# Patient Record
Sex: Male | Born: 1940
Health system: Southern US, Community
[De-identification: ages and names within clinical notes are randomized; demographics above are authoritative.]

## PROBLEM LIST (undated history)

## (undated) DIAGNOSIS — I10 Essential (primary) hypertension: Secondary | ICD-10-CM

## (undated) DIAGNOSIS — M359 Systemic involvement of connective tissue, unspecified: Secondary | ICD-10-CM

## (undated) DIAGNOSIS — I2510 Atherosclerotic heart disease of native coronary artery without angina pectoris: Secondary | ICD-10-CM

## (undated) DIAGNOSIS — E785 Hyperlipidemia, unspecified: Secondary | ICD-10-CM

## (undated) DIAGNOSIS — I4892 Unspecified atrial flutter: Secondary | ICD-10-CM

## (undated) DIAGNOSIS — C61 Malignant neoplasm of prostate: Secondary | ICD-10-CM

## (undated) DIAGNOSIS — M1990 Unspecified osteoarthritis, unspecified site: Secondary | ICD-10-CM

## (undated) HISTORY — DX: Hyperlipidemia, unspecified: E78.5

## (undated) HISTORY — PX: APPENDECTOMY: SHX54

## (undated) HISTORY — DX: Essential (primary) hypertension: I10

## (undated) HISTORY — DX: Atherosclerotic heart disease of native coronary artery without angina pectoris: I25.10

## (undated) HISTORY — DX: Malignant neoplasm of prostate: C61

## (undated) HISTORY — PX: TONSILLECTOMY: SUR1361

## (undated) HISTORY — DX: Unspecified atrial flutter: I48.92

## (undated) NOTE — *Deleted (*Deleted)
Pharmacy Student Note: For Learning Purposes ONLY. Not an active part of the chart.   Chief Complaint:  Patient presented to ED with worsening C. Diff infection associated diarrhea resulting in septic shock. While in ED, patient experienced a cardiac arrest in V. Fib which required 2 shocks, amiodarone, and intubation. PMH: CAD, atrial flutter, CKD, HTN, HLD, protstate cancer, hx CDI PSH: CABG 2000, NST 2019  Recommendations:   Admit Complaint: Anticoagulation- Heparin lvl  0.58 > cont 300 units/hr; A fib  Infectious Disease: CDI (fulminant; colitis)  -Prolonged course for fulminat, recurrent CDI Vancomycin oral solution (day 6 on 11/11 duration at least 10 days or longer depending on timing of history of CDI) at 500 mg daily Q6 hr and metronidazole 50 mg Q8HR -deciding on LOT today and will update stop dates -WBC increased today 13.7>20>29  Cardiovascular Maintain MAP > 65 11/9 MAPs in 70-80 range -Levophed and vasopressin running  -BP has not been charted today (if can maintain higher BP for IHD)  > 152 yest morning  Endocrinology: glucose control -Serum glucoses ~140-180 -gluc 217 11/11 AM  > D10 ?   Gastrointestinal / Nutrition -diarrhea induced metbaolic acidosis. Tx for C. Diff. -AST/ALT: 4 (4,000/1,400) > today 1.21 (1400/1220) > decr -LFT's increased > holding amiodarone > LFTs decr > restarted amio -11/10 small stool  Neurology: RASS -2 (declined)  Midazolam 2-4 mg PRN fentanyl 250 mcg/hr > 250 mcg/hr > 225 mcg/hr   Nephrology: severe oliguric ATN secondary to cardiac arrest and shock -dose meds for GFR -weight monitor daily -foley catheter -On CRRT (keep even, no a/c to start; Prismasol 4/2.5) > continuing UF 200 cc/hr because still volume overloaded on exam  Electrolytes: Prismasol 4/2.5 -Na 134 -K 4.6 > 4.9> 4.2 today -Mag 2.6 --> 2.5 > didn't get level today or yesterday -Phos  2.4 > 3.0 --- 20 mmol K-Phos Ca 8.1 Alb 2.8 - ionized Ca 1.05> gave 2 g  calcium  yest > 8.3>8.1       tx for Ca<1       Nonanion gap metabolic acidosis likely due to diarrhea Bicarb 12 --> 15.7 11/7 --> 22.6 Bicarb 125 cc/h --> decreased to 75 due to bicarb 24 11/7 --> continue to montior  Hyponatremia/hypochloremia due to free water retention and loss from diarrhea -Na 129 -- low --> 132 11/8 (better) > 135 improving (prismasol)> 133 -Cl 94 --> 100 > 102 increased again today >102    Hypoalbuminemia  Albumin 1.8 (increased) > 2.0 -25 g 25% albumin replaced today TID   PTA Medication Issues Acetaminophen 325 mg take 2 tabs Q6HR for back pain Eliquis 5 mg 1 tab BID (time of last dose before presentation to ED unknown) Rosuvastatin 40 mg 1 tab after supper B complex vitamins 1 capsule daily Calcium-Magnesium-VitD (Ca 1200+D3) 1 tab daily Cholecalciferol 25 mcg 1000 units- take 10,000 units daily Flonase 1 spray daily Metoprolol tartrate 25 mg take 3 tabs (75 mg) BID MVM NTG 0.4 mg Prednisone 5 mg BID with meal (for prostate cancer) Triamcinolone cream 0.1% Zytiga 250 mg take 4 tablets QAM   Inpatient meds Aspirin 162 mg daily per tube Chlorhexidine gluconate 15 mL BID D10 Heparin 5,000 units  Hydrocortisone sodium succinate IV injection 50 mg Q6HR --> renal dosing ok Insulin sSSI Insulin 3 units Q4HR Medline mouth rinse Pantoprazole 40 mg Q24 hours IV Vancomycin 500 mg/mL oral solution   Continuous meds prismasol 4/2.5 infusion NS infusion 10-20 mL/hr Albumin 25 g --> D/Cd Amiodarone 30  mg/hr Calcium gluconate --> D/Cd Dobutamine --> D/Cd Fentanyl 2500 mcg 10 mcg/mL Metronidazole 500 mg Q8HR --> stopped 11/5 1000 Norepinephrine 16 mg in 250 mL Zosyn --> D/Cd Sodium bicarb in D5W  PRN meds Midazolam 2-5 mg    Best Practices    Elliot Gurney, Lucienne Minks PharmD Student

## (undated) NOTE — *Deleted (*Deleted)
Pharmacy Resident Rounding Note - for learning purposes only, not an active part of the chart  S/o  Admit Complaint: Hypoxic resp failure req mechanical ventilation, septic shock d/t c.diff -started CRRT on 11/7 for AKI  -300 net UF  -consider d/c or reducing flagyl by 50% for liver dysfxn   Anticoagulation - heparin gtt Infectious Disease -vancomycin PO for cdiff day 4 Cardiovascular - amio gtt d/cd (elevated LFTs) - now in NSR  -vasopressin @0 .03, NE gtt @5 . MAP 79, SBP 100-120, HR 90s  Endocrinology - d10 Gastrointestinal / Nutrition-TF  -AST/ALT elevated, 4400/1400 Neurology -fent gtt @250 >>225 Nephrology CRRT - 4/2.5  -5.7L past 24 hours, Net +16L No UOP Hgb ~11, plt 179  Pulmonary Hematology / Oncology PTA Medication Issues Best Practices  A/p  AKI requiring CRRT -f/u fluid removal, watch lytes  -correct calcium? Add bicarb?  C.diff colitis / septic shock -f/u vasopressor wean -cont abx   Intermittent afib -in NSR, amio dcd -cont hep, may need to go to cath

---

## 1999-01-30 HISTORY — PX: CORONARY ARTERY BYPASS GRAFT: SHX141

## 1999-02-10 ENCOUNTER — Inpatient Hospital Stay (HOSPITAL_COMMUNITY): Admission: EM | Admit: 1999-02-10 | Discharge: 1999-02-16 | Payer: Self-pay | Admitting: Emergency Medicine

## 1999-02-12 ENCOUNTER — Encounter: Payer: Self-pay | Admitting: Cardiovascular Disease

## 1999-02-13 ENCOUNTER — Encounter: Payer: Self-pay | Admitting: Cardiovascular Disease

## 1999-02-14 ENCOUNTER — Encounter: Payer: Self-pay | Admitting: Cardiothoracic Surgery

## 1999-03-05 ENCOUNTER — Inpatient Hospital Stay (HOSPITAL_COMMUNITY): Admission: AD | Admit: 1999-03-05 | Discharge: 1999-03-08 | Payer: Self-pay | Admitting: Cardiovascular Disease

## 1999-03-06 ENCOUNTER — Encounter: Payer: Self-pay | Admitting: Cardiovascular Disease

## 2001-07-11 ENCOUNTER — Other Ambulatory Visit: Admission: RE | Admit: 2001-07-11 | Discharge: 2001-07-11 | Payer: Self-pay | Admitting: Urology

## 2002-08-01 HISTORY — PX: PROSTATECTOMY: SHX69

## 2003-04-01 ENCOUNTER — Other Ambulatory Visit: Admission: RE | Admit: 2003-04-01 | Discharge: 2003-04-01 | Payer: Self-pay | Admitting: Urology

## 2003-04-30 ENCOUNTER — Ambulatory Visit (HOSPITAL_COMMUNITY): Admission: RE | Admit: 2003-04-30 | Discharge: 2003-04-30 | Payer: Self-pay | Admitting: Radiation Oncology

## 2003-05-05 ENCOUNTER — Encounter: Payer: Self-pay | Admitting: Urology

## 2003-05-05 ENCOUNTER — Ambulatory Visit (HOSPITAL_COMMUNITY): Admission: RE | Admit: 2003-05-05 | Discharge: 2003-05-05 | Payer: Self-pay | Admitting: Urology

## 2003-06-04 ENCOUNTER — Inpatient Hospital Stay (HOSPITAL_COMMUNITY): Admission: RE | Admit: 2003-06-04 | Discharge: 2003-06-10 | Payer: Self-pay | Admitting: Urology

## 2003-07-18 ENCOUNTER — Emergency Department (HOSPITAL_COMMUNITY): Admission: EM | Admit: 2003-07-18 | Discharge: 2003-07-18 | Payer: Self-pay | Admitting: *Deleted

## 2003-09-15 ENCOUNTER — Other Ambulatory Visit: Admission: RE | Admit: 2003-09-15 | Discharge: 2003-09-15 | Payer: Self-pay | Admitting: Dermatology

## 2004-11-17 ENCOUNTER — Ambulatory Visit (HOSPITAL_COMMUNITY): Admission: RE | Admit: 2004-11-17 | Discharge: 2004-11-17 | Payer: Self-pay | Admitting: Internal Medicine

## 2004-11-17 ENCOUNTER — Ambulatory Visit: Payer: Self-pay | Admitting: Internal Medicine

## 2005-06-30 ENCOUNTER — Ambulatory Visit (HOSPITAL_COMMUNITY): Admission: RE | Admit: 2005-06-30 | Discharge: 2005-06-30 | Payer: Self-pay | Admitting: Cardiovascular Disease

## 2005-10-18 ENCOUNTER — Encounter (HOSPITAL_COMMUNITY): Admission: RE | Admit: 2005-10-18 | Discharge: 2005-11-17 | Payer: Self-pay | Admitting: *Deleted

## 2005-11-03 ENCOUNTER — Encounter (HOSPITAL_COMMUNITY): Admission: RE | Admit: 2005-11-03 | Discharge: 2006-02-01 | Payer: Self-pay | Admitting: *Deleted

## 2005-11-15 ENCOUNTER — Ambulatory Visit: Admission: RE | Admit: 2005-11-15 | Discharge: 2006-01-24 | Payer: Self-pay | Admitting: *Deleted

## 2007-10-29 ENCOUNTER — Inpatient Hospital Stay (HOSPITAL_COMMUNITY): Admission: EM | Admit: 2007-10-29 | Discharge: 2007-10-31 | Payer: Self-pay | Admitting: Emergency Medicine

## 2007-10-30 ENCOUNTER — Encounter (INDEPENDENT_AMBULATORY_CARE_PROVIDER_SITE_OTHER): Payer: Self-pay | Admitting: General Surgery

## 2010-12-14 NOTE — Discharge Summary (Signed)
NAME:  Samuel Gregory, BARREN NO.:  [id number]   MEDICAL RECORD NO.:  [id number]          PATIENT TYPE:  INP   LOCATION:  A314                          FACILITY:  [facility]   PHYSICIAN:  Tilford Pillar, MD      DATE OF BIRTH:  09-Dec-1940   DATE OF ADMISSION:  10/29/2007  DATE OF DISCHARGE:  04/01/2009LH                               DISCHARGE SUMMARY   ADMISSION DIAGNOSES:  Acute appendicitis.   DISCHARGE DIAGNOSIS:  1. Status post laparoscopic appendectomy.  2. Urethral stricture status post urethral dilatation and Foley      catheter placement per Dr. Jerre Simon.  3. Coronary artery disease.  4. Hypertension.  5. History of prostate cancer.   ADMITTING SURGEON:  Dr. Tilford Pillar.   CONSULTATIONS:  1. Dr. Jerre Simon.   PROCEDURES:  1. Laparoscopic appendectomy.  2. Urethral dilatation and Foley catheter placement.   DISPOSITION:  Home.   BRIEF H&P:  Please see the admission history and physical for complete  H&P.  The patient is a 70 year old male who presented to [facility] with increasing abdominal pain with localization to the right  lower quadrant.  He was evaluated initially by the emergency room  physician who obtained a CT evaluation, which confirmed the presence of  acute appendicitis.  He was admitted for the planned management and her  intervention of his acute appendicitis.   HOSPITAL COURSE:  The patient was admitted on October 29, 2007 near  midnight.  He was resuscitated with IV fluid hydration and IV  antibiotics.  He was subsequently taken to the operating room for the  planned laparoscopic, possible open appendectomy.  Due to a urethral  stricture Foley catheter why need was placed by consultation from  urology.  He tolerated both procedures extremely well.  Spent a brief  period in the postanesthetic care unit and was transferred back to the  his hospital bed on 3A.  His recovery was uneventful.  He tolerated a  regular diet, ambulatory.  Pain  was controlled with minimal use of  narcotics.  Foley catheter was removed on the morning of April 1.  Upon  voiding the patient was discharged home.   DISCHARGE INSTRUCTIONS:  The patient is to follow up in my office in 3  weeks.  He is to follow up with Dr. Jerre Simon on an as-needed basis.  He is  instructed to continue a regular heart healthy diet.   WOUND CARE:  He is to wash the wounds with soap and water.  He may  shower.  He is not to bathe for the next 2 to 3 weeks.  He may increase  activity slowly.  He may walk up steps.  He is not to lift anything  greater than 20 pounds for the next 4 weeks.  He is not to drive while  taking any narcotic pain medication or if he does not have significant  comfort to drive.   DISCHARGE MEDICATIONS:  1. He is to resume all previously prescribed home medications      including Vytorin 10/80 mg p.o.  daily, Altace 5 mg p.o. daily,      aspirin 325 mg p.o. daily, Toprol XL 12.5 mg p.o. daily,      multivitamin 1 tablet p.o. daily, flaxseed oil 1 tablet p.o. daily.  2. Additionally, he is being prescribed Vicodin 5/500 one to two      orally every 4 hours as needed for pain.      Tilford Pillar, MD  Electronically Signed     BZ/MEDQ  D:  10/31/2007  T:  10/31/2007  Job:  [id number]   cc:   [facility]

## 2010-12-14 NOTE — Op Note (Signed)
NAME:  [name], [name] NO.:  [id number]   MEDICAL RECORD NO.:  [id number]          PATIENT TYPE:  INP   LOCATION:  A314                          FACILITY:  [facility]   PHYSICIAN:  Ky Barban, M.D.DATE OF BIRTH:  03-Sep-1940   DATE OF PROCEDURE:  10/29/2007  DATE OF DISCHARGE:                                PROCEDURE NOTE   I was called in the operating room to see this patient.  Mr. Samuel Gregory  is a 70 year old gentleman well known to me.  He had radical retropubic  prostatectomy for carcinoma of the prostate and followed with  radiotherapy.  He is doing well from that standpoint.  His PSA is  negligible.  He also has a history of having developing bladder neck  stenosis after his surgery.  Today, he is here with acute appendicitis,  and they are trying to put a Foley catheter and cannot get in, so I was  called in to put a Foley catheter.  Urologically, there are no other  acute changes.   IMPRESSION:  Probably bladder neck stenosis.  Carcinoma of prostate  history.  I will go ahead and insert a Foley catheter after dilating the  bladder neck.   PREOP DIAGNOSIS:  Bladder neck stenosis.   POSTOPERATIVE DIAGNOSIS:  Bladder neck stenosis.   PROCEDURE:  Dilation of bladder neck with Sissy Hoff sounds, insertion of  a #18 Foley catheter.   PROCEDURE:  The patient has already general anesthesia after having a  laparoscopic appendectomy.  I came in and prepped and draped the private  area, and Xylocaine jelly was instilled into the urethra.  I tried a #18  Foley catheter.  It would not go in, so I used [employer].  Bladder neck dilated to a 22 [place] without any difficulty.  Then, I was  able to insert a #18 Foley catheter.  It is draining clear amber-colored  urine.  Foley catheter was left in, and at this point I left the  operating room.      Ky Barban, M.D.  Electronically Signed     MIJ/MEDQ  D:  10/30/2007  T:  10/30/2007  Job:   [id number]

## 2010-12-14 NOTE — Op Note (Signed)
NAME:  Samuel Gregory, Samuel Gregory NO.:  [id number]   MEDICAL RECORD NO.:  [id number]          PATIENT TYPE:  INP   LOCATION:  A314                          FACILITY:  [facility]   PHYSICIAN:  Tilford Pillar, MD      DATE OF BIRTH:  [birth year]   DATE OF PROCEDURE:  10/31/2007  DATE OF DISCHARGE:                               OPERATIVE REPORT   PREOPERATIVE DIAGNOSES:  Acute appendicitis.   POSTOPERATIVE DIAGNOSES:  1. Acute appendicitis.  2. Urethral stricture.   PROCEDURE:  1. Laparoscopic appendectomy.  2. Urethral dilatation and Foley catheter placement per Dr. Ky Barban.   SURGEON:  Dr. Tilford Pillar.   INTRAOPERATIVE SURGICAL CONSULTATION:  Dr. Ky Barban, for  urologic consultation.   ANESTHESIA:  General, local anesthetic with 0.5% Sensorcaine plain.   SPECIMEN:  Appendix.   ESTIMATED BLOOD LOSS:  Minimal.   COMPLICATIONS:  None.   INDICATIONS:  The patient is a 70 year old male who presented to [facility] with increasing abdominal pain with localization to the  right lower quadrant.  He did have a CT evaluation obtained by the  emergency room physician which demonstrated a dilated appendix with a  fecalith present.  He was prepared for the OR, resuscitated, risks,  benefits and alternatives of a laparoscopic, possible open appendectomy  were discussed at length with the patient including but not limited to  the risk of bleeding, infection, bowel injury as well as the possibility  of intraoperative pulmonary or cardiac episodes.  The likelihood of  conversion to an open procedure was discussed also with the patient  secondary to his history of prior radical prostatectomy.  The patient  understood the risks and benefits and wish to proceed at this time to  the operating room for an emergent appendectomy.   OPERATION:  The patient was taken emergently to the operating room for  the planned procedure.  He was placed in the supine  position on the  hospital bed at which time he was given a general anesthetic.  Once the  patient was asleep he was endotracheally intubated by Anesthesia.  It  should be noted that due to the presence of a anterior airway, the Glide  scope was required for successful intubation.  No difficulties were  obtained, using this technique.  Once the patient was asleep and  intubated, Foley catheter placement was attempted by myself using  sterile technique.  At this point a urethral stricture was encountered.  Several attempts were made with both a regular Foley catheter as well as  with a coude catheter, both 16-French and 18-French with no success.  At  this point additional attempts were ceased and a consultation with  Urology with Dr. Jerre Simon was obtained.  At this point the patient's  abdomen was prepped and draped in the usual fashion.  Due to the fact  that a Foley catheter was not able to be placed, plans were for a high  approach to the appendectomy.  Therefore a stab incision was created in  the  left subcostal region at Palmer's point.  The Veress needle was  inserted.  Once sufficient pneumoperitoneum was obtained, a 12 mm trocar  was placed in the midline approximately halfway between the xiphoid and  the umbilicus.  This was placed creating a stab incision and placement  of the trocar over the laparoscope allowing visualization of the trocar  entering into the peritoneal cavity.  Upon entering into the peritoneal  cavity, the inner cannula was removed.  The laparoscope was reinserted.  There is no evidence of any trocar or Veress needle placement injury.  The Veress needle was removed at this point.  At this time the remaining  trocars were placed.  A 5 mm trocar was placed just inferior to the  umbilicus, a 10 mL trocar was placed on the left lateral abdominal wall.  Both these trocars were placed in a similar fashion, creating a stab  incision and visualizing the trocar entering  the peritoneal cavity with  the laparoscope.  At this point it was noted there were a significant  number of adhesions within the peritoneal cavity.  The cecum was easily  identified.  There were some adhesions of the cecum to the anterior  abdominal wall.  These were divided sharply with Endoshears allowing  manipulation of the cecum.  The appendix was clearly identified.  It was  clearly indurated.  This was grasped with a regular grasper and lifted  anteriorly towards the abdominal wall allowing visualization of the  junction of the appendix and cecum.  A window was created in the base  appendix with a [place] dissector at the base of the appendix.  With  this window created an Endo vascular GIA 45 mm stapler was brought to  the field and was utilized to divide the mesoappendix.  At this point  the staple line was evaluated.  Hemostasis was excellent and attention  was turned to division of the base of the appendix.  Using a reload with  a regular Endo-GIA 45 stapler with a blue load, the base of the appendix  was divided.  The appendix was then free at this point.  This was placed  in an EndoCatch bag.  This was removed through the 12 mm trocar site.  At this point the trocar was reinserted at the 12 mm trocar site and  using a Endoclose suture passing device, a 2-0 Vicryl was passed through  both the 12 and the 11 mm trocar sites.  With these two  sutures in  place, the pneumoperitoneum was evacuated after once again visualizing  the staple lines and ensuring adequate hemostasis.  Both staple lines  were noted to be dry and at this time the pneumoperitoneum was  evacuated.  Trocars were removed.  The Vicryl sutures were secured.  Skin edges were reapproximated with a 4-0 Monocryl in a running  subcuticular suture at all three trocar sites.  The skin was then washed  and dried with a moist and dry towel.  Local anesthetic was instilled  into all around the area of all three trocar  sites and Dermabond was  placed over the incision.  At this point the initial drapes were removed  and attention was turned to placement of Foley catheter.   At this point intraoperative consultation by Dr. Jerre Simon was obtained.  Please see his dictation for the complete description of the procedure.  The penis was prepped again with the Betadine solution.  Drapes were  replaced and using sterile technique, urethral  dilators were utilized to  redilate the urethra and bladder neck.  A Foley catheter was placed  without difficulties upon completion of the dilatation.  Good urine  return was obtained.  The drapes were  removed.  The patient was allowed to come out of general anesthetic, was  transferred to a regular hospital bed and transferred to the  postanesthetic care unit in stable condition.  At the conclusion of the  procedure all instrument, sponge and needle counts were correct.  The  patient tolerated both procedures well.      Tilford Pillar, MD  Electronically Signed     BZ/MEDQ  D:  10/31/2007  T:  10/31/2007  Job:  [id number]   cc:   Ky Barban, M.D.  Fax: [phone]   [facility]

## 2010-12-14 NOTE — H&P (Signed)
NAME:  Samuel Gregory, Samuel Gregory NO.:  [id number]   MEDICAL RECORD NO.:  [id number]          PATIENT TYPE:  INP   LOCATION:  A314                          FACILITY:  [facility]   PHYSICIAN:  Tilford Pillar, MD      DATE OF BIRTH:  09/07/1940   DATE OF ADMISSION:  10/29/2007  DATE OF DISCHARGE:  LH                              HISTORY & PHYSICAL   CHIEF COMPLAINT:  Acute abdominal pain.   HISTORY OF PRESENT ILLNESS:  The patient is a 70 year old male with a  history of coronary artery disease, hypertension, and history of  prostate cancer who presents with approximately 2 days of increasing  abdominal pain.  This started insidiously in a diffuse fashion with  localization to the right lower quadrant as of the day progressed on  March 30.  He has had no similar symptoms.  He has had no sick contacts.  The pain has no significant exacerbating or relieving factors noted.  He  denies any current nausea and vomiting.  Questionable fever and chills.  No change in bowel movements, no hematochezia, no melena.  He has had no  dysuria, no hematuria.  Currently on evaluation his pain is relatively  controlled.   PAST MEDICAL HISTORY:  1. Coronary artery disease.  2. Hypertension.  3. Prostate cancer.   PAST SURGICAL HISTORY:  1. CABG.  2. Stents placed in 2004.  3. Radical prostatectomy.   MEDICATIONS:  1. Vytorin 1 mg p.o. daily.  2. Altace 5 mg p.o. daily.  3. Toprol XL 12.5 mg p.o. daily.  4. Aspirin 325 mg p.o. daily.   ALLERGIES:  SULFA which causes a rash.   SOCIAL HISTORY:  No tobacco use.  Occasional alcohol use.  No  recreational drug use.   REVIEW OF SYSTEMS:  CONSTITUTIONAL:  Unremarkable.  EYES:  Unremarkable.  EARS, NOSE, AND THROAT:  Unremarkable.  PULMONARY:  Unremarkable.  CARDIOVASCULAR:  No chest pain, no palpitations.  GASTROINTESTINAL:  As  per HPI.  GENITOURINARY: Unremarkable.  MUSCULOSKELETAL:  Unremarkable.  SKIN:  Unremarkable.  NEUROLOGIC:   Unremarkable.   PHYSICAL EXAMINATION:  VITAL SIGNS:  Temperature 98.7, T-max of 101.9,  heart rate 61, respirations 20, blood pressure 104/66.  GENERAL:  The patient is lying supine in a hospital bed in no acute  distress.  He is alert and oriented x3.  HEENT:  Pupils are equal, round, reacted to light.  Extraocular  movements are intact.  Oral mucosa is pink and moist.  NECK:  Trachea is midline.  No cervical lymphadenopathy is appreciated.  No thyroid nodules are noted.  PULMONARY:  Unlabored respirations.  He is clear to auscultation  bilaterally.  No wheezes are appreciated.  CARDIOVASCULAR:  Regular rate and rhythm.  He has 2+ radial pulses  bilaterally.  ABDOMEN:  Soft, flat.  He does have right lower quadrant pain at  McBurney's point. He does what appears to be Rovsing sign with referred  pain to the right lower quadrant with palpation of the left side. He has  no diffuse peritoneal signs. No hernias or masses  are elicited.  EXTREMITIES:  Warm and dry.   PERTINENT LABORATORY RADIOGRAPHIC STUDIES:  CBC:  White blood cell count  7.3, hemoglobin 13.0, hematocrit 36.7, platelets 138.  Basic metabolic  panel: Sodium 136, potassium 3.9, chloride 102, bicarb 28, BUN 12,  creatinine 1.20, blood glucose 166.  UA is within normal limits.  CT of  the abdomen and pelvis demonstrates no evidence of free air or free  intraperitoneal fluid.  There is some fat straining in the right lower  quadrant around the pericecal region.  There also appears to be a fluid-  filled, dilated appendix with the presence of a fecalith within this.  No evidence of intraperitoneal rupture is noted.   ASSESSMENT AND PLAN:  Acute appendicitis.  At this point the risks,  benefits, and alternatives of a laparoscopic, possible open appendectomy  were discussed at length with the patient; including, but not limited to  the risk of bleeding, infection, staple line leak, as well as the  possibility of intraoperative  pulmonary cardiac episodes.  With the  patient's significant history of pelvic surgery including his  prostatectomy, it was discussed with the patient that there is a higher  chance of need for conversion.  However, attempts at laparoscopic  pressure are still being initiated with placement of the laparoscope and  evaluation of the abdomen for evaluation of scar tissue.  It was  discussed with the patient, however, that if it was not deemed safe or  possible to do it through the laparoscope, conversion to an open  appendectomy will be undertaken.  At this point continued IV fluid  hydration as well as continued IV antibiotics with Nuvance will be  continued as well as NPO status.      Tilford Pillar, MD  Electronically Signed     BZ/MEDQ  D:  10/30/2007  T:  10/30/2007  Job:  [id number]

## 2010-12-17 NOTE — Discharge Summary (Signed)
NAME:  Samuel Gregory, Samuel Gregory NO.:  [id number]   MEDICAL RECORD NO.:  [id number]                   PATIENT TYPE:  INP   LOCATION:  A319                                 FACILITY:  [facility]   PHYSICIAN:  Ky Barban, M.D.            DATE OF BIRTH:  [birth year]   DATE OF ADMISSION:  06/04/2003  DATE OF DISCHARGE:  06/10/2003                                 DISCHARGE SUMMARY   Samuel Gregory is a 70 year old gentleman with elevated PSA.  On needle biopsy  of the prostate is found to have carcinoma of the prostate and he is found  to have a high grade carcinoma of the prostate with a Gleason's score of 8.  His total PSA is below 10, it is only 6.2, so I have advised him to undergo  radical prostatectomy, although we discussed other alternative treatment.  He had radiotherapy consult.  The patient decided to have radical  prostatectomy, so the procedure, risks, complications, especially urinary  incontinence, erectile dysfunction and blood transfer was discussed.  He  agreed and underwent preoperative workup.  CBC, urinalysis, SMA7, and EKG  were done, which were essentially negative, within normal range so he was  taken to the operating room.  On June 04, 2003, underwent bilateral  pelvic node dissection with frozen sections which were negative.  A  retropubic prostatectomy was done.  During the procedure there was a rectal  puncture which was repaired, and the procedure was done under spinal and  general anesthesia.   ESTIMATED BLOOD LOSS:  1200 cc which was replaced with two units.   First postop day, the patient is in ICU for routine observation.  His blood  pressure 138/82, pulse 100 per minute, temperature 98.3.  SUMP is 50 cc.  Foley catheter drained 200 cc, ureteral catheter about 50 cc and NG output  is 25 cc in the last two hours when I came to see him at 7:00 in the  morning.  His postoperative course is benign, satisfactory, and the first  postop day  he is afebrile, hematocrit 34.4, WBC count 8.4, sodium 137,  potassium 4, chloride 107, CO2 is 29, BUN is 11, creatinine is 1.2.  Blood  pressure 147/86, temperature 102.9, pulse is 100.  Dressing is changed,  wound looks fine.  He had a satisfactory postop course but he needs  incentive spirometry.  A rise in temperature is from atelectasis so I  encouraged him to start using incentive spirometry.  Patient is also being  closely followed by his medical doctor, Dr. [name].  On the second postop day  his abdomen soft, bowel sounds are present but no flatus, dressing is clear,  intake 3496 cc, output 3870 cc.  SUMP is right, NG output 0, so we placed  him on out-of-bed NG.  He continued to do well and on June 07, 2003, I  discontinued his NG tube,  started him on a liquid diet.  Ureteral catheters  were discontinued and he was discharged from ICU.  Also, his SUMP was  discontinued.  On June 08, 2003, he is afebrile, his general status is  good, passing flatus, abdomen soft.  He is on a regular diet.  On June 09, 2003, general status is good, wound is healing up nicely.  Had bowel  movement.  IV was discontinued.  On June 10, 2003, temperature the night  before was 100.4, he was afebrile at that time so we are going to discharge  him home with Foley catheter and will take the stitches out in the office  along with Foley catheter.  I want to keep the Foley catheter for about two  more weeks.  His final path report is back.  Lymph nodes, as I said, were  negative from both sides, both obturator and pelvic, and the specimen of  prostate and seminal vesicles shows Gleason's score is 3+3=6.  This is  notable because his biopsy Gleason score was 8.  Tumor extending to through  the capsule margin.  Tumor is not present on the ink margin of the urethra  or bladder or vesicle.  Seminal vesicles are uninvolved.  T2C, N0, MX.   FINAL DIAGNOSIS:  Carcinoma prostate.   DISCHARGE CONDITION:   Improved.  He is being discharged on his usual  medication which is Toprol, Lipitor, Altace and Levaquin plus Percocet.  I  will see him back next week in the office to remove the stitches.                                                Ky Barban, M.D.    MIJ/MEDQ  D:  07/29/2003  T:  07/29/2003  Job:  [id number]   cc:   [name]. [name], M.D.  [address]  [place]  [place] [address]  Fax: [phone]

## 2010-12-17 NOTE — H&P (Signed)
NAME:  Samuel Gregory, Samuel Gregory NO.:  [id number]   MEDICAL RECORD NO.:  [id number]                   PATIENT TYPE:  AMB   LOCATION:  DAY                                  FACILITY:  [facility]   PHYSICIAN:  Dennie Maizes, M.D.                DATE OF BIRTH:  11-15-1940   DATE OF ADMISSION:  06/05/2003  DATE OF DISCHARGE:                                HISTORY & PHYSICAL   CHIEF COMPLAINT:  Carcinoma of the prostate.   HISTORY OF PRESENT ILLNESS:  This 70 year old male was referred to me from  [facility] in 2000 with an elevated PSA of 4.3.  With  antibiotic therapy his PSA came down to normal range.  His PSA was again  elevated in 2002. Biopsy at that time revealed no evidence of malignancy.  Recent evaluation in the office revealed a prostate nodule with an elevated  PSA of 6.23 ng/ml.  The patient underwent transrectal ultrasound and 12-core  biopsy of the prostate in the office.  This revealed high grade carcinoma of  the prostate with a Gleason score of 5+3=8.  Metastatic workup was negative.   The patient denies having any voiding difficulty, gross hematuria,  urolithiasis, or recurrent urinary tract infection.  He has a good urinary  stream and urinary frequency x 4-5 and nocturia x0-1.   PAST MEDICAL HISTORY:  1. Coronary artery disease status post coronary artery bypass grafting x4 in     2000.  2. Hypercholesterolemia.   MEDICATIONS:  1. Toprol XL 25 mg, one p.o. daily.  2. Lipitor 40 mg, one p.o. daily.  3. Altace 2.5 mg, one p.o. daily.  4. Aspirin 325 mg, one p.o. daily, which has been discontinued for the     surgery.   ALLERGIES:  SULFA.   FAMILY HISTORY:  Family history is positive for coronary artery disease.  His mother had mesothelioma of the chest.   PHYSICAL EXAMINATION:  HEENT:  Head, eyes, ears and throat normal.  NECK:  No masses.  LUNGS:  Lungs clear to auscultation.  HEART:  Regular rate and rhythm.  No  murmurs.  ABDOMEN:  Abdomen soft, no palpable flank masses.  No CVA tenderness.  Bladder not palpable.  GENITALIA:  Penis and testes are normal.  RECTAL:  There is a 36 g size benign prostate with firmness and nodularity  of the right lateral lobe.   IMPRESSION:  Carcinoma of the prostate.   PLAN:  I have discussed with the patient regarding the diagnosis and  management options.  The patient has seen Dr. Dayton Scrape in consultation.  He  has decided to undergo radical prostatectomy.  The patient was seen by Dr.  Jerre Simon in my presence and he has been counseled regarding radical  prostatectomy and possible complications.  Please refer to Dr. Rudean Haskell  dictated notes.     ___________________________________________  Dennie Maizes, M.D.   SK/MEDQ  D:  06/03/2003  T:  06/03/2003  Job:  [id number]   cc:   [name]. [name], M.D.  [address]  [place]  [place] [address]  Fax: [phone]

## 2010-12-17 NOTE — Op Note (Signed)
NAME:  Samuel Gregory, Samuel Gregory                  ACCOUNT NO.:  [id number]   MEDICAL RECORD NO.:  [id number]          PATIENT TYPE:  AMB   LOCATION:  DAY                           FACILITY:  [facility]   PHYSICIAN:  Lionel December, M.D.    DATE OF BIRTH:  11-19-1940   DATE OF PROCEDURE:  11/17/2004  DATE OF DISCHARGE:                                 OPERATIVE REPORT   PROCEDURE:  Colonoscopy.   INDICATION:  Samuel Gregory is a 70 year old Caucasian male who is here for screening  colonoscopy. Family history is negative for colorectal carcinoma. Procedures  were reviewed the patient, informed consent was obtained.   PREMEDICATION:  Demerol 50 milligrams IV, Versed 5 milligrams IV.   FINDINGS:  Procedure performed in endoscopy suite. The patient's vital signs  and O2 sat were monitored during procedure and remained stable. The patient  was placed left lateral position and rectal examination performed. No  abnormality noted on external or digital exam. Olympus videoscope was placed  in rectum and advanced under vision into sigmoid colon beyond. Preparation  was satisfactory. Somewhat redundant colon but by repeatedly withdrawing the  scope and using abdominal pressure was able to advance the scope into the  splenic flexure beyond. The scope was passed to cecum which was identified  by appendiceal orifice and ileocecal valve. Pictures taken for the record.  Valve was rather inconspicuous. As the scope was withdrawn colonic mucosa  was carefully examined. There was questionable polyp across and above  ileocecal valve.  The fold in this area was separated with cold biopsy  forceps and no polyp was noted.  Mucosa rest of the colon similarly was  normal. Rectal mucosa was also normal. Scope was retroflexed to examine  anorectal junction and hemorrhoids were noted above the dentate line.  Endoscope was straightened and withdrawn. The patient tolerated the  procedure well.   FINAL DIAGNOSIS:  Internal hemorrhoids,  otherwise normal colonoscopy.   RECOMMENDATIONS:  Standard instructions given.  Yearly Hemoccults. He may  consider next screening exam in 10 years from now.      NR/MEDQ  D:  11/17/2004  T:  11/17/2004  Job:  [id number]   cc:   Kirk Ruths, M.D.  [address]  [place]  [place] [address]  Fax: [phone]

## 2010-12-17 NOTE — Op Note (Signed)
NAME:  Samuel Gregory, Samuel Gregory NO.:  [id number]   MEDICAL RECORD NO.:  [id number]                   PATIENT TYPE:  INP   LOCATION:  IC06                                 FACILITY:  [facility]   PHYSICIAN:  Ky Barban, M.D.            DATE OF BIRTH:  Jan 16, 1941   DATE OF PROCEDURE:  06/04/2003  DATE OF DISCHARGE:                                 OPERATIVE REPORT   PREOPERATIVE DIAGNOSIS:  Carcinoma of the prostate.   POSTOPERATIVE DIAGNOSIS:  Carcinoma of the prostate.   PROCEDURES:  1. Bilateral pelvic lymph node dissection with frozen sections.  2. Radical retropubic prostatectomy.  3. Repair of rectal tear.   ANESTHESIA:  Spinal plus general.   SURGEON:  Ky Barban, M.D.   ASSISTANT:  Dennie Maizes, M.D.   ESTIMATED BLOOD LOSS:  1200 mL.   REPLACEMENT:  Two units of packed cells.   COUNTS:  Instrument, needle, sponge count correct.   PATHOLOGY:  Frozen sections negative for metastatic disease.   DESCRIPTION OF PROCEDURE:  The patient was given general endotracheal  anesthesia along with spinal anesthesia, placed in semilithotomy position,  with the usual prep and drape. A 20-Foley catheter was introduced into the  bladder, appropriate midline incision is made, carried down through the  subcutaneous tissue. The rectus sheath is incised, recti separated in the  midline and retropubic space is entered. The right and left external iliac  veins were exposed.  A self-retaining Turner-Warwick retractor was applied.  I proceeded to do a node dissection on the right side, the fascia around the  external iliac vein is opened and the tissue is developed between the  external iliac vein and the obturator nerve. The fascia was skeletonized  along with the obturator nerve and vessels towards the bifurcation of the  right common iliac vein. The lymphatics were clipped and divided and  specimen was collected, sent for frozen section and this area was  left  bagged.   Similarly the nodes were dissected out on the left side and the report came  back from both sides negative for any metastatic disease.  We proceed to do  the radical prostatectomy. Working in the retropubic space, the  puboprostatic ligaments were divided and the pelvic fascia was opened on  both sides of the midline and through this endopelvic fascia, then a 2-0  Vicryl stitch was placed in the dorsal vein complex as distal as possible  and a second stitch was placed at the level of the apex of the prostate and  dorsal vein complex was divided.   The urethra was exposed and under direct vision a right angle was passed  behind the urethra and the interior wall of the urethra is divided. The  catheter was grabbed with the help of a long hemostat and divided and then  under direct vision the posterior wall of the urethra was divided.  The  traction on the Foley catheter lifted up the apex gently and pushed the  rectum along with the neurovascular bundles away from the prostate. This  area was then left bagged and the prostate was picked up more so that the  look on the posterior surface of the prostate and Denonvilliers fascia was  opened up at the level of the seminal vesicles. At this point, I find that  the rectum is very adherent to this area and I thought the seminal vesicles  were attached to the rectum along with the Denonvilliers fascia with blunt  dissection I made a small hole in the rectum which was immediately repaired  in two layers using 3-0 Vicryl and at the end of the case a third fascial  layer was placed with a continue stitch of 3-0 Vicryl covering this rectal  tear. At that time, the assistant had a finger in the rectum. Then the  seminal vesicles were identified, the fascia covering the seminal vesicle  was opened. The Visual Analog Scale deferens were identified, they were  clipped and divided. The seminal vesicles were quite adherent to the   surrounding sheath and with blunt and sharp dissection I was able to get  them out of the sheath by clipping the seminal vesicle artery on both of  them.   Once this dissection was completed, the superior pedicle of the prostate  first the right and then the left was ligated and divided. Now the bladder  neck was opened up in the interior midline, the bladder was opened and the  ureteral orifices were identified. A left #5 visceral tip catheter placed on  both and stabilized the ureteral catheters to the bladder with 4-0 chromic  stitch. Now the posterior bladder neck was divided under direct vision and  the remaining part of the seminal vesicle sheath from this side was divided  but both seminal vesicles also were divided during this procedure and the  specimen came out. Then I went back and removed the seminal vesicle  separately and the operative site was thoroughly irrigated, bleeders were  coagulated and clipped. There was good hemostasis. The bladder neck was then  fashioned by closing on the sides with 2-0 Vicryl stitch leaving it open the  size of my index finger. Once the bladder neck was fashioned then the  bladder neck was epithelialized with a continuous stitch of 4-0 chromic  everting the bladder mucosa and covering the bladder neck. One stitch on the  bladder neck on each side was left so that it can be tied down with the  urethral stump.   An Ivy procedure through the anastomosis on the left were removed. A 20-  Foley catheter was inserted into the urethra and came out through the  urethral stump and free tie was passed through the eye of the catheter and I  proceeded to do the anastomosis. Six stitches of 3-0 Vicryl were used, three  on each side. First on the left side, three stitches were placed and by the  stitch holding the tip of the catheter so that I can make the catheter go in and out of the stump to localize the stump. Three stitches were placed on  the right  side. When all the six stitches were in place, the balloon of the  catheter was checked again. The catheter was placed in the bladder and  inflated with 30 mL. The superior blade of the retractor was removed and the  bladder  was pushed into the retropubic space to approximate the ureteral  stump with the bladder neck. Holding the traction on the Foley balloon, all  the six stitches were tied in the sequence they were placed and then they  were also tied with the dorsal vein complex stitches and bladder neck  stitches. The bladder was irrigated thoroughly and no leak from the  anastomosis was seen.   It should be mentioned the patient was given 1 g of Ancef and 500 mg of  Flagyl during the operation and preoperatively he was given 400 mg Cipro. He  has lost about 1200 mL of blood that was replaced with two units of packed  cells. The hematocrit at the end of these two units was 33%. Now the  retropubic space was drained with a Shiley sump through a separate stab  wound and the ureteral catheters also came out through a separate cystostomy  along side this drain.  The ureteral catheters were stabilized to the skin  with a #0 silk stitch. The rectus sheath was  closed with continuous stitch of #0 Vicryl, subcutaneous tissue was  irrigated and skin was closed with clips. Sterile gauze dressing applied.  The Foley catheter was left on traction.   The patient left the operating room in satisfactory condition.      ___________________________________________                                            Ky Barban, M.D.   MIJ/MEDQ  D:  06/04/2003  T:  06/04/2003  Job:  [id number]   cc:   [name]. [name], M.D.  [address]  [place]  [place] [address]  Fax: [phone]

## 2010-12-17 NOTE — H&P (Signed)
NAME:  Samuel Gregory, Samuel Gregory NO.:  [id number]   MEDICAL RECORD NO.:  [id number]                   PATIENT TYPE:   LOCATION:                                       FACILITY:   PHYSICIAN:  Ky Barban, M.D.            DATE OF BIRTH:   DATE OF ADMISSION:  DATE OF DISCHARGE:                                HISTORY & PHYSICAL   CHIEF COMPLAINT:  Prostate cancer.   HISTORY OF PRESENT ILLNESS:  This is a 70 year old gentleman who had an  elevated PSA and recently had a prostate biopsy and ultrasound of the  prostate gland. He was found to have adenocarcinoma of the prostate which is  high grade Gleason score 5+38.  The patient was initially evaluated by Dr.  Rito Ehrlich and treatment options of radiation and radical prostatectomy were  discussed.  The patient was sent to the radiotherapist for his opinion.  The  patient comes back at this point to see me because he has decided that he  does not want to have radiation and wants to see if surgery can be done.  I  went over the surgical procedure and told him that his PSA was 6.2 and there  is a very good chance that as long as his PSA is below 10.0 that he does not  have metastatic disease.  I still went ahead and did a bone scan.  There was  some question about the lumbar spine.  I ordered plain x-rays and these just  show some arthritic changes, but there was no evidence of metastatic  disease.  I have had several meetings with the patient and with his wife.  I  told them that a radical prostatectomy can be done because of his age and  localized disease, but because of the high grade nature of this cancer he  may end up having a radical prostatectomy along with radiotherapy.  They  understand and would like for me to go ahead and proceed.  I have discussed  this in detail and made it clear to them the complications of a radical  prostatectomy which include:  1. Urinary incontinence that can be permanent, but  most of the time it will     resolve and he may not have to use any kind of device to collect the     urine.  2. Erectile dysfunction which will be permanent.  3. Use of blood in addition to any other complications from major surgery     like pulmonary embolism, infections, etc.   They understand and would like for me to go ahead and proceed.  The patient  is coming in as outpatient and we will do bilateral pelvic node dissection  and a radical retropubic prostatectomy.   PAST MEDICAL HISTORY:  1. A couple of years ago he had an elevated PSA.  A prostate biopsy at that  time was negative.  He continued to have an elevated PSA, so that is why     this biopsy was repeated.  2. Coronary artery disease and has had open heart surgery and is doing very     well with that.  As far as he has told me, his cardiologist has told him     that there is no cardiac problem to prevent the surgery.  3. No other significant medical problems.   FAMILY HISTORY:  Unremarkable.   ALLERGIES:  None.   SOCIAL HISTORY:  He does not smoke or drink.   REVIEW OF SYMPTOMS:  Unremarkable.  CARDIOPULMONARY:  He denies any chest  pain, orthopnea, PND.  GI:  He denies any nausea or vomiting.   PHYSICAL EXAMINATION:  VITAL SIGNS:  Blood pressure is 130/80, temperature  is normal.  CENTRAL NERVOUS SYSTEM:  Negative.  HEAD AND NECK:  Negative.  CHEST:  Clear.  HEART:  Regular sinus rhythm with no murmur.  ABDOMEN:  Soft and flat.  Liver, spleen and kidneys are not palpable. No  costovertebral angle tenderness.  EXTERNAL GENITALIA:  Unremarkable.  RECTAL EXAM:  The prostate is 2+, smooth and firm.  No rectal mass.   IMPRESSION:  1. Carcinoma of the prostate.  2. Coronary artery disease.   PLAN:  Bilateral pelvic node dissection with radical retropubic  prostatectomy and then admit him to the hospital.     ___________________________________________                                         Ky Barban, M.D.   MIJ/MEDQ  D:  06/03/2003  T:  06/03/2003  Job:  [id number]

## 2011-04-25 LAB — CBC
HCT: 36.7 — ABNORMAL LOW
HCT: 40.1
Hemoglobin: 14.2
MCHC: 35.4
MCV: 88.9
Platelets: 153
RBC: 4.11 — ABNORMAL LOW
RBC: 4.51
RDW: 12.6
RDW: 12.6
WBC: 7.3
WBC: 8.7

## 2011-04-25 LAB — COMPREHENSIVE METABOLIC PANEL
Alkaline Phosphatase: 49
CO2: 29
Calcium: 9.3
Chloride: 102
GFR calc Af Amer: >60
GFR calc non Af Amer: >60
Potassium: 4.1
Total Bilirubin: 0.8
Total Protein: 6.4

## 2011-04-25 LAB — DIFFERENTIAL
Basophils Absolute: 0
Basophils Absolute: 0.1
Basophils Relative: 0
Eosinophils Absolute: 0
Eosinophils Absolute: 0
Eosinophils Relative: 0
Lymphocytes Relative: 13
Lymphocytes Relative: 9 — ABNORMAL LOW
Lymphs Abs: 0.7
Lymphs Abs: 1.1
Monocytes Absolute: 0.5
Monocytes Absolute: 0.5
Neutro Abs: 7

## 2011-04-25 LAB — BASIC METABOLIC PANEL
Calcium: 8.5
Chloride: 102
Creatinine, Ser: 1.2
GFR calc non Af Amer: >60
Potassium: 3.9

## 2011-04-25 LAB — URINALYSIS, ROUTINE W REFLEX MICROSCOPIC
Bilirubin Urine: NEGATIVE
Glucose, UA: NEGATIVE
Hgb urine dipstick: NEGATIVE
Ketones, ur: NEGATIVE
Protein, ur: NEGATIVE
Specific Gravity, Urine: >1.03 — ABNORMAL HIGH
pH: 5

## 2011-04-25 LAB — LIPASE, BLOOD: Lipase: 23

## 2011-09-06 DIAGNOSIS — M25559 Pain in unspecified hip: Secondary | ICD-10-CM | POA: Diagnosis not present

## 2011-09-14 DIAGNOSIS — M25559 Pain in unspecified hip: Secondary | ICD-10-CM | POA: Diagnosis not present

## 2011-09-15 DIAGNOSIS — M5126 Other intervertebral disc displacement, lumbar region: Secondary | ICD-10-CM | POA: Diagnosis not present

## 2011-09-19 DIAGNOSIS — M25559 Pain in unspecified hip: Secondary | ICD-10-CM | POA: Diagnosis not present

## 2011-12-28 DIAGNOSIS — E785 Hyperlipidemia, unspecified: Secondary | ICD-10-CM | POA: Diagnosis not present

## 2011-12-28 DIAGNOSIS — R972 Elevated prostate specific antigen [PSA]: Secondary | ICD-10-CM | POA: Diagnosis not present

## 2011-12-28 DIAGNOSIS — I10 Essential (primary) hypertension: Secondary | ICD-10-CM | POA: Diagnosis not present

## 2011-12-28 DIAGNOSIS — I259 Chronic ischemic heart disease, unspecified: Secondary | ICD-10-CM | POA: Diagnosis not present

## 2012-02-13 DIAGNOSIS — D72819 Decreased white blood cell count, unspecified: Secondary | ICD-10-CM | POA: Diagnosis not present

## 2012-05-18 DIAGNOSIS — H52229 Regular astigmatism, unspecified eye: Secondary | ICD-10-CM | POA: Diagnosis not present

## 2012-05-18 DIAGNOSIS — H524 Presbyopia: Secondary | ICD-10-CM | POA: Diagnosis not present

## 2012-05-18 DIAGNOSIS — H4011X0 Primary open-angle glaucoma, stage unspecified: Secondary | ICD-10-CM | POA: Diagnosis not present

## 2012-05-18 DIAGNOSIS — H5200 Hypermetropia, unspecified eye: Secondary | ICD-10-CM | POA: Diagnosis not present

## 2012-06-08 DIAGNOSIS — H4011X0 Primary open-angle glaucoma, stage unspecified: Secondary | ICD-10-CM | POA: Diagnosis not present

## 2012-07-06 DIAGNOSIS — K219 Gastro-esophageal reflux disease without esophagitis: Secondary | ICD-10-CM | POA: Diagnosis not present

## 2012-07-06 DIAGNOSIS — E782 Mixed hyperlipidemia: Secondary | ICD-10-CM | POA: Diagnosis not present

## 2012-07-06 DIAGNOSIS — I2510 Atherosclerotic heart disease of native coronary artery without angina pectoris: Secondary | ICD-10-CM | POA: Diagnosis not present

## 2012-07-06 DIAGNOSIS — I10 Essential (primary) hypertension: Secondary | ICD-10-CM | POA: Diagnosis not present

## 2012-07-13 DIAGNOSIS — E782 Mixed hyperlipidemia: Secondary | ICD-10-CM | POA: Diagnosis not present

## 2012-07-13 DIAGNOSIS — Z79899 Other long term (current) drug therapy: Secondary | ICD-10-CM | POA: Diagnosis not present

## 2012-07-13 DIAGNOSIS — R5381 Other malaise: Secondary | ICD-10-CM | POA: Diagnosis not present

## 2012-10-30 ENCOUNTER — Encounter: Payer: Self-pay | Admitting: *Deleted

## 2012-11-13 DIAGNOSIS — D235 Other benign neoplasm of skin of trunk: Secondary | ICD-10-CM | POA: Diagnosis not present

## 2012-11-13 DIAGNOSIS — L570 Actinic keratosis: Secondary | ICD-10-CM | POA: Diagnosis not present

## 2012-11-13 DIAGNOSIS — L820 Inflamed seborrheic keratosis: Secondary | ICD-10-CM | POA: Diagnosis not present

## 2012-12-14 ENCOUNTER — Encounter: Payer: Self-pay | Admitting: Cardiovascular Disease

## 2013-01-07 ENCOUNTER — Ambulatory Visit (INDEPENDENT_AMBULATORY_CARE_PROVIDER_SITE_OTHER): Payer: Medicare Other | Admitting: Cardiovascular Disease

## 2013-01-07 ENCOUNTER — Encounter: Payer: Self-pay | Admitting: Cardiovascular Disease

## 2013-01-07 VITALS — BP 130/80 | HR 55 | Ht 65.0 in | Wt 154.0 lb

## 2013-01-07 DIAGNOSIS — I2510 Atherosclerotic heart disease of native coronary artery without angina pectoris: Secondary | ICD-10-CM

## 2013-01-07 DIAGNOSIS — I25810 Atherosclerosis of coronary artery bypass graft(s) without angina pectoris: Secondary | ICD-10-CM | POA: Diagnosis not present

## 2013-01-07 DIAGNOSIS — I10 Essential (primary) hypertension: Secondary | ICD-10-CM

## 2013-01-07 DIAGNOSIS — R5383 Other fatigue: Secondary | ICD-10-CM

## 2013-01-07 DIAGNOSIS — E785 Hyperlipidemia, unspecified: Secondary | ICD-10-CM | POA: Diagnosis not present

## 2013-01-07 DIAGNOSIS — R5381 Other malaise: Secondary | ICD-10-CM | POA: Diagnosis not present

## 2013-01-07 NOTE — Patient Instructions (Signed)
Your physician recommends that you return for lab work in about 1 month. You will need to be fasting for this lab work.  TSH, Free T3, Free T4, Lipids  Your physician recommends that you schedule a follow-up appointment in: 6 months

## 2013-01-23 ENCOUNTER — Encounter: Payer: Self-pay | Admitting: Cardiovascular Disease

## 2013-01-23 DIAGNOSIS — I10 Essential (primary) hypertension: Secondary | ICD-10-CM | POA: Insufficient documentation

## 2013-01-23 DIAGNOSIS — E782 Mixed hyperlipidemia: Secondary | ICD-10-CM | POA: Insufficient documentation

## 2013-01-23 DIAGNOSIS — I2510 Atherosclerotic heart disease of native coronary artery without angina pectoris: Secondary | ICD-10-CM | POA: Insufficient documentation

## 2013-01-23 NOTE — Progress Notes (Signed)
Patient ID: Samuel Gregory, male   DOB: 23-Apr-1941, 72 y.o.   MRN: [id number]     HPI: Samuel Gregory, is a 72 y.o. male who presents for six-month cardiology evaluation. He has established coronary artery disease in July 2000 underwent CABG surgery for multivessel CAD. One month later to 2 recurrent chest pain he underwent successful intervention to the graft supplying the diagonal vessel as well as the distal portion of the graft supplying the right coronary artery. Subsequently, he has done exceptionally well since that time the his last stress test was in September 2011 which continued to show normal perfusion.  Additional problems include hypertension, hyperlipidemia. He's been able to tolerate Crestor although the past he did develop some side effects with other statins.  He denies recent chest pain. He denies shortness of breath.  Past Medical History  Diagnosis Date  . CAD (coronary artery disease) 01/2000    CABG, 2D Echo 04/12/2011 demonstrated normal LV function and size with EF>55%  . Hypertension   . Hyperlipidemia     Past Surgical History  Procedure Laterality Date  . Coronary artery bypass graft  01/1999    x4 with a LIMA to the LAD vein to the diagonal, sequential vein to the PD and posterolateral branches of the right coronary artery.  . Cardiac catheterization  03/1999    which suggested stenosis in the graft to the diagonal vessel  . Cardiac catheterization  03/1999    coronary angioplasty stenting of the saphenous vien graft to the diagonal vessel with a percent diameter of reduction being reduced from 80-90% to 0%, difficult, complex, intervention in the sequential vein graft to the right coronary artery requiring multiple stents.    Allergies  Allergen Reactions  . Sulfa Antibiotics     Current Outpatient Prescriptions  Medication Sig Dispense Refill  . aspirin 325 MG tablet Take 325 mg by mouth daily.      . fish oil-omega-3 fatty acids 1000 MG capsule Take 1 g  by mouth daily.      . Flaxseed, Linseed, 1000 MG CAPS Take 1,000 mg by mouth daily.      . metoprolol succinate (TOPROL-XL) 25 MG 24 hr tablet Take 25 mg by mouth. Take 12.5 mg daily      . Multiple Vitamin (MULTIVITAMIN WITH MINERALS) TABS Take 1 tablet by mouth daily.      . ramipril (ALTACE) 5 MG tablet Take 5 mg by mouth daily.      . rosuvastatin (CRESTOR) 40 MG tablet Take 40 mg by mouth daily.       No current facility-administered medications for this visit.    Socially he is married. Has 2 children. 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 No tobacco use. He does drink occasional alcohol.  ROS negative for fever chills night sweats. He denies shortness of breath. He denies chest pressure. He denies PND orthopnea. He denies bleeding. He denies GI or GU symptoms. He denies significant edema. Other system review is negative for  PE BP 130/80  Pulse 55  Ht 5\' 5"  (1.651 m)  Wt 154 lb (69.854 kg)  BMI 25.63 kg/m2  General: Alert, oriented, no distress.  Skin: normal turgor, no rashes HEENT: Normocephalic, atraumatic. Pupils round and reactive; sclera anicteric;no lid lag,  Nose without nasal septal hypertrophy Mouth/Parynx benign; Mallinpatti scale 2 Neck: No JVD, no carotid briuts Lungs: clear to ausculatation and percussion; no wheezing or rales Heart: RRR, s1 s2 normal 1/6 systolic murmur.  Abdomen: soft, nontender; no hepatosplenomehaly, BS+; abdominal aorta nontender and not dilated by palpation. Pulses 2+ Extremities: no clubbing cyanosis or edema, Homan's sign negative  Neurologic: grossly nonfocal  ECG: Sinus rhythm at 55 beats per minute.  LABS:  BMET    Component Value Date/Time   NA 136 10/30/2007 0400   K 3.9 10/30/2007 0400   CL 102 10/30/2007 0400   CO2 28 10/30/2007 0400   GLUCOSE 166* 10/30/2007 0400   BUN 12 10/30/2007 0400   CREATININE 1.20 10/30/2007 0400   CALCIUM 8.5 10/30/2007 0400   GFRNONAA >60 10/30/2007 0400   GFRAA  Value: >60         The eGFR has been calculated using the MDRD equation. This calculation has not been validated in all clinical 10/30/2007 0400     Hepatic Function Panel     Component Value Date/Time   PROT 6.4 10/29/2007 2036   ALBUMIN 4.0 10/29/2007 2036   AST 21 10/29/2007 2036   ALT 23 10/29/2007 2036   ALKPHOS 49 10/29/2007 2036   BILITOT 0.8 10/29/2007 2036     CBC    Component Value Date/Time   WBC 7.3 10/30/2007 0400   RBC 4.11* 10/30/2007 0400   HGB 13.0 10/30/2007 0400   HCT 36.7* 10/30/2007 0400   PLT 138* 10/30/2007 0400   MCV 89.3 10/30/2007 0400   MCHC 35.4 10/30/2007 0400   RDW 12.6 10/30/2007 0400   LYMPHSABS 0.7 10/30/2007 0400   MONOABS 0.5 10/30/2007 0400   EOSABS 0.0 10/30/2007 0400   BASOSABS 0.0 10/30/2007 0400     BNP No results found for this basename: probnp    Lipid Panel  No results found for this basename: chol, trig, hdl, cholhdl, vldl, ldlcalc     RADIOLOGY: No results found.    ASSESSMENT AND PLAN: Mr. Waldrop continues to do exceptionally well now 14 years after his bypass surgery and subsequent coronary intervention to 2 of the grafts one month later. His last cholesterol was 173 triglycerides 72 and LDL 105 in December 2013. His LDL goal is less than 70and  if on repeat, this continues to still be elevated may be worthwhile to add Zetia 10 mg to his atorvastatin 40 mg for more optimal treatment. He sees Dr. Catalina Pizza for his primary care. I will see him in 6 months for cardiology followup evaluation.     Lennette Bihari, MD, [facility]  01/23/2013 11:13 PM

## 2013-02-15 ENCOUNTER — Other Ambulatory Visit: Payer: Self-pay | Admitting: *Deleted

## 2013-02-15 MED ORDER — RAMIPRIL 5 MG PO TABS: 5.0000 mg | ORAL_TABLET | Freq: Every day | ORAL | Status: DC

## 2013-03-01 DIAGNOSIS — R5381 Other malaise: Secondary | ICD-10-CM | POA: Diagnosis not present

## 2013-03-01 DIAGNOSIS — E785 Hyperlipidemia, unspecified: Secondary | ICD-10-CM | POA: Diagnosis not present

## 2013-03-01 LAB — LIPID PANEL
Cholesterol: 155 mg/dL (ref 0–200)
HDL: 49 mg/dL (ref >39)
LDL Cholesterol: 93 mg/dL (ref 0–99)
Total CHOL/HDL Ratio: 3.2 Ratio
VLDL: 13 mg/dL (ref 0–40)

## 2013-03-01 LAB — TSH: TSH: 3.841 u[IU]/mL (ref 0.350–4.500)

## 2013-03-01 LAB — T3, FREE: T3, Free: 2.8 pg/mL (ref 2.3–4.2)

## 2013-03-01 LAB — T4, FREE: Free T4: 1.3 ng/dL (ref 0.80–1.80)

## 2013-03-06 ENCOUNTER — Encounter: Payer: Self-pay | Admitting: *Deleted

## 2013-03-06 NOTE — Progress Notes (Signed)
Quick Note:    Letter sent to patient.  ______

## 2013-04-08 ENCOUNTER — Other Ambulatory Visit: Payer: Self-pay | Admitting: *Deleted

## 2013-04-08 MED ORDER — ROSUVASTATIN CALCIUM 40 MG PO TABS: 40.0000 mg | ORAL_TABLET | Freq: Every day | ORAL | Status: DC

## 2013-04-08 NOTE — Telephone Encounter (Signed)
Rx was sent to pharmacy electronically. 

## 2013-04-30 DIAGNOSIS — L570 Actinic keratosis: Secondary | ICD-10-CM | POA: Diagnosis not present

## 2013-05-15 DIAGNOSIS — Z23 Encounter for immunization: Secondary | ICD-10-CM | POA: Diagnosis not present

## 2013-07-15 ENCOUNTER — Other Ambulatory Visit: Payer: Self-pay | Admitting: *Deleted

## 2013-07-15 MED ORDER — METOPROLOL SUCCINATE ER 25 MG PO TB24: 25.0000 mg | ORAL_TABLET | Freq: Every day | ORAL | Status: DC

## 2013-08-15 DIAGNOSIS — M542 Cervicalgia: Secondary | ICD-10-CM | POA: Diagnosis not present

## 2013-09-06 DIAGNOSIS — I10 Essential (primary) hypertension: Secondary | ICD-10-CM | POA: Diagnosis not present

## 2013-09-06 DIAGNOSIS — M542 Cervicalgia: Secondary | ICD-10-CM | POA: Diagnosis not present

## 2013-09-11 DIAGNOSIS — M6281 Muscle weakness (generalized): Secondary | ICD-10-CM | POA: Diagnosis not present

## 2013-09-11 DIAGNOSIS — M542 Cervicalgia: Secondary | ICD-10-CM | POA: Diagnosis not present

## 2013-09-16 DIAGNOSIS — M542 Cervicalgia: Secondary | ICD-10-CM | POA: Diagnosis not present

## 2013-09-16 DIAGNOSIS — M6281 Muscle weakness (generalized): Secondary | ICD-10-CM | POA: Diagnosis not present

## 2013-09-19 DIAGNOSIS — M6281 Muscle weakness (generalized): Secondary | ICD-10-CM | POA: Diagnosis not present

## 2013-09-19 DIAGNOSIS — M542 Cervicalgia: Secondary | ICD-10-CM | POA: Diagnosis not present

## 2013-09-20 DIAGNOSIS — M542 Cervicalgia: Secondary | ICD-10-CM | POA: Diagnosis not present

## 2013-09-20 DIAGNOSIS — I10 Essential (primary) hypertension: Secondary | ICD-10-CM | POA: Diagnosis not present

## 2013-11-11 DIAGNOSIS — H4011X0 Primary open-angle glaucoma, stage unspecified: Secondary | ICD-10-CM | POA: Diagnosis not present

## 2013-11-11 DIAGNOSIS — H52229 Regular astigmatism, unspecified eye: Secondary | ICD-10-CM | POA: Diagnosis not present

## 2013-11-11 DIAGNOSIS — H409 Unspecified glaucoma: Secondary | ICD-10-CM | POA: Diagnosis not present

## 2013-11-11 DIAGNOSIS — H524 Presbyopia: Secondary | ICD-10-CM | POA: Diagnosis not present

## 2014-01-02 ENCOUNTER — Ambulatory Visit (INDEPENDENT_AMBULATORY_CARE_PROVIDER_SITE_OTHER): Payer: Medicare Other | Admitting: Otolaryngology

## 2014-01-02 DIAGNOSIS — H6980 Other specified disorders of Eustachian tube, unspecified ear: Secondary | ICD-10-CM | POA: Diagnosis not present

## 2014-01-02 DIAGNOSIS — H6120 Impacted cerumen, unspecified ear: Secondary | ICD-10-CM

## 2014-01-02 DIAGNOSIS — H903 Sensorineural hearing loss, bilateral: Secondary | ICD-10-CM | POA: Diagnosis not present

## 2014-03-14 ENCOUNTER — Other Ambulatory Visit: Payer: Self-pay | Admitting: Cardiovascular Disease

## 2014-03-14 NOTE — Telephone Encounter (Signed)
Rx refill sent to patient pharmacy   

## 2014-04-14 ENCOUNTER — Encounter: Payer: Self-pay | Admitting: Cardiology

## 2014-04-14 ENCOUNTER — Ambulatory Visit (INDEPENDENT_AMBULATORY_CARE_PROVIDER_SITE_OTHER): Payer: Medicare Other | Admitting: Cardiology

## 2014-04-14 VITALS — BP 138/82 | HR 89 | Ht 67.0 in | Wt 156.0 lb

## 2014-04-14 DIAGNOSIS — I2510 Atherosclerotic heart disease of native coronary artery without angina pectoris: Secondary | ICD-10-CM | POA: Diagnosis not present

## 2014-04-14 DIAGNOSIS — E782 Mixed hyperlipidemia: Secondary | ICD-10-CM

## 2014-04-14 DIAGNOSIS — I10 Essential (primary) hypertension: Secondary | ICD-10-CM | POA: Diagnosis not present

## 2014-04-14 MED ORDER — ROSUVASTATIN CALCIUM 40 MG PO TABS: 20.0000 mg | ORAL_TABLET | Freq: Every day | ORAL | Status: DC

## 2014-04-14 NOTE — Patient Instructions (Signed)
Your physician wants you to follow-up in: 1 year You will receive a reminder letter in the mail two months in advance. If you don't receive a letter, please call our office to schedule the follow-up appointment.   Your physician has recommended you make the following change in your medication:      DECREASE Crestor to 20 mg daily      Thank you for choosing [facility] !

## 2014-04-14 NOTE — Progress Notes (Signed)
Clinical Summary Mr. Duer is a 73 y.o.male presenting to the office for cardiology followup, a former patient of Dr. Claiborne Billings. This is our first meeting in the office. I reviewed his cardiac history.   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 Still works, reports no angina symptoms or unusual shortness of breath. Main complaint is of a feeling of leg achiness which he has attributed to Crestor. He does not describe claudication. His lipid panel from a year ago showed total cholesterol 155, triglycerides 67, HDL 49, and LDL 93. ECG today shows normal sinus rhythm.  Exercise Myoview in September 2012 demonstrated no diagnostic ST segment abnormalities at maximum workload of 10 METs, diaphragmatic attenuation without score or ischemia, LVEF 56%.   Allergies  Allergen Reactions  . Sulfa Antibiotics     Current Outpatient Prescriptions  Medication Sig Dispense Refill  . aspirin 325 MG tablet Take 325 mg by mouth daily.      . fish oil-omega-3 fatty acids 1000 MG capsule Take 1 g by mouth daily. Alternating from fish oil to flaxseed      . Flaxseed, Linseed, 1000 MG CAPS Take 1,000 mg by mouth daily.      . metoprolol succinate (TOPROL-XL) 25 MG 24 hr tablet Take 12.5 mg by mouth daily.      . Multiple Vitamin (MULTIVITAMIN WITH MINERALS) TABS Take 1 tablet by mouth daily.      . ramipril (ALTACE) 5 MG capsule TAKE (1) CAPSULE BY MOUTH ONCE DAILY.  30 capsule  0  . rosuvastatin (CRESTOR) 40 MG tablet Take 0.5 tablets (20 mg total) by mouth daily.  30 tablet  11   No current facility-administered medications for this visit.    Past Medical History  Diagnosis Date  . Coronary atherosclerosis of native coronary artery     Multivessel status post CABG 2000  . Essential hypertension, benign   . Hyperlipidemia     Past Surgical History  Procedure Laterality Date  . Coronary artery bypass graft  01/1999    LIMA to LAD, SVG to  diagonal, sequential SVG to PDA and PLB    Family History  Problem Relation Age of Onset  . Heart attack Mother   . Cancer Mother   . Aneurysm Father     Social History Mr. Husby reports that he has never smoked. He has never used smokeless tobacco. Mr. Coker reports that he does not drink alcohol.  Review of Systems No palpitations, dizziness, syncope. No orthopnea or PND. Other systems reviewed and negative except as outlined above.  Physical Examination Filed Vitals:   04/14/14 1553  BP: 138/82  Pulse: 89   Filed Weights   04/14/14 1553  Weight: 156 lb (70.761 kg)   Normally nourished male, appears comfortable at rest. HEENT: Conjunctiva and lids normal, oropharynx clear. Neck: Supple, no elevated JVP or carotid bruits, no thyromegaly. Lungs: Clear to auscultation, nonlabored breathing at rest. Cardiac: Regular rate and rhythm, no S3 or significant systolic murmur, no pericardial rub. Abdomen: Soft, nontender, bowel sounds present, no guarding or rebound. Extremities: No pitting edema, distal pulses 2+. Skin: Warm and dry. Musculoskeletal: No kyphosis. Neuropsychiatric: Alert and oriented x3, affect grossly appropriate.   Problem List and Plan   Coronary atherosclerosis of native coronary artery Systematically stable with history of multivessel disease status post CABG in 2000. ECG today is normal. Ischemic workup within the last 3 years was low  risk. Plan is to continue medical therapy and observation. Annual followup, sooner if needed. We can discuss followup ischemic testing within the next one to 2 years assuming he remains clinically stable.  Mixed hyperlipidemia Try and cut Crestor back to 20 mg daily to see if he notices any improvement in leg discomfort. Followup lipid panel with Dr. [place] Crane in 3 months.  Essential hypertension No change in antihypertensives.    Satira Sark, M.D., F.A.C.C.

## 2014-04-14 NOTE — Assessment & Plan Note (Signed)
Try and cut Crestor back to 20 mg daily to see if he notices any improvement in leg discomfort. Followup lipid panel with Dr. [place] Crane in 3 months.

## 2014-04-14 NOTE — Assessment & Plan Note (Signed)
Systematically stable with history of multivessel disease status post CABG in 2000. ECG today is normal. Ischemic workup within the last 3 years was low risk. Plan is to continue medical therapy and observation. Annual followup, sooner if needed. We can discuss followup ischemic testing within the next one to 2 years assuming he remains clinically stable.

## 2014-04-14 NOTE — Assessment & Plan Note (Signed)
No change in antihypertensives.

## 2014-04-15 ENCOUNTER — Other Ambulatory Visit: Payer: Self-pay | Admitting: Cardiovascular Disease

## 2014-05-07 DIAGNOSIS — H4011X2 Primary open-angle glaucoma, moderate stage: Secondary | ICD-10-CM | POA: Diagnosis not present

## 2014-05-29 DIAGNOSIS — Z23 Encounter for immunization: Secondary | ICD-10-CM | POA: Diagnosis not present

## 2014-06-18 DIAGNOSIS — Z1283 Encounter for screening for malignant neoplasm of skin: Secondary | ICD-10-CM | POA: Diagnosis not present

## 2014-06-18 DIAGNOSIS — L820 Inflamed seborrheic keratosis: Secondary | ICD-10-CM | POA: Diagnosis not present

## 2014-06-18 DIAGNOSIS — L570 Actinic keratosis: Secondary | ICD-10-CM | POA: Diagnosis not present

## 2014-06-18 DIAGNOSIS — X32XXXD Exposure to sunlight, subsequent encounter: Secondary | ICD-10-CM | POA: Diagnosis not present

## 2014-06-30 DIAGNOSIS — H40011 Open angle with borderline findings, low risk, right eye: Secondary | ICD-10-CM | POA: Diagnosis not present

## 2014-06-30 DIAGNOSIS — H25813 Combined forms of age-related cataract, bilateral: Secondary | ICD-10-CM | POA: Diagnosis not present

## 2014-06-30 DIAGNOSIS — H25812 Combined forms of age-related cataract, left eye: Secondary | ICD-10-CM | POA: Diagnosis not present

## 2014-06-30 DIAGNOSIS — H4011X4 Primary open-angle glaucoma, indeterminate stage: Secondary | ICD-10-CM | POA: Diagnosis not present

## 2014-07-07 NOTE — Patient Instructions (Signed)
Your procedure is scheduled on:  07/14/2014  Report to [facility] at    11:00  AM.  Call this number if you have problems the morning of surgery: [phone]   Remember:   Do not eat or drink :After Midnight.    Take these medicines the morning of surgery with A SIP OF WATER: Metoprolol and Ramipril   Do not wear jewelry, make-up or nail polish.  Do not wear lotions, powders, or perfumes. You may wear deodorant.  Do not shave 48 hours prior to surgery.  Do not bring valuables to the hospital.  Contacts, dentures or bridgework may not be worn into surgery.  Patients discharged the day of surgery will not be allowed to drive home.  Name and phone number of your driver:    Please read over the following fact sheets that you were given: Pain Booklet, Surgical Site Infection Prevention, Anesthesia Post-op Instructions and Care and Recovery After Surgery  Cataract Surgery  A cataract is a clouding of the lens of the eye. When a lens becomes cloudy, vision is reduced based on the degree and nature of the clouding. Surgery may be needed to improve vision. Surgery removes the cloudy lens and usually replaces it with a substitute lens (intraocular lens, IOL). LET YOUR EYE DOCTOR KNOW ABOUT:  Allergies to food or medicine.   Medicines taken including herbs, eyedrops, over-the-counter medicines, and creams.   Use of steroids (by mouth or creams).   Previous problems with anesthetics or numbing medicine.   History of bleeding problems or blood clots.   Previous surgery.   Other health problems, including diabetes and kidney problems.   Possibility of pregnancy, if this applies.  RISKS AND COMPLICATIONS  Infection.   Inflammation of the eyeball (endophthalmitis) that can spread to both eyes (sympathetic ophthalmia).   Poor wound healing.   If an IOL is inserted, it can later fall out of proper position. This is very uncommon.   Clouding of the part of your eye that holds an IOL in  place. This is called an "after-cataract." These are uncommon, but easily treated.  BEFORE THE PROCEDURE  Do not eat or drink anything except small amounts of water for 8 to 12 before your surgery, or as directed by your caregiver.   Unless you are told otherwise, continue any eyedrops you have been prescribed.   Talk to your primary caregiver about all other medicines that you take (both prescription and non-prescription). In some cases, you may need to stop or change medicines near the time of your surgery. This is most important if you are taking blood-thinning medicine.Do not stop medicines unless you are told to do so.   Arrange for someone to drive you to and from the procedure.   Do not put contact lenses in either eye on the day of your surgery.  PROCEDURE There is more than one method for safely removing a cataract. Your doctor can explain the differences and help determine which is best for you. Phacoemulsification surgery is the most common form of cataract surgery.  An injection is given behind the eye or eyedrops are given to make this a painless procedure.   A small cut (incision) is made on the edge of the clear, dome-shaped surface that covers the front of the eye (cornea).   A tiny probe is painlessly inserted into the eye. This device gives off ultrasound waves that soften and break up the cloudy center of the lens. This makes it  easier for the cloudy lens to be removed by suction.   An IOL may be implanted.   The normal lens of the eye is covered by a clear capsule. Part of that capsule is intentionally left in the eye to support the IOL.   Your surgeon may or may not use stitches to close the incision.  There are other forms of cataract surgery that require a larger incision and stiches to close the eye. This approach is taken in cases where the doctor feels that the cataract cannot be easily removed using phacoemulsification. AFTER THE PROCEDURE  When an IOL is  implanted, it does not need care. It becomes a permanent part of your eye and cannot be seen or felt.   Your doctor will schedule follow-up exams to check on your progress.   Review your other medicines with your doctor to see which can be resumed after surgery.   Use eyedrops or take medicine as prescribed by your doctor.  Document Released: 07/07/2011 Document Reviewed: 07/04/2011 [facility] Patient Information 2012 [place].  .Cataract Surgery Care After Refer to this sheet in the next few weeks. These instructions provide you with information on caring for yourself after your procedure. Your caregiver may also give you more specific instructions. Your treatment has been planned according to current medical practices, but problems sometimes occur. Call your caregiver if you have any problems or questions after your procedure.  HOME CARE INSTRUCTIONS   Avoid strenuous activities as directed by your caregiver.   Ask your caregiver when you can resume driving.   Use eyedrops or other medicines to help healing and control pressure inside your eye as directed by your caregiver.   Only take over-the-counter or prescription medicines for pain, discomfort, or fever as directed by your caregiver.   Do not to touch or rub your eyes.   You may be instructed to use a protective shield during the first few days and nights after surgery. If not, wear sunglasses to protect your eyes. This is to protect the eye from pressure or from being accidentally bumped.   Keep the area around your eye clean and dry. Avoid swimming or allowing water to hit you directly in the face while showering. Keep soap and shampoo out of your eyes.   Do not bend or lift heavy objects. Bending increases pressure in the eye. You can walk, climb stairs, and do light household chores.   Do not put a contact lens into the eye that had surgery until your caregiver says it is okay to do so.   Ask your doctor when you can  return to work. This will depend on the kind of work that you do. If you work in a dusty environment, you may be advised to wear protective eyewear for a period of time.   Ask your caregiver when it will be safe to engage in sexual activity.   Continue with your regular eye exams as directed by your caregiver.  What to expect:  It is normal to feel itching and mild discomfort for a few days after cataract surgery. Some fluid discharge is also common, and your eye may be sensitive to light and touch.   After 1 to 2 days, even moderate discomfort should disappear. In most cases, healing will take about 6 weeks.   If you received an intraocular lens (IOL), you may notice that colors are very bright or have a blue tinge. Also, if you have been in bright sunlight, everything  may appear reddish for a few hours. If you see these color tinges, it is because your lens is clear and no longer cloudy. Within a few months after receiving an IOL, these extra colors should go away. When you have healed, you will probably need new glasses.  SEEK MEDICAL CARE IF:   You have increased bruising around your eye.   You have discomfort not helped by medicine.  SEEK IMMEDIATE MEDICAL CARE IF:   You have a fever.   You have a worsening or sudden vision loss.   You have redness, swelling, or increasing pain in the eye.   You have a thick discharge from the eye that had surgery.  MAKE SURE YOU:  Understand these instructions.   Will watch your condition.   Will get help right away if you are not doing well or get worse.  Document Released: 02/04/2005 Document Revised: 07/07/2011 Document Reviewed: 03/11/2011 [facility] Patient Information 2012 [place].    Monitored Anesthesia Care  Monitored anesthesia care is an anesthesia service for a medical procedure. Anesthesia is the loss of the ability to feel pain. It is produced by medications called anesthetics. It may affect a small area of your body  (local anesthesia), a large area of your body (regional anesthesia), or your entire body (general anesthesia). The need for monitored anesthesia care depends your procedure, your condition, and the potential need for regional or general anesthesia. It is often provided during procedures where:   General anesthesia may be needed if there are complications. This is because you need special care when you are under general anesthesia.   You will be under local or regional anesthesia. This is so that you are able to have higher levels of anesthesia if needed.   You will receive calming medications (sedatives). This is especially the case if sedatives are given to put you in a semi-conscious state of relaxation (deep sedation). This is because the amount of sedative needed to produce this state can be hard to predict. Too much of a sedative can produce general anesthesia. Monitored anesthesia care is performed by one or more caregivers who have special training in all types of anesthesia. You will need to meet with these caregivers before your procedure. During this meeting, they will ask you about your medical history. They will also give you instructions to follow. (For example, you will need to stop eating and drinking before your procedure. You may also need to stop or change medications you are taking.) During your procedure, your caregivers will stay with you. They will:   Watch your condition. This includes watching you blood pressure, breathing, and level of pain.   Diagnose and treat problems that occur.   Give medications if they are needed. These may include calming medications (sedatives) and anesthetics.   Make sure you are comfortable.  Having monitored anesthesia care does not necessarily mean that you will be under anesthesia. It does mean that your caregivers will be able to manage anesthesia if you need it or if it occurs. It also means that you will be able to have a different type  of anesthesia than you are having if you need it. When your procedure is complete, your caregivers will continue to watch your condition. They will make sure any medications wear off before you are allowed to go home.  Document Released: 04/13/2005 Document Revised: 11/12/2012 Document Reviewed: 08/29/2012 [facility] Patient Information 2014 [place], [place].

## 2014-07-08 ENCOUNTER — Encounter (HOSPITAL_COMMUNITY)
Admission: RE | Admit: 2014-07-08 | Discharge: 2014-07-08 | Disposition: A | Discharge status: Home / Self Care | Payer: Medicare Other | Source: Ambulatory Visit | Attending: Ophthalmology | Admitting: Ophthalmology

## 2014-07-08 ENCOUNTER — Encounter (HOSPITAL_COMMUNITY): Payer: Self-pay

## 2014-07-08 DIAGNOSIS — Z01812 Encounter for preprocedural laboratory examination: Secondary | ICD-10-CM | POA: Diagnosis not present

## 2014-07-08 HISTORY — DX: Unspecified osteoarthritis, unspecified site: M19.90

## 2014-07-08 LAB — BASIC METABOLIC PANEL
ANION GAP: 8 (ref 5–15)
BUN: 26 mg/dL — ABNORMAL HIGH (ref 6–23)
CALCIUM: 9.3 mg/dL (ref 8.4–10.5)
CHLORIDE: 103 meq/L (ref 96–112)
CO2: 29 mEq/L (ref 19–32)
CREATININE: 1.49 mg/dL — AB (ref 0.50–1.35)
GFR calc non Af Amer: 45 mL/min — ABNORMAL LOW (ref >90)
GFR, EST AFRICAN AMERICAN: 52 mL/min — AB (ref >90)
Glucose, Bld: 101 mg/dL — ABNORMAL HIGH (ref 70–99)
Potassium: 4.5 mEq/L (ref 3.7–5.3)
Sodium: 140 mEq/L (ref 137–147)

## 2014-07-08 LAB — HEMOGLOBIN AND HEMATOCRIT, BLOOD
HEMATOCRIT: 41.7 % (ref 39.0–52.0)
HEMOGLOBIN: 14.2 g/dL (ref 13.0–17.0)

## 2014-07-08 NOTE — Pre-Procedure Instructions (Signed)
Patient given information to sign up for my chart at home. 

## 2014-07-11 ENCOUNTER — Telehealth: Payer: Self-pay | Admitting: *Deleted

## 2014-07-11 MED ORDER — PHENYLEPHRINE HCL 2.5 % OP SOLN
OPHTHALMIC | Status: AC
  Filled 2014-07-11: qty 15

## 2014-07-11 MED ORDER — TETRACAINE HCL 0.5 % OP SOLN
OPHTHALMIC | Status: AC
  Filled 2014-07-11: qty 2

## 2014-07-11 MED ORDER — LIDOCAINE HCL 3.5 % OP GEL
OPHTHALMIC | Status: AC
  Filled 2014-07-11: qty 1

## 2014-07-11 MED ORDER — CYCLOPENTOLATE-PHENYLEPHRINE OP SOLN OPTIME - NO CHARGE
OPHTHALMIC | Status: AC
  Filled 2014-07-11: qty 2

## 2014-07-11 MED ORDER — NEOMYCIN-POLYMYXIN-DEXAMETH 3.5-10000-0.1 OP SUSP
OPHTHALMIC | Status: AC
  Filled 2014-07-11: qty 5

## 2014-07-11 MED ORDER — LIDOCAINE HCL (PF) 1 % IJ SOLN
INTRAMUSCULAR | Status: AC
  Filled 2014-07-11: qty 2

## 2014-07-11 NOTE — Telephone Encounter (Signed)
Pt is already taking 1/2 tab of Crestor 40 mg for his 20 mg per day dose  LM for patient to call back

## 2014-07-11 NOTE — Telephone Encounter (Signed)
Pt wants [place] to call in crestor to [facility] he needs to get a rx that he can just break them in half so he doesn't have to pay so much out of pocket/tmj

## 2014-07-14 ENCOUNTER — Encounter (HOSPITAL_COMMUNITY): Payer: Self-pay | Admitting: *Deleted

## 2014-07-14 ENCOUNTER — Ambulatory Visit (HOSPITAL_COMMUNITY): Payer: Medicare Other | Admitting: Anesthesiology

## 2014-07-14 ENCOUNTER — Encounter (HOSPITAL_COMMUNITY)
Admission: RE | Disposition: A | Discharge status: Home / Self Care | Payer: Self-pay | Source: Ambulatory Visit | Attending: Ophthalmology

## 2014-07-14 ENCOUNTER — Ambulatory Visit (HOSPITAL_COMMUNITY)
Admission: RE | Admit: 2014-07-14 | Discharge: 2014-07-14 | Disposition: A | Discharge status: Home / Self Care | Payer: Medicare Other | Source: Ambulatory Visit | Attending: Ophthalmology | Admitting: Ophthalmology

## 2014-07-14 DIAGNOSIS — H259 Unspecified age-related cataract: Secondary | ICD-10-CM | POA: Diagnosis not present

## 2014-07-14 DIAGNOSIS — H25812 Combined forms of age-related cataract, left eye: Secondary | ICD-10-CM | POA: Diagnosis not present

## 2014-07-14 HISTORY — PX: CATARACT EXTRACTION W/PHACO: SHX586

## 2014-07-14 SURGERY — PHACOEMULSIFICATION, CATARACT, WITH IOL INSERTION
Anesthesia: Monitor Anesthesia Care | Site: Eye | Laterality: Left

## 2014-07-14 MED ORDER — LACTATED RINGERS IV SOLN
INTRAVENOUS | Status: DC
  Administered 2014-07-14: 12:00:00 via INTRAVENOUS

## 2014-07-14 MED ORDER — MIDAZOLAM HCL 2 MG/2ML IJ SOLN
1.0000 mg | INTRAMUSCULAR | Status: DC | PRN
  Administered 2014-07-14: 2 mg via INTRAVENOUS

## 2014-07-14 MED ORDER — BSS IO SOLN
INTRAOCULAR | Status: DC | PRN
  Administered 2014-07-14: 12:00:00

## 2014-07-14 MED ORDER — MIDAZOLAM HCL 2 MG/2ML IJ SOLN
INTRAMUSCULAR | Status: AC
  Filled 2014-07-14: qty 2

## 2014-07-14 MED ORDER — FENTANYL CITRATE 0.05 MG/ML IJ SOLN
25.0000 ug | INTRAMUSCULAR | Status: AC
  Administered 2014-07-14 (×2): 25 ug via INTRAVENOUS

## 2014-07-14 MED ORDER — FENTANYL CITRATE 0.05 MG/ML IJ SOLN
INTRAMUSCULAR | Status: AC
  Filled 2014-07-14: qty 2

## 2014-07-14 MED ORDER — PHENYLEPHRINE HCL 2.5 % OP SOLN
1.0000 [drp] | OPHTHALMIC | Status: AC
  Administered 2014-07-14 (×3): 1 [drp] via OPHTHALMIC

## 2014-07-14 MED ORDER — POVIDONE-IODINE 5 % OP SOLN
OPHTHALMIC | Status: DC | PRN
  Administered 2014-07-14: 1 via OPHTHALMIC

## 2014-07-14 MED ORDER — BSS IO SOLN
INTRAOCULAR | Status: DC | PRN
  Administered 2014-07-14: 30 mL via INTRAOCULAR

## 2014-07-14 MED ORDER — LACTATED RINGERS IV SOLN
INTRAVENOUS | Status: DC | PRN
  Administered 2014-07-14: 11:00:00 via INTRAVENOUS

## 2014-07-14 MED ORDER — CYCLOPENTOLATE-PHENYLEPHRINE 0.2-1 % OP SOLN
1.0000 [drp] | OPHTHALMIC | Status: AC
  Administered 2014-07-14 (×3): 1 [drp] via OPHTHALMIC

## 2014-07-14 MED ORDER — LIDOCAINE HCL (PF) 1 % IJ SOLN
INTRAMUSCULAR | Status: DC | PRN
  Administered 2014-07-14: .5 mL

## 2014-07-14 MED ORDER — TETRACAINE HCL 0.5 % OP SOLN
1.0000 [drp] | OPHTHALMIC | Status: AC
Start: 2014-07-14 — End: 2014-07-14
  Administered 2014-07-14 (×3): 1 [drp] via OPHTHALMIC

## 2014-07-14 MED ORDER — EPINEPHRINE HCL 1 MG/ML IJ SOLN
INTRAMUSCULAR | Status: AC
  Filled 2014-07-14: qty 1

## 2014-07-14 MED ORDER — LIDOCAINE 3.5 % OP GEL OPTIME - NO CHARGE
OPHTHALMIC | Status: DC | PRN
  Administered 2014-07-14: 1 [drp] via OPHTHALMIC

## 2014-07-14 MED ORDER — NEOMYCIN-POLYMYXIN-DEXAMETH 3.5-10000-0.1 OP SUSP
OPHTHALMIC | Status: DC | PRN
  Administered 2014-07-14: 1 [drp] via OPHTHALMIC

## 2014-07-14 MED ORDER — LIDOCAINE HCL 3.5 % OP GEL
1.0000 "application " | Freq: Once | OPHTHALMIC | Status: AC
  Administered 2014-07-14: 1 via OPHTHALMIC

## 2014-07-14 MED ORDER — PROVISC 10 MG/ML IO SOLN
INTRAOCULAR | Status: DC | PRN
  Administered 2014-07-14: 0.85 mL via INTRAOCULAR

## 2014-07-14 SURGICAL SUPPLY — 34 items
CAPSULAR TENSION RING-AMO (OPHTHALMIC RELATED) IMPLANT
CLOTH BEACON ORANGE TIMEOUT ST (SAFETY) ×2 IMPLANT
EYE SHIELD UNIVERSAL CLEAR (GAUZE/BANDAGES/DRESSINGS) ×2 IMPLANT
GLOVE BIO SURGEON STRL SZ 6.5 (GLOVE) IMPLANT
GLOVE BIO SURGEONS STRL SZ 6.5 (GLOVE)
GLOVE BIOGEL PI IND STRL 6.5 (GLOVE) IMPLANT
GLOVE BIOGEL PI IND STRL 7.0 (GLOVE) IMPLANT
GLOVE BIOGEL PI IND STRL 7.5 (GLOVE) IMPLANT
GLOVE BIOGEL PI INDICATOR 6.5 (GLOVE) ×4
GLOVE BIOGEL PI INDICATOR 7.0 (GLOVE)
GLOVE BIOGEL PI INDICATOR 7.5 (GLOVE)
GLOVE ECLIPSE 6.5 STRL STRAW (GLOVE) IMPLANT
GLOVE ECLIPSE 7.0 STRL STRAW (GLOVE) IMPLANT
GLOVE ECLIPSE 7.5 STRL STRAW (GLOVE) IMPLANT
GLOVE EXAM NITRILE LRG STRL (GLOVE) IMPLANT
GLOVE EXAM NITRILE MD LF STRL (GLOVE) IMPLANT
GLOVE SKINSENSE NS SZ6.5 (GLOVE)
GLOVE SKINSENSE NS SZ7.0 (GLOVE)
GLOVE SKINSENSE STRL SZ6.5 (GLOVE) IMPLANT
GLOVE SKINSENSE STRL SZ7.0 (GLOVE) IMPLANT
KIT VITRECTOMY (OPHTHALMIC RELATED) IMPLANT
PAD ARMBOARD 7.5X6 YLW CONV (MISCELLANEOUS) ×2 IMPLANT
PROC W NO LENS (INTRAOCULAR LENS)
PROC W SPEC LENS (INTRAOCULAR LENS)
PROCESS W NO LENS (INTRAOCULAR LENS) IMPLANT
PROCESS W SPEC LENS (INTRAOCULAR LENS) IMPLANT
RETRACTOR IRIS SIGHTPATH (OPHTHALMIC RELATED) IMPLANT
RING MALYGIN (MISCELLANEOUS) IMPLANT
SIGHTPATH CAT PROC W REG LENS (Ophthalmic Related) ×3 IMPLANT
SYRINGE LUER LOK 1CC (MISCELLANEOUS) ×2 IMPLANT
TAPE SURG TRANSPORE 1 IN (GAUZE/BANDAGES/DRESSINGS) IMPLANT
TAPE SURGICAL TRANSPORE 1 IN (GAUZE/BANDAGES/DRESSINGS) ×2
VISCOELASTIC ADDITIONAL (OPHTHALMIC RELATED) IMPLANT
WATER STERILE IRR 250ML POUR (IV SOLUTION) ×2 IMPLANT

## 2014-07-14 NOTE — Telephone Encounter (Signed)
Pt notified that we cannot falsify what the correct dose is. Pt will have lipids checked by Dr. Nevada Crane in the next 2 weeks and will forward results to Dr. Domenic Polite.

## 2014-07-14 NOTE — Anesthesia Postprocedure Evaluation (Signed)
  Anesthesia Post-op Note  Patient: Samuel Gregory  Procedure(s) Performed: Procedure(s) with comments: CATARACT EXTRACTION PHACO AND INTRAOCULAR LENS PLACEMENT (IOC) (Left) - CDE:6.20  Patient Location: Short Stay  Anesthesia Type:MAC  Level of Consciousness: awake, alert , oriented and patient cooperative  Airway and Oxygen Therapy: Patient Spontanous Breathing  Post-op Pain: none  Post-op Assessment: Post-op Vital signs reviewed, Patient's Cardiovascular Status Stable, Respiratory Function Stable, Patent Airway, No signs of Nausea or vomiting and Pain level controlled  Post-op Vital Signs: Reviewed and stable  Last Vitals:  Filed Vitals:   07/14/14 1155  BP: 103/64  Pulse:   Temp:   Resp:     Complications: No apparent anesthesia complications

## 2014-07-14 NOTE — Anesthesia Preprocedure Evaluation (Signed)
Anesthesia Evaluation  Patient identified by MRN, date of birth, ID band Patient awake    Reviewed: Allergy & Precautions, H&P , NPO status , Patient's Chart, lab work & pertinent test results  Airway Mallampati: II  TM Distance: >3 FB     Dental   Pulmonary neg pulmonary ROS,  breath sounds clear to auscultation        Cardiovascular hypertension, Pt. on medications - angina+ CAD and + CABG Rhythm:Regular Rate:Normal     Neuro/Psych    GI/Hepatic negative GI ROS,   Endo/Other    Renal/GU      Musculoskeletal  (+) Arthritis -,   Abdominal   Peds  Hematology   Anesthesia Other Findings   Reproductive/Obstetrics                             Anesthesia Physical Anesthesia Plan  ASA: III  Anesthesia Plan: MAC   Post-op Pain Management:    Induction: Intravenous  Airway Management Planned: Nasal Cannula  Additional Equipment:   Intra-op Plan:   Post-operative Plan:   Informed Consent: I have reviewed the patients History and Physical, chart, labs and discussed the procedure including the risks, benefits and alternatives for the proposed anesthesia with the patient or authorized representative who has indicated his/her understanding and acceptance.     Plan Discussed with:   Anesthesia Plan Comments:         Anesthesia Quick Evaluation  

## 2014-07-14 NOTE — Anesthesia Procedure Notes (Signed)
Procedure Name: MAC Date/Time: 07/14/2014 11:54 AM Performed by: Andree Elk, Dmiyah Liscano A Pre-anesthesia Checklist: Patient identified, Timeout performed, Emergency Drugs available, Suction available and Patient being monitored Oxygen Delivery Method: Nasal cannula

## 2014-07-14 NOTE — Transfer of Care (Signed)
Immediate Anesthesia Transfer of Care Note  Patient: Samuel Gregory  Procedure(s) Performed: Procedure(s) with comments: CATARACT EXTRACTION PHACO AND INTRAOCULAR LENS PLACEMENT (IOC) (Left) - CDE:6.20  Patient Location: Short Stay  Anesthesia Type:MAC  Level of Consciousness: awake, alert , oriented and patient cooperative  Airway & Oxygen Therapy: Patient Spontanous Breathing  Post-op Assessment: Report given to PACU RN and Post -op Vital signs reviewed and stable  Post vital signs: Reviewed and stable  Complications: No apparent anesthesia complications

## 2014-07-14 NOTE — Op Note (Signed)
Date of Admission: 07/14/2014  Date of Surgery: 07/14/2014   Pre-Op Dx: Cataract Left Eye  Post-Op Dx: Senile Combined Cataract Left  Eye,  Dx Code M78.675  Surgeon: Tonny Branch, M.D.  Assistants: None  Anesthesia: Topical with MAC  Indications: Painless, progressive loss of vision with compromise of daily activities.  Surgery: Cataract Extraction with Intraocular lens Implant Left Eye  Discription: The patient had dilating drops and viscous lidocaine placed into the Left eye in the pre-op holding area. After transfer to the operating room, a time out was performed. The patient was then prepped and draped. Beginning with a 66 degree blade a paracentesis port was made at the surgeon's 2 o'clock position. The anterior chamber was then filled with 1% non-preserved lidocaine. This was followed by filling the anterior chamber with Provisc.  A 2.10mm keratome blade was used to make a clear corneal incision at the temporal limbus.  A bent cystatome needle was used to create a continuous tear capsulotomy. Hydrodissection was performed with balanced salt solution on a Fine canula. The lens nucleus was then removed using the phacoemulsification handpiece. Residual cortex was removed with the I&A handpiece. The anterior chamber and capsular bag were refilled with Provisc. A posterior chamber intraocular lens was placed into the capsular bag with it's injector. The implant was positioned with the Kuglan hook. The Provisc was then removed from the anterior chamber and capsular bag with the I&A handpiece. Stromal hydration of the main incision and paracentesis port was performed with BSS on a Fine canula. The wounds were tested for leak which was negative. The patient tolerated the procedure well. There were no operative complications. The patient was then transferred to the recovery room in stable condition.  Complications: None  Specimen: None  EBL: None  Prosthetic device: Hoya iSert 250, power 19.0 D,  SN [id number].

## 2014-07-14 NOTE — H&P (Signed)
I have reviewed the H&P, the patient was re-examined, and I have identified no interval changes in medical condition and plan of care since the history and physical of record  

## 2014-07-14 NOTE — Discharge Instructions (Signed)

## 2014-07-16 ENCOUNTER — Encounter (HOSPITAL_COMMUNITY): Payer: Self-pay | Admitting: Ophthalmology

## 2014-07-21 DIAGNOSIS — H25811 Combined forms of age-related cataract, right eye: Secondary | ICD-10-CM | POA: Diagnosis not present

## 2014-07-23 ENCOUNTER — Other Ambulatory Visit: Payer: Self-pay | Admitting: *Deleted

## 2014-07-23 MED ORDER — ROSUVASTATIN CALCIUM 40 MG PO TABS: 20.0000 mg | ORAL_TABLET | Freq: Every day | ORAL | Status: DC

## 2014-07-23 NOTE — Telephone Encounter (Signed)
refill complete 

## 2014-07-23 NOTE — Telephone Encounter (Signed)
Pt needs crestor called in to [facility]

## 2014-07-30 DIAGNOSIS — H25811 Combined forms of age-related cataract, right eye: Secondary | ICD-10-CM | POA: Diagnosis not present

## 2014-08-04 NOTE — Patient Instructions (Signed)
Your procedure is scheduled on:  Monday, 08/11/14  Report to [facility] at     0930   AM.  Call this number if you have problems the morning of surgery: [phone]   Remember:   Do not eat or drink   After Midnight.  Take these medicines the morning of surgery with A SIP OF WATER: metoprolol and ramipril   Do not wear jewelry, make-up or nail polish.  Do not wear lotions, powders, or perfumes. You may wear deodorant.  Do not bring valuables to the hospital.  Contacts, dentures or bridgework may not be worn into surgery.     Patients discharged the day of surgery will not be allowed to drive home.  Name and phone number of your driver: driver  Special Instructions: Use eye drops as directed.   Please read over the following fact sheets that you were given: Pain Booklet, Anesthesia Post-op Instructions and Care and Recovery After Surgery    Cataract Surgery  A cataract is a clouding of the lens of the eye. When a lens becomes cloudy, vision is reduced based on the degree and nature of the clouding. Surgery may be needed to improve vision. Surgery removes the cloudy lens and usually replaces it with a substitute lens (intraocular lens, IOL). LET YOUR EYE DOCTOR KNOW ABOUT:  Allergies to food or medicine.   Medicines taken including herbs, eyedrops, over-the-counter medicines, and creams.   Use of steroids (by mouth or creams).   Previous problems with anesthetics or numbing medicine.   History of bleeding problems or blood clots.   Previous surgery.   Other health problems, including diabetes and kidney problems.   Possibility of pregnancy, if this applies.  RISKS AND COMPLICATIONS  Infection.   Inflammation of the eyeball (endophthalmitis) that can spread to both eyes (sympathetic ophthalmia).   Poor wound healing.   If an IOL is inserted, it can later fall out of proper position. This is very uncommon.   Clouding of the part of your eye that holds an IOL in place. This  is called an "after-cataract." These are uncommon, but easily treated.  BEFORE THE PROCEDURE  Do not eat or drink anything except small amounts of water for 8 to 12 before your surgery, or as directed by your caregiver.   Unless you are told otherwise, continue any eyedrops you have been prescribed.   Talk to your primary caregiver about all other medicines that you take (both prescription and non-prescription). In some cases, you may need to stop or change medicines near the time of your surgery. This is most important if you are taking blood-thinning medicine.Do not stop medicines unless you are told to do so.   Arrange for someone to drive you to and from the procedure.   Do not put contact lenses in either eye on the day of your surgery.  PROCEDURE There is more than one method for safely removing a cataract. Your doctor can explain the differences and help determine which is best for you. Phacoemulsification surgery is the most common form of cataract surgery.  An injection is given behind the eye or eyedrops are given to make this a painless procedure.   A small cut (incision) is made on the edge of the clear, dome-shaped surface that covers the front of the eye (cornea).   A tiny probe is painlessly inserted into the eye. This device gives off ultrasound waves that soften and break up the cloudy center of the lens. This  makes it easier for the cloudy lens to be removed by suction.   An IOL may be implanted.   The normal lens of the eye is covered by a clear capsule. Part of that capsule is intentionally left in the eye to support the IOL.   Your surgeon may or may not use stitches to close the incision.  There are other forms of cataract surgery that require a larger incision and stiches to close the eye. This approach is taken in cases where the doctor feels that the cataract cannot be easily removed using phacoemulsification. AFTER THE PROCEDURE  When an IOL is implanted, it  does not need care. It becomes a permanent part of your eye and cannot be seen or felt.   Your doctor will schedule follow-up exams to check on your progress.   Review your other medicines with your doctor to see which can be resumed after surgery.   Use eyedrops or take medicine as prescribed by your doctor.  Document Released: 07/07/2011 Document Reviewed: 07/04/2011 [facility] Patient Information 2012 [place].  PATIENT INSTRUCTIONS POST-ANESTHESIA  IMMEDIATELY FOLLOWING SURGERY:  Do not drive or operate machinery for the first twenty four hours after surgery.  Do not make any important decisions for twenty four hours after surgery or while taking narcotic pain medications or sedatives.  If you develop intractable nausea and vomiting or a severe headache please notify your doctor immediately.  FOLLOW-UP:  Please make an appointment with your surgeon as instructed. You do not need to follow up with anesthesia unless specifically instructed to do so.  WOUND CARE INSTRUCTIONS (if applicable):  Keep a dry clean dressing on the anesthesia/puncture wound site if there is drainage.  Once the wound has quit draining you may leave it open to air.  Generally you should leave the bandage intact for twenty four hours unless there is drainage.  If the epidural site drains for more than 36-48 hours please call the anesthesia department.  QUESTIONS?:  Please feel free to call your physician or the hospital operator if you have any questions, and they will be happy to assist you.           Cataract Surgery Care After Refer to this sheet in the next few weeks. These instructions provide you with information on caring for yourself after your procedure. Your caregiver may also give you more specific instructions. Your treatment has been planned according to current medical practices, but problems sometimes occur. Call your caregiver if you have any problems or questions after your procedure.  HOME  CARE INSTRUCTIONS   Avoid strenuous activities as directed by your caregiver.  Ask your caregiver when you can resume driving.  Use eyedrops or other medicines to help healing and control pressure inside your eye as directed by your caregiver.  Only take over-the-counter or prescription medicines for pain, discomfort, or fever as directed by your caregiver.  Do not to touch or rub your eyes.  You may be instructed to use a protective shield during the first few days and nights after surgery. If not, wear sunglasses to protect your eyes. This is to protect the eye from pressure or from being accidentally bumped.  Keep the area around your eye clean and dry. Avoid swimming or allowing water to hit you directly in the face while showering. Keep soap and shampoo out of your eyes.  Do not bend or lift heavy objects. Bending increases pressure in the eye. You can walk, climb stairs, and do  light household chores.  Do not put a contact lens into the eye that had surgery until your caregiver says it is okay to do so.  Ask your doctor when you can return to work. This will depend on the kind of work that you do. If you work in a dusty environment, you may be advised to wear protective eyewear for a period of time.  Ask your caregiver when it will be safe to engage in sexual activity.  Continue with your regular eye exams as directed by your caregiver. What to expect:  It is normal to feel itching and mild discomfort for a few days after cataract surgery. Some fluid discharge is also common, and your eye may be sensitive to light and touch.  After 1 to 2 days, even moderate discomfort should disappear. In most cases, healing will take about 6 weeks.  If you received an intraocular lens (IOL), you may notice that colors are very bright or have a blue tinge. Also, if you have been in bright sunlight, everything may appear reddish for a few hours. If you see these color tinges, it is because your  lens is clear and no longer cloudy. Within a few months after receiving an IOL, these extra colors should go away. When you have healed, you will probably need new glasses. SEEK MEDICAL CARE IF:   You have increased bruising around your eye.  You have discomfort not helped by medicine. SEEK IMMEDIATE MEDICAL CARE IF:   You have a fever.  You have a worsening or sudden vision loss.  You have redness, swelling, or increasing pain in the eye.  You have a thick discharge from the eye that had surgery. MAKE SURE YOU:  Understand these instructions.  Will watch your condition.  Will get help right away if you are not doing well or get worse. Document Released: 02/04/2005 Document Revised: 10/10/2011 Document Reviewed: 03/11/2011 [facility] Patient Information 2015 [place], [place]. This information is not intended to replace advice given to you by your health care provider. Make sure you discuss any questions you have with your health care provider. Cataract Surgery  A cataract is a clouding of the lens of the eye. When a lens becomes cloudy, vision is reduced based on the degree and nature of the clouding. Surgery may be needed to improve vision. Surgery removes the cloudy lens and usually replaces it with a substitute lens (intraocular lens, IOL). LET YOUR EYE DOCTOR KNOW ABOUT:  Allergies to food or medicine.  Medicines taken including herbs, eye drops, over-the-counter medicines, and creams.  Use of steroids (by mouth or creams).  Previous problems with anesthetics or numbing medicine.  History of bleeding problems or blood clots.  Previous surgery.  Other health problems, including diabetes and kidney problems.  Possibility of pregnancy, if this applies. RISKS AND COMPLICATIONS  Infection.  Inflammation of the eyeball (endophthalmitis) that can spread to both eyes (sympathetic ophthalmia).  Poor wound healing.  If an IOL is inserted, it can later fall out of proper  position. This is very uncommon.  Clouding of the part of your eye that holds an IOL in place. This is called an "after-cataract." These are uncommon but easily treated. BEFORE THE PROCEDURE  Do not eat or drink anything except small amounts of water for 8 to 12 before your surgery, or as directed by your caregiver.  Unless you are told otherwise, continue any eye drops you have been prescribed.  Talk to your primary caregiver about all  other medicines that you take (both prescription and nonprescription). In some cases, you may need to stop or change medicines near the time of your surgery. This is most important if you are taking blood-thinning medicine.Do not stop medicines unless you are told to do so.  Arrange for someone to drive you to and from the procedure.  Do not put contact lenses in either eye on the day of your surgery. PROCEDURE There is more than one method for safely removing a cataract. Your doctor can explain the differences and help determine which is best for you. Phacoemulsification surgery is the most common form of cataract surgery.  An injection is given behind the eye or eye drops are given to make this a painless procedure.  A small cut (incision) is made on the edge of the clear, dome-shaped surface that covers the front of the eye (cornea).  A tiny probe is painlessly inserted into the eye. This device gives off ultrasound waves that soften and break up the cloudy center of the lens. This makes it easier for the cloudy lens to be removed by suction.  An IOL may be implanted.  The normal lens of the eye is covered by a clear capsule. Part of that capsule is intentionally left in the eye to support the IOL.  Your surgeon may or may not use stitches to close the incision. There are other forms of cataract surgery that require a larger incision and stitches to close the eye. This approach is taken in cases where the doctor feels that the cataract cannot be  easily removed using phacoemulsification. AFTER THE PROCEDURE  When an IOL is implanted, it does not need care. It becomes a permanent part of your eye and cannot be seen or felt.  Your doctor will schedule follow-up exams to check on your progress.  Review your other medicines with your doctor to see which can be resumed after surgery.  Use eye drops or take medicine as prescribed by your doctor. Document Released: 07/07/2011 Document Revised: 12/02/2013 Document Reviewed: 07/07/2011 [facility] Patient Information 2015 [place], [place]. This information is not intended to replace advice given to you by your health care provider. Make sure you discuss any questions you have with your health care provider. PATIENT INSTRUCTIONS POST-ANESTHESIA  IMMEDIATELY FOLLOWING SURGERY:  Do not drive or operate machinery for the first twenty four hours after surgery.  Do not make any important decisions for twenty four hours after surgery or while taking narcotic pain medications or sedatives.  If you develop intractable nausea and vomiting or a severe headache please notify your doctor immediately.  FOLLOW-UP:  Please make an appointment with your surgeon as instructed. You do not need to follow up with anesthesia unless specifically instructed to do so.  WOUND CARE INSTRUCTIONS (if applicable):  Keep a dry clean dressing on the anesthesia/puncture wound site if there is drainage.  Once the wound has quit draining you may leave it open to air.  Generally you should leave the bandage intact for twenty four hours unless there is drainage.  If the epidural site drains for more than 36-48 hours please call the anesthesia department.  QUESTIONS?:  Please feel free to call your physician or the hospital operator if you have any questions, and they will be happy to assist you.

## 2014-08-05 ENCOUNTER — Encounter (HOSPITAL_COMMUNITY)
Admission: RE | Admit: 2014-08-05 | Discharge: 2014-08-05 | Disposition: A | Discharge status: Home / Self Care | Payer: Medicare Other | Source: Ambulatory Visit | Attending: Ophthalmology | Admitting: Ophthalmology

## 2014-08-05 ENCOUNTER — Encounter (HOSPITAL_COMMUNITY): Payer: Self-pay

## 2014-08-05 NOTE — Pre-Procedure Instructions (Signed)
Attempted to contact patient for telephone interview.  No answer at home number.

## 2014-08-08 MED ORDER — PHENYLEPHRINE HCL 2.5 % OP SOLN
OPHTHALMIC | Status: AC
  Filled 2014-08-08: qty 15

## 2014-08-08 MED ORDER — TETRACAINE HCL 0.5 % OP SOLN
OPHTHALMIC | Status: AC
  Filled 2014-08-08: qty 2

## 2014-08-08 MED ORDER — CYCLOPENTOLATE-PHENYLEPHRINE OP SOLN OPTIME - NO CHARGE
OPHTHALMIC | Status: AC
  Filled 2014-08-08: qty 2

## 2014-08-08 MED ORDER — NEOMYCIN-POLYMYXIN-DEXAMETH 3.5-10000-0.1 OP SUSP
OPHTHALMIC | Status: AC
  Filled 2014-08-08: qty 5

## 2014-08-11 ENCOUNTER — Ambulatory Visit (HOSPITAL_COMMUNITY)
Admission: RE | Admit: 2014-08-11 | Discharge: 2014-08-11 | Disposition: A | Discharge status: Home / Self Care | Payer: Medicare Other | Source: Ambulatory Visit | Attending: Ophthalmology | Admitting: Ophthalmology

## 2014-08-11 ENCOUNTER — Ambulatory Visit (HOSPITAL_COMMUNITY): Payer: Medicare Other | Admitting: Anesthesiology

## 2014-08-11 ENCOUNTER — Encounter (HOSPITAL_COMMUNITY)
Admission: RE | Disposition: A | Discharge status: Home / Self Care | Payer: Self-pay | Source: Ambulatory Visit | Attending: Ophthalmology

## 2014-08-11 ENCOUNTER — Encounter (HOSPITAL_COMMUNITY): Payer: Self-pay | Admitting: *Deleted

## 2014-08-11 DIAGNOSIS — H25811 Combined forms of age-related cataract, right eye: Secondary | ICD-10-CM | POA: Insufficient documentation

## 2014-08-11 DIAGNOSIS — H259 Unspecified age-related cataract: Secondary | ICD-10-CM | POA: Diagnosis not present

## 2014-08-11 HISTORY — PX: CATARACT EXTRACTION W/PHACO: SHX586

## 2014-08-11 SURGERY — PHACOEMULSIFICATION, CATARACT, WITH IOL INSERTION
Anesthesia: Monitor Anesthesia Care | Site: Eye | Laterality: Right

## 2014-08-11 MED ORDER — NEOMYCIN-POLYMYXIN-DEXAMETH 3.5-10000-0.1 OP SUSP
OPHTHALMIC | Status: DC | PRN
  Administered 2014-08-11: 2 [drp] via OPHTHALMIC

## 2014-08-11 MED ORDER — EPINEPHRINE HCL 1 MG/ML IJ SOLN
INTRAMUSCULAR | Status: DC | PRN
  Administered 2014-08-11: 11:00:00

## 2014-08-11 MED ORDER — FENTANYL CITRATE 0.05 MG/ML IJ SOLN: 25.0000 ug | INTRAMUSCULAR | Status: DC | PRN

## 2014-08-11 MED ORDER — PROVISC 10 MG/ML IO SOLN
INTRAOCULAR | Status: DC | PRN
  Administered 2014-08-11: 0.85 mL via INTRAOCULAR

## 2014-08-11 MED ORDER — TETRACAINE HCL 0.5 % OP SOLN
1.0000 [drp] | OPHTHALMIC | Status: AC | PRN
  Administered 2014-08-11 (×3): 1 [drp] via OPHTHALMIC

## 2014-08-11 MED ORDER — LACTATED RINGERS IV SOLN
INTRAVENOUS | Status: DC
  Administered 2014-08-11: 1000 mL via INTRAVENOUS

## 2014-08-11 MED ORDER — EPINEPHRINE HCL 1 MG/ML IJ SOLN
INTRAMUSCULAR | Status: AC
  Filled 2014-08-11: qty 1

## 2014-08-11 MED ORDER — PHENYLEPHRINE HCL 2.5 % OP SOLN
OPHTHALMIC | Status: AC
  Filled 2014-08-11: qty 15

## 2014-08-11 MED ORDER — POVIDONE-IODINE 5 % OP SOLN
OPHTHALMIC | Status: DC | PRN
  Administered 2014-08-11: 1 via OPHTHALMIC

## 2014-08-11 MED ORDER — MIDAZOLAM HCL 2 MG/2ML IJ SOLN
1.0000 mg | INTRAMUSCULAR | Status: DC | PRN
  Administered 2014-08-11: 2 mg via INTRAVENOUS

## 2014-08-11 MED ORDER — CYCLOPENTOLATE-PHENYLEPHRINE 0.2-1 % OP SOLN
1.0000 [drp] | OPHTHALMIC | Status: AC | PRN
  Administered 2014-08-11 (×3): 1 [drp] via OPHTHALMIC

## 2014-08-11 MED ORDER — BSS IO SOLN
INTRAOCULAR | Status: DC | PRN
  Administered 2014-08-11: 15 mL via INTRAOCULAR

## 2014-08-11 MED ORDER — ONDANSETRON HCL 4 MG/2ML IJ SOLN: 4.0000 mg | Freq: Once | INTRAMUSCULAR | Status: DC | PRN

## 2014-08-11 MED ORDER — LIDOCAINE HCL 3.5 % OP GEL
1.0000 "application " | Freq: Once | OPHTHALMIC | Status: AC
  Administered 2014-08-11: 1 via OPHTHALMIC

## 2014-08-11 MED ORDER — MIDAZOLAM HCL 2 MG/2ML IJ SOLN
INTRAMUSCULAR | Status: AC
  Filled 2014-08-11: qty 2

## 2014-08-11 MED ORDER — FENTANYL CITRATE 0.05 MG/ML IJ SOLN
INTRAMUSCULAR | Status: AC
  Filled 2014-08-11: qty 2

## 2014-08-11 MED ORDER — LIDOCAINE HCL (PF) 1 % IJ SOLN
INTRAMUSCULAR | Status: DC | PRN
  Administered 2014-08-11: .5 mL

## 2014-08-11 MED ORDER — PHENYLEPHRINE HCL 2.5 % OP SOLN
1.0000 [drp] | OPHTHALMIC | Status: AC | PRN
  Administered 2014-08-11 (×3): 1 [drp] via OPHTHALMIC

## 2014-08-11 MED ORDER — FENTANYL CITRATE 0.05 MG/ML IJ SOLN
25.0000 ug | INTRAMUSCULAR | Status: AC
  Administered 2014-08-11 (×2): 25 ug via INTRAVENOUS

## 2014-08-11 SURGICAL SUPPLY — 34 items
CAPSULAR TENSION RING-AMO (OPHTHALMIC RELATED) IMPLANT
CLOTH BEACON ORANGE TIMEOUT ST (SAFETY) ×2 IMPLANT
EYE SHIELD UNIVERSAL CLEAR (GAUZE/BANDAGES/DRESSINGS) ×2 IMPLANT
GLOVE BIO SURGEON STRL SZ 6.5 (GLOVE) IMPLANT
GLOVE BIO SURGEONS STRL SZ 6.5 (GLOVE)
GLOVE BIOGEL PI IND STRL 6.5 (GLOVE) IMPLANT
GLOVE BIOGEL PI IND STRL 7.0 (GLOVE) IMPLANT
GLOVE BIOGEL PI IND STRL 7.5 (GLOVE) IMPLANT
GLOVE BIOGEL PI INDICATOR 6.5 (GLOVE) ×4
GLOVE BIOGEL PI INDICATOR 7.0 (GLOVE)
GLOVE BIOGEL PI INDICATOR 7.5 (GLOVE)
GLOVE ECLIPSE 6.5 STRL STRAW (GLOVE) IMPLANT
GLOVE ECLIPSE 7.0 STRL STRAW (GLOVE) IMPLANT
GLOVE ECLIPSE 7.5 STRL STRAW (GLOVE) IMPLANT
GLOVE EXAM NITRILE LRG STRL (GLOVE) IMPLANT
GLOVE EXAM NITRILE MD LF STRL (GLOVE) IMPLANT
GLOVE SKINSENSE NS SZ6.5 (GLOVE)
GLOVE SKINSENSE NS SZ7.0 (GLOVE)
GLOVE SKINSENSE STRL SZ6.5 (GLOVE) IMPLANT
GLOVE SKINSENSE STRL SZ7.0 (GLOVE) IMPLANT
KIT VITRECTOMY (OPHTHALMIC RELATED) IMPLANT
LENS INTRAOCULAR RESTOR ×2 IMPLANT
PAD ARMBOARD 7.5X6 YLW CONV (MISCELLANEOUS) ×2 IMPLANT
PROC W NO LENS (INTRAOCULAR LENS)
PROC W SPEC LENS (INTRAOCULAR LENS) ×3
PROCESS W NO LENS (INTRAOCULAR LENS) IMPLANT
PROCESS W SPEC LENS (INTRAOCULAR LENS) IMPLANT
RETRACTOR IRIS SIGHTPATH (OPHTHALMIC RELATED) IMPLANT
RING MALYGIN (MISCELLANEOUS) IMPLANT
SYRINGE LUER LOK 1CC (MISCELLANEOUS) ×2 IMPLANT
TAPE SURG TRANSPARENT 2IN (GAUZE/BANDAGES/DRESSINGS) IMPLANT
TAPE TRANSPARENT 2IN (GAUZE/BANDAGES/DRESSINGS) ×2
VISCOELASTIC ADDITIONAL (OPHTHALMIC RELATED) IMPLANT
WATER STERILE IRR 250ML POUR (IV SOLUTION) ×2 IMPLANT

## 2014-08-11 NOTE — Anesthesia Preprocedure Evaluation (Signed)
Anesthesia Evaluation  Patient identified by MRN, date of birth, ID band Patient awake    Reviewed: Allergy & Precautions, H&P , NPO status , Patient's Chart, lab work & pertinent test results  Airway Mallampati: II  TM Distance: >3 FB     Dental   Pulmonary neg pulmonary ROS,  breath sounds clear to auscultation        Cardiovascular hypertension, Pt. on medications - angina+ CAD and + CABG Rhythm:Regular Rate:Normal     Neuro/Psych    GI/Hepatic negative GI ROS,   Endo/Other    Renal/GU      Musculoskeletal  (+) Arthritis -,   Abdominal   Peds  Hematology   Anesthesia Other Findings   Reproductive/Obstetrics                             Anesthesia Physical Anesthesia Plan  ASA: III  Anesthesia Plan: MAC   Post-op Pain Management:    Induction: Intravenous  Airway Management Planned: Nasal Cannula  Additional Equipment:   Intra-op Plan:   Post-operative Plan:   Informed Consent: I have reviewed the patients History and Physical, chart, labs and discussed the procedure including the risks, benefits and alternatives for the proposed anesthesia with the patient or authorized representative who has indicated his/her understanding and acceptance.     Plan Discussed with:   Anesthesia Plan Comments:         Anesthesia Quick Evaluation

## 2014-08-11 NOTE — Discharge Instructions (Signed)

## 2014-08-11 NOTE — H&P (Signed)
I have reviewed the H&P, the patient was re-examined, and I have identified no interval changes in medical condition and plan of care since the history and physical of record  

## 2014-08-11 NOTE — Op Note (Signed)
Date of Admission: 08/11/2014  Date of Surgery: 08/11/2014  Pre-Op Dx: Cataract Right  Eye  Post-Op Dx: Senile Combined Cataract  Right  Eye,  Dx Code Y19.509  Surgeon: Tonny Branch, M.D.  Assistants: None  Anesthesia: Topical with MAC  Indications: Painless, progressive loss of vision with compromise of daily activities.  Surgery: Cataract Extraction with Intraocular lens Implant Right Eye  Discription: The patient had dilating drops and viscous lidocaine placed into the Right eye in the pre-op holding area. After transfer to the operating room, a time out was performed. The patient was then prepped and draped. Beginning with a 29 degree blade a paracentesis port was made at the surgeon's 2 o'clock position. The anterior chamber was then filled with 1% non-preserved lidocaine. This was followed by filling the anterior chamber with Provisc.  A 2.41mm keratome blade was used to make a clear corneal incision at the temporal limbus.  A bent cystatome needle was used to create a continuous tear capsulotomy. Hydrodissection was performed with balanced salt solution on a Fine canula. The lens nucleus was then removed using the phacoemulsification handpiece. Residual cortex was removed with the I&A handpiece. The anterior chamber and capsular bag were refilled with Provisc. A posterior chamber intraocular lens was placed into the capsular bag with it's injector. The implant was positioned with the Kuglan hook. The Provisc was then removed from the anterior chamber and capsular bag with the I&A handpiece. Stromal hydration of the main incision and paracentesis port was performed with BSS on a Fine canula. The wounds were tested for leak which was negative. The patient tolerated the procedure well. There were no operative complications. The patient was then transferred to the recovery room in stable condition.  Complications: None  Specimen: None  EBL: None  Prosthetic device: Alcon AcrySof Restor  [id number], power 21.5 D, SN [id number].

## 2014-08-11 NOTE — Anesthesia Postprocedure Evaluation (Signed)
  Anesthesia Post-op Note  Patient: Samuel Gregory  Procedure(s) Performed: Procedure(s) with comments: CATARACT EXTRACTION PHACO AND INTRAOCULAR LENS PLACEMENT RIGHT EYE (Right) - CDE:10.11  Patient Location: Short Stay  Anesthesia Type:MAC  Level of Consciousness: awake, alert , oriented and patient cooperative  Airway and Oxygen Therapy: Patient Spontanous Breathing  Post-op Pain: none  Post-op Assessment: Post-op Vital signs reviewed, Patient's Cardiovascular Status Stable, Respiratory Function Stable, Patent Airway, No signs of Nausea or vomiting and Pain level controlled  Post-op Vital Signs: Reviewed and stable  Last Vitals:  Filed Vitals:   08/11/14 1100  BP: 96/69  Temp:   Resp: 51    Complications: No apparent anesthesia complications

## 2014-08-11 NOTE — Transfer of Care (Signed)
Immediate Anesthesia Transfer of Care Note  Patient: Samuel Gregory  Procedure(s) Performed: Procedure(s) with comments: CATARACT EXTRACTION PHACO AND INTRAOCULAR LENS PLACEMENT RIGHT EYE (Right) - CDE:10.11  Patient Location: Short Stay  Anesthesia Type:MAC  Level of Consciousness: awake, alert , oriented and patient cooperative  Airway & Oxygen Therapy: Patient Spontanous Breathing  Post-op Assessment: Report given to PACU RN and Post -op Vital signs reviewed and stable  Post vital signs: Reviewed and stable  Complications: No apparent anesthesia complications

## 2014-08-13 ENCOUNTER — Encounter (HOSPITAL_COMMUNITY): Payer: Self-pay | Admitting: Ophthalmology

## 2014-09-08 ENCOUNTER — Ambulatory Visit (INDEPENDENT_AMBULATORY_CARE_PROVIDER_SITE_OTHER): Payer: Medicare Other | Admitting: Adult Health

## 2014-09-08 ENCOUNTER — Encounter: Payer: Self-pay | Admitting: Adult Health

## 2014-09-08 ENCOUNTER — Telehealth: Payer: Self-pay

## 2014-09-08 VITALS — BP 132/94 | HR 104 | Ht 66.0 in | Wt 156.0 lb

## 2014-09-08 DIAGNOSIS — I2510 Atherosclerotic heart disease of native coronary artery without angina pectoris: Secondary | ICD-10-CM | POA: Diagnosis not present

## 2014-09-08 DIAGNOSIS — I10 Essential (primary) hypertension: Secondary | ICD-10-CM

## 2014-09-08 DIAGNOSIS — I471 Supraventricular tachycardia: Secondary | ICD-10-CM

## 2014-09-08 MED ORDER — METOPROLOL TARTRATE 25 MG PO TABS: 25.0000 mg | ORAL_TABLET | Freq: Two times a day (BID) | ORAL | Status: DC

## 2014-09-08 NOTE — Progress Notes (Signed)
Cardiology Office Note   Date:  09/08/2014   ID:  Samuel Gregory, DOB [birth year], MRN [id number]  PCP:  Delphina Cahill, MD  Cardiologist:  [place]/ Jory Sims, NP   Chief Complaint  Patient presents with  . Tachycardia  . Coronary Artery Disease    Status post CABG, 2000      History of Present Illness: Samuel Gregory is a 74 y.o. male who presents for complaints the tachycardia.  Follow him for coronary artery disease, status post coronary bypass grafting in 2000, hypertension, hyperlipidemia.  He was last seen by Dr. Domenic Polite in September of 2015 and was stable from a cardiovascular standpoint.  Crestor was cut back to 20 mg daily in the setting of some mild myalgias.  There was a consideration to have a followup stress test.  Should he become symptomatic.   He states that since he had cataract surgery and began using Flonase nasal spray his HR and BP have gone up.  He is not having chest pain or dyspnea associated. He cannot tell his HR is up but noticed it when he took his BP. He is otherwise asymptomatic.  Past Medical History  Diagnosis Date  . Coronary atherosclerosis of native coronary artery     Multivessel status post CABG 2000  . Essential hypertension, benign   . Hyperlipidemia   . Cancer   . Arthritis     Past Surgical History  Procedure Laterality Date  . Coronary artery bypass graft  01/1999    LIMA to LAD, SVG to diagonal, sequential SVG to PDA and PLB  . [place]ctomy  2004    [name]  . Appendectomy      8 yrs ago- Dr Geroge Baseman  . Tonsillectomy      age 29  . Cataract extraction w/phaco Left 07/14/2014    Procedure: CATARACT EXTRACTION PHACO AND INTRAOCULAR LENS PLACEMENT (IOC);  Surgeon: Tonny Branch, MD;  Location: [facility];  Service: Ophthalmology;  Laterality: Left;  CDE:6.20  . Cataract extraction w/phaco Right 08/11/2014    Procedure: CATARACT EXTRACTION PHACO AND INTRAOCULAR LENS PLACEMENT RIGHT EYE;  Surgeon: Tonny Branch, MD;  Location: [facility];   Service: Ophthalmology;  Laterality: Right;  CDE:10.11     Current Outpatient Prescriptions  Medication Sig Dispense Refill  . aspirin 325 MG tablet Take 325 mg by mouth daily.    . fish oil-omega-3 fatty acids 1000 MG capsule Take 1 g by mouth daily. Alternating from fish oil to flaxseed    . Flaxseed, Linseed, 1000 MG CAPS Take 1,000 mg by mouth daily.    Marland Kitchen loteprednol (LOTEMAX) 0.5 % ophthalmic suspension Apply 1 drop to eye 2 (two) times daily.    . metoprolol succinate (TOPROL-XL) 25 MG 24 hr tablet Take 12.5 mg by mouth daily.    . Multiple Vitamin (MULTIVITAMIN WITH MINERALS) TABS Take 1 tablet by mouth daily.    . naproxen sodium (ANAPROX) 220 MG tablet Take 220 mg by mouth daily.    Marland Kitchen PROLENSA 0.07 % SOLN Apply 1 drop to eye daily.  0  . ramipril (ALTACE) 5 MG capsule TAKE (1) CAPSULE BY MOUTH ONCE DAILY. 30 capsule 11  . rosuvastatin (CRESTOR) 40 MG tablet Take 0.5 tablets (20 mg total) by mouth daily. 30 tablet 11  . Timolol Maleate (ISTALOL) 0.5 % (DAILY) SOLN Apply 1 drop to eye daily.    . metoprolol tartrate (LOPRESSOR) 25 MG tablet Take 1 tablet (25 mg total) by mouth 2 (two) times daily.  180 tablet 3   No current facility-administered medications for this visit.    Allergies:   Sulfa antibiotics    Social History:  The patient  reports that he has never smoked. He has never used smokeless tobacco. He reports that he does not drink alcohol or use illicit drugs.   Family History:  The patient's family history includes Aneurysm in his father; Cancer in his mother; Heart attack in his mother.    ROS:  Please see the history of present illness.   Otherwise, review of systems .   All other systems are reviewed and negative.    PHYSICAL EXAM: VS:  BP 132/94 mmHg  Pulse 104  Ht 5\' 6"  (1.676 m)  Wt 156 lb (70.761 kg)  BMI 25.19 kg/m2  SpO2 99% , BMI Body mass index is 25.19 kg/(m^2). GEN: Well nourished, well developed, in no acute distress HEENT: normal Neck: no  JVD, carotid bruits, or masses Cardiac: RRRR; no murmurs, rubs, or gallops,no edema  Respiratory:  clear to auscultation bilaterally, normal work of breathing GI: soft, nontender, nondistended, + BS MS: no deformity or atrophy Skin: warm and dry, no rash Neuro:  Strength and sensation are intact Psych: euthymic mood, full affect   EKG:  EKG ordered today. The ekg ordered today demonstrates Sinus tachycardia rate of 104.    Recent Labs: 07/08/2014: BUN 26*; Creatinine 1.49*; Hemoglobin 14.2; Potassium 4.5; Sodium 140    Lipid Panel    Component Value Date/Time   CHOL 155 03/01/2013 0735   TRIG 67 03/01/2013 0735   HDL 49 03/01/2013 0735   CHOLHDL 3.2 03/01/2013 0735   VLDL 13 03/01/2013 0735   LDLCALC 93 03/01/2013 0735      Wt Readings from Last 3 Encounters:  09/08/14 156 lb (70.761 kg)  04/14/14 156 lb (70.761 kg)  01/07/13 154 lb (69.854 kg)      Other studies Reviewed: Additional studies/ records that were reviewed today include: BMET, CBC, and TSH   ASSESSMENT AND PLAN:    Current medicines are reviewed at length with the patient today.  The patient has no concerns regarding medicines.  The following changes have been made:  Increase metoprolol to 25 mg daily Labs/ tests ordered today include: CBC.BMET, TSH  Orders Placed This Encounter  Procedures  . CBC w/Diff  . Basic Metabolic Panel (BMET)  . TSH  . EKG 12-Lead     Disposition:   FU with me in one month  Signed, Jory Sims, NP  09/08/2014 3:05 PM    [facility] [place], [place], [place]  [address] Phone: [phone]; Fax: [phone]

## 2014-09-08 NOTE — Telephone Encounter (Signed)
Pt has noted for several days elevated HR over 100 and increased BP.Typical resting HR for him is 58.Denies CP or SOB but is concerned enough to call and feels "something needs to be done".Patient of Dr Domenic Polite that will be seen by K.Lawrence NP today at 2:10 pm

## 2014-09-08 NOTE — Progress Notes (Deleted)
Name: Samuel Gregory    DOB: Dec 22, 1940  Age: 74 y.o.  MR#: [id number]       PCP:  Delphina Cahill, MD      Insurance: Payor: MEDICARE / Plan: MEDICARE PART A AND B / Product Type: *No Product type* /   CC:    Chief Complaint  Patient presents with  . Tachycardia  . Coronary Artery Disease    Status post CABG, 2000    VS Filed Vitals:   09/08/14 1412  BP: 132/94  Pulse: 104  Height: 5' 6"  (1.676 m)  Weight: 156 lb (70.761 kg)  SpO2: 99%    Weights Current Weight  09/08/14 156 lb (70.761 kg)  04/14/14 156 lb (70.761 kg)  01/07/13 154 lb (69.854 kg)    Blood Pressure  BP Readings from Last 3 Encounters:  09/08/14 132/94  08/11/14 116/86  07/14/14 122/74     Admit date:  (Not on file) Last encounter with RMR:  Visit date not found   Allergy Sulfa antibiotics  Current Outpatient Prescriptions  Medication Sig Dispense Refill  . aspirin 325 MG tablet Take 325 mg by mouth daily.    . fish oil-omega-3 fatty acids 1000 MG capsule Take 1 g by mouth daily. Alternating from fish oil to flaxseed    . Flaxseed, Linseed, 1000 MG CAPS Take 1,000 mg by mouth daily.    Marland Kitchen loteprednol (LOTEMAX) 0.5 % ophthalmic suspension Apply 1 drop to eye 2 (two) times daily.    . metoprolol succinate (TOPROL-XL) 25 MG 24 hr tablet Take 12.5 mg by mouth daily.    . Multiple Vitamin (MULTIVITAMIN WITH MINERALS) TABS Take 1 tablet by mouth daily.    . naproxen sodium (ANAPROX) 220 MG tablet Take 220 mg by mouth daily.    Marland Kitchen PROLENSA 0.07 % SOLN Apply 1 drop to eye daily.  0  . ramipril (ALTACE) 5 MG capsule TAKE (1) CAPSULE BY MOUTH ONCE DAILY. 30 capsule 11  . rosuvastatin (CRESTOR) 40 MG tablet Take 0.5 tablets (20 mg total) by mouth daily. 30 tablet 11  . Timolol Maleate (ISTALOL) 0.5 % (DAILY) SOLN Apply 1 drop to eye daily.     No current facility-administered medications for this visit.    Discontinued Meds:   There are no discontinued medications.  Patient Active Problem List   Diagnosis Date  Noted  . Essential hypertension 01/23/2013  . Coronary atherosclerosis of native coronary artery 01/23/2013  . Mixed hyperlipidemia 01/23/2013    LABS    Component Value Date/Time   NA 140 07/08/2014 1530   NA 136 10/30/2007 0400   NA 137 10/29/2007 2036   K 4.5 07/08/2014 1530   K 3.9 10/30/2007 0400   K 4.1 10/29/2007 2036   CL 103 07/08/2014 1530   CL 102 10/30/2007 0400   CL 102 10/29/2007 2036   CO2 29 07/08/2014 1530   CO2 28 10/30/2007 0400   CO2 29 10/29/2007 2036   GLUCOSE 101* 07/08/2014 1530   GLUCOSE 166* 10/30/2007 0400   GLUCOSE 136* 10/29/2007 2036   BUN 26* 07/08/2014 1530   BUN 12 10/30/2007 0400   BUN 15 10/29/2007 2036   CREATININE 1.49* 07/08/2014 1530   CREATININE 1.20 10/30/2007 0400   CREATININE 1.15 10/29/2007 2036   CALCIUM 9.3 07/08/2014 1530   CALCIUM 8.5 10/30/2007 0400   CALCIUM 9.3 10/29/2007 2036   GFRNONAA 45* 07/08/2014 1530   GFRNONAA >60 10/30/2007 0400   GFRNONAA >60 10/29/2007 2036   GFRAA 52*  07/08/2014 1530   GFRAA  10/30/2007 0400    >60        The eGFR has been calculated using the MDRD equation. This calculation has not been validated in all clinical   GFRAA  10/29/2007 2036    >60        The eGFR has been calculated using the MDRD equation. This calculation has not been validated in all clinical   CMP     Component Value Date/Time   NA 140 07/08/2014 1530   K 4.5 07/08/2014 1530   CL 103 07/08/2014 1530   CO2 29 07/08/2014 1530   GLUCOSE 101* 07/08/2014 1530   BUN 26* 07/08/2014 1530   CREATININE 1.49* 07/08/2014 1530   CALCIUM 9.3 07/08/2014 1530   PROT 6.4 10/29/2007 2036   ALBUMIN 4.0 10/29/2007 2036   AST 21 10/29/2007 2036   ALT 23 10/29/2007 2036   ALKPHOS 49 10/29/2007 2036   BILITOT 0.8 10/29/2007 2036   GFRNONAA 45* 07/08/2014 1530   GFRAA 52* 07/08/2014 1530       Component Value Date/Time   WBC 7.3 10/30/2007 0400   WBC 8.7 10/29/2007 2036   HGB 14.2 07/08/2014 1530   HGB 13.0 10/30/2007  0400   HGB 14.2 10/29/2007 2036   HCT 41.7 07/08/2014 1530   HCT 36.7* 10/30/2007 0400   HCT 40.1 10/29/2007 2036   MCV 89.3 10/30/2007 0400   MCV 88.9 10/29/2007 2036    Lipid Panel     Component Value Date/Time   CHOL 155 03/01/2013 0735   TRIG 67 03/01/2013 0735   HDL 49 03/01/2013 0735   CHOLHDL 3.2 03/01/2013 0735   VLDL 13 03/01/2013 0735   LDLCALC 93 03/01/2013 0735    ABG No results found for: PHART, PCO2ART, PO2ART, HCO3, TCO2, ACIDBASEDEF, O2SAT   Lab Results  Component Value Date   TSH 3.841 03/01/2013   BNP (last 3 results) No results for input(s): BNP in the last 8760 hours.  ProBNP (last 3 results) No results for input(s): PROBNP in the last 8760 hours.  Cardiac Panel (last 3 results) No results for input(s): CKTOTAL, CKMB, TROPONINI, RELINDX in the last 72 hours.  Iron/TIBC/Ferritin/ %Sat No results found for: IRON, TIBC, FERRITIN, IRONPCTSAT   EKG Orders placed or performed in visit on 09/08/14  . EKG 12-Lead     Prior Assessment and Plan Problem List as of 09/08/2014      Cardiovascular and Mediastinum   Essential hypertension   Last Assessment & Plan 04/14/2014 Office Visit Written 04/14/2014  4:17 PM by Satira Sark, MD    No change in antihypertensives.      Coronary atherosclerosis of native coronary artery   Last Assessment & Plan 04/14/2014 Office Visit Written 04/14/2014  4:16 PM by Satira Sark, MD    Systematically stable with history of multivessel disease status post CABG in 2000. ECG today is normal. Ischemic workup within the last 3 years was low risk. Plan is to continue medical therapy and observation. Annual followup, sooner if needed. We can discuss followup ischemic testing within the next one to 2 years assuming he remains clinically stable.        Other   Mixed hyperlipidemia   Last Assessment & Plan 04/14/2014 Office Visit Written 04/14/2014  4:17 PM by Satira Sark, MD    Try and cut Crestor back to 20 mg daily  to see if he notices any improvement in leg discomfort. Followup lipid panel with Dr.  Hall in 3 months.          Imaging: No results found.

## 2014-09-08 NOTE — Assessment & Plan Note (Signed)
He is tachycardic today with elevated HR. He is now on nasal steroids that are new for him. He has just had cataract surgery and is finishing up his eye gtts.  I will increase the metoprolol to 25 mg daily I have asked him to take his BP and HR daily and record. If his HR is <60 or BP is <110/0 he is to call us and go back to 12.,5 mg daily. Will see him in a month. I will check labs to include BMET, CBC and TSH.

## 2014-09-08 NOTE — Assessment & Plan Note (Signed)
BP is controlled currently. No other changes at this time.

## 2014-09-08 NOTE — Patient Instructions (Signed)
Your physician recommends that you schedule a follow-up appointment in: 1 month with Jory Sims, NP  Your physician has requested that you regularly monitor and record your blood pressure readings at home for 1 week. Please use the same machine at the same time of day to check your readings and record them to bring to office in one week.  Your physician recommends that you return for lab work in: (TSH, BMET, CBC )  Increase Metoprolol to 25 mg Daily  Thank you for choosing [facility]!

## 2014-09-09 LAB — CBC WITH DIFFERENTIAL/PLATELET
Basophils Absolute: 0 10*3/uL (ref 0.0–0.1)
Basophils Relative: 1 % (ref 0–1)
Eosinophils Absolute: 0.1 10*3/uL (ref 0.0–0.7)
Eosinophils Relative: 2 % (ref 0–5)
HCT: 42.2 % (ref 39.0–52.0)
Hemoglobin: 14.3 g/dL (ref 13.0–17.0)
Lymphocytes Relative: 36 % (ref 12–46)
Lymphs Abs: 1.5 10*3/uL (ref 0.7–4.0)
MCH: 30.2 pg (ref 26.0–34.0)
MCHC: 33.9 g/dL (ref 30.0–36.0)
MCV: 89.2 fL (ref 78.0–100.0)
MPV: 11.8 fL (ref 8.6–12.4)
Monocytes Absolute: 0.4 10*3/uL (ref 0.1–1.0)
Monocytes Relative: 9 % (ref 3–12)
Neutro Abs: 2.2 10*3/uL (ref 1.7–7.7)
Neutrophils Relative %: 52 % (ref 43–77)
Platelets: 175 10*3/uL (ref 150–400)
RBC: 4.73 MIL/uL (ref 4.22–5.81)
RDW: 13.4 % (ref 11.5–15.5)
WBC: 4.3 10*3/uL (ref 4.0–10.5)

## 2014-09-09 LAB — BASIC METABOLIC PANEL
BUN: 25 mg/dL — ABNORMAL HIGH (ref 6–23)
CALCIUM: 9.1 mg/dL (ref 8.4–10.5)
CHLORIDE: 105 meq/L (ref 96–112)
CO2: 27 mEq/L (ref 19–32)
Creat: 1.09 mg/dL (ref 0.50–1.35)
Glucose, Bld: 94 mg/dL (ref 70–99)
POTASSIUM: 4.2 meq/L (ref 3.5–5.3)
Sodium: 140 mEq/L (ref 135–145)

## 2014-09-09 LAB — TSH: TSH: 2.755 u[IU]/mL (ref 0.350–4.500)

## 2014-09-11 ENCOUNTER — Other Ambulatory Visit: Payer: Self-pay | Admitting: Cardiovascular Disease

## 2014-09-15 ENCOUNTER — Telehealth: Payer: Self-pay | Admitting: *Deleted

## 2014-09-15 NOTE — Telephone Encounter (Signed)
BP log received and placed and Jory Sims, NP folder.

## 2014-09-16 ENCOUNTER — Encounter: Payer: Self-pay | Admitting: Adult Health

## 2014-10-03 ENCOUNTER — Ambulatory Visit (INDEPENDENT_AMBULATORY_CARE_PROVIDER_SITE_OTHER): Payer: Medicare Other | Admitting: Adult Health

## 2014-10-03 ENCOUNTER — Encounter: Payer: Self-pay | Admitting: Adult Health

## 2014-10-03 VITALS — BP 122/88 | HR 106 | Ht 66.0 in | Wt 157.8 lb

## 2014-10-03 DIAGNOSIS — R000 Tachycardia, unspecified: Secondary | ICD-10-CM | POA: Diagnosis not present

## 2014-10-03 DIAGNOSIS — I10 Essential (primary) hypertension: Secondary | ICD-10-CM | POA: Diagnosis not present

## 2014-10-03 DIAGNOSIS — I2510 Atherosclerotic heart disease of native coronary artery without angina pectoris: Secondary | ICD-10-CM | POA: Diagnosis not present

## 2014-10-03 NOTE — Progress Notes (Signed)
Cardiology Office Note   Date:  10/03/2014   ID:  Samuel Gregory, DOB [birth year], MRN [id number]  PCP:  Delphina Cahill, MD  Cardiologist: [place]/  Jory Sims, NP   Chief Complaint  Patient presents with  . Coronary Artery Disease  . Tachycardia      History of Present Illness: Samuel Gregory is a 74 y.o. male who presents for ongoing assessment and management CAD with recent office visit on 09/08/2014, with complaints of tachycardia.  Other history includes hypertension, hyperlipidemia.  He had recent cataract surgery and was given eyedrops.  Postoperatively, since the eyedrops were started, he began to have episodes of tachycardia and palpitations.  On last office visit, the patient's metoprolol was increased to 25 mg daily, he was to take his blood pressure and heart rate daily.  If he had hypotension.  He would go back to 12.5 mg of metoprolol daily.  A CBC, BMET, TSH was ordered. All labs were within normal limits.  He comes today feeling better.  He does state he feels his heart rate go up at times, but for the most, part it is running 60s to 70s.  He states that he wants to get out and walk and was wondering how fast his heart rate to go when he is doing so.  He also wants to know when he needs to be concerned about his heart rate.  He is medically compliant and is without any further symptoms.   Past Medical History  Diagnosis Date  . Coronary atherosclerosis of native coronary artery     Multivessel status post CABG 2000  . Essential hypertension, benign   . Hyperlipidemia   . Cancer   . Arthritis     Past Surgical History  Procedure Laterality Date  . Coronary artery bypass graft  01/1999    LIMA to LAD, SVG to diagonal, sequential SVG to PDA and PLB  . [place]statectomy  2004    [name]  . Appendectomy      8 yrs ago- Dr Geroge Baseman  . Tonsillectomy      age 60  . Cataract extraction w/phaco Left 07/14/2014    Procedure: CATARACT EXTRACTION PHACO AND INTRAOCULAR  LENS PLACEMENT (IOC);  Surgeon: Tonny Branch, MD;  Location: [facility];  Service: Ophthalmology;  Laterality: Left;  CDE:6.20  . Cataract extraction w/phaco Right 08/11/2014    Procedure: CATARACT EXTRACTION PHACO AND INTRAOCULAR LENS PLACEMENT RIGHT EYE;  Surgeon: Tonny Branch, MD;  Location: [facility];  Service: Ophthalmology;  Laterality: Right;  CDE:10.11     Current Outpatient Prescriptions  Medication Sig Dispense Refill  . aspirin 325 MG tablet Take 325 mg by mouth daily.    . fish oil-omega-3 fatty acids 1000 MG capsule Take 1 g by mouth daily. Alternating from fish oil to flaxseed    . Flaxseed, Linseed, 1000 MG CAPS Take 1,000 mg by mouth daily.    . metoprolol succinate (TOPROL-XL) 25 MG 24 hr tablet TAKE 1 TABLET DAILY OR AS OTHERWISE DIRECTED. 30 tablet 11  . Multiple Vitamin (MULTIVITAMIN WITH MINERALS) TABS Take 1 tablet by mouth daily.    . naproxen sodium (ANAPROX) 220 MG tablet Take 220 mg by mouth daily.    . ramipril (ALTACE) 5 MG capsule TAKE (1) CAPSULE BY MOUTH ONCE DAILY. 30 capsule 11  . rosuvastatin (CRESTOR) 40 MG tablet Take 0.5 tablets (20 mg total) by mouth daily. 30 tablet 11   No current facility-administered medications for this visit.  Allergies:   Sulfa antibiotics    Social History:  The patient  reports that he has never smoked. He has never used smokeless tobacco. He reports that he does not drink alcohol or use illicit drugs.   Family History:  The patient's family history includes Aneurysm in his father; Cancer in his mother; Heart attack in his mother.    ROS: .   All other systems are reviewed and negative.Unless otherwise mentioned in H&P above.   PHYSICAL EXAM: VS:  BP 122/88 mmHg  Pulse 106  Ht 5\' 6"  (1.676 m)  Wt 157 lb 12.8 oz (71.578 kg)  BMI 25.48 kg/m2  SpO2 98% , BMI Body mass index is 25.48 kg/(m^2). GEN: Well nourished, well developed, in no acute distress HEENT: normal Neck: no JVD, carotid bruits, or masses Cardiac: RRR; no  murmurs, rubs, or gallops,no edema  Respiratory:  clear to auscultation bilaterally, normal work of breathing GI: soft, nontender, nondistended, + BS MS: no deformity or atrophy Skin: warm and dry, no rash Neuro:  Strength and sensation are intact Psych: euthymic mood, full affect     Recent Labs: 09/08/2014: BUN 25*; Creatinine 1.09; Hemoglobin 14.3; Platelets 175; Potassium 4.2; Sodium 140; TSH 2.755    Lipid Panel    Component Value Date/Time   CHOL 155 03/01/2013 0735   TRIG 67 03/01/2013 0735   HDL 49 03/01/2013 0735   CHOLHDL 3.2 03/01/2013 0735   VLDL 13 03/01/2013 0735   LDLCALC 93 03/01/2013 0735      Wt Readings from Last 3 Encounters:  10/03/14 157 lb 12.8 oz (71.578 kg)  09/08/14 156 lb (70.761 kg)  04/14/14 156 lb (70.761 kg)      ASSESSMENT AND PLAN:  1. Tachycardia: Heart rate is better controlled with increased dose of metoprolol to 25 mg daily.  He states at times when he is busy or very active.  His heart rate goes up.  I have given him reassurance that this is normal and expected.  I have talked with him about a heart rate greater than 120 sustained, which does not go away after excessive exercise at rest.  If this does occur, he needs to call us.  I do not want to put a monitor on him at this time as his heart rate is essentially normal at home.  He states that his heart rate is anywhere from 65-75, as he checks it often.  2. Hypertension: excellent control of blood pressure.  We will continue current medication regimen.  3. CAD: CAD: History of multivessel disease, status post CABG in 2000.  He is without chest pain.   Current medicines are reviewed at length with the patient today.  The patient does not have  concerns regarding medicines.     Disposition:   FU with in 6 months.   Signed, Jory Sims, NP  10/03/2014 2:31 PM    [facility] [address] [place], [place] [address] Phone: [phone]; Fax: [phone]

## 2014-10-03 NOTE — Progress Notes (Deleted)
Name: Samuel Gregory    DOB: Mar 14, 1941  Age: 74 y.o.  MR#: [id number]       PCP:  Delphina Cahill, MD      Insurance: Payor: MEDICARE / Plan: MEDICARE PART A AND B / Product Type: *No Product type* /   CC:    Chief Complaint  Patient presents with  . Coronary Artery Disease  . Tachycardia    VS Filed Vitals:   10/03/14 1341  BP: 122/88  Pulse: 106  Height: 5' 6"  (1.676 m)  Weight: 157 lb 12.8 oz (71.578 kg)  SpO2: 98%    Weights Current Weight  10/03/14 157 lb 12.8 oz (71.578 kg)  09/08/14 156 lb (70.761 kg)  04/14/14 156 lb (70.761 kg)    Blood Pressure  BP Readings from Last 3 Encounters:  10/03/14 122/88  09/08/14 132/94  08/11/14 116/86     Admit date:  (Not on file) Last encounter with RMR:  09/08/2014   Allergy Sulfa antibiotics  Current Outpatient Prescriptions  Medication Sig Dispense Refill  . aspirin 325 MG tablet Take 325 mg by mouth daily.    . fish oil-omega-3 fatty acids 1000 MG capsule Take 1 g by mouth daily. Alternating from fish oil to flaxseed    . Flaxseed, Linseed, 1000 MG CAPS Take 1,000 mg by mouth daily.    . metoprolol succinate (TOPROL-XL) 25 MG 24 hr tablet TAKE 1 TABLET DAILY OR AS OTHERWISE DIRECTED. 30 tablet 11  . Multiple Vitamin (MULTIVITAMIN WITH MINERALS) TABS Take 1 tablet by mouth daily.    . naproxen sodium (ANAPROX) 220 MG tablet Take 220 mg by mouth daily.    . ramipril (ALTACE) 5 MG capsule TAKE (1) CAPSULE BY MOUTH ONCE DAILY. 30 capsule 11  . rosuvastatin (CRESTOR) 40 MG tablet Take 0.5 tablets (20 mg total) by mouth daily. 30 tablet 11   No current facility-administered medications for this visit.    Discontinued Meds:    Medications Discontinued During This Encounter  Medication Reason  . Timolol Maleate (ISTALOL) 0.5 % (DAILY) SOLN Error  . PROLENSA 0.07 % SOLN Error  . loteprednol (LOTEMAX) 0.5 % ophthalmic suspension Error    Patient Active Problem List   Diagnosis Date Noted  . Essential hypertension 01/23/2013  .  Coronary atherosclerosis of native coronary artery 01/23/2013  . Mixed hyperlipidemia 01/23/2013    LABS    Component Value Date/Time   NA 140 09/08/2014 1504   NA 140 07/08/2014 1530   NA 136 10/30/2007 0400   K 4.2 09/08/2014 1504   K 4.5 07/08/2014 1530   K 3.9 10/30/2007 0400   CL 105 09/08/2014 1504   CL 103 07/08/2014 1530   CL 102 10/30/2007 0400   CO2 27 09/08/2014 1504   CO2 29 07/08/2014 1530   CO2 28 10/30/2007 0400   GLUCOSE 94 09/08/2014 1504   GLUCOSE 101* 07/08/2014 1530   GLUCOSE 166* 10/30/2007 0400   BUN 25* 09/08/2014 1504   BUN 26* 07/08/2014 1530   BUN 12 10/30/2007 0400   CREATININE 1.09 09/08/2014 1504   CREATININE 1.49* 07/08/2014 1530   CREATININE 1.20 10/30/2007 0400   CREATININE 1.15 10/29/2007 2036   CALCIUM 9.1 09/08/2014 1504   CALCIUM 9.3 07/08/2014 1530   CALCIUM 8.5 10/30/2007 0400   GFRNONAA 45* 07/08/2014 1530   GFRNONAA >60 10/30/2007 0400   GFRNONAA >60 10/29/2007 2036   GFRAA 52* 07/08/2014 1530   GFRAA  10/30/2007 0400    >60  The eGFR has been calculated using the MDRD equation. This calculation has not been validated in all clinical   GFRAA  10/29/2007 2036    >60        The eGFR has been calculated using the MDRD equation. This calculation has not been validated in all clinical   CMP     Component Value Date/Time   NA 140 09/08/2014 1504   K 4.2 09/08/2014 1504   CL 105 09/08/2014 1504   CO2 27 09/08/2014 1504   GLUCOSE 94 09/08/2014 1504   BUN 25* 09/08/2014 1504   CREATININE 1.09 09/08/2014 1504   CREATININE 1.49* 07/08/2014 1530   CALCIUM 9.1 09/08/2014 1504   PROT 6.4 10/29/2007 2036   ALBUMIN 4.0 10/29/2007 2036   AST 21 10/29/2007 2036   ALT 23 10/29/2007 2036   ALKPHOS 49 10/29/2007 2036   BILITOT 0.8 10/29/2007 2036   GFRNONAA 45* 07/08/2014 1530   GFRAA 52* 07/08/2014 1530       Component Value Date/Time   WBC 4.3 09/08/2014 1504   WBC 7.3 10/30/2007 0400   WBC 8.7 10/29/2007 2036    HGB 14.3 09/08/2014 1504   HGB 14.2 07/08/2014 1530   HGB 13.0 10/30/2007 0400   HCT 42.2 09/08/2014 1504   HCT 41.7 07/08/2014 1530   HCT 36.7* 10/30/2007 0400   MCV 89.2 09/08/2014 1504   MCV 89.3 10/30/2007 0400   MCV 88.9 10/29/2007 2036    Lipid Panel     Component Value Date/Time   CHOL 155 03/01/2013 0735   TRIG 67 03/01/2013 0735   HDL 49 03/01/2013 0735   CHOLHDL 3.2 03/01/2013 0735   VLDL 13 03/01/2013 0735   LDLCALC 93 03/01/2013 0735    ABG No results found for: PHART, PCO2ART, PO2ART, HCO3, TCO2, ACIDBASEDEF, O2SAT   Lab Results  Component Value Date   TSH 2.755 09/08/2014   BNP (last 3 results) No results for input(s): BNP in the last 8760 hours.  ProBNP (last 3 results) No results for input(s): PROBNP in the last 8760 hours.  Cardiac Panel (last 3 results) No results for input(s): CKTOTAL, CKMB, TROPONINI, RELINDX in the last 72 hours.  Iron/TIBC/Ferritin/ %Sat No results found for: IRON, TIBC, FERRITIN, IRONPCTSAT   EKG Orders placed or performed in visit on 09/08/14  . EKG 12-Lead     Prior Assessment and Plan Problem List as of 10/03/2014      Cardiovascular and Mediastinum   Essential hypertension   Last Assessment & Plan 09/08/2014 Office Visit Written 09/08/2014  3:10 PM by Lendon Colonel, NP    BP is controlled currently. No other changes at this time.       Coronary atherosclerosis of native coronary artery   Last Assessment & Plan 09/08/2014 Office Visit Written 09/08/2014  3:10 PM by Lendon Colonel, NP    He is tachycardic today with elevated HR. He is now on nasal steroids that are new for him. He has just had cataract surgery and is finishing up his eye gtts.  I will increase the metoprolol to 25 mg daily I have asked him to take his BP and HR daily and record. If his HR is <60 or BP is <110/0 he is to call us and go back to 12.,5 mg daily. Will see him in a month. I will check labs to include BMET, CBC and TSH.         Other    Mixed hyperlipidemia   Last Assessment &  Plan 04/14/2014 Office Visit Written 04/14/2014  4:17 PM by Satira Sark, MD    Try and cut Crestor back to 20 mg daily to see if he notices any improvement in leg discomfort. Followup lipid panel with Dr. Nevada Crane in 3 months.          Imaging: No results found.

## 2014-10-03 NOTE — Patient Instructions (Signed)
Your physician wants you to follow-up in: 6 months with Jory Sims NP You will receive a reminder letter in the mail two months in advance. If you don't receive a letter, please call our office to schedule the follow-up appointment.    Your physician recommends that you continue on your current medications as directed. Please refer to the Current Medication list given to you today.     Thank you for choosing [facility]!

## 2014-10-27 ENCOUNTER — Encounter (INDEPENDENT_AMBULATORY_CARE_PROVIDER_SITE_OTHER): Payer: Self-pay | Admitting: *Deleted

## 2014-11-18 ENCOUNTER — Other Ambulatory Visit (INDEPENDENT_AMBULATORY_CARE_PROVIDER_SITE_OTHER): Payer: Self-pay | Admitting: *Deleted

## 2014-11-18 DIAGNOSIS — Z1211 Encounter for screening for malignant neoplasm of colon: Secondary | ICD-10-CM

## 2014-11-28 ENCOUNTER — Telehealth (INDEPENDENT_AMBULATORY_CARE_PROVIDER_SITE_OTHER): Payer: Self-pay | Admitting: *Deleted

## 2014-11-28 DIAGNOSIS — Z1211 Encounter for screening for malignant neoplasm of colon: Secondary | ICD-10-CM

## 2014-11-28 MED ORDER — PEG 3350-KCL-NA BICARB-NACL 420 G PO SOLR
4000.0000 mL | Freq: Once | ORAL | Status: DC
Start: 2014-11-28 — End: 2015-01-14

## 2014-11-28 NOTE — Telephone Encounter (Signed)
Patient needs trilyte 

## 2014-12-01 ENCOUNTER — Telehealth: Payer: Self-pay | Admitting: Adult Health

## 2014-12-01 NOTE — Telephone Encounter (Signed)
Patient walked in to the clinic wanting to discuss his medication metoprolol succinate (TOPROL-XL) 25 MG 24 hr He read article and is really concerned about it.  Would like for K. Lawrence to read the article and let him know if he should continue taking it

## 2014-12-01 NOTE — Telephone Encounter (Signed)
Pt dropped off article discussing generic metorolol from [place] he wants Industrial/product designer to read.patient knows she is off all week.i have placed this in her folder for review.

## 2014-12-09 NOTE — Telephone Encounter (Signed)
Per  Ms Purcell Nails NP, drugs made and purchased in the [place] are strictly regulated by FDA.Pt states he feels comfortable taking medication as he has for past 15 yrs

## 2014-12-16 ENCOUNTER — Telehealth (INDEPENDENT_AMBULATORY_CARE_PROVIDER_SITE_OTHER): Payer: Self-pay | Admitting: *Deleted

## 2014-12-16 NOTE — Telephone Encounter (Signed)
Referring MD/PCP: hall   Procedure: tcs  Reason/Indication:  screening  Has patient had this procedure before?  Yes, 2006 -- epic  If so, when, by whom and where?    Is there a family history of colon cancer?  no  Who?  What age when diagnosed?    Is patient diabetic?   no      Does patient have prosthetic heart valve?  no  Do you have a pacemaker?  no  Has patient ever had endocarditis? no  Has patient had joint replacement within last 12 months?  no  Does patient tend to be constipated or take laxatives? no  Is patient on Coumadin, Plavix and/or Aspirin? yes  Medications: asa 325 mg daily, metoprolol 25 mg daily, ramipril 5 mg daily, crestor 40 mg 1/2 tab daily, multi vit, fish oil  Allergies: sulfur, latex  Medication Adjustment: asa 2 days  Procedure date & time: 01/14/15 at 730

## 2014-12-17 NOTE — Telephone Encounter (Signed)
agree

## 2015-01-14 ENCOUNTER — Ambulatory Visit (HOSPITAL_COMMUNITY)
Admission: RE | Admit: 2015-01-14 | Discharge: 2015-01-14 | Disposition: A | Discharge status: Home / Self Care | Payer: Medicare Other | Source: Ambulatory Visit | Attending: Internal Medicine | Admitting: Internal Medicine

## 2015-01-14 ENCOUNTER — Encounter (HOSPITAL_COMMUNITY)
Admission: RE | Disposition: A | Discharge status: Home / Self Care | Payer: Self-pay | Source: Ambulatory Visit | Attending: Internal Medicine

## 2015-01-14 DIAGNOSIS — I10 Essential (primary) hypertension: Secondary | ICD-10-CM | POA: Insufficient documentation

## 2015-01-14 DIAGNOSIS — Z791 Long term (current) use of non-steroidal anti-inflammatories (NSAID): Secondary | ICD-10-CM | POA: Insufficient documentation

## 2015-01-14 DIAGNOSIS — R001 Bradycardia, unspecified: Secondary | ICD-10-CM | POA: Diagnosis not present

## 2015-01-14 DIAGNOSIS — Z1211 Encounter for screening for malignant neoplasm of colon: Secondary | ICD-10-CM | POA: Diagnosis not present

## 2015-01-14 DIAGNOSIS — M1990 Unspecified osteoarthritis, unspecified site: Secondary | ICD-10-CM | POA: Diagnosis not present

## 2015-01-14 DIAGNOSIS — K5730 Diverticulosis of large intestine without perforation or abscess without bleeding: Secondary | ICD-10-CM | POA: Insufficient documentation

## 2015-01-14 DIAGNOSIS — I2510 Atherosclerotic heart disease of native coronary artery without angina pectoris: Secondary | ICD-10-CM | POA: Diagnosis not present

## 2015-01-14 DIAGNOSIS — D120 Benign neoplasm of cecum: Secondary | ICD-10-CM | POA: Insufficient documentation

## 2015-01-14 DIAGNOSIS — Z79899 Other long term (current) drug therapy: Secondary | ICD-10-CM | POA: Insufficient documentation

## 2015-01-14 DIAGNOSIS — K648 Other hemorrhoids: Secondary | ICD-10-CM | POA: Insufficient documentation

## 2015-01-14 DIAGNOSIS — Z7982 Long term (current) use of aspirin: Secondary | ICD-10-CM | POA: Insufficient documentation

## 2015-01-14 DIAGNOSIS — Z951 Presence of aortocoronary bypass graft: Secondary | ICD-10-CM | POA: Insufficient documentation

## 2015-01-14 DIAGNOSIS — E785 Hyperlipidemia, unspecified: Secondary | ICD-10-CM | POA: Diagnosis not present

## 2015-01-14 DIAGNOSIS — Z859 Personal history of malignant neoplasm, unspecified: Secondary | ICD-10-CM | POA: Insufficient documentation

## 2015-01-14 HISTORY — PX: COLONOSCOPY: SHX5424

## 2015-01-14 SURGERY — COLONOSCOPY
Anesthesia: Moderate Sedation

## 2015-01-14 MED ORDER — MEPERIDINE HCL 50 MG/ML IJ SOLN
INTRAMUSCULAR | Status: DC | PRN
  Administered 2015-01-14 (×2): 25 mg via INTRAVENOUS

## 2015-01-14 MED ORDER — SODIUM CHLORIDE 0.9 % IV SOLN
INTRAVENOUS | Status: DC
Start: 2015-01-14 — End: 2015-01-14
  Administered 2015-01-14: 1000 mL via INTRAVENOUS

## 2015-01-14 MED ORDER — STERILE WATER FOR IRRIGATION IR SOLN
Status: DC | PRN
  Administered 2015-01-14: 08:00:00

## 2015-01-14 MED ORDER — MEPERIDINE HCL 50 MG/ML IJ SOLN
INTRAMUSCULAR | Status: AC
  Filled 2015-01-14: qty 1

## 2015-01-14 MED ORDER — MIDAZOLAM HCL 5 MG/5ML IJ SOLN
INTRAMUSCULAR | Status: DC | PRN
  Administered 2015-01-14: 1 mg via INTRAVENOUS
  Administered 2015-01-14: 2 mg via INTRAVENOUS
  Administered 2015-01-14: 1 mg via INTRAVENOUS

## 2015-01-14 MED ORDER — MIDAZOLAM HCL 5 MG/5ML IJ SOLN
INTRAMUSCULAR | Status: AC
  Filled 2015-01-14: qty 10

## 2015-01-14 NOTE — H&P (Signed)
Samuel Gregory is an 74 y.o. male.   Chief Complaint: Patient is here for colonoscopy. HPI: 74 year old Caucasian male who is here for screening colonoscopy. He denies abdominal pain change in bowel habits or rectal bleeding. Last colonoscopy was normal in April 2006. Family history is negative for CRC.  Past Medical History  Diagnosis Date  . Coronary atherosclerosis of native coronary artery     Multivessel status post CABG 2000  . Essential hypertension, benign   . Hyperlipidemia   . Cancer   . Arthritis     Past Surgical History  Procedure Laterality Date  . Coronary artery bypass graft  01/1999    LIMA to LAD, SVG to diagonal, sequential SVG to PDA and PLB  . Prostatectomy  2004    [name]  . Appendectomy      8 yrs ago- Dr Geroge Baseman  . Tonsillectomy      age 3  . Cataract extraction w/phaco Left 07/14/2014    Procedure: CATARACT EXTRACTION PHACO AND INTRAOCULAR LENS PLACEMENT (IOC);  Surgeon: Tonny Branch, MD;  Location: [facility];  Service: Ophthalmology;  Laterality: Left;  CDE:6.20  . Cataract extraction w/phaco Right 08/11/2014    Procedure: CATARACT EXTRACTION PHACO AND INTRAOCULAR LENS PLACEMENT RIGHT EYE;  Surgeon: Tonny Branch, MD;  Location: [facility];  Service: Ophthalmology;  Laterality: Right;  CDE:10.11    Family History  Problem Relation Age of Onset  . Heart attack Mother   . Cancer Mother   . Aneurysm Father    Social History:  reports that he has never smoked. He has never used smokeless tobacco. He reports that he does not drink alcohol or use illicit drugs.  Allergies:  Allergies  Allergen Reactions  . Latex Rash  . Sulfa Antibiotics Rash    Medications Prior to Admission  Medication Sig Dispense Refill  . aspirin 325 MG tablet Take 325 mg by mouth daily.    . fish oil-omega-3 fatty acids 1000 MG capsule Take 1 g by mouth daily.     . metoprolol succinate (TOPROL-XL) 25 MG 24 hr tablet TAKE 1 TABLET DAILY OR AS OTHERWISE DIRECTED. 30 tablet 11  .  Multiple Vitamin (MULTIVITAMIN WITH MINERALS) TABS Take 1 tablet by mouth daily.    . naproxen sodium (ANAPROX) 220 MG tablet Take 220 mg by mouth daily.    . polyethylene glycol-electrolytes (NULYTELY/GOLYTELY) 420 G solution Take 4,000 mLs by mouth once. 4000 mL 0  . ramipril (ALTACE) 5 MG capsule TAKE (1) CAPSULE BY MOUTH ONCE DAILY. 30 capsule 11  . rosuvastatin (CRESTOR) 40 MG tablet Take 0.5 tablets (20 mg total) by mouth daily. 30 tablet 11    No results found for this or any previous visit (from the past 48 hour(s)). No results found.  ROS  Blood pressure 142/81, pulse 61, temperature 97.3 F (36.3 C), temperature source Oral, resp. rate 18, SpO2 100 %. Physical Exam  Constitutional: He appears well-developed and well-nourished.  HENT:  Mouth/Throat: Oropharynx is clear and moist.  Eyes: Conjunctivae are normal. No scleral icterus.  Neck: No thyromegaly present.  Cardiovascular: Normal rate, regular rhythm and normal heart sounds.   No murmur heard. Respiratory: Effort normal and breath sounds normal.  GI: Soft. He exhibits no distension and no mass. There is no tenderness.  Musculoskeletal: He exhibits no edema.  Lymphadenopathy:    He has no cervical adenopathy.  Neurological: He is alert.  Skin: Skin is warm and dry.     Assessment/Plan Average risk screening  colonoscopy.  Yareli Carthen U 01/14/2015, 7:37 AM

## 2015-01-14 NOTE — Progress Notes (Addendum)
ECG shows sinus Bradycardia. HR=56. Dr. Laural Golden aware. No new orders at this time.

## 2015-01-14 NOTE — Discharge Instructions (Signed)
Resume aspirin on 01/15/2015. Resume other medications as before. High fiber diet. No driving for 24 hours. Physician will call with biopsy results.  Colonoscopy, Care After Refer to this sheet in the next few weeks. These instructions provide you with information on caring for yourself after your procedure. Your health care provider may also give you more specific instructions. Your treatment has been planned according to current medical practices, but problems sometimes occur. Call your health care provider if you have any problems or questions after your procedure. WHAT TO EXPECT AFTER THE PROCEDURE  After your procedure, it is typical to have the following:  A small amount of blood in your stool.  Moderate amounts of gas and mild abdominal cramping or bloating. HOME CARE INSTRUCTIONS  Do not drive, operate machinery, or sign important documents for 24 hours.  You may shower and resume your regular physical activities, but move at a slower pace for the first 24 hours.  Take frequent rest periods for the first 24 hours.  Walk around or put a warm pack on your abdomen to help reduce abdominal cramping and bloating.  Drink enough fluids to keep your urine clear or pale yellow.  You may resume your normal diet as instructed by your health care provider. Avoid heavy or fried foods that are hard to digest.  Avoid drinking alcohol for 24 hours or as instructed by your health care provider.  Only take over-the-counter or prescription medicines as directed by your health care provider.  If a tissue sample (biopsy) was taken during your procedure:  Do not take aspirin or blood thinners for 7 days, or as instructed by your health care provider.  Do not drink alcohol for 7 days, or as instructed by your health care provider.  Eat soft foods for the first 24 hours. SEEK MEDICAL CARE IF: You have persistent spotting of blood in your stool 2-3 days after the procedure. SEEK IMMEDIATE  MEDICAL CARE IF:  You have more than a small spotting of blood in your stool.  You pass large blood clots in your stool.  Your abdomen is swollen (distended).  You have nausea or vomiting.  You have a fever.  You have increasing abdominal pain that is not relieved with medicine. Document Released: 03/01/2004 Document Revised: 05/08/2013 Document Reviewed: 03/25/2013 [facility] Patient Information 2015 [place], [place]. This information is not intended to replace advice given to you by your health care provider. Make sure you discuss any questions you have with your health care provider.  Colon Polyps Polyps are lumps of extra tissue growing inside the body. Polyps can grow in the large intestine (colon). Most colon polyps are noncancerous (benign). However, some colon polyps can become cancerous over time. Polyps that are larger than a pea may be harmful. To be safe, caregivers remove and test all polyps. CAUSES  Polyps form when mutations in the genes cause your cells to grow and divide even though no more tissue is needed. RISK FACTORS There are a number of risk factors that can increase your chances of getting colon polyps. They include:  Being older than 50 years.  Family history of colon polyps or colon cancer.  Long-term colon diseases, such as colitis or Crohn disease.  Being overweight.  Smoking.  Being inactive.  Drinking too much alcohol. SYMPTOMS  Most small polyps do not cause symptoms. If symptoms are present, they may include:  Blood in the stool. The stool may look dark red or black.  Constipation or diarrhea that  lasts longer than 1 week. DIAGNOSIS People often do not know they have polyps until their caregiver finds them during a regular checkup. Your caregiver can use 4 tests to check for polyps:  Digital rectal exam. The caregiver wears gloves and feels inside the rectum. This test would find polyps only in the rectum.  Barium enema. The caregiver puts a  liquid called barium into your rectum before taking X-rays of your colon. Barium makes your colon look white. Polyps are dark, so they are easy to see in the X-ray pictures.  Sigmoidoscopy. A thin, flexible tube (sigmoidoscope) is placed into your rectum. The sigmoidoscope has a light and tiny camera in it. The caregiver uses the sigmoidoscope to look at the last third of your colon.  Colonoscopy. This test is like sigmoidoscopy, but the caregiver looks at the entire colon. This is the most common method for finding and removing polyps. TREATMENT  Any polyps will be removed during a sigmoidoscopy or colonoscopy. The polyps are then tested for cancer. PREVENTION  To help lower your risk of getting more colon polyps:  Eat plenty of fruits and vegetables. Avoid eating fatty foods.  Do not smoke.  Avoid drinking alcohol.  Exercise every day.  Lose weight if recommended by your caregiver.  Eat plenty of calcium and folate. Foods that are rich in calcium include milk, cheese, and broccoli. Foods that are rich in folate include chickpeas, kidney beans, and spinach. HOME CARE INSTRUCTIONS Keep all follow-up appointments as directed by your caregiver. You may need periodic exams to check for polyps. SEEK MEDICAL CARE IF: You notice bleeding during a bowel movement. Document Released: 04/13/2004 Document Revised: 10/10/2011 Document Reviewed: 09/27/2011 [facility] Patient Information 2015 [place], [place]. This information is not intended to replace advice given to you by your health care provider. Make sure you discuss any questions you have with your health care provider.  High-Fiber Diet Fiber is found in fruits, vegetables, and grains. A high-fiber diet encourages the addition of more whole grains, legumes, fruits, and vegetables in your diet. The recommended amount of fiber for adult males is 38 g per day. For adult females, it is 25 g per day. Pregnant and lactating women should get 28 g of  fiber per day. If you have a digestive or bowel problem, ask your caregiver for advice before adding high-fiber foods to your diet. Eat a variety of high-fiber foods instead of only a select few type of foods.  PURPOSE  To increase stool bulk.  To make bowel movements more regular to prevent constipation.  To lower cholesterol.  To prevent overeating. WHEN IS THIS DIET USED?  It may be used if you have constipation and hemorrhoids.  It may be used if you have uncomplicated diverticulosis (intestine condition) and irritable bowel syndrome.  It may be used if you need help with weight management.  It may be used if you want to add it to your diet as a protective measure against atherosclerosis, diabetes, and cancer. SOURCES OF FIBER  Whole-grain breads and cereals.  Fruits, such as apples, oranges, bananas, berries, prunes, and pears.  Vegetables, such as green peas, carrots, sweet potatoes, beets, broccoli, cabbage, spinach, and artichokes.  Legumes, such split peas, soy, lentils.  Almonds. FIBER CONTENT IN FOODS Starches and Grains / Dietary Fiber (g)  Cheerios, 1 cup / 3 g  Corn Flakes cereal, 1 cup / 0.7 g  Rice crispy treat cereal, 1 cup / 0.3 g  Instant oatmeal (cooked),  cup /  2 g  Frosted wheat cereal, 1 cup / 5.1 g  Blixt, long-grain rice (cooked), 1 cup / 3.5 g  White, long-grain rice (cooked), 1 cup / 0.6 g  Enriched macaroni (cooked), 1 cup / 2.5 g Legumes / Dietary Fiber (g)  Baked beans (canned, plain, or vegetarian),  cup / 5.2 g  Kidney beans (canned),  cup / 6.8 g  Pinto beans (cooked),  cup / 5.5 g Breads and Crackers / Dietary Fiber (g)  Plain or honey graham crackers, 2 squares / 0.7 g  Saltine crackers, 3 squares / 0.3 g  Plain, salted pretzels, 10 pieces / 1.8 g  Whole-wheat bread, 1 slice / 1.9 g  White bread, 1 slice / 0.7 g  Raisin bread, 1 slice / 1.2 g  Plain bagel, 3 oz / 2 g  Flour tortilla, 1 oz / 0.9 g  Corn  tortilla, 1 small / 1.5 g  Hamburger or hotdog bun, 1 small / 0.9 g Fruits / Dietary Fiber (g)  Apple with skin, 1 medium / 4.4 g  Sweetened applesauce,  cup / 1.5 g  Banana,  medium / 1.5 g  Grapes, 10 grapes / 0.4 g  Orange, 1 small / 2.3 g  Raisin, 1.5 oz / 1.6 g  Melon, 1 cup / 1.4 g Vegetables / Dietary Fiber (g)  Green beans (canned),  cup / 1.3 g  Carrots (cooked),  cup / 2.3 g  Broccoli (cooked),  cup / 2.8 g  Peas (cooked),  cup / 4.4 g  Mashed potatoes,  cup / 1.6 g  Lettuce, 1 cup / 0.5 g  Corn (canned),  cup / 1.6 g  Tomato,  cup / 1.1 g Document Released: 07/18/2005 Document Revised: 01/17/2012 Document Reviewed: 10/20/2011 [employer] Patient Information 2015 [place], [place]. This information is not intended to replace advice given to you by your health care provider. Make sure you discuss any questions you have with your health care provider.

## 2015-01-14 NOTE — Op Note (Signed)
COLONOSCOPY PROCEDURE REPORT  PATIENT:  Samuel Gregory  MR#:  [id number] Birthdate:  April 19, 1941, 74 y.o., male Endoscopist:  Dr. Rogene Houston, MD Referred By:  Dr. Delphina Cahill, MD Procedure Date: 01/14/2015  Procedure:   Colonoscopy  Indications:  Patient is 74 year old Caucasian male who is here for average risk screening colonoscopy.  Informed Consent:  The procedure and risks were reviewed with the patient and informed consent was obtained.  Medications:  Demerol 50 mg IV Versed 4 mg IV  Description of procedure:  After a digital rectal exam was performed, that colonoscope was advanced from the anus through the rectum and colon to the area of the cecum, ileocecal valve and appendiceal orifice. The cecum was deeply intubated. These structures were well-seen and photographed for the record. From the level of the cecum and ileocecal valve, the scope was slowly and cautiously withdrawn. The mucosal surfaces were carefully surveyed utilizing scope tip to flexion to facilitate fold flattening as needed. The scope was pulled down into the rectum where a thorough exam including retroflexion was performed. Terminal ileum was also examined.  Findings:  Prep excellent. Normal mucosa of terminal ileum. Small polyp ablated via cold biopsy from cecum. It was located across ileocecal valve. Few small diverticula at sigmoid colon. Normal rectal mucosa. Small hemorrhoids above the dentate line   Therapeutic/Diagnostic Maneuvers Performed:  See above  Complications:  None  Cecal Withdrawal Time:  14 minutes  Impression:  Normal mucosa of terminal ileum. Small cecal polyp ablated via cold biopsy. Mild sigmoid colon diverticulosis. Internal hemorrhoids.  Recommendations:  Standard instructions given. High-fiber diet. I will contact patient with biopsy results and further recommendations.  REHMAN,NAJEEB U  01/14/2015 8:19 AM  CC: Dr. Delphina Cahill, MD & Dr. Rayne Du ref. provider  found

## 2015-01-15 ENCOUNTER — Encounter (HOSPITAL_COMMUNITY): Payer: Self-pay | Admitting: Internal Medicine

## 2015-01-15 ENCOUNTER — Ambulatory Visit (INDEPENDENT_AMBULATORY_CARE_PROVIDER_SITE_OTHER): Payer: Medicare Other | Admitting: Otolaryngology

## 2015-01-15 DIAGNOSIS — H6123 Impacted cerumen, bilateral: Secondary | ICD-10-CM

## 2015-01-15 DIAGNOSIS — H903 Sensorineural hearing loss, bilateral: Secondary | ICD-10-CM | POA: Diagnosis not present

## 2015-01-23 ENCOUNTER — Encounter (INDEPENDENT_AMBULATORY_CARE_PROVIDER_SITE_OTHER): Payer: Self-pay | Admitting: *Deleted

## 2015-01-26 ENCOUNTER — Other Ambulatory Visit: Payer: Self-pay

## 2015-03-12 ENCOUNTER — Other Ambulatory Visit: Payer: Self-pay | Admitting: Cardiovascular Disease

## 2015-03-12 NOTE — Telephone Encounter (Signed)
REFILL 

## 2015-07-07 DIAGNOSIS — Z23 Encounter for immunization: Secondary | ICD-10-CM | POA: Diagnosis not present

## 2015-08-03 DIAGNOSIS — J40 Bronchitis, not specified as acute or chronic: Secondary | ICD-10-CM | POA: Diagnosis not present

## 2015-08-03 DIAGNOSIS — J029 Acute pharyngitis, unspecified: Secondary | ICD-10-CM | POA: Diagnosis not present

## 2015-08-03 DIAGNOSIS — J069 Acute upper respiratory infection, unspecified: Secondary | ICD-10-CM | POA: Diagnosis not present

## 2015-08-12 ENCOUNTER — Other Ambulatory Visit: Payer: Self-pay | Admitting: Adult Health

## 2015-09-07 ENCOUNTER — Other Ambulatory Visit: Payer: Self-pay | Admitting: Cardiology

## 2015-09-07 ENCOUNTER — Other Ambulatory Visit: Payer: Self-pay | Admitting: Cardiovascular Disease

## 2015-10-01 DIAGNOSIS — X32XXXD Exposure to sunlight, subsequent encounter: Secondary | ICD-10-CM | POA: Diagnosis not present

## 2015-10-01 DIAGNOSIS — Z1283 Encounter for screening for malignant neoplasm of skin: Secondary | ICD-10-CM | POA: Diagnosis not present

## 2015-10-01 DIAGNOSIS — L820 Inflamed seborrheic keratosis: Secondary | ICD-10-CM | POA: Diagnosis not present

## 2015-10-01 DIAGNOSIS — D225 Melanocytic nevi of trunk: Secondary | ICD-10-CM | POA: Diagnosis not present

## 2015-10-01 DIAGNOSIS — L570 Actinic keratosis: Secondary | ICD-10-CM | POA: Diagnosis not present

## 2015-10-02 ENCOUNTER — Other Ambulatory Visit: Payer: Self-pay | Admitting: Cardiology

## 2015-10-02 ENCOUNTER — Other Ambulatory Visit: Payer: Self-pay | Admitting: Cardiovascular Disease

## 2015-12-02 ENCOUNTER — Other Ambulatory Visit: Payer: Self-pay | Admitting: Cardiovascular Disease

## 2015-12-02 NOTE — Telephone Encounter (Signed)
REFILL 

## 2015-12-07 DIAGNOSIS — G5693 Unspecified mononeuropathy of bilateral upper limbs: Secondary | ICD-10-CM | POA: Diagnosis not present

## 2015-12-24 DIAGNOSIS — I2510 Atherosclerotic heart disease of native coronary artery without angina pectoris: Secondary | ICD-10-CM | POA: Diagnosis not present

## 2015-12-24 DIAGNOSIS — Z125 Encounter for screening for malignant neoplasm of prostate: Secondary | ICD-10-CM | POA: Diagnosis not present

## 2015-12-24 DIAGNOSIS — E785 Hyperlipidemia, unspecified: Secondary | ICD-10-CM | POA: Diagnosis not present

## 2015-12-24 DIAGNOSIS — G629 Polyneuropathy, unspecified: Secondary | ICD-10-CM | POA: Diagnosis not present

## 2015-12-24 DIAGNOSIS — M542 Cervicalgia: Secondary | ICD-10-CM | POA: Diagnosis not present

## 2015-12-29 DIAGNOSIS — E782 Mixed hyperlipidemia: Secondary | ICD-10-CM | POA: Diagnosis not present

## 2015-12-29 DIAGNOSIS — G589 Mononeuropathy, unspecified: Secondary | ICD-10-CM | POA: Diagnosis not present

## 2015-12-29 DIAGNOSIS — R7301 Impaired fasting glucose: Secondary | ICD-10-CM | POA: Diagnosis not present

## 2015-12-29 DIAGNOSIS — E039 Hypothyroidism, unspecified: Secondary | ICD-10-CM | POA: Diagnosis not present

## 2016-01-07 ENCOUNTER — Other Ambulatory Visit: Payer: Self-pay | Admitting: Cardiovascular Disease

## 2016-01-11 ENCOUNTER — Encounter: Payer: Self-pay | Admitting: Cardiology

## 2016-01-11 ENCOUNTER — Ambulatory Visit (INDEPENDENT_AMBULATORY_CARE_PROVIDER_SITE_OTHER): Payer: Medicare Other | Admitting: Cardiology

## 2016-01-11 VITALS — BP 114/76 | HR 74 | Ht 67.0 in | Wt 155.0 lb

## 2016-01-11 DIAGNOSIS — I2510 Atherosclerotic heart disease of native coronary artery without angina pectoris: Secondary | ICD-10-CM

## 2016-01-11 DIAGNOSIS — I10 Essential (primary) hypertension: Secondary | ICD-10-CM

## 2016-01-11 DIAGNOSIS — E782 Mixed hyperlipidemia: Secondary | ICD-10-CM

## 2016-01-11 MED ORDER — ROSUVASTATIN CALCIUM 20 MG PO TABS: 20.0000 mg | ORAL_TABLET | Freq: Every day | ORAL | Status: DC

## 2016-01-11 NOTE — Progress Notes (Signed)
Cardiology Office Note  Date: 01/11/2016   ID: MCCRAY ANGERMEIER, DOB 1941/07/26, MRN [id number]  PCP: Wende Neighbors, MD  Primary Cardiologist: Rozann Lesches, MD   Chief Complaint  Patient presents with  . Coronary Artery Disease    History of Present Illness: Samuel Gregory is a 75 y.o. male last seen by Ms. Lawrence NP in March 2016. He presents for a routine follow-up visit. Continues to work full time with his [employer]. He does not report any major functional decline or angina symptoms over the last year.  I reviewed his medications which are outlined below in stable from a cardiac perspective. He had recent lab work with Dr. Nevada Crane about 2 weeks ago which we are requesting. Crestor dose had to be cut back to 20 mg daily which he is tolerating better in terms of arthralgias. I reviewed his ECG today which shows normal sinus rhythm.  His last stress test was in 2012 as outlined below. We did discuss obtaining a follow-up surveillance evaluation. He would like to hold off for now since he is pending urological evaluation for an elevated PSA.  Past Medical History  Diagnosis Date  . Coronary atherosclerosis of native coronary artery     Multivessel status post CABG 2000  . Essential hypertension, benign   . Hyperlipidemia   . Cancer ([place])   . Arthritis     Past Surgical History  Procedure Laterality Date  . Coronary artery bypass graft  01/1999    LIMA to LAD, SVG to diagonal, sequential SVG to PDA and PLB  . Prostatectomy  2004    [name]  . Appendectomy      8 yrs ago- Dr Geroge Baseman  . Tonsillectomy      age 37  . Cataract extraction w/phaco Left 07/14/2014    Procedure: CATARACT EXTRACTION PHACO AND INTRAOCULAR LENS PLACEMENT (IOC);  Surgeon: Tonny Branch, MD;  Location: [facility];  Service: Ophthalmology;  Laterality: Left;  CDE:6.20  . Cataract extraction w/phaco Right 08/11/2014    Procedure: CATARACT EXTRACTION PHACO AND INTRAOCULAR LENS PLACEMENT RIGHT EYE;  Surgeon:  Tonny Branch, MD;  Location: [facility];  Service: Ophthalmology;  Laterality: Right;  CDE:10.11  . Colonoscopy N/A 01/14/2015    Procedure: COLONOSCOPY;  Surgeon: Rogene Houston, MD;  Location: [facility];  Service: Endoscopy;  Laterality: N/A;  730    Current Outpatient Prescriptions  Medication Sig Dispense Refill  . aspirin 325 MG tablet Take 325 mg by mouth daily.    . CRESTOR 40 MG tablet TAKE (1/2) TABLET BY MOUTH DAILY. 30 tablet 6  . fish oil-omega-3 fatty acids 1000 MG capsule Take 1 g by mouth daily.     . metoprolol succinate (TOPROL-XL) 25 MG 24 hr tablet TAKE (1) TABLET BY MOUTH DAILY OR AS OTHERWISE DIRECTED. 30 tablet 3  . Multiple Vitamin (MULTIVITAMIN WITH MINERALS) TABS Take 1 tablet by mouth daily.    . ramipril (ALTACE) 5 MG capsule TAKE (1) CAPSULE BY MOUTH ONCE DAILY. 30 capsule 1   No current facility-administered medications for this visit.   Allergies:  Latex and Sulfa antibiotics   Social History: The patient  reports that he has never smoked. He has never used smokeless tobacco. He reports that he does not drink alcohol or use illicit drugs.   ROS:  Please see the history of present illness. Otherwise, complete review of systems is positive for none.  All other systems are reviewed and negative.   Physical  Exam: VS:  BP 114/76 mmHg  Pulse 74  Ht 5\' 7"  (1.702 m)  Wt 155 lb (70.308 kg)  BMI 24.27 kg/m2  SpO2 98%, BMI Body mass index is 24.27 kg/(m^2).  Wt Readings from Last 3 Encounters:  01/11/16 155 lb (70.308 kg)  01/14/15 155 lb (70.308 kg)  10/03/14 157 lb 12.8 oz (71.578 kg)    General: Patient appears comfortable at rest. HEENT: Conjunctiva and lids normal, oropharynx clear with moist mucosa. Neck: Supple, no elevated JVP or carotid bruits, no thyromegaly. Lungs: Clear to auscultation, nonlabored breathing at rest. Cardiac: Regular rate and rhythm, no S3 or significant systolic murmur, no pericardial rub. Abdomen: Soft, nontender, no hepatomegaly,  bowel sounds present, no guarding or rebound. Extremities: No pitting edema, distal pulses 2+. Skin: Warm and dry. Musculoskeletal: No kyphosis. Neuropsychiatric: Alert and oriented x3, affect grossly appropriate.  ECG: I personally reviewed the prior tracing from 01/14/2015 which showed sinus bradycardia.  Recent Labwork:    Component Value Date/Time   CHOL 155 03/01/2013 0735   TRIG 67 03/01/2013 0735   HDL 49 03/01/2013 0735   CHOLHDL 3.2 03/01/2013 0735   VLDL 13 03/01/2013 0735   LDLCALC 93 03/01/2013 0735    Other Studies Reviewed Today:  Exercise Myoview in September 2012 demonstrated no diagnostic ST segment abnormalities at maximum workload of 10 METs, diaphragmatic attenuation without score or ischemia, LVEF 56%.  Assessment and Plan:  1. Multivessel CAD status post CABG in 2000. Stress testing from 2012 was low risk. Recommend a follow-up surveillance stress test within the next year, he would like to defer for now. No changes made in medical regimen.  2. Hyperlipidemia, tolerating Crestor 20 mg daily. Requesting recent lipid panel from Dr. Nevada Crane.  3. Essential hypertension, blood pressure is very well controlled today.  4. Elevated PSA, prior history of prostatectomy. He is establishing with a new urologist for evaluation in the next few weeks.  Current medicines were reviewed with the patient today.   Orders Placed This Encounter  Procedures  . EKG 12-Lead    Disposition: FU with me in 1  year.   Signed, Satira Sark, MD, [facility] 01/11/2016 2:18 PM    [facility], [place], [place] [address] Phone: [phone]; Fax: [phone]

## 2016-01-11 NOTE — Patient Instructions (Signed)
Your physician wants you to follow-up in: 1 year You will receive a reminder letter in the mail two months in advance. If you don't receive a letter, please call our office to schedule the follow-up appointment.     Take Crestor 20 mg tablet at dinner daily   If you need a refill on your cardiac medications before your next appointment, please call your pharmacy.     Thank you for choosing [facility] !

## 2016-01-18 DIAGNOSIS — C44329 Squamous cell carcinoma of skin of other parts of face: Secondary | ICD-10-CM | POA: Diagnosis not present

## 2016-01-21 ENCOUNTER — Ambulatory Visit (INDEPENDENT_AMBULATORY_CARE_PROVIDER_SITE_OTHER): Payer: Medicare Other | Admitting: Otolaryngology

## 2016-01-21 DIAGNOSIS — H903 Sensorineural hearing loss, bilateral: Secondary | ICD-10-CM | POA: Diagnosis not present

## 2016-01-21 DIAGNOSIS — H6123 Impacted cerumen, bilateral: Secondary | ICD-10-CM | POA: Diagnosis not present

## 2016-02-08 ENCOUNTER — Other Ambulatory Visit: Payer: Self-pay | Admitting: Adult Health

## 2016-02-15 DIAGNOSIS — Z85828 Personal history of other malignant neoplasm of skin: Secondary | ICD-10-CM | POA: Diagnosis not present

## 2016-02-15 DIAGNOSIS — Z08 Encounter for follow-up examination after completed treatment for malignant neoplasm: Secondary | ICD-10-CM | POA: Diagnosis not present

## 2016-02-15 DIAGNOSIS — S50862A Insect bite (nonvenomous) of left forearm, initial encounter: Secondary | ICD-10-CM | POA: Diagnosis not present

## 2016-02-15 DIAGNOSIS — X32XXXD Exposure to sunlight, subsequent encounter: Secondary | ICD-10-CM | POA: Diagnosis not present

## 2016-02-15 DIAGNOSIS — L820 Inflamed seborrheic keratosis: Secondary | ICD-10-CM | POA: Diagnosis not present

## 2016-02-15 DIAGNOSIS — L570 Actinic keratosis: Secondary | ICD-10-CM | POA: Diagnosis not present

## 2016-02-17 ENCOUNTER — Other Ambulatory Visit: Payer: Self-pay | Admitting: Urology

## 2016-02-17 ENCOUNTER — Ambulatory Visit (INDEPENDENT_AMBULATORY_CARE_PROVIDER_SITE_OTHER): Payer: Medicare Other | Admitting: Urology

## 2016-02-17 DIAGNOSIS — C61 Malignant neoplasm of prostate: Secondary | ICD-10-CM | POA: Diagnosis not present

## 2016-02-18 DIAGNOSIS — H524 Presbyopia: Secondary | ICD-10-CM | POA: Diagnosis not present

## 2016-02-18 DIAGNOSIS — H401122 Primary open-angle glaucoma, left eye, moderate stage: Secondary | ICD-10-CM | POA: Diagnosis not present

## 2016-02-18 DIAGNOSIS — H5203 Hypermetropia, bilateral: Secondary | ICD-10-CM | POA: Diagnosis not present

## 2016-02-18 DIAGNOSIS — H52223 Regular astigmatism, bilateral: Secondary | ICD-10-CM | POA: Diagnosis not present

## 2016-03-08 ENCOUNTER — Other Ambulatory Visit: Payer: Self-pay | Admitting: Cardiology

## 2016-03-08 ENCOUNTER — Ambulatory Visit (HOSPITAL_COMMUNITY)
Admission: RE | Admit: 2016-03-08 | Discharge: 2016-03-08 | Disposition: A | Discharge status: Home / Self Care | Payer: Medicare Other | Source: Ambulatory Visit | Attending: Urology | Admitting: Urology

## 2016-03-08 DIAGNOSIS — C7951 Secondary malignant neoplasm of bone: Secondary | ICD-10-CM | POA: Insufficient documentation

## 2016-03-08 DIAGNOSIS — K8020 Calculus of gallbladder without cholecystitis without obstruction: Secondary | ICD-10-CM | POA: Diagnosis not present

## 2016-03-08 DIAGNOSIS — C61 Malignant neoplasm of prostate: Secondary | ICD-10-CM | POA: Insufficient documentation

## 2016-03-08 DIAGNOSIS — I700 Atherosclerosis of aorta: Secondary | ICD-10-CM | POA: Diagnosis not present

## 2016-03-08 LAB — POCT I-STAT CREATININE: Creatinine, Ser: 1.1 mg/dL (ref 0.61–1.24)

## 2016-03-08 MED ORDER — IOPAMIDOL (ISOVUE-300) INJECTION 61%
100.0000 mL | Freq: Once | INTRAVENOUS | Status: AC | PRN
  Administered 2016-03-08: 100 mL via INTRAVENOUS

## 2016-03-16 ENCOUNTER — Encounter (HOSPITAL_COMMUNITY): Payer: Self-pay

## 2016-03-16 ENCOUNTER — Encounter (HOSPITAL_COMMUNITY)
Admission: RE | Admit: 2016-03-16 | Discharge: 2016-03-16 | Disposition: A | Discharge status: Home / Self Care | Payer: Medicare Other | Source: Ambulatory Visit | Attending: Urology | Admitting: Urology

## 2016-03-16 DIAGNOSIS — R937 Abnormal findings on diagnostic imaging of other parts of musculoskeletal system: Secondary | ICD-10-CM | POA: Diagnosis not present

## 2016-03-16 DIAGNOSIS — C61 Malignant neoplasm of prostate: Secondary | ICD-10-CM | POA: Diagnosis not present

## 2016-03-16 MED ORDER — TECHNETIUM TC 99M MEDRONATE IV KIT
25.0000 | PACK | Freq: Once | INTRAVENOUS | Status: AC | PRN
  Administered 2016-03-16: 25 via INTRAVENOUS

## 2016-03-16 MED ORDER — SODIUM CHLORIDE 0.9% FLUSH
INTRAVENOUS | Status: AC
  Filled 2016-03-16: qty 120

## 2016-03-23 ENCOUNTER — Ambulatory Visit (INDEPENDENT_AMBULATORY_CARE_PROVIDER_SITE_OTHER): Payer: Medicare Other | Admitting: Urology

## 2016-03-23 DIAGNOSIS — C61 Malignant neoplasm of prostate: Secondary | ICD-10-CM | POA: Diagnosis not present

## 2016-03-31 ENCOUNTER — Other Ambulatory Visit (HOSPITAL_COMMUNITY): Payer: Self-pay | Admitting: Internal Medicine

## 2016-03-31 ENCOUNTER — Other Ambulatory Visit: Payer: Self-pay

## 2016-03-31 DIAGNOSIS — R911 Solitary pulmonary nodule: Secondary | ICD-10-CM

## 2016-04-08 ENCOUNTER — Ambulatory Visit (HOSPITAL_COMMUNITY)
Admission: RE | Admit: 2016-04-08 | Discharge: 2016-04-08 | Disposition: A | Discharge status: Home / Self Care | Payer: Medicare Other | Source: Ambulatory Visit | Attending: Internal Medicine | Admitting: Internal Medicine

## 2016-04-08 DIAGNOSIS — R911 Solitary pulmonary nodule: Secondary | ICD-10-CM | POA: Diagnosis not present

## 2016-04-08 DIAGNOSIS — R918 Other nonspecific abnormal finding of lung field: Secondary | ICD-10-CM | POA: Diagnosis not present

## 2016-04-08 DIAGNOSIS — I700 Atherosclerosis of aorta: Secondary | ICD-10-CM | POA: Insufficient documentation

## 2016-04-22 DIAGNOSIS — C61 Malignant neoplasm of prostate: Secondary | ICD-10-CM | POA: Diagnosis not present

## 2016-04-27 ENCOUNTER — Ambulatory Visit (INDEPENDENT_AMBULATORY_CARE_PROVIDER_SITE_OTHER): Payer: Medicare Other | Admitting: Urology

## 2016-04-27 DIAGNOSIS — C61 Malignant neoplasm of prostate: Secondary | ICD-10-CM

## 2016-05-19 ENCOUNTER — Ambulatory Visit (INDEPENDENT_AMBULATORY_CARE_PROVIDER_SITE_OTHER): Payer: Medicare Other

## 2016-05-19 ENCOUNTER — Ambulatory Visit (INDEPENDENT_AMBULATORY_CARE_PROVIDER_SITE_OTHER): Payer: Medicare Other | Admitting: Orthopaedic Surgery

## 2016-05-19 ENCOUNTER — Encounter: Payer: Self-pay | Admitting: Orthopaedic Surgery

## 2016-05-19 VITALS — BP 104/77 | HR 100 | Temp 97.5°F | Ht 66.0 in | Wt 155.0 lb

## 2016-05-19 DIAGNOSIS — Z8546 Personal history of malignant neoplasm of prostate: Secondary | ICD-10-CM

## 2016-05-19 DIAGNOSIS — G8929 Other chronic pain: Secondary | ICD-10-CM

## 2016-05-19 DIAGNOSIS — I2510 Atherosclerotic heart disease of native coronary artery without angina pectoris: Secondary | ICD-10-CM | POA: Diagnosis not present

## 2016-05-19 DIAGNOSIS — M25512 Pain in left shoulder: Secondary | ICD-10-CM | POA: Diagnosis not present

## 2016-05-19 DIAGNOSIS — M5441 Lumbago with sciatica, right side: Secondary | ICD-10-CM

## 2016-05-19 MED ORDER — HYDROCODONE-ACETAMINOPHEN 5-325 MG PO TABS: 1.0000 | ORAL_TABLET | ORAL | 0 refills | Status: DC | PRN

## 2016-05-19 MED ORDER — PREDNISONE 5 MG (21) PO TBPK: ORAL_TABLET | ORAL | 0 refills | Status: DC

## 2016-05-19 NOTE — Progress Notes (Signed)
Patient Samuel Gregory Samuel Gregory, male DOB:May 15, 1941, 75 y.o. [id number]  Chief Complaint  Patient presents with  . Follow-up    right hip pain    HPI  Samuel Gregory is a 75 y.o. male who has history of lower back pain with right sided sciatica.  He has not fallen or had any trauma.  The pain has gotten worse and worse over the last month.  He is very uncomfortable.  He has pain going to the right lateral foot and leg.  He has history of prostate cancer that is being treated with hormones now.  He had MRI about seven years ago of the lower back.  He also has chronic pain of the left shoulder.  He has no trauma,no swelling, no paresthesias.  He has pain with overhead use.  I have treated him for this in the distant past.  He did well with injection and is requesting that today. HPI  Body mass index is 25.02 kg/m.  ROS  Review of Systems  HENT: Negative for congestion.   Respiratory: Negative for cough and shortness of breath.   Cardiovascular: Negative for chest pain and leg swelling.  Endocrine: Negative for cold intolerance.  Musculoskeletal: Positive for arthralgias, back pain and joint swelling.  Allergic/Immunologic: Negative for environmental allergies.    Past Medical History:  Diagnosis Date  . Arthritis   . Cancer ([place])   . Coronary atherosclerosis of native coronary artery    Multivessel status post CABG 2000  . Essential hypertension, benign   . Hyperlipidemia     Past Surgical History:  Procedure Laterality Date  . APPENDECTOMY     8 yrs ago- Dr Geroge Baseman  . CATARACT EXTRACTION W/PHACO Left 07/14/2014   Procedure: CATARACT EXTRACTION PHACO AND INTRAOCULAR LENS PLACEMENT (IOC);  Surgeon: Tonny Branch, MD;  Location: [facility];  Service: Ophthalmology;  Laterality: Left;  CDE:6.20  . CATARACT EXTRACTION W/PHACO Right 08/11/2014   Procedure: CATARACT EXTRACTION PHACO AND INTRAOCULAR LENS PLACEMENT RIGHT EYE;  Surgeon: Tonny Branch, MD;  Location: [facility];  Service: Ophthalmology;   Laterality: Right;  CDE:10.11  . COLONOSCOPY N/A 01/14/2015   Procedure: COLONOSCOPY;  Surgeon: Rogene Houston, MD;  [facility];  Service: Endoscopy;  Laterality: N/A;  730  . CORONARY ARTERY BYPASS GRAFT  01/1999   LIMA to LAD, SVG to diagonal, sequential SVG to PDA and PLB  . PROST[place]CTOMY  2004   [name]  . TONSILLECTOMY     age 56    Family History  Problem Relation Age of Onset  . Heart attack Mother   . Cancer Mother   . Aneurysm Father     Social History Social History  Substance Use Topics  . Smoking status: Never Smoker  . Smokeless tobacco: Never Used  . Alcohol use No     Comment: daily-glass of wine    Allergies  Allergen Reactions  . Latex Rash  . Sulfa Antibiotics Rash    Current Outpatient Prescriptions  Medication Sig Dispense Refill  . aspirin 325 MG tablet Take 325 mg by mouth daily.    . Calcium-Magnesium-Vitamin D (CALCIUM 1200+D3 PO) Take by mouth.    . Flaxseed, Linseed, (FLAX SEEDS PO) Take by mouth.    . metoprolol succinate (TOPROL-XL) 25 MG 24 hr tablet TAKE (1) TABLET BY MOUTH DAILY OR AS OTHERWISE DIRECTED. 30 tablet 11  . Multiple Vitamin (MULTIVITAMIN WITH MINERALS) TABS Take 1 tablet by mouth daily.    . ramipril (ALTACE) 5 MG  capsule TAKE (1) CAPSULE BY MOUTH ONCE DAILY. 30 capsule 6  . rosuvastatin (CRESTOR) 20 MG tablet Take 1 tablet (20 mg total) by mouth daily. 90 tablet 3  . HYDROcodone-acetaminophen (NORCO/VICODIN) 5-325 MG tablet Take 1 tablet by mouth every 4 (four) hours as needed for moderate pain (Must last 14 days.Do not take and drive a car or use machinery.). 56 tablet 0  . predniSONE (STERAPRED UNI-PAK 21 TAB) 5 MG (21) TBPK tablet Take 6 pills first day; 5 pills second day; 4 pills third day; 3 pills fourth day; 2 pills next day and 1 pill last day. 21 tablet 0   No current facility-administered medications for this visit.      Physical Exam  Blood pressure 104/77, pulse 100, temperature 97.5 F  (36.4 C), height 5\' 6"  (1.676 m), weight 155 lb (70.3 kg).  Constitutional: overall normal hygiene, normal nutrition, well developed, normal grooming, normal body habitus. Assistive device:none  Musculoskeletal: gait and station Limp none, muscle tone and strength are normal, no tremors or atrophy is present.  .  Neurological: coordination overall normal.  Deep tendon reflex/nerve stretch intact.  Sensation normal.  Cranial nerves II-XII intact.   Skin:   Normal overall no scars, lesions, ulcers or rashes. No psoriasis.  Psychiatric: Alert and oriented x 3.  Recent memory intact, remote memory unclear.  Normal mood and affect. Well groomed.  Good eye contact.  Cardiovascular: overall no swelling, no varicosities, no edema bilaterally, normal temperatures of the legs and arms, no clubbing, cyanosis and good capillary refill.  Lymphatic: palpation is normal.  Spine/Pelvis examination:  Inspection:  Overall, sacoiliac joint benign and hips nontender; without crepitus or defects.   Thoracic spine inspection: Alignment normal without kyphosis present   Lumbar spine inspection:  Alignment  with normal lumbar lordosis, without scoliosis apparent.   Thoracic spine palpation:  without tenderness of spinal processes   Lumbar spine palpation: with tenderness of lumbar area; without tightness of lumbar muscles    Range of Motion:   Lumbar flexion, forward flexion is 35 with pain or tenderness    Lumbar extension is 5 with pain or tenderness   Left lateral bend is Normal  without pain or tenderness   Right lateral bend is Normal without pain or tenderness   Straight leg raising is Abnormal- at 25 degrees right   Strength & tone: Normal   Stability overall normal stability   Examination of left Upper Extremity is done.  Inspection:   Overall:  Elbow non-tender without crepitus or defects, forearm non-tender without crepitus or defects, wrist non-tender without crepitus or defects, hand  non-tender.    Shoulder: with glenohumeral joint tenderness, without effusion.   Upper arm: with swelling and tenderness   Range of motion:   Overall:  Full range of motion of the elbow, full range of motion of wrist and full range of motion in fingers.   Shoulder:  left  165 degrees forward flexion; 145 degrees abduction; 30 degrees internal rotation, 30 degrees external rotation, 15 degrees extension, 40 degrees adduction.   Stability:   Overall:  Shoulder, elbow and wrist stable   Strength and Tone:   Overall full shoulder muscles strength, full upper arm strength and normal upper arm bulk and tone.  The patient has been educated about the nature of the problem(s) and counseled on treatment options.  The patient appeared to understand what I have discussed and is in agreement with it.  Encounter Diagnoses  Name Primary?  Marland Kitchen  Chronic right-sided low back pain with right-sided sciatica Yes  . Chronic left shoulder pain   . Personal history of prostate cancer    PROCEDURE NOTE:  The patient request injection, verbal consent was obtained.  The left shoulder was prepped appropriately after time out was performed.   Sterile technique was observed and injection of 1 cc of Depo-Medrol 40 mg with several cc's of plain xylocaine. Anesthesia was provided by ethyl chloride and a 20-gauge needle was used to inject the shoulder area. A posterior approach was used.  The injection was tolerated well.  A band aid dressing was applied.  The patient was advised to apply ice later today and tomorrow to the injection sight as needed.   PLAN Call if any problems.  Precautions discussed.  Continue current medications.   Return to clinic after MRI of the lumbar spine   Electronically Signed Sanjuana Kava, MD 10/19/20173:48 PM

## 2016-05-20 DIAGNOSIS — M47816 Spondylosis without myelopathy or radiculopathy, lumbar region: Secondary | ICD-10-CM | POA: Diagnosis not present

## 2016-05-20 DIAGNOSIS — M47817 Spondylosis without myelopathy or radiculopathy, lumbosacral region: Secondary | ICD-10-CM | POA: Diagnosis not present

## 2016-05-20 DIAGNOSIS — M5126 Other intervertebral disc displacement, lumbar region: Secondary | ICD-10-CM | POA: Diagnosis not present

## 2016-05-20 DIAGNOSIS — M5127 Other intervertebral disc displacement, lumbosacral region: Secondary | ICD-10-CM | POA: Diagnosis not present

## 2016-05-26 ENCOUNTER — Encounter: Payer: Self-pay | Admitting: Orthopaedic Surgery

## 2016-05-26 ENCOUNTER — Ambulatory Visit (INDEPENDENT_AMBULATORY_CARE_PROVIDER_SITE_OTHER): Payer: Medicare Other | Admitting: Orthopaedic Surgery

## 2016-05-26 VITALS — BP 122/72 | HR 68 | Temp 97.0°F | Ht 66.0 in | Wt 154.8 lb

## 2016-05-26 DIAGNOSIS — I2510 Atherosclerotic heart disease of native coronary artery without angina pectoris: Secondary | ICD-10-CM

## 2016-05-26 DIAGNOSIS — M5441 Lumbago with sciatica, right side: Secondary | ICD-10-CM

## 2016-05-26 DIAGNOSIS — Z8546 Personal history of malignant neoplasm of prostate: Secondary | ICD-10-CM | POA: Diagnosis not present

## 2016-05-26 DIAGNOSIS — G8929 Other chronic pain: Secondary | ICD-10-CM | POA: Diagnosis not present

## 2016-05-26 DIAGNOSIS — C61 Malignant neoplasm of prostate: Secondary | ICD-10-CM | POA: Diagnosis not present

## 2016-05-26 DIAGNOSIS — M25512 Pain in left shoulder: Secondary | ICD-10-CM

## 2016-05-26 NOTE — Progress Notes (Signed)
Patient Samuel Gregory, male DOB:1941/04/24, 75 y.o. [id number]  Chief Complaint  Patient presents with  . Follow-up    MRI results     HPI  Samuel Gregory is a 75 y.o. male who has right sided sciatica and also left shoulder pain.  I injected the left shoulder last time and he is doing very well with that.  He has no pain there today.    He had MRI of the lumbar spine.  He has history of prostate cancer.  He had had bone scan recently showing metastases to the lumbar spine.  The MRI shows this in multiple ares of the vertebral bodies and well as iliac crest.  He has foraminal disc protrusion on right at L4-L5 associate with spinal stenosis moderate and moderate bilateral foraminal stenosis.  He has moderally severe foraminal stenosis on the right at L5-S1.  I think he is a candidate for epidural injection.  His daughter works at [employer] and he wants her to have their medical friend review the above MRI and get him seen at [facility].  I told him if I need to make the referral "official",  the office will do so. HPI  Body mass index is 24.99 kg/m.  ROS  Review of Systems  HENT: Negative for congestion.   Respiratory: Negative for cough and shortness of breath.   Cardiovascular: Negative for chest pain and leg swelling.  Endocrine: Negative for cold intolerance.  Musculoskeletal: Positive for arthralgias, back pain and joint swelling.  Allergic/Immunologic: Negative for environmental allergies.    Past Medical History:  Diagnosis Date  . Arthritis   . Cancer ([place])   . Coronary atherosclerosis of native coronary artery    Multivessel status post CABG 2000  . Essential hypertension, benign   . Hyperlipidemia     Past Surgical History:  Procedure Laterality Date  . APPENDECTOMY     8 yrs ago- Dr Geroge Baseman  . CATARACT EXTRACTION W/PHACO Left 07/14/2014   Procedure: CATARACT EXTRACTION PHACO AND INTRAOCULAR LENS PLACEMENT (IOC);  Surgeon: Tonny Branch, MD;  Location: [facility];   Service: Ophthalmology;  Laterality: Left;  CDE:6.20  . CATARACT EXTRACTION W/PHACO Right 08/11/2014   Procedure: CATARACT EXTRACTION PHACO AND INTRAOCULAR LENS PLACEMENT RIGHT EYE;  Surgeon: Tonny Branch, MD;  Location: [facility];  Service: Ophthalmology;  Laterality: Right;  CDE:10.11  . COLONOSCOPY N/A 01/14/2015   Procedure: COLONOSCOPY;  Surgeon: Rogene Houston, MD;  [facility];  Service: Endoscopy;  Laterality: N/A;  730  . CORONARY ARTERY BYPASS GRAFT  01/1999   LIMA to LAD, SVG to diagonal, sequential SVG to PDA and PLB  . PROST[place]CTOMY  2004   [name]  . TONSILLECTOMY     age 80    Family History  Problem Relation Age of Onset  . Heart attack Mother   . Cancer Mother   . Aneurysm Father     Social History Social History  Substance Use Topics  . Smoking status: Never Smoker  . Smokeless tobacco: Never Used  . Alcohol use No     Comment: daily-glass of wine    Allergies  Allergen Reactions  . Latex Rash  . Sulfa Antibiotics Rash    Current Outpatient Prescriptions  Medication Sig Dispense Refill  . aspirin 325 MG tablet Take 325 mg by mouth daily.    . Calcium-Magnesium-Vitamin D (CALCIUM 1200+D3 PO) Take by mouth.    . Flaxseed, Linseed, (FLAX SEEDS PO) Take by mouth.    Marland Kitchen  HYDROcodone-acetaminophen (NORCO/VICODIN) 5-325 MG tablet Take 1 tablet by mouth every 4 (four) hours as needed for moderate pain (Must last 14 days.Do not take and drive a car or use machinery.). 56 tablet 0  . metoprolol succinate (TOPROL-XL) 25 MG 24 hr tablet TAKE (1) TABLET BY MOUTH DAILY OR AS OTHERWISE DIRECTED. 30 tablet 11  . Multiple Vitamin (MULTIVITAMIN WITH MINERALS) TABS Take 1 tablet by mouth daily.    . predniSONE (STERAPRED UNI-PAK 21 TAB) 5 MG (21) TBPK tablet Take 6 pills first day; 5 pills second day; 4 pills third day; 3 pills fourth day; 2 pills next day and 1 pill last day. 21 tablet 0  . ramipril (ALTACE) 5 MG capsule TAKE (1) CAPSULE BY MOUTH ONCE DAILY.  30 capsule 6  . rosuvastatin (CRESTOR) 20 MG tablet Take 1 tablet (20 mg total) by mouth daily. 90 tablet 3   No current facility-administered medications for this visit.      Physical Exam  Blood pressure 122/72, pulse 68, temperature 97 F (36.1 C), height 5\' 6"  (1.676 m), weight 154 lb 12.8 oz (70.2 kg).  Constitutional: overall normal hygiene, normal nutrition, well developed, normal grooming, normal body habitus. Assistive device:none  Musculoskeletal: gait and station Limp none, muscle tone and strength are normal, no tremors or atrophy is present.  .  Neurological: coordination overall normal.  Deep tendon reflex/nerve stretch intact.  Sensation normal.  Cranial nerves II-XII intact.   Skin:   Normal overall no scars, lesions, ulcers or rashes. No psoriasis.  Psychiatric: Alert and oriented x 3.  Recent memory intact, remote memory unclear.  Normal mood and affect. Well groomed.  Good eye contact.  Cardiovascular: overall no swelling, no varicosities, no edema bilaterally, normal temperatures of the legs and arms, no clubbing, cyanosis and good capillary refill.  Lymphatic: palpation is normal.  Examination of left Upper Extremity is done.  Inspection:   Overall:  Elbow non-tender without crepitus or defects, forearm non-tender without crepitus or defects, wrist non-tender without crepitus or defects, hand non-tender.    Shoulder: without glenohumeral joint tenderness, without effusion.   Upper arm: without swelling and tenderness   Range of motion:   Overall:  Full range of motion of the elbow, full range of motion of wrist and full range of motion in fingers.   Shoulder:  left  full degrees forward flexion; full degrees abduction; full degrees internal rotation, full degrees external rotation, full degrees extension, full degrees adduction.   Stability:   Overall:  Shoulder, elbow and wrist stable   Strength and Tone:   Overall full shoulder muscles strength, full  upper arm strength and normal upper arm bulk and tone.  Spine/Pelvis examination:  Inspection:  Overall, sacoiliac joint benign and hips nontender; without crepitus or defects.   Thoracic spine inspection: Alignment normal without kyphosis present   Lumbar spine inspection:  Alignment  with normal lumbar lordosis, without scoliosis apparent.   Thoracic spine palpation:  without tenderness of spinal processes   Lumbar spine palpation: with tenderness of lumbar area; without tightness of lumbar muscles    Range of Motion:   Lumbar flexion, forward flexion is 45 without pain or tenderness    Lumbar extension is full without pain or tenderness   Left lateral bend is Normal  without pain or tenderness   Right lateral bend is Normal without pain or tenderness   Straight leg raising is Normal   Strength & tone: Normal   Stability overall normal stability  The patient has been educated about the nature of the problem(s) and counseled on treatment options.  The patient appeared to understand what I have discussed and is in agreement with it.  Encounter Diagnoses  Name Primary?  . Chronic right-sided low back pain with right-sided sciatica Yes  . Prostate cancer, primary, with metastasis from prostate to other site [facility])   . Chronic left shoulder pain   . Personal history of prostate cancer     PLAN Call if any problems.  Precautions discussed.  Continue current medications.   Return to clinic PRN.  To go to [facility], self referral.   Electronically Signed Sanjuana Kava, MD 10/26/20173:22 PM

## 2016-05-26 NOTE — Patient Instructions (Signed)
The patient's daughter works at [employer] and is in the process of getting him in to see a doctor there.  If they need a formal referral, the patient will call and let us know.  He was given the CD of the MRI as well as the report to take with him.

## 2016-05-27 ENCOUNTER — Ambulatory Visit (INDEPENDENT_AMBULATORY_CARE_PROVIDER_SITE_OTHER): Payer: Medicare Other | Admitting: Urology

## 2016-05-27 DIAGNOSIS — C61 Malignant neoplasm of prostate: Secondary | ICD-10-CM

## 2016-06-21 DIAGNOSIS — C61 Malignant neoplasm of prostate: Secondary | ICD-10-CM | POA: Diagnosis not present

## 2016-06-29 ENCOUNTER — Ambulatory Visit (INDEPENDENT_AMBULATORY_CARE_PROVIDER_SITE_OTHER): Payer: Medicare Other | Admitting: Urology

## 2016-06-29 DIAGNOSIS — C61 Malignant neoplasm of prostate: Secondary | ICD-10-CM

## 2016-06-30 DIAGNOSIS — E291 Testicular hypofunction: Secondary | ICD-10-CM | POA: Diagnosis not present

## 2016-06-30 DIAGNOSIS — C61 Malignant neoplasm of prostate: Secondary | ICD-10-CM | POA: Diagnosis not present

## 2016-06-30 DIAGNOSIS — C7951 Secondary malignant neoplasm of bone: Secondary | ICD-10-CM | POA: Diagnosis not present

## 2016-07-04 DIAGNOSIS — Z23 Encounter for immunization: Secondary | ICD-10-CM | POA: Diagnosis not present

## 2016-08-11 DIAGNOSIS — E291 Testicular hypofunction: Secondary | ICD-10-CM | POA: Diagnosis not present

## 2016-08-11 DIAGNOSIS — C61 Malignant neoplasm of prostate: Secondary | ICD-10-CM | POA: Diagnosis not present

## 2016-08-11 DIAGNOSIS — Z79899 Other long term (current) drug therapy: Secondary | ICD-10-CM | POA: Diagnosis not present

## 2016-08-11 DIAGNOSIS — C7951 Secondary malignant neoplasm of bone: Secondary | ICD-10-CM | POA: Diagnosis not present

## 2016-09-22 DIAGNOSIS — E291 Testicular hypofunction: Secondary | ICD-10-CM | POA: Diagnosis not present

## 2016-09-22 DIAGNOSIS — C7951 Secondary malignant neoplasm of bone: Secondary | ICD-10-CM | POA: Diagnosis not present

## 2016-09-22 DIAGNOSIS — C61 Malignant neoplasm of prostate: Secondary | ICD-10-CM | POA: Diagnosis not present

## 2016-09-22 DIAGNOSIS — Z79899 Other long term (current) drug therapy: Secondary | ICD-10-CM | POA: Diagnosis not present

## 2016-10-03 ENCOUNTER — Other Ambulatory Visit: Payer: Self-pay | Admitting: Cardiology

## 2016-10-06 ENCOUNTER — Emergency Department (HOSPITAL_COMMUNITY)
Admission: EM | Admit: 2016-10-06 | Discharge: 2016-10-06 | Disposition: A | Discharge status: Home / Self Care | Payer: Medicare Other | Attending: Emergency Medicine | Admitting: Emergency Medicine

## 2016-10-06 ENCOUNTER — Emergency Department (HOSPITAL_COMMUNITY): Payer: Medicare Other

## 2016-10-06 ENCOUNTER — Encounter (HOSPITAL_COMMUNITY): Payer: Self-pay | Admitting: *Deleted

## 2016-10-06 DIAGNOSIS — I10 Essential (primary) hypertension: Secondary | ICD-10-CM | POA: Diagnosis not present

## 2016-10-06 DIAGNOSIS — R42 Dizziness and giddiness: Secondary | ICD-10-CM | POA: Diagnosis present

## 2016-10-06 DIAGNOSIS — E860 Dehydration: Secondary | ICD-10-CM | POA: Diagnosis not present

## 2016-10-06 DIAGNOSIS — R55 Syncope and collapse: Secondary | ICD-10-CM | POA: Diagnosis not present

## 2016-10-06 DIAGNOSIS — J111 Influenza due to unidentified influenza virus with other respiratory manifestations: Secondary | ICD-10-CM | POA: Diagnosis not present

## 2016-10-06 DIAGNOSIS — R404 Transient alteration of awareness: Secondary | ICD-10-CM | POA: Diagnosis not present

## 2016-10-06 DIAGNOSIS — Z79899 Other long term (current) drug therapy: Secondary | ICD-10-CM | POA: Diagnosis not present

## 2016-10-06 DIAGNOSIS — R05 Cough: Secondary | ICD-10-CM | POA: Diagnosis not present

## 2016-10-06 DIAGNOSIS — Z951 Presence of aortocoronary bypass graft: Secondary | ICD-10-CM | POA: Insufficient documentation

## 2016-10-06 DIAGNOSIS — J101 Influenza due to other identified influenza virus with other respiratory manifestations: Secondary | ICD-10-CM | POA: Insufficient documentation

## 2016-10-06 DIAGNOSIS — I2510 Atherosclerotic heart disease of native coronary artery without angina pectoris: Secondary | ICD-10-CM | POA: Insufficient documentation

## 2016-10-06 DIAGNOSIS — Z7982 Long term (current) use of aspirin: Secondary | ICD-10-CM | POA: Diagnosis not present

## 2016-10-06 DIAGNOSIS — Z8546 Personal history of malignant neoplasm of prostate: Secondary | ICD-10-CM | POA: Insufficient documentation

## 2016-10-06 LAB — CBC
HCT: 43.1 % (ref 39.0–52.0)
Hemoglobin: 14.5 g/dL (ref 13.0–17.0)
MCH: 31.8 pg (ref 26.0–34.0)
MCHC: 33.6 g/dL (ref 30.0–36.0)
MCV: 94.5 fL (ref 78.0–100.0)
PLATELETS: 168 10*3/uL (ref 150–400)
RBC: 4.56 MIL/uL (ref 4.22–5.81)
RDW: 13.6 % (ref 11.5–15.5)
WBC: 5.2 10*3/uL (ref 4.0–10.5)

## 2016-10-06 LAB — BASIC METABOLIC PANEL
Anion gap: 8 (ref 5–15)
BUN: 26 mg/dL — ABNORMAL HIGH (ref 6–20)
CALCIUM: 9.2 mg/dL (ref 8.9–10.3)
CO2: 30 mmol/L (ref 22–32)
Chloride: 97 mmol/L — ABNORMAL LOW (ref 101–111)
Creatinine, Ser: 1.34 mg/dL — ABNORMAL HIGH (ref 0.61–1.24)
GFR, EST AFRICAN AMERICAN: 58 mL/min — AB (ref >60)
GFR, EST NON AFRICAN AMERICAN: 50 mL/min — AB (ref >60)
Glucose, Bld: 102 mg/dL — ABNORMAL HIGH (ref 65–99)
Potassium: 3.5 mmol/L (ref 3.5–5.1)
Sodium: 135 mmol/L (ref 135–145)

## 2016-10-06 LAB — CBG MONITORING, ED: GLUCOSE-CAPILLARY: 103 mg/dL — AB (ref 65–99)

## 2016-10-06 MED ORDER — ACETAMINOPHEN 325 MG PO TABS
650.0000 mg | ORAL_TABLET | Freq: Once | ORAL | Status: AC
  Administered 2016-10-06: 650 mg via ORAL
  Filled 2016-10-06: qty 2

## 2016-10-06 MED ORDER — OSELTAMIVIR PHOSPHATE 75 MG PO CAPS: 75.0000 mg | ORAL_CAPSULE | Freq: Two times a day (BID) | ORAL | 0 refills | Status: DC

## 2016-10-06 MED ORDER — ONDANSETRON HCL 4 MG/2ML IJ SOLN
4.0000 mg | Freq: Once | INTRAMUSCULAR | Status: AC
  Administered 2016-10-06: 4 mg via INTRAVENOUS
  Filled 2016-10-06: qty 2

## 2016-10-06 MED ORDER — SODIUM CHLORIDE 0.9 % IV BOLUS (SEPSIS)
1000.0000 mL | Freq: Once | INTRAVENOUS | Status: AC
  Administered 2016-10-06: 1000 mL via INTRAVENOUS

## 2016-10-06 MED ORDER — OSELTAMIVIR PHOSPHATE 75 MG PO CAPS
75.0000 mg | ORAL_CAPSULE | Freq: Once | ORAL | Status: AC
  Administered 2016-10-06: 75 mg via ORAL
  Filled 2016-10-06: qty 1

## 2016-10-06 MED ORDER — ONDANSETRON 4 MG PO TBDP: 4.0000 mg | ORAL_TABLET | Freq: Three times a day (TID) | ORAL | 1 refills | Status: DC | PRN

## 2016-10-06 MED ORDER — SODIUM CHLORIDE 0.9 % IV SOLN: INTRAVENOUS | Status: DC

## 2016-10-06 NOTE — Discharge Instructions (Signed)
Return for any new or worse symptoms. Take the Tamiflu as directed. Keep yourself hydrated. Take Zofran as needed for any nausea or vomiting. Over-the-counter cold and flu medications of your choice.

## 2016-10-06 NOTE — ED Triage Notes (Signed)
Pt brought in by [facility]. Pt was seen at Urgent Care today for nasal congestion, sore throat, body aches, nausea x 1 week. Pt was diagnosed with flu B at Urgent Care. When pt was in lobby at Urgent Care, staff went to get pt and they report "he wasn't responding, after a few minutes of shaking his arm he woke up". Pt not aware of syncopal episode. Pt reports dizziness/lightheadedness and nausea. Per EMS pt vomited x1 on the ride to ED.

## 2016-10-06 NOTE — ED Provider Notes (Addendum)
[place] DEPT Provider Note   CSN: [id number] Arrival date & time: 10/06/16  1014    By signing my name below, I, [name], attest that this documentation has been prepared under the direction and in the presence of Fredia Sorrow, MD. Electronically Signed: [name], Scribe. 10/06/16. 11:08 AM. History   Chief Complaint Chief Complaint  Patient presents with  . Loss of Consciousness    The history is provided by the patient. No language interpreter was used.  Loss of Consciousness   This is a new problem. The current episode started 1 to 2 hours ago. The problem has been resolved. Associated symptoms include congestion, dizziness, fever, headaches, light-headedness, nausea and vomiting. Pertinent negatives include abdominal pain, chest pain and confusion.  HPI Comments: Samuel Gregory is a 76 y.o. male brought in by ambulance, who presents to the Emergency Department complaining of a syncopal episode that occurred earlier today. Per EMS: Patient had one episode of vomiting while en route to the ED. The patient states that he was seen by urgent care earlier today and was diagnosed with the flu. He states that while he was there he had a syncopal episode. The episode lasted for a few minutes. He reports feeling dizzy, light-headed, and nausea after this episode. He states that he has been feeling bad for the past 3 days and it has been progressively worsening. He reports that his sinus problems began 3 days ago. He reports rhinorrhea which began yesterday. He reports having a fever of Tmax (100.2) earlier today. He denies tunnel vision and chest pain. He denies any hx of syncopal episodes. He is currently on chemo which he takes oral meds for his prostate cancer.  Past Medical History:  Diagnosis Date  . Arthritis   . Cancer ([place])   . Coronary atherosclerosis of native coronary artery    Multivessel status post CABG 2000  . Essential hypertension, benign   .  Hyperlipidemia     Patient Active Problem List   Diagnosis Date Noted  . Essential hypertension 01/23/2013  . Coronary atherosclerosis of native coronary artery 01/23/2013  . Mixed hyperlipidemia 01/23/2013    Past Surgical History:  Procedure Laterality Date  . APPENDECTOMY     8 yrs ago- Dr Geroge Baseman  . CATARACT EXTRACTION W/PHACO Left 07/14/2014   Procedure: CATARACT EXTRACTION PHACO AND INTRAOCULAR LENS PLACEMENT (IOC);  Surgeon: Tonny Branch, MD;  Location: [facility];  Service: Ophthalmology;  Laterality: Left;  CDE:6.20  . CATARACT EXTRACTION W/PHACO Right 08/11/2014   Procedure: CATARACT EXTRACTION PHACO AND INTRAOCULAR LENS PLACEMENT RIGHT EYE;  Surgeon: Tonny Branch, MD;  Location: [facility];  Service: Ophthalmology;  Laterality: Right;  CDE:10.11  . COLONOSCOPY N/A 01/14/2015   Procedure: COLONOSCOPY;  Surgeon: Rogene Houston, MD;  Location: [facility];  Service: Endoscopy;  Laterality: N/A;  730  . CORONARY ARTERY BYPASS GRAFT  01/1999   LIMA to LAD, SVG to diagonal, sequential SVG to PDA and PLB  . PROST[place]CTOMY  2004   [name]  . TONSILLECTOMY     age 44      Home Medications    Prior to Admission medications   Medication Sig Start Date End Date Taking? Authorizing Provider  acetaminophen (TYLENOL) 500 MG tablet Take 500 mg by mouth once.    Yes Historical Provider, MD  aspirin 325 MG tablet Take 325 mg by mouth daily.   Yes Historical Provider, MD  Calcium-Magnesium-Vitamin D (CALCIUM 1200+D3 PO) Take 1 tablet by mouth daily.  Yes Historical Provider, MD  fluticasone (FLONASE) 50 MCG/ACT nasal spray Place 1 spray into both nostrils daily as needed for allergies or rhinitis.   Yes Historical Provider, MD  metoprolol succinate (TOPROL-XL) 25 MG 24 hr tablet TAKE (1) TABLET BY MOUTH DAILY OR AS OTHERWISE DIRECTED. 02/08/16  Yes Satira Sark, MD  Multiple Vitamin (MULTIVITAMIN WITH MINERALS) TABS Take 1 tablet by mouth daily.   Yes Historical Provider, MD    naproxen sodium (ANAPROX) 220 MG tablet Take 220 mg by mouth daily as needed (pain).   Yes Historical Provider, MD  predniSONE (DELTASONE) 5 MG tablet Take 1 tablet by mouth 2 (two) times daily. 09/20/16  Yes Historical Provider, MD  ramipril (ALTACE) 5 MG capsule TAKE (1) CAPSULE BY MOUTH ONCE DAILY. 10/03/16  Yes Satira Sark, MD  rosuvastatin (CRESTOR) 20 MG tablet Take 1 tablet (20 mg total) by mouth daily. 01/11/16  Yes Satira Sark, MD  Thiamine HCl (VITAMIN B-1 PO) Take 1 tablet by mouth daily.   Yes Historical Provider, MD  HYDROcodone-acetaminophen (NORCO/VICODIN) 5-325 MG tablet Take 1 tablet by mouth every 4 (four) hours as needed for moderate pain (Must last 14 days.Do not take and drive a car or use machinery.). Patient not taking: Reported on 10/06/2016 05/19/16   Sanjuana Kava, MD  ondansetron (ZOFRAN ODT) 4 MG disintegrating tablet Take 1 tablet (4 mg total) by mouth every 8 (eight) hours as needed. 10/06/16   Fredia Sorrow, MD  oseltamivir (TAMIFLU) 75 MG capsule Take 1 capsule (75 mg total) by mouth every 12 (twelve) hours. 10/06/16   Fredia Sorrow, MD  ZYTIGA 250 MG tablet Take 4 tablets by mouth daily. 09/20/16   Historical Provider, MD    Family History Family History  Problem Relation Age of Onset  . Heart attack Mother   . Cancer Mother   . Aneurysm Father     Social History Social History  Substance Use Topics  . Smoking status: Never Smoker  . Smokeless tobacco: Never Used  . Alcohol use 0.0 oz/week     Comment: daily-glass of wine     Allergies   Latex and Sulfa antibiotics   Review of Systems Review of Systems  Constitutional: Positive for chills and fever.  HENT: Positive for congestion.   Eyes: Negative for visual disturbance.  Respiratory: Positive for cough. Negative for shortness of breath.   Cardiovascular: Positive for syncope. Negative for chest pain.  Gastrointestinal: Positive for nausea and vomiting. Negative for abdominal pain and  diarrhea.  Genitourinary: Negative for difficulty urinating, dysuria and hematuria.  Musculoskeletal: Positive for myalgias. Negative for joint swelling.  Skin: Negative for rash.  Allergic/Immunologic: Positive for immunocompromised state.  Neurological: Positive for dizziness, syncope, light-headedness and headaches.  Hematological: Does not bruise/bleed easily.  Psychiatric/Behavioral: Negative for confusion.  All other systems reviewed and are negative.    Physical Exam Updated Vital Signs BP 108/57   Pulse 76   Temp 99.3 F (37.4 C) (Oral)   Resp 17   Ht 5\' 6"  (1.676 m)   Wt 70.3 kg   SpO2 94%   BMI 25.02 kg/m   Physical Exam  Constitutional: He is oriented to person, place, and time. He appears well-developed and well-nourished.  HENT:  Head: Normocephalic and atraumatic.  Mouth/Throat: Oropharynx is clear and moist.  Eyes: EOM are normal. Pupils are equal, round, and reactive to light. No scleral icterus.  Neck: Normal range of motion.  Cardiovascular: Normal rate, regular rhythm, normal heart sounds  and intact distal pulses.   Heart is normal  Pulmonary/Chest: Effort normal and breath sounds normal. No respiratory distress.  Lungs are clear bilaterally.  Abdominal: Soft. Bowel sounds are normal. He exhibits no distension. There is no tenderness.  Abdomen is non-tender.  Musculoskeletal: Normal range of motion.  No swelling in ankles.  Neurological: He is alert and oriented to person, place, and time.  Skin: Skin is warm and dry.  Psychiatric: He has a normal mood and affect. Judgment normal.  Nursing note and vitals reviewed.   ED Treatments / Results  DIAGNOSTIC STUDIES: Oxygen Saturation is 95% on RA, adequate by my interpretation.    COORDINATION OF CARE: 10:51 AM Discussed treatment plan with pt at bedside and pt agreed to plan. I will check the patient's CXR and labs.  Labs (all labs ordered are listed, but only abnormal results are displayed) Labs  Reviewed  BASIC METABOLIC PANEL - Abnormal; Notable for the following:       Result Value   Chloride 97 (*)    Glucose, Bld 102 (*)    BUN 26 (*)    Creatinine, Ser 1.34 (*)    GFR calc non Af Amer 50 (*)    GFR calc Af Amer 58 (*)    All other components within normal limits  CBG MONITORING, ED - Abnormal; Notable for the following:    Glucose-Capillary 103 (*)    All other components within normal limits  CBC  URINALYSIS, ROUTINE W REFLEX MICROSCOPIC    EKG  EKG Interpretation None       Radiology Dg Chest Port 1 View  Result Date: 10/06/2016 CLINICAL DATA:  Diagnosed with flu B today, cough, fever, congestion, sore throat EXAM: PORTABLE CHEST 1 VIEW COMPARISON:  08/03/2015 FINDINGS: Cardiomediastinal silhouette is stable. Status post median sternotomy. No infiltrate or pleural effusion. No pulmonary edema. Atherosclerotic calcifications of thoracic aorta. IMPRESSION: No active disease. Electronically Signed   By: Lahoma Crocker M.D.   On: 10/06/2016 11:11    Procedures Procedures (including critical care time)  Medications Ordered in ED Medications  0.9 %  sodium chloride infusion (not administered)  sodium chloride 0.9 % bolus 1,000 mL (not administered)  sodium chloride 0.9 % bolus 1,000 mL (0 mLs Intravenous Stopped 10/06/16 1338)  ondansetron (ZOFRAN) injection 4 mg (4 mg Intravenous Given 10/06/16 1149)  oseltamivir (TAMIFLU) capsule 75 mg (75 mg Oral Given 10/06/16 1149)  acetaminophen (TYLENOL) tablet 650 mg (650 mg Oral Given 10/06/16 1159)     Initial Impression / Assessment and Plan / ED Course  I have reviewed the triage vital signs and the nursing notes.  Pertinent labs & imaging results that were available during my care of the patient were reviewed by me and considered in my medical decision making (see chart for details).    Patient with history consistent with influenza. Was at urgent care did have a flu test that was positive for influenza B. Patient was  sitting in a chair had a syncopal episode. Brought in by EMS patient had one episode of vomiting. Patient's symptoms started a few days ago but really just got worse yesterday. Patient is on oral chemotherapy for prostate cancer so he is immunocompromised is a candidate for starting Tamiflu at any course on the illness but I still suspect that the actual flu symptoms have been within the last couple days and it will be helpful.  Suspect that the syncope was due to dehydration. Blood pressures here were fine. Patient  received 1 L of normal saline feels much better. Blood pressures systolics remain around 833. Will give patient a second liter of normal saline. And if he is able to ambulate will discharge home. First dose of Tamiflu here. We'll continue Tamiflu over the next 5 days. And we'll treat with Zofran. Other symptoms can be treated with over-the-counter meds. Patient's wife present with him. She is aware that if anything newer worse happens to bring him back.  Cardiac monitoring here without any arrhythmias. Labs without any significant abnormalities. Other than some evidence of some slight dehydration which fits his picture clinically.   Final Clinical Impressions(s) / ED Diagnoses   Final diagnoses:  Influenza B  Vasovagal syncope    New Prescriptions New Prescriptions   ONDANSETRON (ZOFRAN ODT) 4 MG DISINTEGRATING TABLET    Take 1 tablet (4 mg total) by mouth every 8 (eight) hours as needed.   OSELTAMIVIR (TAMIFLU) 75 MG CAPSULE    Take 1 capsule (75 mg total) by mouth every 12 (twelve) hours.   I personally performed the services described in this documentation, which was scribed in my presence. The recorded information has been reviewed and is accurate.      Fredia Sorrow, MD 10/06/16 1357  Addendum: Patient is able to stand did not feel lightheaded did not feel like he was going to pass out. Patient received a total of 2 L of normal saline. Patient stable for discharge home  and close observation by his wife.   Fredia Sorrow, MD 10/06/16 [phone]

## 2016-10-19 ENCOUNTER — Other Ambulatory Visit: Payer: Self-pay | Admitting: Adult Health

## 2016-10-19 DIAGNOSIS — L570 Actinic keratosis: Secondary | ICD-10-CM | POA: Diagnosis not present

## 2016-10-19 DIAGNOSIS — C44319 Basal cell carcinoma of skin of other parts of face: Secondary | ICD-10-CM | POA: Diagnosis not present

## 2016-10-19 DIAGNOSIS — D225 Melanocytic nevi of trunk: Secondary | ICD-10-CM | POA: Diagnosis not present

## 2016-10-19 DIAGNOSIS — Z08 Encounter for follow-up examination after completed treatment for malignant neoplasm: Secondary | ICD-10-CM | POA: Diagnosis not present

## 2016-10-19 DIAGNOSIS — Z85828 Personal history of other malignant neoplasm of skin: Secondary | ICD-10-CM | POA: Diagnosis not present

## 2016-10-19 DIAGNOSIS — X32XXXD Exposure to sunlight, subsequent encounter: Secondary | ICD-10-CM | POA: Diagnosis not present

## 2016-10-31 ENCOUNTER — Other Ambulatory Visit: Payer: Self-pay | Admitting: Cardiology

## 2016-11-01 ENCOUNTER — Ambulatory Visit (INDEPENDENT_AMBULATORY_CARE_PROVIDER_SITE_OTHER): Payer: Medicare Other | Admitting: Urology

## 2016-11-01 DIAGNOSIS — R351 Nocturia: Secondary | ICD-10-CM

## 2016-11-01 DIAGNOSIS — C61 Malignant neoplasm of prostate: Secondary | ICD-10-CM

## 2016-11-24 DIAGNOSIS — C7951 Secondary malignant neoplasm of bone: Secondary | ICD-10-CM | POA: Diagnosis not present

## 2016-11-24 DIAGNOSIS — C61 Malignant neoplasm of prostate: Secondary | ICD-10-CM | POA: Diagnosis not present

## 2016-11-24 DIAGNOSIS — Z79899 Other long term (current) drug therapy: Secondary | ICD-10-CM | POA: Diagnosis not present

## 2016-12-07 ENCOUNTER — Ambulatory Visit (INDEPENDENT_AMBULATORY_CARE_PROVIDER_SITE_OTHER): Payer: Medicare Other | Admitting: Urology

## 2016-12-07 DIAGNOSIS — N3281 Overactive bladder: Secondary | ICD-10-CM | POA: Diagnosis not present

## 2016-12-07 DIAGNOSIS — R351 Nocturia: Secondary | ICD-10-CM

## 2016-12-13 DIAGNOSIS — C7951 Secondary malignant neoplasm of bone: Secondary | ICD-10-CM | POA: Diagnosis not present

## 2016-12-13 DIAGNOSIS — C61 Malignant neoplasm of prostate: Secondary | ICD-10-CM | POA: Diagnosis not present

## 2016-12-13 DIAGNOSIS — M5410 Radiculopathy, site unspecified: Secondary | ICD-10-CM | POA: Diagnosis not present

## 2016-12-16 ENCOUNTER — Other Ambulatory Visit: Payer: Self-pay | Admitting: Internal Medicine

## 2016-12-16 DIAGNOSIS — M542 Cervicalgia: Secondary | ICD-10-CM

## 2016-12-20 ENCOUNTER — Other Ambulatory Visit: Payer: Self-pay | Admitting: Internal Medicine

## 2016-12-20 DIAGNOSIS — M542 Cervicalgia: Secondary | ICD-10-CM

## 2016-12-20 DIAGNOSIS — M5126 Other intervertebral disc displacement, lumbar region: Secondary | ICD-10-CM

## 2016-12-20 DIAGNOSIS — M5136 Other intervertebral disc degeneration, lumbar region: Secondary | ICD-10-CM

## 2016-12-29 DIAGNOSIS — C61 Malignant neoplasm of prostate: Secondary | ICD-10-CM | POA: Diagnosis not present

## 2016-12-29 DIAGNOSIS — E782 Mixed hyperlipidemia: Secondary | ICD-10-CM | POA: Diagnosis not present

## 2017-01-03 DIAGNOSIS — Z6824 Body mass index (BMI) 24.0-24.9, adult: Secondary | ICD-10-CM | POA: Diagnosis not present

## 2017-01-03 DIAGNOSIS — I2510 Atherosclerotic heart disease of native coronary artery without angina pectoris: Secondary | ICD-10-CM | POA: Diagnosis not present

## 2017-01-03 DIAGNOSIS — C7951 Secondary malignant neoplasm of bone: Secondary | ICD-10-CM | POA: Diagnosis not present

## 2017-01-03 DIAGNOSIS — M5127 Other intervertebral disc displacement, lumbosacral region: Secondary | ICD-10-CM | POA: Diagnosis not present

## 2017-01-03 DIAGNOSIS — Z0000 Encounter for general adult medical examination without abnormal findings: Secondary | ICD-10-CM | POA: Diagnosis not present

## 2017-01-03 DIAGNOSIS — I10 Essential (primary) hypertension: Secondary | ICD-10-CM | POA: Diagnosis not present

## 2017-01-03 DIAGNOSIS — C61 Malignant neoplasm of prostate: Secondary | ICD-10-CM | POA: Diagnosis not present

## 2017-01-03 DIAGNOSIS — I25810 Atherosclerosis of coronary artery bypass graft(s) without angina pectoris: Secondary | ICD-10-CM | POA: Diagnosis not present

## 2017-01-03 DIAGNOSIS — R946 Abnormal results of thyroid function studies: Secondary | ICD-10-CM | POA: Diagnosis not present

## 2017-01-04 ENCOUNTER — Ambulatory Visit
Admission: RE | Admit: 2017-01-04 | Discharge: 2017-01-04 | Disposition: A | Discharge status: Home / Self Care | Payer: Medicare Other | Source: Ambulatory Visit | Attending: Internal Medicine | Admitting: Internal Medicine

## 2017-01-04 DIAGNOSIS — M5136 Other intervertebral disc degeneration, lumbar region: Secondary | ICD-10-CM

## 2017-01-04 DIAGNOSIS — M47816 Spondylosis without myelopathy or radiculopathy, lumbar region: Secondary | ICD-10-CM | POA: Diagnosis not present

## 2017-01-04 DIAGNOSIS — M5126 Other intervertebral disc displacement, lumbar region: Secondary | ICD-10-CM

## 2017-01-04 DIAGNOSIS — M542 Cervicalgia: Secondary | ICD-10-CM

## 2017-01-10 ENCOUNTER — Ambulatory Visit: Payer: Medicare Other | Admitting: Cardiology

## 2017-01-16 ENCOUNTER — Ambulatory Visit (INDEPENDENT_AMBULATORY_CARE_PROVIDER_SITE_OTHER): Payer: Medicare Other | Admitting: Otolaryngology

## 2017-01-18 ENCOUNTER — Encounter: Payer: Self-pay | Admitting: Cardiology

## 2017-01-23 DIAGNOSIS — H401122 Primary open-angle glaucoma, left eye, moderate stage: Secondary | ICD-10-CM | POA: Diagnosis not present

## 2017-01-30 ENCOUNTER — Other Ambulatory Visit: Payer: Self-pay | Admitting: Cardiology

## 2017-02-02 ENCOUNTER — Other Ambulatory Visit: Payer: Self-pay | Admitting: Internal Medicine

## 2017-02-02 DIAGNOSIS — G8929 Other chronic pain: Secondary | ICD-10-CM

## 2017-02-02 DIAGNOSIS — M545 Low back pain: Principal | ICD-10-CM

## 2017-02-06 ENCOUNTER — Ambulatory Visit: Payer: Medicare Other | Admitting: Cardiology

## 2017-02-09 ENCOUNTER — Ambulatory Visit (INDEPENDENT_AMBULATORY_CARE_PROVIDER_SITE_OTHER): Payer: Medicare Other | Admitting: Cardiology

## 2017-02-09 ENCOUNTER — Encounter: Payer: Self-pay | Admitting: Cardiology

## 2017-02-09 VITALS — BP 124/78 | HR 80 | Ht 66.0 in | Wt 153.0 lb

## 2017-02-09 DIAGNOSIS — C61 Malignant neoplasm of prostate: Secondary | ICD-10-CM

## 2017-02-09 DIAGNOSIS — I2510 Atherosclerotic heart disease of native coronary artery without angina pectoris: Secondary | ICD-10-CM

## 2017-02-09 DIAGNOSIS — I10 Essential (primary) hypertension: Secondary | ICD-10-CM | POA: Diagnosis not present

## 2017-02-09 DIAGNOSIS — E782 Mixed hyperlipidemia: Secondary | ICD-10-CM | POA: Diagnosis not present

## 2017-02-09 NOTE — Progress Notes (Signed)
Cardiology Office Note  Date: 02/09/2017   ID: Samuel Gregory, DOB 1940-09-14, MRN [id number]  PCP: Celene Squibb, MD  Primary Cardiologist: Rozann Lesches, MD   Chief Complaint  Patient presents with  . Coronary Artery Disease    History of Present Illness: Samuel Gregory is a 76 y.o. male last seen in June 2017. He presents for a routine follow-up visit. He is not reporting any regular angina symptoms or nitroglycerin use. He has been limited due to right sided sciatica pain. States that he is having an injection done tomorrow in fact. He is hopeful that this will help him be more active and get back to a walking plan.  Last ischemic testing was in 2012, we discussed arranging a follow-up study. He tells me that he does not want to pursue test at this point but will consider it for next year's visit. I did talk with him about warning signs and letting us know if any symptoms escalate in the meanwhile.  He continues to follow with Dr. Nevada Crane, had interval lab work done in May which is outlined below.  Past Medical History:  Diagnosis Date  . Arthritis   . Coronary atherosclerosis of native coronary artery    Multivessel status post CABG 2000  . Essential hypertension, benign   . Hyperlipidemia   . Prostate cancer [facility])     Past Surgical History:  Procedure Laterality Date  . APPENDECTOMY     8 yrs ago- Dr Geroge Baseman  . CATARACT EXTRACTION W/PHACO Left 07/14/2014   Procedure: CATARACT EXTRACTION PHACO AND INTRAOCULAR LENS PLACEMENT (IOC);  Surgeon: Tonny Branch, MD;  Location: [facility];  Service: Ophthalmology;  Laterality: Left;  CDE:6.20  . CATARACT EXTRACTION W/PHACO Right 08/11/2014   Procedure: CATARACT EXTRACTION PHACO AND INTRAOCULAR LENS PLACEMENT RIGHT EYE;  Surgeon: Tonny Branch, MD;  Location: [facility];  Service: Ophthalmology;  Laterality: Right;  CDE:10.11  . COLONOSCOPY N/A 01/14/2015   Procedure: COLONOSCOPY;  Surgeon: Rogene Houston, MD;  Location[facility];  Service:  Endoscopy;  Laterality: N/A;  730  . CORONARY ARTERY BYPASS GRAFT  01/1999   LIMA to LAD, SVG to diagonal, sequential SVG to PDA and PLB  . PROSTATECTOMY  2004   [name]  . TONSILLECTOMY     age 55    Current Outpatient Prescriptions  Medication Sig Dispense Refill  . acetaminophen (TYLENOL) 500 MG tablet Take 500 mg by mouth once.     Marland Kitchen aspirin 325 MG tablet Take 325 mg by mouth daily.    . Calcium-Magnesium-Vitamin D (CALCIUM 1200+D3 PO) Take 1 tablet by mouth daily.     . fluticasone (FLONASE) 50 MCG/ACT nasal spray Place 1 spray into both nostrils daily as needed for allergies or rhinitis.    Marland Kitchen HYDROcodone-acetaminophen (NORCO/VICODIN) 5-325 MG tablet Take 1 tablet by mouth every 4 (four) hours as needed for moderate pain (Must last 14 days.Do not take and drive a car or use machinery.). 56 tablet 0  . metoprolol succinate (TOPROL-XL) 25 MG 24 hr tablet TAKE (1) TABLET BY MOUTH DAILY OR AS OTHERWISE DIRECTED. 90 tablet 3  . Multiple Vitamin (MULTIVITAMIN WITH MINERALS) TABS Take 1 tablet by mouth daily.    . naproxen sodium (ANAPROX) 220 MG tablet Take 220 mg by mouth daily as needed (pain).    . ondansetron (ZOFRAN ODT) 4 MG disintegrating tablet Take 1 tablet (4 mg total) by mouth every 8 (eight) hours as needed. 10 tablet 1  .  oseltamivir (TAMIFLU) 75 MG capsule Take 1 capsule (75 mg total) by mouth every 12 (twelve) hours. 10 capsule 0  . predniSONE (DELTASONE) 5 MG tablet Take 1 tablet by mouth 2 (two) times daily.  11  . ramipril (ALTACE) 5 MG capsule TAKE (1) CAPSULE BY MOUTH ONCE DAILY. 30 capsule 6  . rosuvastatin (CRESTOR) 20 MG tablet Take 1 tablet (20 mg total) by mouth daily. 90 tablet 3  . Thiamine HCl (VITAMIN B-1 PO) Take 1 tablet by mouth daily.    . TOVIAZ 4 MG TB24 tablet Take 4 mg by mouth daily.   10  . ZYTIGA 250 MG tablet Take 4 tablets by mouth daily.  11   No current facility-administered medications for this visit.    Allergies:  Latex and Sulfa  antibiotics   Social History: The patient  reports that he has never smoked. He has never used smokeless tobacco. He reports that he drinks alcohol. He reports that he does not use drugs.   ROS:  Please see the history of present illness. Otherwise, complete review of systems is positive for right leg and hip pain.  All other systems are reviewed and negative.   Physical Exam: VS:  BP 124/78   Pulse 80   Ht 5\' 6"  (1.676 m)   Wt 153 lb (69.4 kg)   SpO2 97%   BMI 24.69 kg/m , BMI Body mass index is 24.69 kg/m.  Wt Readings from Last 3 Encounters:  02/09/17 153 lb (69.4 kg)  10/06/16 155 lb (70.3 kg)  05/26/16 154 lb 12.8 oz (70.2 kg)    General: Patient appears comfortable at rest. HEENT: Conjunctiva and lids normal, oropharynx clear. Neck: Supple, no elevated JVP or carotid bruits, no thyromegaly. Lungs: Clear to auscultation, nonlabored breathing at rest. Cardiac: Regular rate and rhythm, no S3 or significant systolic murmur, no pericardial rub. Abdomen: Soft, nontender, bowel sounds present, no guarding or rebound. Extremities: No pitting edema, distal pulses 2+. Skin: Warm and dry. Musculoskeletal: No kyphosis. Neuropsychiatric: Alert and oriented x3, affect grossly appropriate.  ECG: I personally reviewed the tracing from 01/11/2016 which showed normal sinus rhythm.  Recent Labwork: 10/06/2016: BUN 26; Creatinine, Ser 1.34; Hemoglobin 14.5; Platelets 168; Potassium 3.5; Sodium 135  May 2018: Hemoglobin 13.5, platelets 191, BUN 24, creatinine 1.1, potassium 4.4, AST 22, ALT 17, cholesterol 202, triglycerides 83, HDL 74, LDL 111  Other Studies Reviewed Today:  Exercise Myoview September 2012: No diagnostic ST segment abnormalities at maximum workload of 10 METs, diaphragmatic attenuation without score or ischemia, LVEF 56%.  Assessment and Plan:  1. Multivessel CAD status post CABG in 2000. Stress testing in 2012 was low risk. He continues to prefer to hold off on repeat  ischemic testing, but will consider it for next year. We did talk about warning signs and to let us know if symptoms escalate unexpectedly.  2. Hyperlipidemia on Crestor. Recent LDL was 111 per Dr. Nevada Crane. Could consider increasing dose to 40 mg daily if tolerated or perhaps adding Zetia.  3. Essential hypertension, blood pressure control is adequate today.  4. Prostate cancer with history of prostatectomy, undergoing treatment guided by [facility] at this time.  Current medicines were reviewed with the patient today.  Disposition: Follow-up in one year, sooner if needed.  Signed, Satira Sark, MD, [facility] 02/09/2017 4:00 PM    [place] at [facility] [address] [place], [place] [address] Phone: [phone]; Fax: [phone]

## 2017-02-09 NOTE — Patient Instructions (Signed)
Your physician wants you to follow-up in: 1 year with Dr McDowell You will receive a reminder letter in the mail two months in advance. If you don't receive a letter, please call our office to schedule the follow-up appointment.    Your physician recommends that you continue on your current medications as directed. Please refer to the Current Medication list given to you today.     If you need a refill on your cardiac medications before your next appointment, please call your pharmacy.     Thank you for choosing [facility] !        

## 2017-02-10 ENCOUNTER — Ambulatory Visit
Admission: RE | Admit: 2017-02-10 | Discharge: 2017-02-10 | Disposition: A | Discharge status: Home / Self Care | Payer: Medicare Other | Source: Ambulatory Visit | Attending: Internal Medicine | Admitting: Internal Medicine

## 2017-02-10 DIAGNOSIS — G8929 Other chronic pain: Secondary | ICD-10-CM

## 2017-02-10 DIAGNOSIS — M47817 Spondylosis without myelopathy or radiculopathy, lumbosacral region: Secondary | ICD-10-CM | POA: Diagnosis not present

## 2017-02-10 DIAGNOSIS — M545 Low back pain: Principal | ICD-10-CM

## 2017-02-10 MED ORDER — METHYLPREDNISOLONE ACETATE 40 MG/ML INJ SUSP (RADIOLOG
120.0000 mg | Freq: Once | INTRAMUSCULAR | Status: AC
  Administered 2017-02-10: 120 mg via EPIDURAL

## 2017-02-10 MED ORDER — IOPAMIDOL (ISOVUE-M 200) INJECTION 41%
1.0000 mL | Freq: Once | INTRAMUSCULAR | Status: AC
  Administered 2017-02-10: 1 mL via EPIDURAL

## 2017-02-10 NOTE — Discharge Instructions (Signed)

## 2017-02-23 DIAGNOSIS — Z79899 Other long term (current) drug therapy: Secondary | ICD-10-CM | POA: Diagnosis not present

## 2017-02-23 DIAGNOSIS — C7951 Secondary malignant neoplasm of bone: Secondary | ICD-10-CM | POA: Diagnosis not present

## 2017-02-23 DIAGNOSIS — C61 Malignant neoplasm of prostate: Secondary | ICD-10-CM | POA: Diagnosis not present

## 2017-03-08 ENCOUNTER — Ambulatory Visit (INDEPENDENT_AMBULATORY_CARE_PROVIDER_SITE_OTHER): Payer: Medicare Other | Admitting: Urology

## 2017-03-08 DIAGNOSIS — N3281 Overactive bladder: Secondary | ICD-10-CM | POA: Diagnosis not present

## 2017-03-08 DIAGNOSIS — R351 Nocturia: Secondary | ICD-10-CM | POA: Diagnosis not present

## 2017-03-08 DIAGNOSIS — C61 Malignant neoplasm of prostate: Secondary | ICD-10-CM | POA: Diagnosis not present

## 2017-03-20 ENCOUNTER — Other Ambulatory Visit: Payer: Self-pay | Admitting: Internal Medicine

## 2017-03-23 ENCOUNTER — Other Ambulatory Visit: Payer: Self-pay | Admitting: Internal Medicine

## 2017-03-23 DIAGNOSIS — M545 Low back pain: Secondary | ICD-10-CM

## 2017-03-23 DIAGNOSIS — G8929 Other chronic pain: Secondary | ICD-10-CM

## 2017-04-18 ENCOUNTER — Ambulatory Visit
Admission: RE | Admit: 2017-04-18 | Discharge: 2017-04-18 | Disposition: A | Discharge status: Home / Self Care | Payer: Medicare Other | Source: Ambulatory Visit | Attending: Internal Medicine | Admitting: Internal Medicine

## 2017-04-18 DIAGNOSIS — M545 Low back pain: Secondary | ICD-10-CM

## 2017-04-18 DIAGNOSIS — M47817 Spondylosis without myelopathy or radiculopathy, lumbosacral region: Secondary | ICD-10-CM | POA: Diagnosis not present

## 2017-04-18 DIAGNOSIS — G8929 Other chronic pain: Secondary | ICD-10-CM

## 2017-04-18 MED ORDER — IOPAMIDOL (ISOVUE-M 200) INJECTION 41%
1.0000 mL | Freq: Once | INTRAMUSCULAR | Status: AC
  Administered 2017-04-18: 1 mL via EPIDURAL

## 2017-04-18 MED ORDER — METHYLPREDNISOLONE ACETATE 40 MG/ML INJ SUSP (RADIOLOG
120.0000 mg | Freq: Once | INTRAMUSCULAR | Status: AC
  Administered 2017-04-18: 120 mg via EPIDURAL

## 2017-04-18 NOTE — Discharge Instructions (Signed)

## 2017-04-26 DIAGNOSIS — H02055 Trichiasis without entropian left lower eyelid: Secondary | ICD-10-CM | POA: Diagnosis not present

## 2017-05-01 DIAGNOSIS — Z23 Encounter for immunization: Secondary | ICD-10-CM | POA: Diagnosis not present

## 2017-05-25 DIAGNOSIS — C7951 Secondary malignant neoplasm of bone: Secondary | ICD-10-CM | POA: Diagnosis not present

## 2017-05-25 DIAGNOSIS — C61 Malignant neoplasm of prostate: Secondary | ICD-10-CM | POA: Diagnosis not present

## 2017-05-25 DIAGNOSIS — Z79899 Other long term (current) drug therapy: Secondary | ICD-10-CM | POA: Diagnosis not present

## 2017-05-30 ENCOUNTER — Other Ambulatory Visit: Payer: Self-pay | Admitting: Cardiology

## 2017-06-06 DIAGNOSIS — H02055 Trichiasis without entropian left lower eyelid: Secondary | ICD-10-CM | POA: Diagnosis not present

## 2017-06-12 ENCOUNTER — Other Ambulatory Visit: Payer: Self-pay | Admitting: Internal Medicine

## 2017-06-12 DIAGNOSIS — M5126 Other intervertebral disc displacement, lumbar region: Secondary | ICD-10-CM

## 2017-06-26 ENCOUNTER — Ambulatory Visit
Admission: RE | Admit: 2017-06-26 | Discharge: 2017-06-26 | Disposition: A | Discharge status: Home / Self Care | Payer: Medicare Other | Source: Ambulatory Visit | Attending: Internal Medicine | Admitting: Internal Medicine

## 2017-06-26 DIAGNOSIS — M5126 Other intervertebral disc displacement, lumbar region: Secondary | ICD-10-CM

## 2017-06-26 DIAGNOSIS — M545 Low back pain: Secondary | ICD-10-CM | POA: Diagnosis not present

## 2017-06-26 MED ORDER — METHYLPREDNISOLONE ACETATE 40 MG/ML INJ SUSP (RADIOLOG
120.0000 mg | Freq: Once | INTRAMUSCULAR | Status: AC
  Administered 2017-06-26: 120 mg via EPIDURAL

## 2017-06-26 MED ORDER — IOPAMIDOL (ISOVUE-M 200) INJECTION 41%
1.0000 mL | Freq: Once | INTRAMUSCULAR | Status: AC
  Administered 2017-06-26: 1 mL via EPIDURAL

## 2017-06-26 NOTE — Discharge Instructions (Signed)

## 2017-06-27 ENCOUNTER — Other Ambulatory Visit: Payer: Self-pay | Admitting: Cardiology

## 2017-07-12 ENCOUNTER — Ambulatory Visit (INDEPENDENT_AMBULATORY_CARE_PROVIDER_SITE_OTHER): Payer: Medicare Other | Admitting: Urology

## 2017-07-12 DIAGNOSIS — N3281 Overactive bladder: Secondary | ICD-10-CM | POA: Diagnosis not present

## 2017-07-12 DIAGNOSIS — R351 Nocturia: Secondary | ICD-10-CM | POA: Diagnosis not present

## 2017-07-12 DIAGNOSIS — C61 Malignant neoplasm of prostate: Secondary | ICD-10-CM

## 2017-07-30 ENCOUNTER — Emergency Department (HOSPITAL_COMMUNITY)
Admission: EM | Admit: 2017-07-30 | Discharge: 2017-07-30 | Disposition: A | Discharge status: Home / Self Care | Payer: Medicare Other | Attending: Emergency Medicine | Admitting: Emergency Medicine

## 2017-07-30 ENCOUNTER — Other Ambulatory Visit: Payer: Self-pay

## 2017-07-30 ENCOUNTER — Encounter (HOSPITAL_COMMUNITY): Payer: Self-pay | Admitting: Emergency Medicine

## 2017-07-30 DIAGNOSIS — Z79899 Other long term (current) drug therapy: Secondary | ICD-10-CM | POA: Insufficient documentation

## 2017-07-30 DIAGNOSIS — S46212A Strain of muscle, fascia and tendon of other parts of biceps, left arm, initial encounter: Secondary | ICD-10-CM

## 2017-07-30 DIAGNOSIS — S46222A Laceration of muscle, fascia and tendon of other parts of biceps, left arm, initial encounter: Secondary | ICD-10-CM | POA: Diagnosis not present

## 2017-07-30 DIAGNOSIS — I2510 Atherosclerotic heart disease of native coronary artery without angina pectoris: Secondary | ICD-10-CM | POA: Diagnosis not present

## 2017-07-30 DIAGNOSIS — Z8546 Personal history of malignant neoplasm of prostate: Secondary | ICD-10-CM | POA: Insufficient documentation

## 2017-07-30 DIAGNOSIS — X58XXXA Exposure to other specified factors, initial encounter: Secondary | ICD-10-CM | POA: Insufficient documentation

## 2017-07-30 DIAGNOSIS — S4992XA Unspecified injury of left shoulder and upper arm, initial encounter: Secondary | ICD-10-CM | POA: Diagnosis present

## 2017-07-30 DIAGNOSIS — Y939 Activity, unspecified: Secondary | ICD-10-CM | POA: Insufficient documentation

## 2017-07-30 DIAGNOSIS — Z951 Presence of aortocoronary bypass graft: Secondary | ICD-10-CM | POA: Diagnosis not present

## 2017-07-30 DIAGNOSIS — I10 Essential (primary) hypertension: Secondary | ICD-10-CM | POA: Diagnosis not present

## 2017-07-30 DIAGNOSIS — Y999 Unspecified external cause status: Secondary | ICD-10-CM | POA: Diagnosis not present

## 2017-07-30 DIAGNOSIS — Z7982 Long term (current) use of aspirin: Secondary | ICD-10-CM | POA: Diagnosis not present

## 2017-07-30 DIAGNOSIS — Z91040 Latex allergy status: Secondary | ICD-10-CM | POA: Diagnosis not present

## 2017-07-30 DIAGNOSIS — Y929 Unspecified place or not applicable: Secondary | ICD-10-CM | POA: Insufficient documentation

## 2017-07-30 MED ORDER — METHOCARBAMOL 500 MG PO TABS: 500.0000 mg | ORAL_TABLET | Freq: Two times a day (BID) | ORAL | 0 refills | Status: DC

## 2017-07-30 NOTE — ED Triage Notes (Signed)
Patient c/o bruising to left upper arm. Per patient was lifting some things yesterday, dropped a pice of lumber and went to catch it, felt a pop. Patient had swelling yesterday, used ice pack wioth improvement. Large amount of brusing noted today. CNS intact. Patient states takes 325mg  of aspirin.

## 2017-07-30 NOTE — Discharge Instructions (Signed)
Apply ice pack several times a day.  Ace wrap for compression.  Sling as needed for pain.  Follow-up with orthopedics.  Take Tylenol for pain.  Take Robaxin for muscle spasms as needed.

## 2017-07-30 NOTE — ED Provider Notes (Signed)
[facility] EMERGENCY DEPARTMENT Provider Note   CSN: [id number] Arrival date & time: 07/30/17  1009     History   Chief Complaint Chief Complaint  Patient presents with  . Bleeding/Bruising    HPI Samuel Gregory is a 76 y.o. male.  HPI Samuel Gregory is a 76 y.o. male presents to emergency department complaining of left upper arm injury.  Patient states he was moving lumbar wood, states he dropped one piece of wood, and caught it with his left arm.  Patient immediately felt a sting in his left bicep.  He states he was able to finish working, but when he took his shirt off to take a shower, he noticed bruising to the left upper arm.  He reports pain and spasms to the arm since then.  Bruising has gotten much worse today.  He did apply ice pack which helped with swelling.  He denies any numbness or weakness to his hand.  He takes full dose aspirin, no other blood thinners.  He reports similar injury to the right bicep several years ago.  He denies any pain to his shoulder joint or his elbow joint.  He has not fallen or sustained any other injuries.  Past Medical History:  Diagnosis Date  . Arthritis   . Coronary atherosclerosis of native coronary artery    Multivessel status post CABG 2000  . Essential hypertension, benign   . Hyperlipidemia   . Prostate cancer [facility])     Patient Active Problem List   Diagnosis Date Noted  . Essential hypertension 01/23/2013  . Coronary atherosclerosis of native coronary artery 01/23/2013  . Mixed hyperlipidemia 01/23/2013    Past Surgical History:  Procedure Laterality Date  . APPENDECTOMY     8 yrs ago- Dr Geroge Baseman  . CATARACT EXTRACTION W/PHACO Left 07/14/2014   Procedure: CATARACT EXTRACTION PHACO AND INTRAOCULAR LENS PLACEMENT (IOC);  Surgeon: Tonny Branch, MD;  Location: [facility] ORS;  Service: Ophthalmology;  Laterality: Left;  CDE:6.20  . CATARACT EXTRACTION W/PHACO Right 08/11/2014   Procedure: CATARACT EXTRACTION PHACO AND INTRAOCULAR LENS  PLACEMENT RIGHT EYE;  Surgeon: Tonny Branch, MD;  Location: [facility] ORS;  Service: Ophthalmology;  Laterality: Right;  CDE:10.11  . COLONOSCOPY N/A 01/14/2015   Procedure: COLONOSCOPY;  Surgeon: Rogene Houston, MD;  Location: [facility] ENDO SUITE;  Service: Endoscopy;  Laterality: N/A;  730  . CORONARY ARTERY BYPASS GRAFT  01/1999   LIMA to LAD, SVG to diagonal, sequential SVG to PDA and PLB  . [place]  2004   [name]  . TONSILLECTOMY     age 76       Home Medications    Prior to Admission medications   Medication Sig Start Date End Date Taking? Authorizing Provider  acetaminophen (TYLENOL) 500 MG tablet Take 500 mg by mouth once.     [provider]  aspirin 325 MG tablet Take 325 mg by mouth daily.    [provider]  Calcium-Magnesium-Vitamin D (CALCIUM 1200+D3 PO) Take 1 tablet by mouth daily.     [provider]  fluticasone (FLONASE) 50 MCG/ACT nasal spray Place 1 spray into both nostrils daily as needed for allergies or rhinitis.    [provider]  HYDROcodone-acetaminophen (NORCO/VICODIN) 5-325 MG tablet Take 1 tablet by mouth every 4 (four) hours as needed for moderate pain (Must last 14 days.Do not take and drive a car or use machinery.). 05/19/16   Sanjuana Kava, MD  metoprolol succinate (TOPROL-XL) 25 MG 24 hr  tablet TAKE (1) TABLET BY MOUTH DAILY OR AS OTHERWISE DIRECTED. 01/30/17   Satira Sark, MD  Multiple Vitamin (MULTIVITAMIN WITH MINERALS) TABS Take 1 tablet by mouth daily.    [provider]  naproxen sodium (ANAPROX) 220 MG tablet Take 220 mg by mouth daily as needed (pain).    [provider]  ondansetron (ZOFRAN ODT) 4 MG disintegrating tablet Take 1 tablet (4 mg total) by mouth every 8 (eight) hours as needed. 10/06/16   Fredia Sorrow, MD  oseltamivir (TAMIFLU) 75 MG capsule Take 1 capsule (75 mg total) by mouth every 12 (twelve) hours. 10/06/16   Fredia Sorrow, MD  predniSONE (DELTASONE) 5 MG tablet  Take 1 tablet by mouth 2 (two) times daily. 09/20/16   [provider]  ramipril (ALTACE) 5 MG capsule TAKE (1) CAPSULE BY MOUTH ONCE DAILY. 06/27/17   Satira Sark, MD  rosuvastatin (CRESTOR) 20 MG tablet Take 1 tablet (20 mg total) by mouth daily. 10/19/16   Satira Sark, MD  Thiamine HCl (VITAMIN B-1 PO) Take 1 tablet by mouth daily.    [provider]  TOVIAZ 4 MG TB24 tablet Take 4 mg by mouth daily.  01/25/17   [provider]  ZYTIGA 250 MG tablet Take 4 tablets by mouth daily. 09/20/16   [provider]    Family History Family History  Problem Relation Age of Onset  . Heart attack Mother   . Cancer Mother   . Aneurysm Father     Social History Social History   Tobacco Use  . Smoking status: Never Smoker  . Smokeless tobacco: Never Used  Substance Use Topics  . Alcohol use: Yes    Alcohol/week: 0.0 oz    Comment: daily-glass of wine  . Drug use: No     Allergies   Latex and Sulfa antibiotics   Review of Systems Review of Systems  Constitutional: Negative for chills and fever.  Respiratory: Negative for cough, chest tightness and shortness of breath.   Cardiovascular: Negative for chest pain, palpitations and leg swelling.  Gastrointestinal: Negative for abdominal distention, abdominal pain, diarrhea, nausea and vomiting.  Musculoskeletal: Positive for arthralgias. Negative for myalgias, neck pain and neck stiffness.  Skin: Positive for color change. Negative for rash.  Allergic/Immunologic: Negative for immunocompromised state.  Neurological: Negative for dizziness, weakness, light-headedness, numbness and headaches.     Physical Exam Updated Vital Signs BP 136/84 (BP Location: Right Arm)   Pulse 80   Temp 98.1 F (36.7 C) (Oral)   Resp 18   Ht 5\' 6"  (1.676 m)   Wt 72.6 kg (160 lb)   SpO2 100%   BMI 25.82 kg/m   Physical Exam  Constitutional: He appears well-developed and well-nourished. No distress.    Eyes: Conjunctivae are normal.  Neck: Neck supple.  Cardiovascular: Normal rate.  Pulmonary/Chest: No respiratory distress.  Abdominal: He exhibits no distension.  Musculoskeletal:  Extensive bruising to the left anterior upper arm over the bicep distribution.  Full range of motion of the shoulder and elbow joint.  Biceps strength is intact, 5 out of 5.  Tenderness over biceps and at the proximal insertion point.  Distal radial pulses intact and equal bilaterally.  Normal hand, pink, warm, capillary refill less than 2 seconds.  Grip strength is 5 out of 5.  Skin: Skin is warm and dry.  Nursing note and vitals reviewed.    ED Treatments / Results  Labs (all labs ordered are listed, but only  abnormal results are displayed) Labs Reviewed - No data to display  EKG  EKG Interpretation None       Radiology No results found.  Procedures Procedures (including critical care time)  Medications Ordered in ED Medications - No data to display   Initial Impression / Assessment and Plan / ED Course  I have reviewed the triage vital signs and the nursing notes.  Pertinent labs & imaging results that were available during my care of the patient were reviewed by me and considered in my medical decision making (see chart for details).     Patient with extensive bruising, pain, spasming over the left bicep.  Mechanism of injury and exam most consistent with bicep strain versus a tear.  Will treat with compression with an Ace wrap, sling, will prescribe low-dose of muscle relaxant, follow-up with orthopedics.  Vitals:   07/30/17 1024 07/30/17 1025  BP: 136/84   Pulse: 80   Resp: 18   Temp: 98.1 F (36.7 C)   TempSrc: Oral   SpO2: 100%   Weight:  72.6 kg (160 lb)  Height:  5\' 6"  (1.676 m)     Final Clinical Impressions(s) / ED Diagnoses   Final diagnoses:  Biceps muscle tear, left, initial encounter    ED Discharge Orders        Ordered    methocarbamol (ROBAXIN) 500 MG  tablet  2 times daily     07/30/17 1128       Jeannett Senior, PA-C 07/30/17 1438    Nat Christen, MD 08/01/17 1358

## 2017-08-02 DIAGNOSIS — S46212A Strain of muscle, fascia and tendon of other parts of biceps, left arm, initial encounter: Secondary | ICD-10-CM | POA: Diagnosis not present

## 2017-08-23 DIAGNOSIS — C7951 Secondary malignant neoplasm of bone: Secondary | ICD-10-CM | POA: Diagnosis not present

## 2017-08-23 DIAGNOSIS — M25551 Pain in right hip: Secondary | ICD-10-CM | POA: Diagnosis not present

## 2017-08-23 DIAGNOSIS — C61 Malignant neoplasm of prostate: Secondary | ICD-10-CM | POA: Diagnosis not present

## 2017-08-23 DIAGNOSIS — Z79899 Other long term (current) drug therapy: Secondary | ICD-10-CM | POA: Diagnosis not present

## 2017-08-23 DIAGNOSIS — G8929 Other chronic pain: Secondary | ICD-10-CM | POA: Diagnosis not present

## 2017-08-24 ENCOUNTER — Ambulatory Visit (INDEPENDENT_AMBULATORY_CARE_PROVIDER_SITE_OTHER): Payer: Medicare Other

## 2017-08-24 ENCOUNTER — Ambulatory Visit (INDEPENDENT_AMBULATORY_CARE_PROVIDER_SITE_OTHER): Payer: Medicare Other | Admitting: Orthopaedic Surgery

## 2017-08-24 ENCOUNTER — Encounter: Payer: Self-pay | Admitting: Orthopaedic Surgery

## 2017-08-24 ENCOUNTER — Ambulatory Visit: Payer: Medicare Other

## 2017-08-24 VITALS — BP 141/85 | HR 86 | Ht 66.0 in | Wt 160.0 lb

## 2017-08-24 DIAGNOSIS — M25512 Pain in left shoulder: Secondary | ICD-10-CM

## 2017-08-24 DIAGNOSIS — M25511 Pain in right shoulder: Secondary | ICD-10-CM | POA: Diagnosis not present

## 2017-08-24 DIAGNOSIS — G8929 Other chronic pain: Secondary | ICD-10-CM

## 2017-08-24 NOTE — Progress Notes (Signed)
Patient Samuel Gregory, male DOB:01-15-1941, 77 y.o. MVE:[id number]  Chief Complaint  Patient presents with  . Shoulder Pain    bilateral     HPI  Samuel Gregory is a 77 y.o. male who has been seen in the past for left shoulder pain.  He has right shoulder pain today that has been present several weeks.  It is getting progressively worse.  He has no trauma to the right shoulder.  About 10 days ago he had a rupture of the left biceps tendon treated by another physician.  He is recovering from that.  He has pain with overhead use of the right shoulder and extension.  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  He has no numbness.  Heat and ice help only a little. HPI  Body mass index is 25.82 kg/m.  ROS  Review of Systems  HENT: Negative for congestion.   Respiratory: Negative for cough and shortness of breath.   Cardiovascular: Negative for chest pain and leg swelling.  Endocrine: Negative for cold intolerance.  Musculoskeletal: Positive for arthralgias, back pain and joint swelling.  Allergic/Immunologic: Negative for environmental allergies.  All other systems reviewed and are negative.   Past Medical History:  Diagnosis Date  . Arthritis   . Coronary atherosclerosis of native coronary artery    Multivessel status post CABG 2000  . Essential hypertension, benign   . Hyperlipidemia   . Prostate cancer [facility])     Past Surgical History:  Procedure Laterality Date  . APPENDECTOMY     8 yrs ago- Dr Geroge Baseman  . CATARACT EXTRACTION W/PHACO Left 07/14/2014   Procedure: CATARACT EXTRACTION PHACO AND INTRAOCULAR LENS PLACEMENT (IOC);  Surgeon: Tonny Branch, MD;  Location: [facility] ORS;  Service: Ophthalmology;  Laterality: Left;  CDE:6.20  . CATARACT EXTRACTION W/PHACO Right 08/11/2014   Procedure: CATARACT EXTRACTION PHACO AND INTRAOCULAR LENS PLACEMENT RIGHT EYE;  Surgeon: Tonny Branch, MD;  Location: [facility] ORS;  Service: Ophthalmology;  Laterality: Right;   CDE:10.11  . COLONOSCOPY N/A 01/14/2015   Procedure: COLONOSCOPY;  Surgeon: Rogene Houston, MD;  Location: [facility] ENDO [facility];  Service: Endoscopy;  Laterality: N/A;  730  . CORONARY ARTERY BYPASS GRAFT  01/1999   LIMA to LAD, SVG to diagonal, sequential SVG to PDA and PLB  . PROST[place]CTOMY  2004   [name]  . TONSILLECTOMY     age 5    Family History  Problem Relation Age of Onset  . Heart attack Mother   . Cancer Mother   . Aneurysm Father     Social History Social History   Tobacco Use  . Smoking status: Never Smoker  . Smokeless tobacco: Never Used  Substance Use Topics  . Alcohol use: Yes    Alcohol/week: 0.0 oz    Comment: daily-glass of wine  . Drug use: No    Allergies  Allergen Reactions  . Latex Rash  . Sulfa Antibiotics Rash    Current Outpatient Medications  Medication Sig Dispense Refill  . acetaminophen (TYLENOL) 500 MG tablet Take 500 mg by mouth once.     Marland Kitchen aspirin 325 MG tablet Take 325 mg by mouth daily.    . Calcium-Magnesium-Vitamin D (CALCIUM 1200+D3 PO) Take 1 tablet by mouth daily.     . fluticasone (FLONASE) 50 MCG/ACT nasal spray Place 1 spray into both nostrils daily as needed for allergies or rhinitis.    Marland Kitchen HYDROcodone-acetaminophen (NORCO/VICODIN) 5-325 MG tablet Take 1 tablet  by mouth every 4 (four) hours as needed for moderate pain (Must last 14 days.Do not take and drive a car or use machinery.). 56 tablet 0  . methocarbamol (ROBAXIN) 500 MG tablet Take 1 tablet (500 mg total) by mouth 2 (two) times daily. 20 tablet 0  . metoprolol succinate (TOPROL-XL) 25 MG 24 hr tablet TAKE (1) TABLET BY MOUTH DAILY OR AS OTHERWISE DIRECTED. 90 tablet 3  . Mirabegron (MYRBETRIQ PO) Take by mouth.    . Multiple Vitamin (MULTIVITAMIN WITH MINERALS) TABS Take 1 tablet by mouth daily.    . naproxen sodium (ANAPROX) 220 MG tablet Take 220 mg by mouth daily as needed (pain).    . ondansetron (ZOFRAN ODT) 4 MG disintegrating tablet Take 1 tablet (4 mg  total) by mouth every 8 (eight) hours as needed. 10 tablet 1  . predniSONE (DELTASONE) 5 MG tablet Take 1 tablet by mouth 2 (two) times daily.  11  . ramipril (ALTACE) 5 MG capsule TAKE (1) CAPSULE BY MOUTH ONCE DAILY. 30 capsule 11  . rosuvastatin (CRESTOR) 20 MG tablet Take 1 tablet (20 mg total) by mouth daily. 90 tablet 3  . Thiamine HCl (VITAMIN B-1 PO) Take 1 tablet by mouth daily.    Marland Kitchen ZYTIGA 250 MG tablet Take 4 tablets by mouth daily.  11  . TOVIAZ 4 MG TB24 tablet Take 4 mg by mouth daily.   10   No current facility-administered medications for this visit.      Physical Exam  Blood pressure (!) 141/85, pulse 86, height 5\' 6"  (1.676 m), weight 160 lb (72.6 kg).  Constitutional: overall normal hygiene, normal nutrition, well developed, normal grooming, normal body habitus. Assistive device:none  Musculoskeletal: gait and station Limp none, muscle tone and strength are normal, no tremors or atrophy is present.  .  Neurological: coordination overall normal.  Deep tendon reflex/nerve stretch intact.  Sensation normal.  Cranial nerves II-XII intact.   Skin:   Normal overall no scars, lesions, ulcers or rashes. No psoriasis.  Psychiatric: Alert and oriented x 3.  Recent memory intact, remote memory unclear.  Normal mood and affect. Well groomed.  Good eye contact.  Cardiovascular: overall no swelling, no varicosities, no edema bilaterally, normal temperatures of the legs and arms, no clubbing, cyanosis and good capillary refill.  Lymphatic: palpation is normal.  Examination of right Upper Extremity is done.  Inspection:   Overall:  Elbow non-tender without crepitus or defects, forearm non-tender without crepitus or defects, wrist non-tender without crepitus or defects, hand non-tender.    Shoulder: with glenohumeral joint tenderness, without effusion.   Upper arm: without swelling and tenderness   Range of motion:   Overall:  Full range of motion of the elbow, full range of  motion of wrist and full range of motion in fingers.   Shoulder:  right  165 degrees forward flexion; 150 degrees abduction; 30 degrees internal rotation, 30 degrees external rotation, 10 degrees extension, 40 degrees adduction.   Stability:   Overall:  Shoulder, elbow and wrist stable   Strength and Tone:   Overall full shoulder muscles strength, full upper arm strength and normal upper arm bulk and tone.  All other systems reviewed and are negative   The patient has been educated about the nature of the problem(s) and counseled on treatment options.  The patient appeared to understand what I have discussed and is in agreement with it.  X-rays were done of the right shoulder, reported separately.  Encounter Diagnoses  Name Primary?  . Acute pain of right shoulder Yes  . Chronic left shoulder pain    PROCEDURE NOTE:  The patient request injection, verbal consent was obtained.  The right shoulder was prepped appropriately after time out was performed.   Sterile technique was observed and injection of 1 cc of Depo-Medrol 40 mg with several cc's of plain xylocaine. Anesthesia was provided by ethyl chloride and a 20-gauge needle was used to inject the shoulder area. A posterior approach was used.  The injection was tolerated well.  A band aid dressing was applied.  The patient was advised to apply ice later today and tomorrow to the injection sight as needed.  PROCEDURE NOTE:  The patient request injection, verbal consent was obtained.  The left shoulder was prepped appropriately after time out was performed.   Sterile technique was observed and injection of 1 cc of Depo-Medrol 40 mg with several cc's of plain xylocaine. Anesthesia was provided by ethyl chloride and a 20-gauge needle was used to inject the shoulder area. A posterior approach was used.  The injection was tolerated well.  A band aid dressing was applied.  The patient was advised to apply ice later today and  tomorrow to the injection sight as needed.   PLAN Call if any problems.  Precautions discussed.  Continue current medications.   Return to clinic 2 weeks   Electronically Zuehl, MD 1/24/20192:16 PM

## 2017-09-07 ENCOUNTER — Ambulatory Visit (INDEPENDENT_AMBULATORY_CARE_PROVIDER_SITE_OTHER): Payer: Medicare Other | Admitting: Orthopaedic Surgery

## 2017-09-07 ENCOUNTER — Encounter: Payer: Self-pay | Admitting: Orthopaedic Surgery

## 2017-09-07 DIAGNOSIS — M25511 Pain in right shoulder: Secondary | ICD-10-CM

## 2017-09-07 DIAGNOSIS — G8929 Other chronic pain: Secondary | ICD-10-CM | POA: Diagnosis not present

## 2017-09-07 NOTE — Progress Notes (Signed)
PROCEDURE NOTE:  The patient request injection, verbal consent was obtained.  The right shoulder was prepped appropriately after time out was performed.   Sterile technique was observed and injection of 1 cc of Depo-Medrol 40 mg with several cc's of plain xylocaine. Anesthesia was provided by ethyl chloride and a 20-gauge needle was used to inject the shoulder area. A posterior approach was used.  The injection was tolerated well.  A band aid dressing was applied.  The patient was advised to apply ice later today and tomorrow to the injection sight as needed.  Return prn  Call if any problem.  Precautions discussed.   Electronically Signed Sanjuana Kava, MD 2/7/20192:35 PM

## 2017-10-02 DIAGNOSIS — Z1283 Encounter for screening for malignant neoplasm of skin: Secondary | ICD-10-CM | POA: Diagnosis not present

## 2017-10-02 DIAGNOSIS — Z08 Encounter for follow-up examination after completed treatment for malignant neoplasm: Secondary | ICD-10-CM | POA: Diagnosis not present

## 2017-10-02 DIAGNOSIS — Z85828 Personal history of other malignant neoplasm of skin: Secondary | ICD-10-CM | POA: Diagnosis not present

## 2017-10-02 DIAGNOSIS — L570 Actinic keratosis: Secondary | ICD-10-CM | POA: Diagnosis not present

## 2017-10-02 DIAGNOSIS — X32XXXD Exposure to sunlight, subsequent encounter: Secondary | ICD-10-CM | POA: Diagnosis not present

## 2017-10-17 ENCOUNTER — Other Ambulatory Visit: Payer: Self-pay | Admitting: Cardiology

## 2017-10-20 DIAGNOSIS — M48062 Spinal stenosis, lumbar region with neurogenic claudication: Secondary | ICD-10-CM | POA: Diagnosis not present

## 2017-10-31 ENCOUNTER — Other Ambulatory Visit: Payer: Self-pay

## 2017-10-31 DIAGNOSIS — I2510 Atherosclerotic heart disease of native coronary artery without angina pectoris: Secondary | ICD-10-CM

## 2017-11-01 ENCOUNTER — Other Ambulatory Visit: Payer: Self-pay | Admitting: Cardiology

## 2017-11-07 ENCOUNTER — Encounter (HOSPITAL_COMMUNITY): Payer: Self-pay

## 2017-11-07 ENCOUNTER — Encounter (HOSPITAL_COMMUNITY)
Admission: RE | Admit: 2017-11-07 | Discharge: 2017-11-07 | Disposition: A | Discharge status: Home / Self Care | Payer: Medicare Other | Source: Ambulatory Visit | Attending: Cardiology | Admitting: Cardiology

## 2017-11-07 ENCOUNTER — Ambulatory Visit (HOSPITAL_COMMUNITY)
Admission: RE | Admit: 2017-11-07 | Discharge: 2017-11-07 | Disposition: A | Discharge status: Home / Self Care | Payer: Medicare Other | Source: Ambulatory Visit | Attending: Cardiology | Admitting: Cardiology

## 2017-11-07 DIAGNOSIS — R9439 Abnormal result of other cardiovascular function study: Secondary | ICD-10-CM | POA: Diagnosis not present

## 2017-11-07 DIAGNOSIS — I2510 Atherosclerotic heart disease of native coronary artery without angina pectoris: Secondary | ICD-10-CM

## 2017-11-07 HISTORY — DX: Systemic involvement of connective tissue, unspecified: M35.9

## 2017-11-07 LAB — NM MYOCAR MULTI W/SPECT W/WALL MOTION / EF
LHR: 0.32
LV dias vol: 69 mL (ref 62–150)
LVSYSVOL: 27 mL
NUC STRESS TID: 1.22
Peak HR: 113 {beats}/min
Rest HR: 67 {beats}/min
SDS: 5
SRS: 0
SSS: 5

## 2017-11-07 MED ORDER — TECHNETIUM TC 99M TETROFOSMIN IV KIT
30.0000 | PACK | Freq: Once | INTRAVENOUS | Status: AC | PRN
  Administered 2017-11-07: 31 via INTRAVENOUS

## 2017-11-07 MED ORDER — SODIUM CHLORIDE 0.9% FLUSH
INTRAVENOUS | Status: AC
  Administered 2017-11-07: 10 mL via INTRAVENOUS
  Filled 2017-11-07: qty 10

## 2017-11-07 MED ORDER — REGADENOSON 0.4 MG/5ML IV SOLN
INTRAVENOUS | Status: AC
  Administered 2017-11-07: 0.4 mg via INTRAVENOUS
  Filled 2017-11-07: qty 5

## 2017-11-07 MED ORDER — TECHNETIUM TC 99M TETROFOSMIN IV KIT
10.0000 | PACK | Freq: Once | INTRAVENOUS | Status: AC | PRN
  Administered 2017-11-07: 11 via INTRAVENOUS

## 2017-11-08 ENCOUNTER — Encounter (HOSPITAL_COMMUNITY): Payer: Self-pay

## 2017-11-08 ENCOUNTER — Telehealth: Payer: Self-pay | Admitting: Cardiology

## 2017-11-08 ENCOUNTER — Other Ambulatory Visit: Payer: Self-pay

## 2017-11-08 ENCOUNTER — Ambulatory Visit (HOSPITAL_COMMUNITY): Payer: Medicare Other | Attending: Orthopedic Surgery

## 2017-11-08 DIAGNOSIS — R262 Difficulty in walking, not elsewhere classified: Secondary | ICD-10-CM | POA: Insufficient documentation

## 2017-11-08 DIAGNOSIS — G8929 Other chronic pain: Secondary | ICD-10-CM | POA: Diagnosis not present

## 2017-11-08 DIAGNOSIS — M5441 Lumbago with sciatica, right side: Secondary | ICD-10-CM | POA: Insufficient documentation

## 2017-11-08 DIAGNOSIS — M79651 Pain in right thigh: Secondary | ICD-10-CM | POA: Diagnosis not present

## 2017-11-08 NOTE — Therapy (Signed)
[place], [place], [address] Phone: [phone]   Fax:  [phone]  Physical Therapy Evaluation  Patient Details  Name: Samuel Gregory MRN: [id number] Date of Birth: 04-06-1941 Referring Provider: Marzetta Board   Encounter Date: 11/08/2017  PT End of Session - 11/08/17 1217    Visit Number  1    Number of Visits  12    Date for PT Re-Evaluation  12/20/17 mini-reassess date 11/29/2017    Authorization Type  Medicare Part A and BCBS Supplement    PT Start Time  1118    PT Stop Time  1201    PT Time Calculation (min)  43 min    Activity Tolerance  Patient tolerated treatment well    Behavior During Therapy  [facility] for tasks assessed/performed       Past Medical History:  Diagnosis Date  . Arthritis   . Collagen vascular disease ([place])   . Coronary atherosclerosis of native coronary artery    Multivessel status post CABG 2000  . Essential hypertension, benign   . Hyperlipidemia   . Prostate cancer [facility])     Past Surgical History:  Procedure Laterality Date  . APPENDECTOMY     8 yrs ago- Dr Geroge Baseman  . CATARACT EXTRACTION W/PHACO Left 07/14/2014   Procedure: CATARACT EXTRACTION PHACO AND INTRAOCULAR LENS PLACEMENT (IOC);  Surgeon: Tonny Branch, MD;  Location: [facility] OR[facility];  Service: Ophthalmology;  Laterality: Left;  CDE:6.20  . CATARACT EXTRACTION W/PHACO Right 08/11/2014   Procedure: CATARACT EXTRACTION PHACO AND INTRAOCULAR LENS PLACEMENT RIGHT EYE;  Surgeon: Tonny Branch, MD;  Location: [facility] ORS;  Service: Ophthalmology;  Laterality: Right;  CDE:10.11  . COLONOSCOPY N/A 01/14/2015   Procedure: COLONOSCOPY;  Surgeon: Rogene Houston, MD;  Location: [facility] END[facility] SU[facility];  Service: Endoscopy;  Laterality: N/A;  730  . CORONARY ARTERY BYPASS GRAFT  01/1999   LIMA to LAD, SVG to diagonal, sequential SVG to PDA and PLB  . [place]Y  2004   [name]  . TONSILLECTOMY     age 77    There were no vitals filed  for this visit.   Subjective Assessment - 11/08/17 1134    Subjective  Increasing LBP and both legs over the past 4-5 years, primarily rt leg. Got really bad July 2018 while spending several days at [place].  Limiting wakling and standing. Symptoms increase with activity and will be bad the rest of the day. Lumbar surgery planned for 12/19/2017.    Pertinent History  prostate cancer, 5 heart stents, lumbar stenosis with bil leg pain    Limitations  Lifting;Standing;Walking;House hold activities    How long can you sit comfortably?  not limited    How long can you stand comfortably?  sometimes 2 minutes; other times 30 minutes    How long can you walk comfortably?  on a good day 10 mins; bad day 2 mins    Diagnostic tests  xr, MRI    Patient Stated Goals  to get ready for surgery    Currently in Pain?  Yes    Pain Score  2     Pain Location  Back across back Lt and Rt; down Rt leg buttock to thigh    Pain Orientation  Right    Pain Descriptors / Indicators  Sore;Shooting;Numbness numbness Rt leg    Pain Type  Chronic pain    Pain Radiating Towards  Rt buttock and thigh    Pain  Onset  More than a month ago    Pain Frequency  Intermittent    Aggravating Factors   standing, walking, lifting, physical activity requiring movement    Pain Relieving Factors  sitting, lying down    Effect of Pain on Daily Activities  pain can limit activity several days after a more active day.         [facility] PT Assessment - 11/08/17 0001      Assessment   Medical Diagnosis  lumbar stensosi    Referring Provider  Marzetta Board    Onset Date/Surgical Date  10/31/17    Prior Therapy  No      Precautions   Precautions  None      Balance Screen   Has the patient fallen in the past 6 months  No    Has the patient had a decrease in activity level because of a fear of falling?   Yes    Is the patient reluctant to leave their home because of a fear of falling?   No      Prior Function   Level of  Independence  Independent    Vocation  Full time employment    Programmer, [employer] [place]   Overall Cognitive Status  Within Functional Limits for tasks assessed      Observation/Other Assessments   Focus on Therapeutic Outcomes (FOTO)   57% limited      Sensation   Light Touch  Appears Intact      Posture/Postural Control   Posture/Postural Control  Postural limitations    Postural Limitations  Rounded Shoulders;Forward head;Decreased lumbar lordosis;Increased thoracic kyphosis      ROM / Strength   AROM / PROM / Strength  AROM;Strength      AROM   AROM Assessment Site  Lumbar    Lumbar Flexion  75%  a little pain    Lumbar Extension  75%    Lumbar - Right Side Bend  50% stretch    Lumbar - Left Side Bend  50% stretch    Lumbar - Right Rotation  75% stretch    Lumbar - Left Rotation  75%      Strength   Strength Assessment Site  Hip;Knee;Ankle;Lumbar      Flexibility   Hamstrings  Rt/Lt mildly limited      Palpation   Spinal mobility  decreased segmental central and unilateral PAs mobility in LSpine and especially in TSpine      Special Tests    Special Tests  Lumbar    Lumbar Tests  Slump Test      Slump test   Findings  Negative    Comment  bilateral                Objective measurements completed on examination: See above findings.              PT Education - 11/08/17 1216    Education provided  Yes    Education Details  regarding diagnosis, reasoning behind PT before surgery, clinical judgement    Person(s) Educated  Patient    Methods  Explanation;Handout    Comprehension  Verbalized understanding       PT Short Term Goals - 11/08/17 1230      PT SHORT TERM GOAL #1   Title  Patient will regularly and independently perform HEP.    Time  3    Period  Weeks  Status  New    Target Date  11/29/17      PT SHORT TERM GOAL #2   Title  Patient will be able to walk for 10 minutes consistently  without increases in pain.    Baseline  2 mins    Time  3    Period  Weeks      PT SHORT TERM GOAL #3   Title  Patient will be able to stand for 10 minutes consistently without increases in pain.    Baseline  2 mins    Time  3    Period  Weeks    Status  New      PT SHORT TERM GOAL #4   Title  Patient will be able to lift a 5# object off the floor exhibiting good body mechanics and no increases in pain.    Time  3    Period  Weeks    Status  New        PT Long Term Goals - 11/08/17 1233      PT LONG TERM GOAL #1   Title  Patient will regularly and independently perform advanced HEP.    Time  6    Period  Weeks    Status  New    Target Date  12/20/17      PT LONG TERM GOAL #2   Title  Patient will be able to walk for 20 minutes consistently without increases in pain to improve his ability to perform his work duties.    Time  6    Period  Weeks    Status  New      PT LONG TERM GOAL #3   Title  Patient will be able to stand for 20 minutes consistently without increases in pain to improve his ability to perform his work duties.    Time  6    Period  Weeks    Status  New             Plan - 11/08/17 1221    Clinical Impression Statement  Patient is a 77 year old male with diagnosis of lumbar stenosis planning for spinal surgery 12/19/2017 on L4-5, L5-S1 here to receive OPPT for instruction on a core strengthening program and hopefully have some pain relief. He exhibits deficits in lumbar spine AROM, decreased segmental spinal mobility, postural impairments, increases in pain with functional activities and decreased ability to perform daily and work tasks due to pain.    History and Personal Factors relevant to plan of care:  prostate cancer, heart surgery 5 stents    Clinical Presentation  Stable    Clinical Presentation due to:  AROM, functional mobility, lumbar stenosis on imaging, postural deficits    Rehab Potential  Fair    Clinical Impairments Affecting Rehab  Potential  negative - chronicity of condition, amplitude pain increases with too much activity; positive - motivated, pleasant    PT Frequency  2x / week    PT Duration  6 weeks    PT Treatment/Interventions  ADLs/Self Care Home Management;Cryotherapy;Electrical Stimulation;Ultrasound;Traction;Moist Heat;Iontophoresis 4mg /ml Dexamethasone;Gait training;Stair training;Functional mobility training;Therapeutic activities;Therapeutic exercise;Balance training;Neuromuscular re-education;Patient/family education;Manual techniques;Scar mobilization;Passive range of motion;Dry needling;Energy conservation    PT Next Visit Plan  review goals and HEP, assess bil LE strength and balance; initiate level I abdominal stab exercises    PT Home Exercise Plan  initial - McKenzie book    Consulted and Agree with Plan of Care  Patient  Patient will benefit from skilled therapeutic intervention in order to improve the following deficits and impairments:  Pain, Decreased mobility, Postural dysfunction, Decreased activity tolerance, Decreased range of motion, Difficulty walking  Visit Diagnosis: Difficulty in walking, not elsewhere classified  Chronic right-sided low back pain with right-sided sciatica  Pain in right thigh     Problem List Patient Active Problem List   Diagnosis Date Noted  . Essential hypertension 01/23/2013  . Coronary atherosclerosis of native coronary artery 01/23/2013  . Mixed hyperlipidemia 01/23/2013    Floria Raveling. Hartnett-Rands, MS, PT Per [facility] #[id number] 11/08/2017, 12:38 PM  [place] [address] [place], [place], [address] Phone: [phone]   Fax:  [phone]  Name: Samuel Gregory MRN: [id number] Date of Birth: [birth year]

## 2017-11-08 NOTE — Addendum Note (Signed)
Addended by: Jeannie Done on: 11/08/2017 12:43 PM   Modules accepted: Orders

## 2017-11-08 NOTE — Patient Instructions (Signed)
Robin McKenzie "How to Have a Pain Free Life" Exs 1-4

## 2017-11-08 NOTE — Telephone Encounter (Signed)
Patient notified

## 2017-11-08 NOTE — Telephone Encounter (Signed)
Returning call from [facility] yesterday for stress test results. / tg

## 2017-11-09 ENCOUNTER — Ambulatory Visit (HOSPITAL_COMMUNITY): Payer: Medicare Other

## 2017-11-09 DIAGNOSIS — R262 Difficulty in walking, not elsewhere classified: Secondary | ICD-10-CM

## 2017-11-09 DIAGNOSIS — M79651 Pain in right thigh: Secondary | ICD-10-CM | POA: Diagnosis not present

## 2017-11-09 DIAGNOSIS — G8929 Other chronic pain: Secondary | ICD-10-CM

## 2017-11-09 DIAGNOSIS — M5441 Lumbago with sciatica, right side: Secondary | ICD-10-CM | POA: Diagnosis not present

## 2017-11-09 NOTE — Therapy (Signed)
[place], [place], [address] Phone: [phone]   Fax:  [phone]  Physical Therapy Treatment  Patient Details  Name: Samuel Gregory MRN: [id number] Date of Birth: 25-Apr-1941 Referring Provider: Marzetta Board   Encounter Date: 11/09/2017  PT End of Session - 11/09/17 0951    Visit Number  2    Number of Visits  12    Date for PT Re-Evaluation  12/20/17    Authorization Type  Medicare Part A and BCBS Supplement; mini reassess 11/29/17, Reeval 12/20/17    PT Start Time  0909    PT Stop Time  0951    PT Time Calculation (min)  42 min    Activity Tolerance  Patient tolerated treatment well    Behavior During Therapy  [facility] for tasks assessed/performed       Past Medical History:  Diagnosis Date  . Arthritis   . Collagen vascular disease ([place])   . Coronary atherosclerosis of native coronary artery    Multivessel status post CABG 2000  . Essential hypertension, benign   . Hyperlipidemia   . Prostate cancer [facility])     Past Surgical History:  Procedure Laterality Date  . APPENDECTOMY     8 yrs ago- Dr Geroge Baseman  . CATARACT EXTRACTION W/PHACO Left 07/14/2014   Procedure: CATARACT EXTRACTION PHACO AND INTRAOCULAR LENS PLACEMENT (IOC);  Surgeon: Tonny Branch, MD;  Location: [facility];  Service: Ophthalmology;  Laterality: Left;  CDE:6.20  . CATARACT EXTRACTION W/PHACO Right 08/11/2014   Procedure: CATARACT EXTRACTION PHACO AND INTRAOCULAR LENS PLACEMENT RIGHT EYE;  Surgeon: Tonny Branch, MD;  Location: [facility];  Service: Ophthalmology;  Laterality: Right;  CDE:10.11  . COLONOSCOPY N/A 01/14/2015   Procedure: COLONOSCOPY;  Surgeon: Rogene Houston, MD;  Location: [facility];  Service: Endoscopy;  Laterality: N/A;  730  . CORONARY ARTERY BYPASS GRAFT  01/1999   LIMA to LAD, SVG to diagonal, sequential SVG to PDA and PLB  . PROST[place]CTOMY  2004   [name]  . TONSILLECTOMY     age 77    There were no vitals  filed for this visit.  Subjective Assessment - 11/09/17 0912    Subjective  Pt was evaluated yesterday, reports to be a little sore for yesterday's stretches and evaluation. Otherwise feeling good today.     Pertinent History  prostate cancer, 5 heart stents, lumbar stenosis with bil leg pain    Currently in Pain?  Yes    Pain Score  5     Pain Location  Back Right buttock/lateral leg to ankle    Pain Orientation  Right    Pain Descriptors / Indicators  Aching;Tingling                       [facility] Adult PT Treatment/Exercise - 11/09/17 0001      Exercises   Exercises  Lumbar      Lumbar Exercises: Stretches   Single Knee to Chest Stretch  5 reps;30 seconds    Double Knee to Chest Stretch  1 rep;Other (comment) gentle oscillation 25x    Lower Trunk Rotation  3 reps;30 seconds open book stretches      Lumbar Exercises: Standing   Other Standing Lumbar Exercises  Repeated extension in standing: 1x15 with towel       Manual Therapy   Manual Therapy  Myofascial release    Manual therapy comments  MFR  bilat L3  QL x 6 minutes             PT Education - 11/08/17 1216    Education provided  Yes    Education Details  regarding diagnosis, reasoning behind PT before surgery, clinical judgement    Person(s) Educated  Patient    Methods  Explanation;Handout    Comprehension  Verbalized understanding       PT Short Term Goals - 11/08/17 1230      PT SHORT TERM GOAL #1   Title  Patient will regularly and independently perform HEP.    Time  3    Period  Weeks    Status  New    Target Date  11/29/17      PT SHORT TERM GOAL #2   Title  Patient will be able to walk for 10 minutes consistently without increases in pain.    Baseline  2 mins    Time  3    Period  Weeks      PT SHORT TERM GOAL #3   Title  Patient will be able to stand for 10 minutes consistently without increases in pain.    Baseline  2 mins    Time  3    Period  Weeks    Status  New       PT SHORT TERM GOAL #4   Title  Patient will be able to lift a 5# object off the floor exhibiting good body mechanics and no increases in pain.    Time  3    Period  Weeks    Status  New        PT Long Term Goals - 11/08/17 1233      PT LONG TERM GOAL #1   Title  Patient will regularly and independently perform advanced HEP.    Time  6    Period  Weeks    Status  New    Target Date  12/20/17      PT LONG TERM GOAL #2   Title  Patient will be able to walk for 20 minutes consistently without increases in pain to improve his ability to perform his work duties.    Time  6    Period  Weeks    Status  New      PT LONG TERM GOAL #3   Title  Patient will be able to stand for 20 minutes consistently without increases in pain to improve his ability to perform his work duties.    Time  6    Period  Weeks    Status  New            Plan - 11/09/17 2595    Clinical Impression Statement  Reviewed examination, goals of PT short term and long term, with pt in agreement. Commenced education on core strengthening. Pt reports no symptoms response in referral pain nor back pain with exercise or treatment. Pt issued handout with HEP.     Rehab Potential  Fair    Clinical Impairments Affecting Rehab Potential  negative - chronicity of condition, amplitude pain increases with too much activity; positive - motivated, pleasant    PT Frequency  2x / week    PT Duration  6 weeks    PT Treatment/Interventions  ADLs/Self Care Home Management;Cryotherapy;Electrical Stimulation;Ultrasound;Traction;Moist Heat;Iontophoresis 4mg /ml Dexamethasone;Gait training;Stair training;Functional mobility training;Therapeutic activities;Therapeutic exercise;Balance training;Neuromuscular re-education;Patient/family education;Manual techniques;Scar mobilization;Passive range of motion;Dry needling;Energy conservation    PT Next Visit Plan  Review HEP from visit  2; continue focus on lumbar mobility.     PT Home Exercise  Plan  *see handout    Consulted and Agree with Plan of Care  Patient       Patient will benefit from skilled therapeutic intervention in order to improve the following deficits and impairments:  Pain, Decreased mobility, Postural dysfunction, Decreased activity tolerance, Decreased range of motion, Difficulty walking  Visit Diagnosis: Difficulty in walking, not elsewhere classified  Chronic right-sided low back pain with right-sided sciatica  Pain in right thigh     Problem List Patient Active Problem List   Diagnosis Date Noted  . Essential hypertension 01/23/2013  . Coronary atherosclerosis of native coronary artery 01/23/2013  . Mixed hyperlipidemia 01/23/2013   9:54 AM, 11/09/17 Etta Grandchild, PT, DPT Physical Therapist - [place] [phone] (Office)   Etta Grandchild 11/09/2017, 9:54 AM  [place] [address] [place], [place], [address] Phone: [phone]   Fax:  [phone]  Name: Samuel Gregory MRN: [id number] Date of Birth: 1940-09-23

## 2017-11-14 ENCOUNTER — Encounter (HOSPITAL_COMMUNITY): Payer: Self-pay

## 2017-11-14 ENCOUNTER — Ambulatory Visit (HOSPITAL_COMMUNITY): Payer: Medicare Other

## 2017-11-14 DIAGNOSIS — G8929 Other chronic pain: Secondary | ICD-10-CM | POA: Diagnosis not present

## 2017-11-14 DIAGNOSIS — M5441 Lumbago with sciatica, right side: Secondary | ICD-10-CM

## 2017-11-14 DIAGNOSIS — M79651 Pain in right thigh: Secondary | ICD-10-CM | POA: Diagnosis not present

## 2017-11-14 DIAGNOSIS — R262 Difficulty in walking, not elsewhere classified: Secondary | ICD-10-CM | POA: Diagnosis not present

## 2017-11-14 NOTE — Patient Instructions (Signed)
Isometric Abdominal    Lying on back with knees bent, tighten stomach by pressing elbows down. Hold __5_ seconds. Repeat _10___ times per set. Do _1___ sets per session. Do __2__ sessions per day.  http://orth.exer.us/1086   Copyright  VHI. All rights reserved.  Anterior Pelvic Tilt (Supine)    Lie on back with knees bent. Arch back and Hold _5___ seconds. Flatten back to relax. Repeat __10__ times. Do 2____ sessions per day.  http://gt2.exer.us/683   Copyright  VHI. All rights reserved.  Supine Unilateral Isometric Abduction    CLAMS _ Lie on back .  Hold _5__ seconds. Do 2___ sets of _10__ repetitions.  Copyright  VHI. All rights reserved.

## 2017-11-14 NOTE — Therapy (Signed)
[place], [place], [address] Phone: [phone]   Fax:  [phone]  Physical Therapy Treatment  Patient Details  Name: Samuel Gregory MRN: [id number] Date of Birth: 06/04/1941 Referring Provider: Marzetta Board   Encounter Date: 11/14/2017  PT End of Session - 11/14/17 0927    Visit Number  3    Number of Visits  12    Date for PT Re-Evaluation  12/20/17    Authorization Type  Medicare Part A and BCBS Supplement; mini reassess 11/29/17, Reeval 12/20/17    PT Start Time  0903    PT Stop Time  0945    PT Time Calculation (min)  42 min    Activity Tolerance  Patient tolerated treatment well    Behavior During Therapy  [facility] for tasks assessed/performed       Past Medical History:  Diagnosis Date  . Arthritis   . Collagen vascular disease ([place])   . Coronary atherosclerosis of native coronary artery    Multivessel status post CABG 2000  . Essential hypertension, benign   . Hyperlipidemia   . Prostate cancer [facility])     Past Surgical History:  Procedure Laterality Date  . APPENDECTOMY     8 yrs ago- Dr Geroge Baseman  . CATARACT EXTRACTION W/PHACO Left 07/14/2014   Procedure: CATARACT EXTRACTION PHACO AND INTRAOCULAR LENS PLACEMENT (IOC);  Surgeon: Tonny Branch, MD;  Location: [facility];  Service: Ophthalmology;  Laterality: Left;  CDE:6.20  . CATARACT EXTRACTION W/PHACO Right 08/11/2014   Procedure: CATARACT EXTRACTION PHACO AND INTRAOCULAR LENS PLACEMENT RIGHT EYE;  Surgeon: Tonny Branch, MD;  Location: [facility] ORS;  Service: Ophthalmology;  Laterality: Right;  CDE:10.11  . COLONOSCOPY N/A 01/14/2015   Procedure: COLONOSCOPY;  Surgeon: Rogene Houston, MD;  Location: [facility] ENDO SU[facility];  Service: Endoscopy;  Laterality: N/A;  730  . CORONARY ARTERY BYPASS GRAFT  01/1999   LIMA to LAD, SVG to diagonal, sequential SVG to PDA and PLB  . PROST[place]OMY  2004   [name]  . TONSILLECTOMY     age 77    There were no vitals  filed for this visit.  Subjective Assessment - 11/14/17 0909    Subjective  PT has been in a lot of pain since last session; didn't even take a weekend trip because hurt so much. Still very sore and right hip pain today. Starting to notice pain across low back and into left hip.    Pertinent History  prostate cancer, 5 heart stents, lumbar stenosis with bil leg pain    Pain Score  8     Pain Location  Hip    Pain Orientation  Right;Posterior    Pain Descriptors / Indicators  Aching;Tingling    Pain Type  Chronic pain    Pain Radiating Towards  rt hip to thigh tingling    Pain Onset  More than a month ago                       [facility] Adult PT Treatment/Exercise - 11/14/17 0001      Lumbar Exercises: Standing   Other Standing Lumbar Exercises  Repeated extension       Lumbar Exercises: Supine   Ab Set  10 reps;5 seconds    Pelvic Tilt  10 reps;5 seconds    Clam  10 reps;5 seconds      Lumbar Exercises: Prone   Other Prone Lumbar Exercises  prone 3  min, POE 3 min, prone press ups x8 and reverse             PT Education - 11/14/17 0926    Education provided  Yes    Education Details  anatomy of spine, body mechanics, McKenzie exs 1-4, HEP    Person(s) Educated  Patient    Methods  Explanation    Comprehension  Verbalized understanding       PT Short Term Goals - 11/08/17 1230      PT SHORT TERM GOAL #1   Title  Patient will regularly and independently perform HEP.    Time  3    Period  Weeks    Status  New    Target Date  11/29/17      PT SHORT TERM GOAL #2   Title  Patient will be able to walk for 10 minutes consistently without increases in pain.    Baseline  2 mins    Time  3    Period  Weeks      PT SHORT TERM GOAL #3   Title  Patient will be able to stand for 10 minutes consistently without increases in pain.    Baseline  2 mins    Time  3    Period  Weeks    Status  New      PT SHORT TERM GOAL #4   Title  Patient will be able to lift  a 5# object off the floor exhibiting good body mechanics and no increases in pain.    Time  3    Period  Weeks    Status  New        PT Long Term Goals - 11/08/17 1233      PT LONG TERM GOAL #1   Title  Patient will regularly and independently perform advanced HEP.    Time  6    Period  Weeks    Status  New    Target Date  12/20/17      PT LONG TERM GOAL #2   Title  Patient will be able to walk for 20 minutes consistently without increases in pain to improve his ability to perform his work duties.    Time  6    Period  Weeks    Status  New      PT LONG TERM GOAL #3   Title  Patient will be able to stand for 20 minutes consistently without increases in pain to improve his ability to perform his work duties.    Time  6    Period  Weeks    Status  New            Plan - 11/14/17 0263    Clinical Impression Statement  Patient did not respond well to previous sessions exs and treatment. Attempted McKenzie extension exercises 1-4. Positive pain and radiating pain response post prone exercises especially. Not much change with exs 4 standing ex. Given as part of HEP plus ab set, pelvic tilt and clam shells. If continued positive response to extension exercises and level 1 stab exercises, add to encourage abdominal strength and spinal mobility.     Rehab Potential  Fair    Clinical Impairments Affecting Rehab Potential  negative - chronicity of condition, amplitude pain increases with too much activity; positive - motivated, pleasant    PT Frequency  2x / week    PT Duration  6 weeks    PT Treatment/Interventions  ADLs/Self Care  Home Management;Cryotherapy;Electrical Stimulation;Ultrasound;Traction;Moist Heat;Iontophoresis 4mg /ml Dexamethasone;Gait training;Stair training;Functional mobility training;Therapeutic activities;Therapeutic exercise;Balance training;Neuromuscular re-education;Patient/family education;Manual techniques;Scar mobilization;Passive range of motion;Dry  needling;Energy conservation    PT Next Visit Plan  Review HEP from visit 3; if continued positive response (decreased pain and centralization of sx) to extension exercises and level 1 stab exercises, add to encourage abdominal strength and spinal mobility.    PT Home Exercise Plan  11/14/2017 - ab set, pelvic tilt, supine clams, McKenzie ex 1-3 progression (prone 3 min, POE 3 min, prone pressup x8, prone 3 min, POE 3 min)    Consulted and Agree with Plan of Care  Patient       Patient will benefit from skilled therapeutic intervention in order to improve the following deficits and impairments:  Pain, Decreased mobility, Postural dysfunction, Decreased activity tolerance, Decreased range of motion, Difficulty walking  Visit Diagnosis: Difficulty in walking, not elsewhere classified  Chronic right-sided low back pain with right-sided sciatica  Pain in right thigh     Problem List Patient Active Problem List   Diagnosis Date Noted  . Essential hypertension 01/23/2013  . Coronary atherosclerosis of native coronary artery 01/23/2013  . Mixed hyperlipidemia 01/23/2013    Floria Raveling. Hartnett-Rands, MS, PT [facility] #[id number] 11/14/2017, 6:17 PM  [place] [address] [place], [place], [address] Phone: [phone]   Fax:  [phone]  Name: Samuel Gregory MRN: [id number] Date of Birth: October 15, 1940

## 2017-11-15 ENCOUNTER — Ambulatory Visit (INDEPENDENT_AMBULATORY_CARE_PROVIDER_SITE_OTHER): Payer: Medicare Other | Admitting: Urology

## 2017-11-15 DIAGNOSIS — N3281 Overactive bladder: Secondary | ICD-10-CM

## 2017-11-15 DIAGNOSIS — C61 Malignant neoplasm of prostate: Secondary | ICD-10-CM

## 2017-11-15 DIAGNOSIS — R351 Nocturia: Secondary | ICD-10-CM | POA: Diagnosis not present

## 2017-11-16 ENCOUNTER — Encounter (HOSPITAL_COMMUNITY): Payer: Self-pay

## 2017-11-16 ENCOUNTER — Ambulatory Visit (HOSPITAL_COMMUNITY): Payer: Medicare Other

## 2017-11-16 ENCOUNTER — Other Ambulatory Visit: Payer: Self-pay | Admitting: Internal Medicine

## 2017-11-16 DIAGNOSIS — M5441 Lumbago with sciatica, right side: Secondary | ICD-10-CM

## 2017-11-16 DIAGNOSIS — G8929 Other chronic pain: Secondary | ICD-10-CM

## 2017-11-16 DIAGNOSIS — M79651 Pain in right thigh: Secondary | ICD-10-CM

## 2017-11-16 DIAGNOSIS — M545 Low back pain: Principal | ICD-10-CM

## 2017-11-16 DIAGNOSIS — R262 Difficulty in walking, not elsewhere classified: Secondary | ICD-10-CM | POA: Diagnosis not present

## 2017-11-16 NOTE — Therapy (Signed)
[place], [place], [address] Phone: [phone]   Fax:  [phone]  Physical Therapy Treatment  Patient Details  Name: Samuel Gregory MRN: [id number] Date of Birth: July 30, 1941 Referring Provider: Marzetta Board   Encounter Date: 11/16/2017  PT End of Session - 11/16/17 0912    Visit Number  4    Number of Visits  12    Date for PT Re-Evaluation  12/20/17 minireassess 11/29/2017    Authorization Type  Medicare Part A and BCBS Supplement; mini reassess 11/29/17, Reeval 12/20/17    PT Start Time  0903    PT Stop Time  0942    PT Time Calculation (min)  39 min    Activity Tolerance  Patient tolerated treatment well    Behavior During Therapy  [facility] for tasks assessed/performed       Past Medical History:  Diagnosis Date  . Arthritis   . Collagen vascular disease ([place])   . Coronary atherosclerosis of native coronary artery    Multivessel status post CABG 2000  . Essential hypertension, benign   . Hyperlipidemia   . Prostate cancer [facility])     Past Surgical History:  Procedure Laterality Date  . APPENDECTOMY     8 yrs ago- Dr Geroge Baseman  . CATARACT EXTRACTION W/PHACO Left 07/14/2014   Procedure: CATARACT EXTRACTION PHACO AND INTRAOCULAR LENS PLACEMENT (IOC);  Surgeon: Tonny Branch, MD;  Location: [facility];  Service: Ophthalmology;  Laterality: Left;  CDE:6.20  . CATARACT EXTRACTION W/PHACO Right 08/11/2014   Procedure: CATARACT EXTRACTION PHACO AND INTRAOCULAR LENS PLACEMENT RIGHT EYE;  Surgeon: Tonny Branch, MD;  Location: [facility];  Service: Ophthalmology;  Laterality: Right;  CDE:10.11  . COLONOSCOPY N/A 01/14/2015   Procedure: COLONOSCOPY;  Surgeon: Rogene Houston, MD;  Location: [facility];  Service: Endoscopy;  Laterality: N/A;  730  . CORONARY ARTERY BYPASS GRAFT  01/1999   LIMA to LAD, SVG to diagonal, sequential SVG to PDA and PLB  . PROST[place]CTOMY  2004   [name]  . TONSILLECTOMY     age 77     There were no vitals filed for this visit.  Subjective Assessment - 11/16/17 0907    Subjective  Pt stated he continues to have pain in lower back and minimal radicular symptoms down lateral Rt LE to toes,  mainly tingling.  Current pain scale 6/10.    Patient Stated Goals  to get ready for surgery    Currently in Pain?  Yes    Pain Score  6     Pain Location  Back    Pain Orientation  Lower;Left;Right;Posterior    Pain Descriptors / Indicators  Tingling;Sore    Pain Type  Chronic pain    Pain Onset  More than a month ago    Pain Frequency  Intermittent    Aggravating Factors   standing. walking, lifting, physical activity requring movement    Pain Relieving Factors  sitting, lying down     Effect of Pain on Daily Activities  pain can limit activity several days after a more active day.                       [facility] Adult PT Treatment/Exercise - 11/16/17 0001      Lumbar Exercises: Stretches   Standing Extension  10 reps;5 seconds    Prone on Elbows Stretch  -- 3'    Press Ups  10 reps;10  seconds      Lumbar Exercises: Standing   Other Standing Lumbar Exercises  Repeated extension     Other Standing Lumbar Exercises  1 lap (241ft) at EOS to assess pain level and gait mechanics      Lumbar Exercises: Supine   Ab Set  10 reps;5 seconds    Pelvic Tilt  10 reps;5 seconds    Pelvic Tilt Limitations  cueing for abdominal contraction vs. gluteal rotation    Clam  10 reps;5 seconds RTB    Bent Knee Raise  10 reps;5 seconds    Bridge  10 reps;3 seconds      Lumbar Exercises: Quadruped   Other Quadruped Lumbar Exercises  heel squeeze 10x 5"               PT Short Term Goals - 11/08/17 1230      PT SHORT TERM GOAL #1   Title  Patient will regularly and independently perform HEP.    Time  3    Period  Weeks    Status  New    Target Date  11/29/17      PT SHORT TERM GOAL #2   Title  Patient will be able to walk for 10 minutes consistently without  increases in pain.    Baseline  2 mins    Time  3    Period  Weeks      PT SHORT TERM GOAL #3   Title  Patient will be able to stand for 10 minutes consistently without increases in pain.    Baseline  2 mins    Time  3    Period  Weeks    Status  New      PT SHORT TERM GOAL #4   Title  Patient will be able to lift a 5# object off the floor exhibiting good body mechanics and no increases in pain.    Time  3    Period  Weeks    Status  New        PT Long Term Goals - 11/08/17 1233      PT LONG TERM GOAL #1   Title  Patient will regularly and independently perform advanced HEP.    Time  6    Period  Weeks    Status  New    Target Date  12/20/17      PT LONG TERM GOAL #2   Title  Patient will be able to walk for 20 minutes consistently without increases in pain to improve his ability to perform his work duties.    Time  6    Period  Weeks    Status  New      PT LONG TERM GOAL #3   Title  Patient will be able to stand for 20 minutes consistently without increases in pain to improve his ability to perform his work duties.    Time  6    Period  Weeks    Status  New            Plan - 11/16/17 5732    Clinical Impression Statement  Pt reports of reduced radicular symptoms intensity and overall lower LBP upon entrance.  Reports of compliance with new HEP without questions.  Continued with lumbar extension/prone exercises at beginning of session to improved lumbar mobility and assist with pain.  Progressed to core stability exercises with tactile and verbal cueing to improve TA contraction with movements. EOS reports pain reduced to  3/10 and no reports of radicular symptoms.    Rehab Potential  Fair    Clinical Impairments Affecting Rehab Potential  negative - chronicity of condition, amplitude pain increases with too much activity; positive - motivated, pleasant    PT Frequency  1x / week    PT Duration  6 weeks    PT Treatment/Interventions  ADLs/Self Care Home  Management;Cryotherapy;Electrical Stimulation;Ultrasound;Traction;Moist Heat;Iontophoresis 4mg /ml Dexamethasone;Gait training;Stair training;Functional mobility training;Therapeutic activities;Therapeutic exercise;Balance training;Neuromuscular re-education;Patient/family education;Manual techniques;Scar mobilization;Passive range of motion;Dry needling;Energy conservation    PT Next Visit Plan  If continued positive response (decreased pain and centralization of sx) to extension exercises and level 1 stab exercises, add to encourage abdominal strength and spinal mobility.    PT Home Exercise Plan  11/14/2017 - ab set, pelvic tilt, supine clams, McKenzie ex 1-3 progression (prone 3 min, POE 3 min, prone pressup x8, prone 3 min, POE 3 min)       Patient will benefit from skilled therapeutic intervention in order to improve the following deficits and impairments:  Pain, Decreased mobility, Postural dysfunction, Decreased activity tolerance, Decreased range of motion, Difficulty walking  Visit Diagnosis: Difficulty in walking, not elsewhere classified  Chronic right-sided low back pain with right-sided sciatica  Pain in right thigh     Problem List Patient Active Problem List   Diagnosis Date Noted  . Essential hypertension 01/23/2013  . Coronary atherosclerosis of native coronary artery 01/23/2013  . Mixed hyperlipidemia 01/23/2013   Ihor Austin, [place]; [place]  Aldona Lento 11/16/2017, 9:48 AM  [place], [place], [address] Phone: [phone]   Fax:  [phone]  Name: Samuel Gregory MRN: [id number] Date of Birth: 10/14/1940

## 2017-11-21 ENCOUNTER — Ambulatory Visit (HOSPITAL_COMMUNITY): Payer: Medicare Other

## 2017-11-21 ENCOUNTER — Encounter (HOSPITAL_COMMUNITY): Payer: Self-pay

## 2017-11-21 DIAGNOSIS — G8929 Other chronic pain: Secondary | ICD-10-CM | POA: Diagnosis not present

## 2017-11-21 DIAGNOSIS — R262 Difficulty in walking, not elsewhere classified: Secondary | ICD-10-CM | POA: Diagnosis not present

## 2017-11-21 DIAGNOSIS — M5441 Lumbago with sciatica, right side: Secondary | ICD-10-CM | POA: Diagnosis not present

## 2017-11-21 DIAGNOSIS — M79651 Pain in right thigh: Secondary | ICD-10-CM | POA: Diagnosis not present

## 2017-11-21 NOTE — Therapy (Signed)
[place], [place], [address] Phone: [phone]   Fax:  [phone]  Physical Therapy Treatment  Patient Details  Name: Samuel Gregory MRN: [id number] Date of Birth: Sep 24, 1940 Referring Provider: Marzetta Board   Encounter Date: 11/21/2017  PT End of Session - 11/21/17 0916    Visit Number  5    Number of Visits  12    Date for PT Re-Evaluation  12/20/17 minireassess 11/29/17    Authorization Type  Medicare Part A and BCBS Supplement; mini reassess 11/29/17, Reeval 12/20/17    Authorization Time Period  cert: 8/45-->3/64/68    PT Start Time  0903    PT Stop Time  0947    PT Time Calculation (min)  44 min    Activity Tolerance  Patient tolerated treatment well    Behavior During Therapy  [facility] for tasks assessed/performed       Past Medical History:  Diagnosis Date  . Arthritis   . Collagen vascular disease ([place])   . Coronary atherosclerosis of native coronary artery    Multivessel status post CABG 2000  . Essential hypertension, benign   . Hyperlipidemia   . Prostate cancer [facility])     Past Surgical History:  Procedure Laterality Date  . APPENDECTOMY     8 yrs ago- Dr Geroge Baseman  . CATARACT EXTRACTION W/PHACO Left 07/14/2014   Procedure: CATARACT EXTRACTION PHACO AND INTRAOCULAR LENS PLACEMENT (IOC);  Surgeon: Tonny Branch, MD;  Location: [facility];  Service: Ophthalmology;  Laterality: Left;  CDE:6.20  . CATARACT EXTRACTION W/PHACO Right 08/11/2014   Procedure: CATARACT EXTRACTION PHACO AND INTRAOCULAR LENS PLACEMENT RIGHT EYE;  Surgeon: Tonny Branch, MD;  Location: [facility];  Service: Ophthalmology;  Laterality: Right;  CDE:10.11  . COLONOSCOPY N/A 01/14/2015   Procedure: COLONOSCOPY;  Surgeon: Rogene Houston, MD;  Location: [facility];  Service: Endoscopy;  Laterality: N/A;  730  . CORONARY ARTERY BYPASS GRAFT  01/1999   LIMA to LAD, SVG to diagonal, sequential SVG to PDA and PLB  . PROST[place]CTOMY  2004    [name]  . TONSILLECTOMY     age 14    There were no vitals filed for this visit.  Subjective Assessment - 11/21/17 0913    Subjective  Pt stated he is now having pain both sides in lower back and radicular pain down to Rt heel, mainly tingling.  current pain scale 6/10 soreness LBP.  Reports plans for another shot at [facility] this Friday.    Pertinent History  prostate cancer, 5 heart stents, lumbar stenosis with bil leg pain    Patient Stated Goals  to get ready for surgery    Currently in Pain?  Yes    Pain Score  6     Pain Location  Back    Pain Orientation  Lower    Pain Descriptors / Indicators  Tingling;Sore    Pain Type  Chronic pain    Pain Radiating Towards  Rt hip to thigh tingling    Pain Onset  More than a month ago    Pain Frequency  Intermittent    Aggravating Factors   stanidng, walking, lifting, physical activity requiring movement    Pain Relieving Factors  sitting, lying down    Effect of Pain on Daily Activities  pain can limit activity several days after a more active day         [facility] PT Assessment - 11/21/17 0001  Assessment   Medical Diagnosis  lumbar stensosi                   [facility] Adult PT Treatment/Exercise - 11/21/17 0001      Lumbar Exercises: Stretches   Standing Side Bend  Right;Left    Standing Extension  10 reps;5 seconds    Prone on Elbows Stretch  -- 3'    Press Ups  10 reps;10 seconds    Other Lumbar Stretch Exercise  standing rotation 10x      Lumbar Exercises: Supine   Ab Set  10 reps;5 seconds    Pelvic Tilt  10 reps;5 seconds    Pelvic Tilt Limitations  cueing for abdominal contraction vs. gluteal rotation    Bent Knee Raise  10 reps;5 seconds    Bridge  10 reps;3 seconds    Other Supine Lumbar Exercises  Decompression exercise 1-5 5 reps x 3' holds      Lumbar Exercises: Quadruped   Other Quadruped Lumbar Exercises  heel squeeze 10x 5"               PT Short Term Goals -  11/08/17 1230      PT SHORT TERM GOAL #1   Title  Patient will regularly and independently perform HEP.    Time  3    Period  Weeks    Status  New    Target Date  11/29/17      PT SHORT TERM GOAL #2   Title  Patient will be able to walk for 10 minutes consistently without increases in pain.    Baseline  2 mins    Time  3    Period  Weeks      PT SHORT TERM GOAL #3   Title  Patient will be able to stand for 10 minutes consistently without increases in pain.    Baseline  2 mins    Time  3    Period  Weeks    Status  New      PT SHORT TERM GOAL #4   Title  Patient will be able to lift a 5# object off the floor exhibiting good body mechanics and no increases in pain.    Time  3    Period  Weeks    Status  New        PT Long Term Goals - 11/08/17 1233      PT LONG TERM GOAL #1   Title  Patient will regularly and independently perform advanced HEP.    Time  6    Period  Weeks    Status  New    Target Date  12/20/17      PT LONG TERM GOAL #2   Title  Patient will be able to walk for 20 minutes consistently without increases in pain to improve his ability to perform his work duties.    Time  6    Period  Weeks    Status  New      PT LONG TERM GOAL #3   Title  Patient will be able to stand for 20 minutes consistently without increases in pain to improve his ability to perform his work duties.    Time  6    Period  Weeks    Status  New            Plan - 11/21/17 0941    Clinical Impression Statement  Continued session focus with lumbar mobility and  core strengthening for pain assistance.  Progressed to closed chain lumbar mobility exercises and additional decompression exercises in supine for abdominal and postural strengthening.  Pt reports decrease in overall pain at EOS.      Rehab Potential  Fair    Clinical Impairments Affecting Rehab Potential  negative - chronicity of condition, amplitude pain increases with too much activity; positive - motivated, pleasant     PT Frequency  1x / week    PT Duration  6 weeks    PT Treatment/Interventions  ADLs/Self Care Home Management;Cryotherapy;Electrical Stimulation;Ultrasound;Traction;Moist Heat;Iontophoresis 4mg /ml Dexamethasone;Gait training;Stair training;Functional mobility training;Therapeutic activities;Therapeutic exercise;Balance training;Neuromuscular re-education;Patient/family education;Manual techniques;Scar mobilization;Passive range of motion;Dry needling;Energy conservation    PT Next Visit Plan  assess bil LE strength and balance.  F/U with new decompression exercises, add theraband next session is positive result.  If continued positive response (decreased pain and centralization of sx) to extension exercises and level 1 stab exercises, add to encourage abdominal strength and spinal mobility.    PT Home Exercise Plan  11/14/2017 - ab set, pelvic tilt, supine clams, McKenzie ex 1-3 progression (prone 3 min, POE 3 min, prone pressup x8, prone 3 min, POE 3 min); 11/21/17; decompression exercise 1-5       Patient will benefit from skilled therapeutic intervention in order to improve the following deficits and impairments:  Pain, Decreased mobility, Postural dysfunction, Decreased activity tolerance, Decreased range of motion, Difficulty walking  Visit Diagnosis: Difficulty in walking, not elsewhere classified  Chronic right-sided low back pain with right-sided sciatica  Pain in right thigh     Problem List Patient Active Problem List   Diagnosis Date Noted  . Essential hypertension 01/23/2013  . Coronary atherosclerosis of native coronary artery 01/23/2013  . Mixed hyperlipidemia 01/23/2013   Ihor Austin, [place]; [place]   Aldona Lento 11/21/2017, 10:04 AM  [place] [address] [place], [place], [address] Phone: [phone]   Fax:  [phone]  Name: Samuel Gregory MRN: [id number] Date of Birth: Mar 06, 1941

## 2017-11-22 DIAGNOSIS — G8929 Other chronic pain: Secondary | ICD-10-CM | POA: Diagnosis not present

## 2017-11-22 DIAGNOSIS — M5441 Lumbago with sciatica, right side: Secondary | ICD-10-CM | POA: Diagnosis not present

## 2017-11-22 DIAGNOSIS — C7951 Secondary malignant neoplasm of bone: Secondary | ICD-10-CM | POA: Diagnosis not present

## 2017-11-22 DIAGNOSIS — C61 Malignant neoplasm of prostate: Secondary | ICD-10-CM | POA: Diagnosis not present

## 2017-11-22 DIAGNOSIS — Z79818 Long term (current) use of other agents affecting estrogen receptors and estrogen levels: Secondary | ICD-10-CM | POA: Diagnosis not present

## 2017-11-22 DIAGNOSIS — Z79899 Other long term (current) drug therapy: Secondary | ICD-10-CM | POA: Diagnosis not present

## 2017-11-23 ENCOUNTER — Ambulatory Visit (HOSPITAL_COMMUNITY): Payer: Medicare Other

## 2017-11-23 ENCOUNTER — Other Ambulatory Visit: Payer: Self-pay

## 2017-11-23 ENCOUNTER — Encounter (HOSPITAL_COMMUNITY): Payer: Self-pay

## 2017-11-23 DIAGNOSIS — M79651 Pain in right thigh: Secondary | ICD-10-CM

## 2017-11-23 DIAGNOSIS — G8929 Other chronic pain: Secondary | ICD-10-CM

## 2017-11-23 DIAGNOSIS — R262 Difficulty in walking, not elsewhere classified: Secondary | ICD-10-CM | POA: Diagnosis not present

## 2017-11-23 DIAGNOSIS — M5441 Lumbago with sciatica, right side: Secondary | ICD-10-CM

## 2017-11-23 MED ORDER — RAMIPRIL 5 MG PO CAPS: ORAL_CAPSULE | ORAL | 3 refills | Status: DC

## 2017-11-23 NOTE — Therapy (Signed)
[place], [place], [address] Phone: [phone]   Fax:  [phone]  Physical Therapy Treatment  Patient Details  Name: Samuel Gregory MRN: [id number] Date of Birth: 10/21/1940 Referring Provider: Marzetta Board   Encounter Date: 11/23/2017  PT End of Session - 11/23/17 0927    Visit Number  6    Number of Visits  12    Date for PT Re-Evaluation  12/20/17 minireassess 11/29/17    Authorization Type  Medicare Part A and BCBS Supplement; mini reassess 11/29/17, Reeval 12/20/17    Authorization Time Period  cert: 4/62-->01/31/49    PT Start Time  0905    PT Stop Time  0945    PT Time Calculation (min)  40 min    Activity Tolerance  Patient tolerated treatment well;Patient limited by pain;No increased pain pain scale at 7/10 at beginning of session; reduced to 5-6/10 at EOS    Behavior During Therapy  [facility] for tasks assessed/performed       Past Medical History:  Diagnosis Date  . Arthritis   . Collagen vascular disease ([place])   . Coronary atherosclerosis of native coronary artery    Multivessel status post CABG 2000  . Essential hypertension, benign   . Hyperlipidemia   . Prostate cancer [facility])     Past Surgical History:  Procedure Laterality Date  . APPENDECTOMY     8 yrs ago- Dr Geroge Baseman  . CATARACT EXTRACTION W/PHACO Left 07/14/2014   Procedure: CATARACT EXTRACTION PHACO AND INTRAOCULAR LENS PLACEMENT (IOC);  Surgeon: Tonny Branch, MD;  Location: [facility] ORS;  Service: Ophthalmology;  Laterality: Left;  CDE:6.20  . CATARACT EXTRACTION W/PHACO Right 08/11/2014   Procedure: CATARACT EXTRACTION PHACO AND INTRAOCULAR LENS PLACEMENT RIGHT EYE;  Surgeon: Tonny Branch, MD;  Location: [facility] ORS;  Service: Ophthalmology;  Laterality: Right;  CDE:10.11  . COLONOSCOPY N/A 01/14/2015   Procedure: COLONOSCOPY;  Surgeon: Rogene Houston, MD;  Location: [facility] ENDO SUITE;  Service: Endoscopy;  Laterality: N/A;  730  . CORONARY ARTERY  BYPASS GRAFT  01/1999   LIMA to LAD, SVG to diagonal, sequential SVG to PDA and PLB  . [place]Y  2004   [name]  . TONSILLECTOMY     age 77    There were no vitals filed for this visit.  Subjective Assessment - 11/23/17 0909    Subjective  Pt stated he has decided to have 4th shot tomorrow for pain relief prior surgery on 12/19/17.  Pt stated he wishes not to complete any POE today, stated his shoulders were sore following last session.  Stated he had to go to [facility] yesterday and unable to begin new decompression exercise as HEP, no questions pertaining to new exercise.    Pertinent History  prostate cancer, 5 heart stents, lumbar stenosis with bil leg pain    Patient Stated Goals  to get ready for surgery    Currently in Pain?  Yes    Pain Score  7     Pain Location  Back    Pain Orientation  Lower    Pain Descriptors / Indicators  Sore    Pain Type  Chronic pain    Pain Radiating Towards  Rt hip to thgh tingling    Pain Onset  More than a month ago    Pain Frequency  Intermittent    Aggravating Factors   standing, walking, lifting, physical activity requiring movement    Pain Relieving Factors  sitting laying down    Effect of Pain on Daily Activities  pain can limit activity several days after a more active day         [facility] PT Assessment - 11/23/17 0001      Assessment   Medical Diagnosis  lumbar stensosi    Referring Provider  Marzetta Board    Onset Date/Surgical Date  10/31/17    Next MD Visit  4/26: [facility] shot; surgeon 12/06/17    Prior Therapy  No      Strength   Strength Assessment Site  Hip;Knee    Right/Left Hip  Right;Left    Right Hip Flexion  4+/5    Right Hip Extension  3-/5 limited by pain    Right Hip ABduction  4/5    Left Hip Flexion  4+/5    Left Hip Extension  3-/5 limited by pain    Left Hip ABduction  4/5    Right/Left Knee  Right;Left    Right Knee Flexion  4/5 limited by pain in back    Left Knee Flexion   4/5 limited by pain in back      Balance   Balance Assessed  Yes      Static Standing Balance   Static Standing - Balance Support  No upper extremity supported    Static Standing - Level of Assistance  7: Independent    Static Standing Balance -  Activities   Single Leg Stance - Right Leg;Single Leg Stance - Left Leg;Tandam Stance - Right Leg;Tandam Stance - Left Leg    Static Standing - Comment/# of Minutes  SLS Lt 27", Rt 2" limited by pain; Tandem stance Rt 20", Lt 33"                   [facility] Adult PT Treatment/Exercise - 11/23/17 0001      Lumbar Exercises: Stretches   Standing Side Bend  Right;Left 10x each    Standing Extension  10 reps;5 seconds    Other Lumbar Stretch Exercise  standing rotation 10x    Other Lumbar Stretch Exercise  child's pose 3x 30"      Lumbar Exercises: Standing   Functional Squats  10 reps mat tapping and verbal cueing for form      Lumbar Exercises: Supine   Pelvic Tilt  10 reps;5 seconds    Pelvic Tilt Limitations  cueing for abdominal contraction vs. gluteal rotation    Bridge  10 reps;3 seconds    Other Supine Lumbar Exercises  Decompression RTB 1-4 5x each (limited with some Rt UE movements)      Lumbar Exercises: Quadruped   Other Quadruped Lumbar Exercises  heel squeeze 10x 5"             PT Education - 11/23/17 0942    Education provided  Yes    Education Details  Educated on benefits of improved tolerance with HEP for strengthening and pain control;  Reviewed form with proper lifting, began squat with cueing required for form.    Person(s) Educated  Patient    Methods  Explanation;Demonstration    Comprehension  Verbalized understanding       PT Short Term Goals - 11/08/17 1230      PT SHORT TERM GOAL #1   Title  Patient will regularly and independently perform HEP.    Time  3    Period  Weeks    Status  New    Target Date  11/29/17      PT SHORT TERM GOAL #2   Title  Patient will be able to walk for 10  minutes consistently without increases in pain.    Baseline  2 mins    Time  3    Period  Weeks      PT SHORT TERM GOAL #3   Title  Patient will be able to stand for 10 minutes consistently without increases in pain.    Baseline  2 mins    Time  3    Period  Weeks    Status  New      PT SHORT TERM GOAL #4   Title  Patient will be able to lift a 5# object off the floor exhibiting good body mechanics and no increases in pain.    Time  3    Period  Weeks    Status  New        PT Long Term Goals - 11/08/17 1233      PT LONG TERM GOAL #1   Title  Patient will regularly and independently perform advanced HEP.    Time  6    Period  Weeks    Status  New    Target Date  12/20/17      PT LONG TERM GOAL #2   Title  Patient will be able to walk for 20 minutes consistently without increases in pain to improve his ability to perform his work duties.    Time  6    Period  Weeks    Status  New      PT LONG TERM GOAL #3   Title  Patient will be able to stand for 20 minutes consistently without increases in pain to improve his ability to perform his work duties.    Time  6    Period  Weeks    Status  New            Plan - 11/23/17 0947    Clinical Impression Statement  Pt continues to be limited by pain, reports plans to receive shot tomorrow and plans for surgery 12/19/17.  Continued session focus with lumbar mobility and core/proximal strengthening for pain control assistance.  MMT complete with deficits noted especially glutuel musculature.  Pt explained benefits of increased compliance with HEP for strengthening to assist with pain.  Added squats for gluteal strengthening, progress to proper lifting when mechanics improve per STGs.  Cueing and explaination for mechanics needed.  Continued with core strengthening to assist with LBP, reports pain reduced to 5-56/10 at EOS.      Rehab Potential  Fair    Clinical Impairments Affecting Rehab Potential  negative - chronicity of  condition, amplitude pain increases with too much activity; positive - motivated, pleasant    PT Frequency  1x / week    PT Duration  6 weeks    PT Treatment/Interventions  ADLs/Self Care Home Management;Cryotherapy;Electrical Stimulation;Ultrasound;Traction;Moist Heat;Iontophoresis 4mg /ml Dexamethasone;Gait training;Stair training;Functional mobility training;Therapeutic activities;Therapeutic exercise;Balance training;Neuromuscular re-education;Patient/family education;Manual techniques;Scar mobilization;Passive range of motion;Dry needling;Energy conservation    PT Next Visit Plan  Continue progressing abdominal/gluteal strengthening.  If continued positive response (decreased pain and centralization of sx) to extension exercises and level 1 stab exercises, add to encourage abdominal strength and spinal mobility.    PT Home Exercise Plan  11/14/2017 - ab set, pelvic tilt, supine clams, McKenzie ex 1-3 progression (prone 3 min, POE 3 min, prone pressup x8, prone 3 min, POE 3 min); 11/21/17; decompression  exercise 1-5       Patient will benefit from skilled therapeutic intervention in order to improve the following deficits and impairments:  Pain, Decreased mobility, Postural dysfunction, Decreased activity tolerance, Decreased range of motion, Difficulty walking  Visit Diagnosis: Difficulty in walking, not elsewhere classified  Chronic right-sided low back pain with right-sided sciatica  Pain in right thigh     Problem List Patient Active Problem List   Diagnosis Date Noted  . Essential hypertension 01/23/2013  . Coronary atherosclerosis of native coronary artery 01/23/2013  . Mixed hyperlipidemia 01/23/2013   Ihor Austin, [place]; [place] Aldona Lento 11/23/2017, 9:53 AM  [place], [place], [address] Phone: [phone]   Fax:  [phone]  Name: Samuel Gregory MRN: [id number] Date of Birth:  05-Sep-1940

## 2017-11-23 NOTE — Telephone Encounter (Signed)
90 day refill ramipril refilled

## 2017-11-24 ENCOUNTER — Ambulatory Visit
Admission: RE | Admit: 2017-11-24 | Discharge: 2017-11-24 | Disposition: A | Discharge status: Home / Self Care | Payer: Medicare Other | Source: Ambulatory Visit | Attending: Internal Medicine | Admitting: Internal Medicine

## 2017-11-24 DIAGNOSIS — G8929 Other chronic pain: Secondary | ICD-10-CM

## 2017-11-24 DIAGNOSIS — M5416 Radiculopathy, lumbar region: Secondary | ICD-10-CM | POA: Diagnosis not present

## 2017-11-24 DIAGNOSIS — M545 Low back pain: Principal | ICD-10-CM

## 2017-11-24 MED ORDER — METHYLPREDNISOLONE ACETATE 40 MG/ML INJ SUSP (RADIOLOG
120.0000 mg | Freq: Once | INTRAMUSCULAR | Status: AC
  Administered 2017-11-24: 120 mg via EPIDURAL

## 2017-11-24 MED ORDER — IOPAMIDOL (ISOVUE-M 200) INJECTION 41%
1.0000 mL | Freq: Once | INTRAMUSCULAR | Status: AC
  Administered 2017-11-24: 1 mL via EPIDURAL

## 2017-11-24 NOTE — Discharge Instructions (Signed)

## 2017-11-28 ENCOUNTER — Encounter (HOSPITAL_COMMUNITY): Payer: Self-pay

## 2017-11-28 ENCOUNTER — Ambulatory Visit (HOSPITAL_COMMUNITY): Payer: Medicare Other

## 2017-11-28 DIAGNOSIS — M5441 Lumbago with sciatica, right side: Secondary | ICD-10-CM | POA: Diagnosis not present

## 2017-11-28 DIAGNOSIS — G8929 Other chronic pain: Secondary | ICD-10-CM | POA: Diagnosis not present

## 2017-11-28 DIAGNOSIS — M79651 Pain in right thigh: Secondary | ICD-10-CM

## 2017-11-28 DIAGNOSIS — R262 Difficulty in walking, not elsewhere classified: Secondary | ICD-10-CM

## 2017-11-28 NOTE — Therapy (Signed)
[place], [place], [address] Phone: [phone]   Fax:  [phone]  Physical Therapy Treatment  Patient Details  Name: Samuel Gregory MRN: [id number] Date of Birth: February 20, 1941 Referring Provider: Marzetta Board   Encounter Date: 11/28/2017  PT End of Session - 11/28/17 0923    Visit Number  7    Number of Visits  12    Date for PT Re-Evaluation  12/20/17 Minireassess 11/29/17    Authorization Type  Medicare Part A and BCBS Supplement; mini reassess 11/29/17, Reeval 12/20/17    Authorization Time Period  cert: 8/85-->0/27/74    PT Start Time  0909 pt late for apt    PT Stop Time  0944    PT Time Calculation (min)  35 min    Activity Tolerance  Patient tolerated treatment well;No increased pain pain scale 4/10 through session    Behavior During Therapy  [facility] for tasks assessed/performed       Past Medical History:  Diagnosis Date  . Arthritis   . Collagen vascular disease ([place])   . Coronary atherosclerosis of native coronary artery    Multivessel status post CABG 2000  . Essential hypertension, benign   . Hyperlipidemia   . Prostate cancer [facility])     Past Surgical History:  Procedure Laterality Date  . APPENDECTOMY     8 yrs ago- Dr Geroge Baseman  . CATARACT EXTRACTION W/PHACO Left 07/14/2014   Procedure: CATARACT EXTRACTION PHACO AND INTRAOCULAR LENS PLACEMENT (IOC);  Surgeon: Tonny Branch, MD;  Location: [facility];  Service: Ophthalmology;  Laterality: Left;  CDE:6.20  . CATARACT EXTRACTION W/PHACO Right 08/11/2014   Procedure: CATARACT EXTRACTION PHACO AND INTRAOCULAR LENS PLACEMENT RIGHT EYE;  Surgeon: Tonny Branch, MD;  Location: [facility];  Service: Ophthalmology;  Laterality: Right;  CDE:10.11  . COLONOSCOPY N/A 01/14/2015   Procedure: COLONOSCOPY;  Surgeon: Rogene Houston, MD;  Location: [facility] ENDO SU[facility];  Service: Endoscopy;  Laterality: N/A;  730  . CORONARY ARTERY BYPASS GRAFT  01/1999   LIMA to LAD, SVG to  diagonal, sequential SVG to PDA and PLB  . [place]CT[place]Y  2004   [name]  . TONSILLECTOMY     age 30    There were no vitals filed for this visit.  Subjective Assessment - 11/28/17 0914    Subjective  Pt stated he had a shot on Friday and feels better, current pain scale 4/10 Rt side of lower back.      Pertinent History  prostate cancer, 5 heart stents, lumbar stenosis with bil leg pain    Patient Stated Goals  to get ready for surgery    Currently in Pain?  Yes    Pain Score  4     Pain Location  Back    Pain Orientation  Lower    Pain Type  Chronic pain    Pain Radiating Towards  Rt hip tingling pain    Pain Onset  More than a month ago    Pain Frequency  Intermittent    Aggravating Factors   standing, walking, lifting, physical activity requiring movement    Pain Relieving Factors  sitting laying down     Effect of Pain on Daily Activities  pain can limit activity several days after a more active day                       [facility] Adult PT Treatment/Exercise - 11/28/17 0001  Lumbar Exercises: Stretches   Standing Side Bend  Right;Left 10x each    Other Lumbar Stretch Exercise  child's pose 2x 30"; 1 set x 30" Rt and Lt      Lumbar Exercises: Standing   Functional Squats  10 reps tapping mat originally with HHA then no HHA 5x    Forward Lunge  10 reps 7in step height    Other Standing Lumbar Exercises  SLS Rt 11", Lt 24" max of 3      Lumbar Exercises: Supine   Bent Knee Raise  10 reps;5 seconds;Limitations    Bent Knee Raise Limitations  purple theraband    Bridge  10 reps;3 seconds      Lumbar Exercises: Sidelying   Clam  Both;10 reps;5 seconds RTB               PT Short Term Goals - 11/08/17 1230      PT SHORT TERM GOAL #1   Title  Patient will regularly and independently perform HEP.    Time  3    Period  Weeks    Status  New    Target Date  11/29/17      PT SHORT TERM GOAL #2   Title  Patient will be able to walk for 10  minutes consistently without increases in pain.    Baseline  2 mins    Time  3    Period  Weeks      PT SHORT TERM GOAL #3   Title  Patient will be able to stand for 10 minutes consistently without increases in pain.    Baseline  2 mins    Time  3    Period  Weeks    Status  New      PT SHORT TERM GOAL #4   Title  Patient will be able to lift a 5# object off the floor exhibiting good body mechanics and no increases in pain.    Time  3    Period  Weeks    Status  New        PT Long Term Goals - 11/08/17 1233      PT LONG TERM GOAL #1   Title  Patient will regularly and independently perform advanced HEP.    Time  6    Period  Weeks    Status  New    Target Date  12/20/17      PT LONG TERM GOAL #2   Title  Patient will be able to walk for 20 minutes consistently without increases in pain to improve his ability to perform his work duties.    Time  6    Period  Weeks    Status  New      PT LONG TERM GOAL #3   Title  Patient will be able to stand for 20 minutes consistently without increases in pain to improve his ability to perform his work duties.    Time  6    Period  Weeks    Status  New            Plan - 11/28/17 6767    Clinical Impression Statement  Pt with improved tolerance for treatment following reports of shot last week.  Continued session focus with lumbar mobility and core/gluteal strengthening.  Progressed to [facility] for gluteal strengthening with good from noted wiht squats as well as forward lunges following initial instruction.   No reports of increased pain through session, noted  instability with SLS.    Rehab Potential  Fair    Clinical Impairments Affecting Rehab Potential  negative - chronicity of condition, amplitude pain increases with too much activity; positive - motivated, pleasant    PT Frequency  1x / week    PT Duration  6 weeks    PT Treatment/Interventions  ADLs/Self Care Home Management;Cryotherapy;Electrical  Stimulation;Ultrasound;Traction;Moist Heat;Iontophoresis 4mg /ml Dexamethasone;Gait training;Stair training;Functional mobility training;Therapeutic activities;Therapeutic exercise;Balance training;Neuromuscular re-education;Patient/family education;Manual techniques;Scar mobilization;Passive range of motion;Dry needling;Energy conservation    PT Next Visit Plan  Reassess next session.  Continue progressing abdominal/gluteal strengthening.  If continued positive response (decreased pain and centralization of sx) to extension exercises and level 1 stab exercises, add to encourage abdominal strength and spinal mobility.    PT Home Exercise Plan  11/14/2017 - ab set, pelvic tilt, supine clams, McKenzie ex 1-3 progression (prone 3 min, POE 3 min, prone pressup x8, prone 3 min, POE 3 min); 11/21/17; decompression exercise 1-5       Patient will benefit from skilled therapeutic intervention in order to improve the following deficits and impairments:  Pain, Decreased mobility, Postural dysfunction, Decreased activity tolerance, Decreased range of motion, Difficulty walking  Visit Diagnosis: Difficulty in walking, not elsewhere classified  Chronic right-sided low back pain with right-sided sciatica  Pain in right thigh     Problem List Patient Active Problem List   Diagnosis Date Noted  . Essential hypertension 01/23/2013  . Coronary atherosclerosis of native coronary artery 01/23/2013  . Mixed hyperlipidemia 01/23/2013   Ihor Austin, [place]; [place]  Aldona Lento 11/28/2017, 9:47 AM  [place], [place], [address] Phone: [phone]   Fax:  [phone]  Name: Samuel Gregory MRN: [id number] Date of Birth: 12-Jul-1941

## 2017-11-29 NOTE — Progress Notes (Signed)
Cardiology Office Note  Date: 12/01/2017   ID: Samuel Gregory, DOB 04-04-1941, MRN [id number]  PCP: Celene Squibb, MD  Primary Cardiologist: Rozann Lesches, MD   Chief Complaint  Patient presents with  . Coronary Artery Disease  . Preoperative evaluation    History of Present Illness: Samuel Gregory is a 77 y.o. male last seen in July 2018.  He is here with his wife today for follow-up. He is scheduled to undergo lumbar spine surgery due to lumbar stenosis with neurogenic claudication through the [facility] system later this month.  From a cardiac perspective he reports no angina symptoms on medical therapy, stable NYHA class II dyspnea, no palpitations or syncope.  Recent follow-up Lexiscan Myoview in April showed a region of inferior septal scar with mild to moderate peri-infarct ischemia and LVEF 62%.  Overall intermediate risk, but in the absence of progressive angina will continue medical therapy and observation.  Current cardiac regimen includes aspirin, Toprol-XL, Altace, and Crestor.  Past Medical History:  Diagnosis Date  . Arthritis   . Collagen vascular disease ([place])   . Coronary atherosclerosis of native coronary artery    Multivessel status post CABG 2000  . Essential hypertension, benign   . Hyperlipidemia   . Prostate cancer [facility])     Past Surgical History:  Procedure Laterality Date  . APPENDECTOMY     8 yrs ago- Dr Geroge Baseman  . CATARACT EXTRACTION W/PHACO Left 07/14/2014   Procedure: CATARACT EXTRACTION PHACO AND INTRAOCULAR LENS PLACEMENT (IOC);  Surgeon: Tonny Branch, MD;  Location: [facility];  Service: Ophthalmology;  Laterality: Left;  CDE:6.20  . CATARACT EXTRACTION W/PHACO Right 08/11/2014   Procedure: CATARACT EXTRACTION PHACO AND INTRAOCULAR LENS PLACEMENT RIGHT EYE;  Surgeon: Tonny Branch, MD;  Location: [facility] ORS;  Service: Ophthalmology;  Laterality: Right;  CDE:10.11  . COLONOSCOPY N/A 01/14/2015   Procedure: COLONOSCOPY;  Surgeon: Rogene Houston, MD;  Location: [facility]  ENDO SUITE;  Service: Endoscopy;  Laterality: N/A;  730  . CORONARY ARTERY BYPASS GRAFT  01/1999   LIMA to LAD, SVG to diagonal, sequential SVG to PDA and PLB  . PROST[place]CTOMY  2004   [name]  . TONSILLECTOMY     age 54    Current Outpatient Medications  Medication Sig Dispense Refill  . aspirin 325 MG tablet Take 325 mg by mouth daily.    . Calcium-Magnesium-Vitamin D (CALCIUM 1200+D3 PO) Take 1 tablet by mouth daily.     . fluticasone (FLONASE) 50 MCG/ACT nasal spray Place 1 spray into both nostrils daily as needed for allergies or rhinitis.    . metoprolol succinate (TOPROL-XL) 25 MG 24 hr tablet TAKE (1) TABLET BY MOUTH DAILY OR AS OTHERWISE DIRECTED. 90 tablet 0  . Multiple Vitamin (MULTIVITAMIN WITH MINERALS) TABS Take 1 tablet by mouth daily.    . naproxen sodium (ANAPROX) 220 MG tablet Take 220 mg by mouth daily as needed (pain).    . predniSONE (DELTASONE) 5 MG tablet Take 1 tablet by mouth 2 (two) times daily.  11  . ramipril (ALTACE) 5 MG capsule TAKE (1) CAPSULE BY MOUTH ONCE DAILY. 90 capsule 3  . rosuvastatin (CRESTOR) 20 MG tablet TAKE 1 TABLET BY MOUTH DAILY. 90 tablet 3  . Thiamine HCl (VITAMIN B-1 PO) Take 1 tablet by mouth daily.    Marland Kitchen ZYTIGA 250 MG tablet Take 4 tablets by mouth daily.  11   No current facility-administered medications for this visit.    Allergies:  Latex  and Sulfa antibiotics   Social History: The patient  reports that he has never smoked. He has never used smokeless tobacco. He reports that he drinks alcohol. He reports that he does not use drugs.   ROS:  Please see the history of present illness. Otherwise, complete review of systems is positive for chronic back pain.  All other systems are reviewed and negative.   Physical Exam: VS:  BP (!) 148/92   Pulse 69   Ht 5\' 6"  (1.676 m)   Wt 163 lb (73.9 kg)   SpO2 97%   BMI 26.31 kg/m , BMI Body mass index is 26.31 kg/m.  Wt Readings from Last 3 Encounters:  12/01/17 163 lb (73.9 kg)    08/24/17 160 lb (72.6 kg)  07/30/17 160 lb (72.6 kg)    General: Patient appears comfortable at rest. HEENT: Conjunctiva and lids normal, oropharynx clear. Neck: Supple, no elevated JVP or carotid bruits, no thyromegaly. Lungs: Clear to auscultation, nonlabored breathing at rest. Cardiac: Regular rate and rhythm, no S3 or significant systolic murmur, no pericardial rub. Abdomen: Soft, nontender, bowel sounds present, no guarding or rebound. Extremities: No pitting edema, distal pulses 2+. Skin: Warm and dry. Musculoskeletal: No kyphosis. Neuropsychiatric: Alert and oriented x3, affect grossly appropriate.  ECG: I personally reviewed the tracing from 01/11/2016 which showed normal sinus rhythm.  Recent Labwork:  May 2018: Hemoglobin 13.5, platelets 191, BUN 24, creatinine 1.1, potassium 4.4, AST 22, ALT 17, cholesterol 202, triglycerides 83, HDL 74, LDL 111  Other Studies Reviewed Today:  Lexiscan Myoview 11/07/2017:  No diagnostic ST segment changes to indicate ischemia.  Small, moderate intensity, inferior septal defect that is reversible at the apex and partially reversible in the mid to basal segment. This is consistent with scar with mild to moderate peri-infarct ischemia.  This is an intermediate risk study.  Nuclear stress EF: 62%.  Assessment and Plan:  1.  Preoperative evaluation in anticipation of lumbar spine surgery due to lumbar stenosis.  This is scheduled at [facility] later this month under general anesthesia.  Samuel Gregory has multivessel CAD status post CABG in 2000 with recent Myoview showing inferior septal scar with mild to moderate peri-infarct ischemia and LVEF 62%.  In the absence of angina symptoms on medical therapy he should be able to proceed with planned surgery at overall intermediate perioperative cardiac risk.  May hold aspirin as needed, but would otherwise continue baseline cardiac regimen.  2.  Mixed hyperlipidemia on Crestor.  He continues to follow with  Dr. [place] Crane.  3.  Essential hypertension, blood pressure mildly elevated today.  He reports compliance with his medications.  Would continue to follow with Dr. [place] Crane.  He does have room for further medication up titration if needed.  Current medicines were reviewed with the patient today.  Disposition: Follow-up in 1 year, sooner if needed.  Signed, Satira Sark, MD, [facility] 12/01/2017 10:50 AM    [place] [facility] [place], [place], [place] [address] Phone: [phone]; Fax: [phone]

## 2017-11-30 ENCOUNTER — Telehealth (HOSPITAL_COMMUNITY): Payer: Self-pay

## 2017-11-30 ENCOUNTER — Other Ambulatory Visit: Payer: Self-pay

## 2017-11-30 ENCOUNTER — Ambulatory Visit (HOSPITAL_COMMUNITY): Payer: Medicare Other | Attending: Orthopedic Surgery

## 2017-11-30 ENCOUNTER — Encounter (HOSPITAL_COMMUNITY): Payer: Self-pay

## 2017-11-30 DIAGNOSIS — M79651 Pain in right thigh: Secondary | ICD-10-CM | POA: Diagnosis not present

## 2017-11-30 DIAGNOSIS — G8929 Other chronic pain: Secondary | ICD-10-CM | POA: Diagnosis not present

## 2017-11-30 DIAGNOSIS — M5441 Lumbago with sciatica, right side: Secondary | ICD-10-CM | POA: Diagnosis not present

## 2017-11-30 DIAGNOSIS — R262 Difficulty in walking, not elsewhere classified: Secondary | ICD-10-CM | POA: Diagnosis not present

## 2017-11-30 NOTE — Therapy (Signed)
[facility] [place] [address] [place], [place], [address] Phone: [phone]   Fax:  [phone]  Physical Therapy Treatment/Progress Note  Patient Details  Name: Samuel Gregory MRN: [id number] Date of Birth: 01-11-1941 Referring Provider: Marzetta Board   Encounter Date: 11/30/2017   Progress Note Reporting Period 11/08/17 to 11/30/17  See note below for Objective Data and Assessment of Progress/Goals.     PT End of Session - 11/30/17 0942    Visit Number  8    Number of Visits  13    Date for PT Re-Evaluation  12/20/17 Minireassess 11/29/17    Authorization Type  Medicare Part A and BCBS Supplement; mini reassess 11/29/17, Reeval 12/20/17    Authorization Time Period  cert: 9/43-->2/76/14    PT Start Time  0910 patient arrived late    PT Stop Time  0944    PT Time Calculation (min)  34 min    Activity Tolerance  Patient tolerated treatment well;No increased pain pain scale 4/10 through session    Behavior During Therapy  [facility] for tasks assessed/performed       Past Medical History:  Diagnosis Date  . Arthritis   . Collagen vascular disease ([place])   . Coronary atherosclerosis of native coronary artery    Multivessel status post CABG 2000  . Essential hypertension, benign   . Hyperlipidemia   . Prostate cancer [facility])     Past Surgical History:  Procedure Laterality Date  . APPENDECTOMY     8 yrs ago- Dr Geroge Baseman  . CATARACT EXTRACTION W/PHACO Left 07/14/2014   Procedure: CATARACT EXTRACTION PHACO AND INTRAOCULAR LENS PLACEMENT (IOC);  Surgeon: Tonny Branch, MD;  Location: [facility];  Service: Ophthalmology;  Laterality: Left;  CDE:6.20  . CATARACT EXTRACTION W/PHACO Right 08/11/2014   Procedure: CATARACT EXTRACTION PHACO AND INTRAOCULAR LENS PLACEMENT RIGHT EYE;  Surgeon: Tonny Branch, MD;  Location: [facility] ORS;  Service: Ophthalmology;  Laterality: Right;  CDE:10.11  . COLONOSCOPY N/A 01/14/2015   Procedure: COLONOSCOPY;  Surgeon: Rogene Houston, MD;  Location: [facility] ENDO SUITE;  Service: Endoscopy;  Laterality: N/A;  730  . CORONARY ARTERY BYPASS GRAFT  01/1999   LIMA to LAD, SVG to diagonal, sequential SVG to PDA and PLB  . [place]Y  2004   [name]  . TONSILLECTOMY     age 77    There were no vitals filed for this visit.   Subjective Assessment - 11/30/17 0912    Subjective  Patient reports he is well today and his back pain has been a little better since he got the shot last week. He states his HEP is going well but he doesn't do his HEP on the days he comes here. He is going to three pre-operative appointments next week and is not sure he will be able to make the appointments next week for PT. He states he will reschedule at the front office.    Pertinent History  prostate cancer, 5 heart stents, lumbar stenosis with bil leg pain    Limitations  Lifting;Standing;Walking;House hold activities    How long can you sit comfortably?  not limited    How long can you stand comfortably?  sometimes 2 minutes; other times 30 minutes dependign on if it is a good day or bad day    How long can you walk comfortably?  sometimes walks throughout the day and doesn't have pain until the evening; other times he can only walk a few  minutes before pain increases    Diagnostic tests  xr, MRI    Patient Stated Goals  to get ready for surgery on 12/19/17    Currently in Pain?  Yes    Pain Score  4     Pain Location  Back    Pain Orientation  Lower    Pain Descriptors / Indicators  Aching;Sore    Pain Onset  More than a month ago    Pain Frequency  Intermittent    Aggravating Factors   standing walking lifting    Pain Relieving Factors  sitting and laying down         [facility] PT Assessment - 11/30/17 0001      Assessment   Medical Diagnosis  lumbar stenosis    Referring Provider  Marzetta Board    Onset Date/Surgical Date  10/31/17    Next MD Visit  12/06/17 - pre-op appointment    Prior Therapy  No       Observation/Other Assessments   Focus on Therapeutic Outcomes (FOTO)   58% limited was 56% limited on 11/08/17      ROM / Strength   AROM / PROM / Strength  AROM      AROM   Lumbar Flexion  60  no pain    Lumbar Extension  15 no pain    Lumbar - Right Side Bend  28 some pain    Lumbar - Left Side Bend  21 stretching    Lumbar - Right Rotation  10 stretching    Lumbar - Left Rotation  stretching      Strength   Strength Assessment Site  Hip;Knee;Ankle    Right Hip Flexion  4+/5    Right Hip Extension  4/5    Right Hip ABduction  4/5    Left Hip Flexion  4+/5    Left Hip Extension  4/5    Left Hip ABduction  4+/5    Right/Left Knee  Right;Left    Right Knee Flexion  4/5    Right Knee Extension  5/5    Left Knee Flexion  4+/5    Left Knee Extension  5/5    Right Ankle Dorsiflexion  4/5    Left Ankle Dorsiflexion  5/5        [facility] Adult PT Treatment/Exercise - 11/30/17 0001      Lumbar Exercises: Stretches   Single Knee to Chest Stretch  5 reps;Right;Left;20 seconds    Other Lumbar Stretch Exercise  child's pose 3x 30"; 1 set x 30" Rt and Lt      Lumbar Exercises: Standing   Functional Squats  15 reps;Limitations mat table taps    Functional Squats Limitations  2 sets, cues for chest over knees to increase hip flexion with neutral back during second set      Lumbar Exercises: Supine   Bent Knee Raise  10 reps;5 seconds;Limitations    Bent Knee Raise Limitations  Green TB; 2 sets    Bridge  10 reps;3 seconds;Limitations    Bridge Limitations  2 sets      Lumbar Exercises: Sidelying   Clam  Both;10 reps;5 seconds;Limitations    Clam Limitations  Green TB, 2 sets        PT Education - 11/30/17 1200    Education provided  Yes    Education Details  eudcated on progress towards goals and exercises throughout session.    Person(s) Educated  Patient    Methods  Explanation  Comprehension  Verbalized understanding;Returned demonstration       PT Short Term Goals -  11/30/17 0943      PT SHORT TERM GOAL #1   Title  Patient will regularly and independently perform HEP.    Baseline  11/30/17 - performing some exercises every day he is not in therapy    Time  3    Period  Weeks    Status  Achieved      PT SHORT TERM GOAL #2   Title  Patient will be able to walk for 10 minutes consistently without increases in pain.    Baseline  11/30/17- sometime 10 minutes sometimes a couple of minutes, sometimes walks throughout the day and doesn't have pain until the evening    Time  3    Period  Weeks    Status  Partially Met      PT SHORT TERM GOAL #3   Title  Patient will be able to stand for 10 minutes consistently without increases in pain.    Baseline  11/30/17 - stille uncomfortable, he can stand for 10 minutes but it will be painful    Time  3    Period  Weeks    Status  On-going      PT SHORT TERM GOAL #4   Title  Patient will be able to lift a 5# object off the floor exhibiting good body mechanics and no increases in pain.    Baseline  11/30/17 - improved squat mechanics to lift object 5#, no pain with squat repetition but not formally tested with weight today    Time  3    Period  Weeks    Status  Partially Met        PT Long Term Goals - 11/30/17 0955      PT LONG TERM GOAL #1   Title  Patient will regularly and independently perform advanced HEP.    Time  6    Period  Weeks    Status  On-going      PT LONG TERM GOAL #2   Title  Patient will be able to walk for 20 minutes consistently without increases in pain to improve his ability to perform his work duties.    Time  6    Period  Weeks    Status  On-going      PT LONG TERM GOAL #3   Title  Patient will be able to stand for 20 minutes consistently without increases in pain to improve his ability to perform his work duties.    Time  6    Period  Weeks    Status  On-going         Plan - 11/30/17 0917    Clinical Impression Statement  Re-assessment performed this session and patient  has met/partially met  short-term goals and is progressing towards long-term goals. He has made some improvements in LE strength and has improved form with functional squat for proper lifting mechanics. He reports walking and standing has improved some but his tolerance to these activities fluctuates from day to day. He reports he has had pain relief from the shot last Friday and that some of his exercises stretches continue to alleviate his pain. He will continue to benefit from skilled PT services to strengthen in preparation for his upcoming surgery on 12/19/17.    Rehab Potential  Fair    Clinical Impairments Affecting Rehab Potential  negative - chronicity of condition, amplitude pain  increases with too much activity; positive - motivated, pleasant    PT Frequency  1x / week    PT Duration  6 weeks    PT Treatment/Interventions  ADLs/Self Care Home Management;Cryotherapy;Electrical Stimulation;Ultrasound;Traction;Moist Heat;Iontophoresis 54m/ml Dexamethasone;Gait training;Stair training;Functional mobility training;Therapeutic activities;Therapeutic exercise;Balance training;Neuromuscular re-education;Patient/family education;Manual techniques;Scar mobilization;Passive range of motion;Dry needling;Energy conservation    PT Next Visit Plan  Continue progressing abdominal/gluteal strengthening. Begin introducing more standing functional strength as patients tolerance to standing/extension postures improves. Use childs pose to alleviate pain throughout session PRN.    PT Home Exercise Plan  11/14/2017 - ab set, pelvic tilt, supine clams, McKenzie ex 1-3 progression (prone 3 min, POE 3 min, prone pressup x8, prone 3 min, POE 3 min); 11/21/17; decompression exercise 1-5    Consulted and Agree with Plan of Care  Patient       Patient will benefit from skilled therapeutic intervention in order to improve the following deficits and impairments:  Pain, Decreased mobility, Postural dysfunction, Decreased  activity tolerance, Decreased range of motion, Difficulty walking  Visit Diagnosis: Difficulty in walking, not elsewhere classified  Chronic right-sided low back pain with right-sided sciatica  Pain in right thigh     Problem List Patient Active Problem List   Diagnosis Date Noted  . Essential hypertension 01/23/2013  . Coronary atherosclerosis of native coronary artery 01/23/2013  . Mixed hyperlipidemia 01/23/2013    RKipp Brood PT, DPT Physical Therap[facility] 11/30/2017 12:10 PM    [address] [address]Phone: [phone]  Fax:  [phone] Name: JKELTEN ENOCHSMRN: [id number] of Birth: [birth year]

## 2017-11-30 NOTE — Telephone Encounter (Signed)
Patient will be going out of town and canceled both appts for next week

## 2017-12-01 ENCOUNTER — Encounter: Payer: Self-pay | Admitting: Cardiology

## 2017-12-01 ENCOUNTER — Other Ambulatory Visit: Payer: Self-pay

## 2017-12-01 ENCOUNTER — Ambulatory Visit (INDEPENDENT_AMBULATORY_CARE_PROVIDER_SITE_OTHER): Payer: Medicare Other | Admitting: Cardiology

## 2017-12-01 VITALS — BP 148/92 | HR 69 | Ht 66.0 in | Wt 163.0 lb

## 2017-12-01 DIAGNOSIS — Z01810 Encounter for preprocedural cardiovascular examination: Secondary | ICD-10-CM | POA: Diagnosis not present

## 2017-12-01 DIAGNOSIS — I10 Essential (primary) hypertension: Secondary | ICD-10-CM

## 2017-12-01 DIAGNOSIS — I2510 Atherosclerotic heart disease of native coronary artery without angina pectoris: Secondary | ICD-10-CM

## 2017-12-01 DIAGNOSIS — E782 Mixed hyperlipidemia: Secondary | ICD-10-CM

## 2017-12-01 NOTE — Patient Instructions (Addendum)

## 2017-12-05 ENCOUNTER — Ambulatory Visit (HOSPITAL_COMMUNITY): Payer: Medicare Other

## 2017-12-06 DIAGNOSIS — C7951 Secondary malignant neoplasm of bone: Secondary | ICD-10-CM | POA: Diagnosis not present

## 2017-12-06 DIAGNOSIS — M48062 Spinal stenosis, lumbar region with neurogenic claudication: Secondary | ICD-10-CM | POA: Diagnosis not present

## 2017-12-06 DIAGNOSIS — M48061 Spinal stenosis, lumbar region without neurogenic claudication: Secondary | ICD-10-CM | POA: Diagnosis not present

## 2017-12-06 DIAGNOSIS — C61 Malignant neoplasm of prostate: Secondary | ICD-10-CM | POA: Diagnosis not present

## 2017-12-06 DIAGNOSIS — Z01818 Encounter for other preprocedural examination: Secondary | ICD-10-CM | POA: Diagnosis not present

## 2017-12-06 DIAGNOSIS — I10 Essential (primary) hypertension: Secondary | ICD-10-CM | POA: Diagnosis not present

## 2017-12-07 ENCOUNTER — Encounter (HOSPITAL_COMMUNITY): Payer: Medicare Other

## 2017-12-11 ENCOUNTER — Telehealth (HOSPITAL_COMMUNITY): Payer: Self-pay

## 2017-12-11 ENCOUNTER — Encounter (HOSPITAL_COMMUNITY): Payer: Self-pay

## 2017-12-11 NOTE — Telephone Encounter (Signed)
Patient wants to put therapy on hold for now. He is having surgery on the 21st and feels he may have [place] after that since he wont be moblie for 6 weeks. aftre that he will see what the plan is.

## 2017-12-11 NOTE — Therapy (Signed)
[place], [place], [address] Phone: [phone]   Fax:  [phone]  Patient Details  Name: Samuel Gregory MRN: [id number] Date of Birth: 02-Feb-1941 Referring Provider:  No ref. provider found  Encounter Date: 12/11/2017   PHYSICAL THERAPY DISCHARGE SUMMARY  Visits from Start of Care: 8  Current functional level related to goals / functional outcomes: Patient is planning to have surgery and cancelled his last appointments. He will be discharged and can return with a new referral after his surgery.   Remaining deficits: Objective testing and clinical impression from last visit: 11/30/17   [facility] PT Assessment - 11/30/17 0001            Assessment   Medical Diagnosis  lumbar stenosis    Referring Provider  Marzetta Board    Onset Date/Surgical Date  10/31/17    Next MD Visit  12/06/17 - pre-op appointment    Prior Therapy  No        Observation/Other Assessments   Focus on Therapeutic Outcomes (FOTO)   58% limited was 56% limited on 11/08/17        ROM / Strength   AROM / PROM / Strength  AROM        AROM   Lumbar Flexion  60  no pain    Lumbar Extension  15 no pain    Lumbar - Right Side Bend  28 some pain    Lumbar - Left Side Bend  21 stretching    Lumbar - Right Rotation  10 stretching    Lumbar - Left Rotation  stretching        Strength   Strength Assessment Site  Hip;Knee;Ankle    Right Hip Flexion  4+/5    Right Hip Extension  4/5    Right Hip ABduction  4/5    Left Hip Flexion  4+/5    Left Hip Extension  4/5    Left Hip ABduction  4+/5    Right/Left Knee  Right;Left    Right Knee Flexion  4/5    Right Knee Extension  5/5    Left Knee Flexion  4+/5    Left Knee Extension  5/5    Right Ankle Dorsiflexion  4/5    Left Ankle Dorsiflexion  5/5     Plan - 11/30/17 0917    Clinical Impression Statement     Re-assessment performed this session  and patient has met/partially met  short-term goals and is progressing towards long-term goals. He has made some improvements in LE strength and has improved form with functional squat for proper lifting mechanics. He reports walking and standing has improved some but his tolerance to these activities fluctuates from day to day. He reports he has had pain relief from the shot last Friday and that some of his exercises stretches continue to alleviate his pain. He will continue to benefit from skilled PT services to strengthen in preparation for his upcoming surgery on     Education / Equipment: Educated on HEP throughout duration of therapy.  Plan: Patient agrees to discharge.  Patient goals were not met. Patient is being discharged due to the patient's request.  ?????     Kipp Brood, PT, DPT Physical Therapist with [facility]  12/11/2017 2:13 PM    [place], [place], [address] Phone: [phone]   Fax:  [phone]

## 2017-12-12 ENCOUNTER — Ambulatory Visit (HOSPITAL_COMMUNITY): Payer: Medicare Other

## 2017-12-14 ENCOUNTER — Encounter (HOSPITAL_COMMUNITY): Payer: Medicare Other

## 2017-12-19 DIAGNOSIS — M4327 Fusion of spine, lumbosacral region: Secondary | ICD-10-CM | POA: Insufficient documentation

## 2017-12-19 DIAGNOSIS — M48062 Spinal stenosis, lumbar region with neurogenic claudication: Secondary | ICD-10-CM | POA: Diagnosis not present

## 2017-12-19 DIAGNOSIS — Z9079 Acquired absence of other genital organ(s): Secondary | ICD-10-CM | POA: Diagnosis not present

## 2017-12-19 DIAGNOSIS — Z8546 Personal history of malignant neoplasm of prostate: Secondary | ICD-10-CM | POA: Diagnosis not present

## 2017-12-19 DIAGNOSIS — M5416 Radiculopathy, lumbar region: Secondary | ICD-10-CM | POA: Diagnosis not present

## 2017-12-19 DIAGNOSIS — Z91040 Latex allergy status: Secondary | ICD-10-CM | POA: Diagnosis not present

## 2017-12-19 DIAGNOSIS — Z7982 Long term (current) use of aspirin: Secondary | ICD-10-CM | POA: Diagnosis not present

## 2017-12-19 DIAGNOSIS — E785 Hyperlipidemia, unspecified: Secondary | ICD-10-CM | POA: Diagnosis present

## 2017-12-19 DIAGNOSIS — E291 Testicular hypofunction: Secondary | ICD-10-CM | POA: Diagnosis present

## 2017-12-19 DIAGNOSIS — I10 Essential (primary) hypertension: Secondary | ICD-10-CM | POA: Diagnosis present

## 2017-12-19 DIAGNOSIS — Z882 Allergy status to sulfonamides status: Secondary | ICD-10-CM | POA: Diagnosis not present

## 2017-12-19 DIAGNOSIS — M4186 Other forms of scoliosis, lumbar region: Secondary | ICD-10-CM | POA: Diagnosis not present

## 2017-12-19 DIAGNOSIS — M4807 Spinal stenosis, lumbosacral region: Secondary | ICD-10-CM | POA: Diagnosis not present

## 2017-12-19 DIAGNOSIS — C7951 Secondary malignant neoplasm of bone: Secondary | ICD-10-CM | POA: Diagnosis present

## 2017-12-19 DIAGNOSIS — Z951 Presence of aortocoronary bypass graft: Secondary | ICD-10-CM | POA: Diagnosis not present

## 2017-12-22 DIAGNOSIS — E875 Hyperkalemia: Secondary | ICD-10-CM | POA: Diagnosis not present

## 2018-01-11 DIAGNOSIS — M48061 Spinal stenosis, lumbar region without neurogenic claudication: Secondary | ICD-10-CM | POA: Diagnosis not present

## 2018-01-11 DIAGNOSIS — Z8546 Personal history of malignant neoplasm of prostate: Secondary | ICD-10-CM | POA: Diagnosis not present

## 2018-01-11 DIAGNOSIS — M5416 Radiculopathy, lumbar region: Secondary | ICD-10-CM | POA: Diagnosis not present

## 2018-01-11 DIAGNOSIS — Z6825 Body mass index (BMI) 25.0-25.9, adult: Secondary | ICD-10-CM | POA: Diagnosis not present

## 2018-01-11 DIAGNOSIS — I2510 Atherosclerotic heart disease of native coronary artery without angina pectoris: Secondary | ICD-10-CM | POA: Diagnosis not present

## 2018-01-11 DIAGNOSIS — E875 Hyperkalemia: Secondary | ICD-10-CM | POA: Diagnosis not present

## 2018-01-31 DIAGNOSIS — M48062 Spinal stenosis, lumbar region with neurogenic claudication: Secondary | ICD-10-CM | POA: Diagnosis not present

## 2018-02-09 DIAGNOSIS — Z961 Presence of intraocular lens: Secondary | ICD-10-CM | POA: Diagnosis not present

## 2018-02-09 DIAGNOSIS — Z9841 Cataract extraction status, right eye: Secondary | ICD-10-CM | POA: Diagnosis not present

## 2018-02-09 DIAGNOSIS — H43811 Vitreous degeneration, right eye: Secondary | ICD-10-CM | POA: Diagnosis not present

## 2018-02-09 DIAGNOSIS — H401122 Primary open-angle glaucoma, left eye, moderate stage: Secondary | ICD-10-CM | POA: Diagnosis not present

## 2018-02-13 ENCOUNTER — Ambulatory Visit (HOSPITAL_COMMUNITY): Payer: Medicare Other | Attending: Orthopedic Surgery | Admitting: Physical Therapy

## 2018-02-13 ENCOUNTER — Other Ambulatory Visit: Payer: Self-pay

## 2018-02-13 ENCOUNTER — Encounter (HOSPITAL_COMMUNITY): Payer: Self-pay | Admitting: Physical Therapy

## 2018-02-13 DIAGNOSIS — G8929 Other chronic pain: Secondary | ICD-10-CM | POA: Insufficient documentation

## 2018-02-13 DIAGNOSIS — M79651 Pain in right thigh: Secondary | ICD-10-CM | POA: Diagnosis not present

## 2018-02-13 DIAGNOSIS — R262 Difficulty in walking, not elsewhere classified: Secondary | ICD-10-CM | POA: Diagnosis not present

## 2018-02-13 DIAGNOSIS — M5441 Lumbago with sciatica, right side: Secondary | ICD-10-CM | POA: Diagnosis not present

## 2018-02-13 DIAGNOSIS — M5416 Radiculopathy, lumbar region: Secondary | ICD-10-CM | POA: Insufficient documentation

## 2018-02-13 NOTE — Patient Instructions (Addendum)
Flexibility: Neck Retraction    Pull head straight back, keeping eyes and jaw level. Repeat ____ times per set. Do ____ sets per session. Do ____ sessions per day.  http://orth.exer.us/344   Copyright  VHI. All rights reserved.  Scapular Retraction (Standing)    With arms at sides, pinch shoulder blades together. Repeat ____ times per set. Do ____ sets per session. Do ____ sessions per day.  http://orth.exer.us/944   Copyright  VHI. All rights reserved.  Isometric Abdominal   May do sitting or standing  Lying on back with knees bent, tighten stomach by pressing elbows down. Hold __5__ seconds. Repeat ___10_ times per set. Do __1__ sets per session. Do __5__ sessions per day.  http://orth.exer.us/1086   Copyright  VHI. All rights reserved.  Toe Raise (Sitting)    Raise toes, keeping heels on floor. Hold 5-10 seconds to 10 x  Repeat _10___ times per set. Do __1__ sets per session. Do __3__ sessions per day.  http://orth.exer.us/46   Copyright  VHI. All rights reserved.

## 2018-02-13 NOTE — Therapy (Signed)
[place], [place], [address] Phone: [phone]   Fax:  [phone]  Physical Therapy Evaluation  Patient Details  Name: Samuel Gregory MRN: [id number] Date of Birth: 1940/09/21 Referring Provider: Myrtis Ser    Encounter Date: 02/13/2018  PT End of Session - 02/13/18 0954    Visit Number  1    Number of Visits  12    Date for PT Re-Evaluation  03/29/18 mini reassess visit 8-10    PT Start Time  0900    PT Stop Time  0945    PT Time Calculation (min)  45 min    Activity Tolerance  Patient tolerated treatment well;No increased pain pain scale 4/10 through session    Behavior During Therapy  [facility] for tasks assessed/performed       Past Medical History:  Diagnosis Date  . Arthritis   . Collagen vascular disease ([place])   . Coronary atherosclerosis of native coronary artery    Multivessel status post CABG 2000  . Essential hypertension, benign   . Hyperlipidemia   . Prostate cancer [facility])     Past Surgical History:  Procedure Laterality Date  . APPENDECTOMY     8 yrs ago- Dr Geroge Baseman  . CATARACT EXTRACTION W/PHACO Left 07/14/2014   Procedure: CATARACT EXTRACTION PHACO AND INTRAOCULAR LENS PLACEMENT (IOC);  Surgeon: Tonny Branch, MD;  Location: [facility];  Service: Ophthalmology;  Laterality: Left;  CDE:6.20  . CATARACT EXTRACTION W/PHACO Right 08/11/2014   Procedure: CATARACT EXTRACTION PHACO AND INTRAOCULAR LENS PLACEMENT RIGHT EYE;  Surgeon: Tonny Branch, MD;  Location: [facility];  Service: Ophthalmology;  Laterality: Right;  CDE:10.11  . COLONOSCOPY N/A 01/14/2015   Procedure: COLONOSCOPY;  Surgeon: Rogene Houston, MD;  Location: [facility];  Service: Endoscopy;  Laterality: N/A;  730  . CORONARY ARTERY BYPASS GRAFT  01/1999   LIMA to LAD, SVG to diagonal, sequential SVG to PDA and PLB  . [place]OMY  2004   [name]  . TONSILLECTOMY     age 77    There were no vitals filed for this  visit.   Subjective Assessment - 02/13/18 0851    Subjective  Patient reports that he had his surgery on 12/19/17.   At this time he would say that he is doing better since the surgery.   He states that he still feels weak in his legs and he is still having nerve pain into his legs     Pertinent History  prostate cancer, 5 heart stents, lumbar stenosis with bil leg pain    Limitations  Lifting;Standing;Walking;House hold activities    How long can you sit comfortably?  an hour     How long can you stand comfortably?  15-20 minutes     How long can you walk comfortably?  Able to walk for about 30 minutes     Diagnostic tests  xr, MRI    Patient Stated Goals  to get ready for surgery on 12/19/17    Currently in Pain?  Yes    Pain Score  3     Pain Location  Back    Pain Orientation  Lower    Pain Descriptors / Indicators  Sore    Pain Type  Acute pain    Pain Radiating Towards   B legs     Pain Onset  More than a month ago    Pain Frequency  Constant    Aggravating Factors  not sure    Pain Relieving Factors  time    Effect of Pain on Daily Activities  limits         [facility] PT Assessment - 02/13/18 0001      Assessment   Medical Diagnosis  lumbar stenosis    Referring Provider  Myrtis Ser     Onset Date/Surgical Date  12/19/17    Next MD Visit  12/06/17 - pre-op appointment    Prior Therapy  yes      Precautions   Precautions  None      Restrictions   Weight Bearing Restrictions  No      Balance Screen   Has the patient fallen in the past 6 months  No    Has the patient had a decrease in activity level because of a fear of falling?   No    Is the patient reluctant to leave their home because of a fear of falling?   No      Prior Function   Level of Independence  Independent    Vocation  Part time employment    Programmer, [employer] [place]   Overall Cognitive Status  Within Functional Limits for tasks assessed       Observation/Other Assessments   Focus on Therapeutic Outcomes (FOTO)   44      AROM   Lumbar Flexion  --    Lumbar Extension  16    Lumbar - Right Side Bend  15    Lumbar - Left Side Bend  18    Lumbar - Right Rotation  --    Lumbar - Left Rotation  --      Strength   Right Hip Flexion  5/5    Right Hip Extension  4/5    Right Hip ABduction  4/5    Left Hip Flexion  4/5    Left Hip Extension  3+/5    Left Hip ABduction  4+/5    Right Knee Flexion  4+/5    Right Knee Extension  5/5    Left Knee Flexion  5/5    Left Knee Extension  5/5    Right Ankle Dorsiflexion  3+/5    Left Ankle Dorsiflexion  5/5      Static Standing Balance   Static Standing Balance -  Activities   Single Leg Stance - Right Leg;Single Leg Stance - Left Leg;Tandam Stance - Right Leg;Tandam Stance - Left Leg    Static Standing - Comment/# of Minutes  Rt: 5  LT: 20"                Objective measurements completed on examination: See above findings.      [place] Adult PT Treatment/Exercise - 02/13/18 0001      Exercises   Exercises  Lumbar      Lumbar Exercises: Seated   Other Seated Lumbar Exercises  sitting tall, ab set, scapular retraction, cervical retraction all x 5              PT Education - 02/13/18 0953    Education provided  Yes    Education Details  bed mobility mechanics     Person(s) Educated  Patient    Methods  Explanation;Demonstration    Comprehension  Verbalized understanding;Returned demonstration       PT Short Term Goals - 02/13/18 1126      PT SHORT TERM GOAL #1   Title  Pt to demonstrate and be able to verbalize the importance of good posture to decrease stress of pt low back to prevent reinjury.    Time  3    Period  Weeks    Status  New    Target Date  03/06/18      PT SHORT TERM GOAL #2   Title  Pt to be able to demonstrate good body mechanics for lifting and bed mobility to decrease stress of pt low back to prevent reinjury    Time  3    Period   Weeks    Status  New      PT SHORT TERM GOAL #3   Title  Pt pain level in his back to be no greater than a 2/10 to be able to walk a 20 minutes mile.     Time  3    Period  Weeks        PT Long Term Goals - 02/13/18 1132      PT LONG TERM GOAL #1   Title  Pt B LE strength to increase to at least 5-/5 to allow pt to go up and down 2 flights of steps with to pain or difficulty     Time  6    Period  Weeks    Status  New    Target Date  03/27/18      PT LONG TERM GOAL #2   Title  PT to be able to walk 2 miles in 35 miutes to return to pre surgery work out routine.     Time  6    Period  Weeks    Status  New      PT LONG TERM GOAL #3   Title  PT to be able to return to work with no increased back pain.     Time  6    Period  Weeks    Status  New      PT LONG TERM GOAL #4   Title  PT to be able to single leg stance for 30 seconds bilaterally to demonstrate improved core strength and reduce risk of falling     Time  6    Period  Weeks    Status  New             Plan - 02/13/18 0956    Clinical Impression Statement  Mr. Resende states that he opted to have lumbar surgery in May.  He has returned to his MD and all restrictions have been lifted.  He is now having difficulty with weakness in his legs and has been referred to skilled physical therapy for a lumbar stabilization and core strengthening.  Evaluation demonstrates decreased activity tolerance, decreased balance, decreased knowledge of proper body mechanics, decreased strength and increased pain.  Mr. Cedillos will benefit from skilled physical therapy to address these issues.     Clinical Presentation  Stable    Rehab Potential  Good    PT Frequency  2x / week    PT Duration  6 weeks    PT Treatment/Interventions  ADLs/Self Care Home Management;Cryotherapy;Gait training;Stair training;Functional mobility training;Therapeutic activities;Therapeutic exercise;Balance training;Neuromuscular re-education;Patient/family  education;Manual techniques;Scar mobilization    PT Next Visit Plan  begin lumbar stabilization program to include bent knee lift, clam bridging, SLR and hip abduction; progress to prone, quadriped and standing     PT Home Exercise Plan  sitting tall holding x 10 seconds/ RT Dorisflexion, scapular retraction; cervical retraction  Consulted and Agree with Plan of Care  Patient       Patient will benefit from skilled therapeutic intervention in order to improve the following deficits and impairments:  Pain, Decreased mobility, Postural dysfunction, Decreased activity tolerance, Decreased range of motion, Difficulty walking, Decreased balance, Decreased strength, Improper body mechanics, Impaired flexibility  Visit Diagnosis: Difficulty in walking, not elsewhere classified  Radiculopathy, lumbar region     Problem List Patient Active Problem List   Diagnosis Date Noted  . Essential hypertension 01/23/2013  . Coronary atherosclerosis of native coronary artery 01/23/2013  . Mixed hyperlipidemia 01/23/2013   Rayetta Humphrey, PT CLT [phone] 02/13/2018, 11:38 AM  [place] [address] [place], [place], [address] Phone: [phone]   Fax:  [phone]  Name: Samuel Gregory MRN: [id number] Date of Birth: [birth year]

## 2018-02-15 ENCOUNTER — Ambulatory Visit (HOSPITAL_COMMUNITY): Payer: Medicare Other

## 2018-02-15 ENCOUNTER — Encounter (HOSPITAL_COMMUNITY): Payer: Self-pay

## 2018-02-15 DIAGNOSIS — R262 Difficulty in walking, not elsewhere classified: Secondary | ICD-10-CM

## 2018-02-15 DIAGNOSIS — M79651 Pain in right thigh: Secondary | ICD-10-CM | POA: Diagnosis not present

## 2018-02-15 DIAGNOSIS — G8929 Other chronic pain: Secondary | ICD-10-CM | POA: Diagnosis not present

## 2018-02-15 DIAGNOSIS — M5416 Radiculopathy, lumbar region: Secondary | ICD-10-CM

## 2018-02-15 DIAGNOSIS — M5441 Lumbago with sciatica, right side: Secondary | ICD-10-CM | POA: Diagnosis not present

## 2018-02-15 NOTE — Therapy (Signed)
[place], [place], [address] Phone: [phone]   Fax:  [phone]  Physical Therapy Treatment  Patient Details  Name: Samuel Gregory MRN: [id number] Date of Birth: 1941/07/26 Referring Provider: Myrtis Ser    Encounter Date: 02/15/2018  PT End of Session - 02/15/18 0909    Visit Number  2    Number of Visits  12    Date for PT Re-Evaluation  03/29/18 Minireassess visit 8/10    Authorization Type  Medicare Part A and BCBS Supplement;    Authorization Time Period  cert 6/96-->7/89/38    Authorization - Visit Number  2    Authorization - Number of Visits  10    PT Start Time  0905    PT Stop Time  0946    PT Time Calculation (min)  41 min    Activity Tolerance  Patient tolerated treatment well;No increased pain    Behavior During Therapy  WFL for tasks assessed/performed       Past Medical History:  Diagnosis Date  . Arthritis   . Collagen vascular disease ([place])   . Coronary atherosclerosis of native coronary artery    Multivessel status post CABG 2000  . Essential hypertension, benign   . Hyperlipidemia   . Prostate cancer [facility])     Past Surgical History:  Procedure Laterality Date  . APPENDECTOMY     8 yrs ago- Dr Geroge Baseman  . CATARACT EXTRACTION W/PHACO Left 07/14/2014   Procedure: CATARACT EXTRACTION PHACO AND INTRAOCULAR LENS PLACEMENT (IOC);  Surgeon: Tonny Branch, MD;  Location: [facility];  Service: Ophthalmology;  Laterality: Left;  CDE:6.20  . CATARACT EXTRACTION W/PHACO Right 08/11/2014   Procedure: CATARACT EXTRACTION PHACO AND INTRAOCULAR LENS PLACEMENT RIGHT EYE;  Surgeon: Tonny Branch, MD;  Location: [facility];  Service: Ophthalmology;  Laterality: Right;  CDE:10.11  . COLONOSCOPY N/A 01/14/2015   Procedure: COLONOSCOPY;  Surgeon: Rogene Houston, MD;  Location: [facility];  Service: Endoscopy;  Laterality: N/A;  730  . CORONARY ARTERY BYPASS GRAFT  01/1999   LIMA to LAD, SVG to diagonal,  sequential SVG to PDA and PLB  . [place]OST[place]Y  2004   [name]  . TONSILLECTOMY     age 84    There were no vitals filed for this visit.  Subjective Assessment - 02/15/18 0907    Subjective  Pt stated he has soreness across lower back since surgery and calf soreness following lots of walking and steps over weekend.      Pertinent History  prostate cancer, 5 heart stents, lumbar stenosis with bil leg pain    Patient Stated Goals  to get ready for surgery on 12/19/17    Currently in Pain?  Yes    Pain Score  3     Pain Location  Back    Pain Orientation  Lower    Pain Descriptors / Indicators  Sore    Pain Type  Acute pain    Pain Radiating Towards  B LE    Pain Onset  More than a month ago    Pain Frequency  Constant    Aggravating Factors   not sure    Pain Relieving Factors  time    Effect of Pain on Daily Activities  limits                       [facility] Adult PT Treatment/Exercise - 02/15/18 0001  Lumbar Exercises: Seated   Other Seated Lumbar Exercises  sitting tall, ab set, scapular retraction, cervical retraction all x 10      Lumbar Exercises: Supine   Ab Set  10 reps;5 seconds    Pelvic Tilt  10 reps;5 seconds    Pelvic Tilt Limitations  cueing for posterior pelvic rotation with TA abdominal contraction vs. gluteal rotation    Clam  10 reps;5 seconds RTB    Bent Knee Raise  10 reps;5 seconds;Limitations    Bent Knee Raise Limitations  with TA activation    Bridge  10 reps;3 seconds;Limitations    Bridge Limitations  2 sets    Straight Leg Raise  5 reps;Limitations    Straight Leg Raises Limitations  with TA activation      Lumbar Exercises: Sidelying   Hip Abduction  Both;5 reps             PT Education - 02/15/18 0921    Education provided  Yes    Education Details  Reviewed goals, assured compliance with HEP and copy of eval given to pt.  Pt educated on importance of core strengthening to assist with lumbar stabilization for  pain control    Person(s) Educated  Patient    Methods  Explanation;Demonstration;Verbal cues;Tactile cues    Comprehension  Verbalized understanding;Returned demonstration       PT Short Term Goals - 02/13/18 1126      PT SHORT TERM GOAL #1   Title  Pt to demonstrate and be able to verbalize the importance of good posture to decrease stress of pt low back to prevent reinjury.    Time  3    Period  Weeks    Status  New    Target Date  03/06/18      PT SHORT TERM GOAL #2   Title  Pt to be able to demonstrate good body mechanics for lifting and bed mobility to decrease stress of pt low back to prevent reinjury    Time  3    Period  Weeks    Status  New      PT SHORT TERM GOAL #3   Title  Pt pain level in his back to be no greater than a 2/10 to be able to walk a 20 minutes mile.     Time  3    Period  Weeks        PT Long Term Goals - 02/13/18 1132      PT LONG TERM GOAL #1   Title  Pt B LE strength to increase to at least 5-/5 to allow pt to go up and down 2 flights of steps with to pain or difficulty     Time  6    Period  Weeks    Status  New    Target Date  03/27/18      PT LONG TERM GOAL #2   Title  PT to be able to walk 2 miles in 35 miutes to return to pre surgery work out routine.     Time  6    Period  Weeks    Status  New      PT LONG TERM GOAL #3   Title  PT to be able to return to work with no increased back pain.     Time  6    Period  Weeks    Status  New      PT LONG TERM GOAL #4  Title  PT to be able to single leg stance for 30 seconds bilaterally to demonstrate improved core strength and reduce risk of falling     Time  6    Period  Weeks    Status  New            Plan - 02/15/18 0930    Clinical Impression Statement  Reviewed goals, assured compliance with HEP and copy of eval given to pt.  Session focus on lumbar stabilization and proximal strengthening.  Verbal and tactile cueing to improve posterior rotation with TA activation with  exercises as well as reminders to breath.  No reports of increased pain through session.      Rehab Potential  Good    Clinical Impairments Affecting Rehab Potential  negative - chronicity of condition, amplitude pain increases with too much activity; positive - motivated, pleasant    PT Frequency  2x / week    PT Duration  6 weeks    PT Treatment/Interventions  ADLs/Self Care Home Management;Cryotherapy;Gait training;Stair training;Functional mobility training;Therapeutic activities;Therapeutic exercise;Balance training;Neuromuscular re-education;Patient/family education;Manual techniques;Scar mobilization    PT Next Visit Plan  begin lumbar stabilization program to include bent knee lift, clam bridging, SLR and hip abduction; progress to prone, quadriped and standing     PT Home Exercise Plan  sitting tall holding x 10 seconds/ RT Dorisflexion, scapular retraction; cervical retraction        Patient will benefit from skilled therapeutic intervention in order to improve the following deficits and impairments:  Pain, Decreased mobility, Postural dysfunction, Decreased activity tolerance, Decreased range of motion, Difficulty walking, Decreased balance, Decreased strength, Improper body mechanics, Impaired flexibility  Visit Diagnosis: Difficulty in walking, not elsewhere classified  Radiculopathy, lumbar region     Problem List Patient Active Problem List   Diagnosis Date Noted  . Essential hypertension 01/23/2013  . Coronary atherosclerosis of native coronary artery 01/23/2013  . Mixed hyperlipidemia 01/23/2013   Ihor Austin, [place]; [place]  Aldona Lento 02/15/2018, 12:55 PM  [facility] [address] [place], [place], [address] Phone: [phone]   Fax:  [phone]  Name: Samuel Gregory MRN: [id number] Date of Birth: Dec 11, 1940

## 2018-02-19 ENCOUNTER — Encounter (HOSPITAL_COMMUNITY): Payer: Self-pay

## 2018-02-19 ENCOUNTER — Ambulatory Visit (HOSPITAL_COMMUNITY): Payer: Medicare Other

## 2018-02-19 ENCOUNTER — Telehealth (HOSPITAL_COMMUNITY): Payer: Self-pay

## 2018-02-19 ENCOUNTER — Other Ambulatory Visit: Payer: Self-pay

## 2018-02-19 DIAGNOSIS — M79651 Pain in right thigh: Secondary | ICD-10-CM | POA: Diagnosis not present

## 2018-02-19 DIAGNOSIS — G8929 Other chronic pain: Secondary | ICD-10-CM

## 2018-02-19 DIAGNOSIS — M5441 Lumbago with sciatica, right side: Secondary | ICD-10-CM | POA: Diagnosis not present

## 2018-02-19 DIAGNOSIS — R262 Difficulty in walking, not elsewhere classified: Secondary | ICD-10-CM | POA: Diagnosis not present

## 2018-02-19 DIAGNOSIS — M5416 Radiculopathy, lumbar region: Secondary | ICD-10-CM | POA: Diagnosis not present

## 2018-02-19 NOTE — Telephone Encounter (Signed)
Nothing  To report

## 2018-02-19 NOTE — Therapy (Signed)
[place], [place], [address] Phone: [phone]   Fax:  [phone]  Physical Therapy Treatment  Patient Details  Name: Samuel Gregory MRN: [id number] Date of Birth: May 21, 1941 Referring Provider: Myrtis Ser    Encounter Date: 02/19/2018  PT End of Session - 02/19/18 1020    Visit Number  3    Number of Visits  12    Date for PT Re-Evaluation  03/29/18 Minireassess visit 8/10    Authorization Type  Medicare Part A and BCBS Supplement;    Authorization Time Period  cert 4/43-->1/54/00    Authorization - Visit Number  3    Authorization - Number of Visits  10    PT Start Time  [phone]    PT Stop Time  1030    PT Time Calculation (min)  39 min    Activity Tolerance  Patient tolerated treatment well;No increased pain    Behavior During Therapy  WFL for tasks assessed/performed       Past Medical History:  Diagnosis Date  . Arthritis   . Collagen vascular disease ([place])   . Coronary atherosclerosis of native coronary artery    Multivessel status post CABG 2000  . Essential hypertension, benign   . Hyperlipidemia   . Prostate cancer [facility])     Past Surgical History:  Procedure Laterality Date  . APPENDECTOMY     8 yrs ago- Dr Geroge Baseman  . CATARACT EXTRACTION W/PHACO Left 07/14/2014   Procedure: CATARACT EXTRACTION PHACO AND INTRAOCULAR LENS PLACEMENT (IOC);  Surgeon: Tonny Branch, MD;  Location: [facility];  Service: Ophthalmology;  Laterality: Left;  CDE:6.20  . CATARACT EXTRACTION W/PHACO Right 08/11/2014   Procedure: CATARACT EXTRACTION PHACO AND INTRAOCULAR LENS PLACEMENT RIGHT EYE;  Surgeon: Tonny Branch, MD;  Location: [facility];  Service: Ophthalmology;  Laterality: Right;  CDE:10.11  . COLONOSCOPY N/A 01/14/2015   Procedure: COLONOSCOPY;  Surgeon: Rogene Houston, MD;  Location[facility];  Service: Endoscopy;  Laterality: N/A;  730  . CORONARY ARTERY BYPASS GRAFT  01/1999   LIMA to LAD, SVG to diagonal,  sequential SVG to PDA and PLB  . PROST[place]OMY  2004   [name]  . TONSILLECTOMY     age 77    There were no vitals filed for this visit.  Subjective Assessment - 02/19/18 0959    Subjective  patient reports he is feeling well and that he has had no difficulty with his HEP so far but still finds it challenging. He reports he is still having tingling in his LE's.     Pertinent History  prostate cancer, 5 heart stents, lumbar stenosis with bil leg pain    Limitations  Lifting;Standing;Walking;House hold activities    Patient Stated Goals  to get ready for surgery on 12/19/17    Currently in Pain?  Yes    Pain Score  1     Pain Location  Leg    Pain Orientation  Right;Left    Pain Descriptors / Indicators  Tingling    Pain Type  Chronic pain    Pain Onset  More than a month ago    Pain Frequency  Constant    Aggravating Factors   unsure    Pain Relieving Factors  rest    Effect of Pain on Daily Activities  some limitations       [facility] Adult PT Treatment/Exercise - 02/19/18 0001      Lumbar Exercises: Stretches  Single Knee to Chest Stretch  5 reps;Right;Left;10 seconds      Lumbar Exercises: Standing   Row  Strengthening;Both;10 reps;Theraband;Limitations    Theraband Level (Row)  Level 2 (Red)    Row Limitations  2 sets    Shoulder Extension  Strengthening;Both;10 reps;Theraband;Limitations    Theraband Level (Shoulder Extension)  Level 2 (Red)    Shoulder Extension Limitations  2 sets      Lumbar Exercises: Supine   Pelvic Tilt  10 reps;5 seconds    Pelvic Tilt Limitations  cueing for posterior pelvic rotation with TA abdominal contraction vs. gluteal rotation    Bent Knee Raise  15 reps    Bent Knee Raise Limitations  with TA activation    Straight Leg Raise  10 reps;Limitations    Straight Leg Raises Limitations  2 sets, cues for quad activation    Other Supine Lumbar Exercises  Decompression exercise 1-5 5 reps x 3' holds      Lumbar Exercises: Sidelying    Clam  Both;10 reps;Limitations;3 seconds    Clam Limitations  Red TB, 2 sets        PT Education - 02/19/18 1016    Education provided  Yes    Education Details  Educated on exercises throughout session, discussed performance of HEP on days when he is not in therapy.     Person(s) Educated  Patient    Methods  Explanation;Demonstration    Comprehension  Verbalized understanding;Returned demonstration       PT Short Term Goals - 02/13/18 1126      PT SHORT TERM GOAL #1   Title  Pt to demonstrate and be able to verbalize the importance of good posture to decrease stress of pt low back to prevent reinjury.    Time  3    Period  Weeks    Status  New    Target Date  03/06/18      PT SHORT TERM GOAL #2   Title  Pt to be able to demonstrate good body mechanics for lifting and bed mobility to decrease stress of pt low back to prevent reinjury    Time  3    Period  Weeks    Status  New      PT SHORT TERM GOAL #3   Title  Pt pain level in his back to be no greater than a 2/10 to be able to walk a 20 minutes mile.     Time  3    Period  Weeks        PT Long Term Goals - 02/13/18 1132      PT LONG TERM GOAL #1   Title  Pt B LE strength to increase to at least 5-/5 to allow pt to go up and down 2 flights of steps with to pain or difficulty     Time  6    Period  Weeks    Status  New    Target Date  03/27/18      PT LONG TERM GOAL #2   Title  PT to be able to walk 2 miles in 35 miutes to return to pre surgery work out routine.     Time  6    Period  Weeks    Status  New      PT LONG TERM GOAL #3   Title  PT to be able to return to work with no increased back pain.     Time  6  Period  Weeks    Status  New      PT LONG TERM GOAL #4   Title  PT to be able to single leg stance for 30 seconds bilaterally to demonstrate improved core strength and reduce risk of falling     Time  6    Period  Weeks    Status  New        Plan - 02/19/18 5009    Clinical Impression  Statement  Continued with focus on lumbar stabilization exercises and on proximal LE strengthening. Added postural strengthening this session with therabands. Patient required minimal cues after demonstration to achieve proper form for exercises this session and demonstrated good carryover with posterior pelvic tilts for TA activation. He reported muscle fatigue at EOS but denied pain. He will continue to benefit from skilled PT interventions to address current impairments and progress towards goals.    Rehab Potential  Good    Clinical Impairments Affecting Rehab Potential  negative - chronicity of condition, amplitude pain increases with too much activity; positive - motivated, pleasant    PT Frequency  2x / week    PT Duration  6 weeks    PT Treatment/Interventions  ADLs/Self Care Home Management;Cryotherapy;Gait training;Stair training;Functional mobility training;Therapeutic activities;Therapeutic exercise;Balance training;Neuromuscular re-education;Patient/family education;Manual techniques;Scar mobilization    PT Next Visit Plan  Continue lumbar stabilization program to include bent knee lift, clam bridging, SLR and hip abduction; progress to prone, quadruped and standing. Continue theraband for postural strengthening. Update HEP for core stabilization.    PT Home Exercise Plan  sitting tall holding x 10 seconds/ RT Dorisflexion, scapular retraction; cervical retraction     Consulted and Agree with Plan of Care  Patient       Patient will benefit from skilled therapeutic intervention in order to improve the following deficits and impairments:  Pain, Decreased mobility, Postural dysfunction, Decreased activity tolerance, Decreased range of motion, Difficulty walking, Decreased balance, Decreased strength, Improper body mechanics, Impaired flexibility  Visit Diagnosis: Difficulty in walking, not elsewhere classified  Radiculopathy, lumbar region  Chronic right-sided low back pain with  right-sided sciatica  Pain in right thigh     Problem List Patient Active Problem List   Diagnosis Date Noted  . Essential hypertension 01/23/2013  . Coronary atherosclerosis of native coronary artery 01/23/2013  . Mixed hyperlipidemia 01/23/2013    Kipp Brood, PT, DPT Physical Therapist with [facility]  02/19/2018 11:05 AM    [place], [place], [address] Phone: [phone]   Fax:  [phone]  Name: Samuel Gregory MRN: [id number] Date of Birth: January 14, 1941

## 2018-02-20 ENCOUNTER — Encounter (HOSPITAL_COMMUNITY): Payer: Medicare Other

## 2018-02-20 DIAGNOSIS — H02055 Trichiasis without entropian left lower eyelid: Secondary | ICD-10-CM | POA: Diagnosis not present

## 2018-02-21 ENCOUNTER — Encounter (HOSPITAL_COMMUNITY): Payer: Self-pay

## 2018-02-21 ENCOUNTER — Ambulatory Visit (HOSPITAL_COMMUNITY): Payer: Medicare Other

## 2018-02-21 DIAGNOSIS — M79651 Pain in right thigh: Secondary | ICD-10-CM

## 2018-02-21 DIAGNOSIS — M5416 Radiculopathy, lumbar region: Secondary | ICD-10-CM | POA: Diagnosis not present

## 2018-02-21 DIAGNOSIS — R7301 Impaired fasting glucose: Secondary | ICD-10-CM | POA: Diagnosis not present

## 2018-02-21 DIAGNOSIS — I2510 Atherosclerotic heart disease of native coronary artery without angina pectoris: Secondary | ICD-10-CM | POA: Diagnosis not present

## 2018-02-21 DIAGNOSIS — E875 Hyperkalemia: Secondary | ICD-10-CM | POA: Diagnosis not present

## 2018-02-21 DIAGNOSIS — R262 Difficulty in walking, not elsewhere classified: Secondary | ICD-10-CM | POA: Diagnosis not present

## 2018-02-21 DIAGNOSIS — M5441 Lumbago with sciatica, right side: Secondary | ICD-10-CM

## 2018-02-21 DIAGNOSIS — G8929 Other chronic pain: Secondary | ICD-10-CM | POA: Diagnosis not present

## 2018-02-21 DIAGNOSIS — R946 Abnormal results of thyroid function studies: Secondary | ICD-10-CM | POA: Diagnosis not present

## 2018-02-21 NOTE — Therapy (Signed)
[place], [place], [address] Phone: [phone]   Fax:  [phone]  Physical Therapy Treatment  Patient Details  Name: Samuel Gregory MRN: [id number] Date of Birth: March 08, 1941 Referring Provider: Myrtis Ser    Encounter Date: 02/21/2018  PT End of Session - 02/21/18 1725    Visit Number  4    Number of Visits  12    Date for PT Re-Evaluation  03/29/18 Minireassess visit 8/10    Authorization Type  Medicare Part A and BCBS Supplement;    Authorization Time Period  cert 0/99-->8/33/82    Authorization - Visit Number  4    Authorization - Number of Visits  10    PT Start Time  1520    PT Stop Time  1602    PT Time Calculation (min)  42 min    Activity Tolerance  Patient tolerated treatment well;No increased pain reports pain reduced at EOS    Behavior During Therapy  [place] for tasks assessed/performed       Past Medical History:  Diagnosis Date  . Arthritis   . Collagen vascular disease ([place])   . Coronary atherosclerosis of native coronary artery    Multivessel status post CABG 2000  . Essential hypertension, benign   . Hyperlipidemia   . Prostate cancer [facility])     Past Surgical History:  Procedure Laterality Date  . APPENDECTOMY     8 yrs ago- Dr Geroge Baseman  . CATARACT EXTRACTION W/PHACO Left 07/14/2014   Procedure: CATARACT EXTRACTION PHACO AND INTRAOCULAR LENS PLACEMENT (IOC);  Surgeon: Tonny Branch, MD;  Location: [facility];  Service: Ophthalmology;  Laterality: Left;  CDE:6.20  . CATARACT EXTRACTION W/PHACO Right 08/11/2014   Procedure: CATARACT EXTRACTION PHACO AND INTRAOCULAR LENS PLACEMENT RIGHT EYE;  Surgeon: Tonny Branch, MD;  Location: [facility];  Service: Ophthalmology;  Laterality: Right;  CDE:10.11  . COLONOSCOPY N/A 01/14/2015   Procedure: COLONOSCOPY;  Surgeon: Rogene Houston, MD;  [facility];  Service: Endoscopy;  Laterality: N/A;  730  . CORONARY ARTERY BYPASS GRAFT  01/1999   LIMA  to LAD, SVG to diagonal, sequential SVG to PDA and PLB  . PR[place]CTOMY  2004   [name]  . TONSILLECTOMY     age 11    There were no vitals filed for this visit.  Subjective Assessment - 02/21/18 1527    Subjective  Pt stated he is feeling sore today, pain scale 4/10 across lower back/gluteal area.    Pertinent History  prostate cancer, 5 heart stents, lumbar stenosis with bil leg pain    Patient Stated Goals  to get ready for surgery on 12/19/17    Currently in Pain?  Yes    Pain Score  4     Pain Location  Back    Pain Orientation  Right;Left;Lower    Pain Descriptors / Indicators  Sore    Pain Type  Chronic pain    Pain Onset  More than a month ago    Pain Frequency  Constant    Aggravating Factors   unsure    Pain Relieving Factors  rest    Effect of Pain on Daily Activities  some limitations                       [facility] Adult PT Treatment/Exercise - 02/21/18 0001      Lumbar Exercises: Standing   Scapular Retraction  Both;15 reps;Theraband  Theraband Level (Scapular Retraction)  Level 2 (Red)    Row  Strengthening;Both;15 reps;Theraband    Theraband Level (Row)  Level 2 (Red)    Shoulder Extension  Strengthening;15 reps;Theraband    Theraband Level (Shoulder Extension)  Level 2 (Red)      Lumbar Exercises: Supine   Pelvic Tilt  10 reps;5 seconds    Pelvic Tilt Limitations  cueing for posterior pelvic rotation with TA abdominal contraction vs. gluteal rotation    Bent Knee Raise  15 reps    Bent Knee Raise Limitations  with TA activation      Lumbar Exercises: Sidelying   Hip Abduction  Both;10 reps      Lumbar Exercises: Prone   Straight Leg Raise  10 reps    Other Prone Lumbar Exercises  heel squeeze 10x 5               PT Short Term Goals - 02/13/18 1126      PT SHORT TERM GOAL #1   Title  Pt to demonstrate and be able to verbalize the importance of good posture to decrease stress of pt low back to prevent reinjury.    Time   3    Period  Weeks    Status  New    Target Date  03/06/18      PT SHORT TERM GOAL #2   Title  Pt to be able to demonstrate good body mechanics for lifting and bed mobility to decrease stress of pt low back to prevent reinjury    Time  3    Period  Weeks    Status  New      PT SHORT TERM GOAL #3   Title  Pt pain level in his back to be no greater than a 2/10 to be able to walk a 20 minutes mile.     Time  3    Period  Weeks        PT Long Term Goals - 02/13/18 1132      PT LONG TERM GOAL #1   Title  Pt B LE strength to increase to at least 5-/5 to allow pt to go up and down 2 flights of steps with to pain or difficulty     Time  6    Period  Weeks    Status  New    Target Date  03/27/18      PT LONG TERM GOAL #2   Title  PT to be able to walk 2 miles in 35 miutes to return to pre surgery work out routine.     Time  6    Period  Weeks    Status  New      PT LONG TERM GOAL #3   Title  PT to be able to return to work with no increased back pain.     Time  6    Period  Weeks    Status  New      PT LONG TERM GOAL #4   Title  PT to be able to single leg stance for 30 seconds bilaterally to demonstrate improved core strength and reduce risk of falling     Time  6    Period  Weeks    Status  New            Plan - 02/21/18 1726    Clinical Impression Statement  Continued with focus on lumbar stabilizaiton and proximal LE strengthening.  Progressed to  prone heel squeeze and SLR for gluteal strengthening with cueing for form.  Reviewed compliance with HEP, pt able to verbalize majority of exercises, continues to anterior pelvic tilt with TA, verbal and tactile cueing and educated on importance of proper technique with all HEP.  No additional exercises added to HEP today due to soreness and improper mechanics with TA activaiton.  No reports of increased pain through session, was limited by fatigue with activities.      Rehab Potential  Good    Clinical Impairments  Affecting Rehab Potential  negative - chronicity of condition, amplitude pain increases with too much activity; positive - motivated, pleasant    PT Frequency  2x / week    PT Duration  6 weeks    PT Treatment/Interventions  ADLs/Self Care Home Management;Cryotherapy;Gait training;Stair training;Functional mobility training;Therapeutic activities;Therapeutic exercise;Balance training;Neuromuscular re-education;Patient/family education;Manual techniques;Scar mobilization    PT Next Visit Plan  Continue lumbar stabilization program.  Next session assure proper form with TA activation/posterior pelvic tilt and progress to additional HEP with bent knee lift and SLR with TA activaiton if proper mechanics.  Add rows and prone shoulder extension next session and progress to quadruped and standing exercises as able.    PT Home Exercise Plan  sitting tall holding x 10 seconds/ RT Dorisflexion, scapular retraction; cervical retraction        Patient will benefit from skilled therapeutic intervention in order to improve the following deficits and impairments:  Pain, Decreased mobility, Postural dysfunction, Decreased activity tolerance, Decreased range of motion, Difficulty walking, Decreased balance, Decreased strength, Improper body mechanics, Impaired flexibility  Visit Diagnosis: Difficulty in walking, not elsewhere classified  Radiculopathy, lumbar region  Chronic right-sided low back pain with right-sided sciatica  Pain in right thigh     Problem List Patient Active Problem List   Diagnosis Date Noted  . Essential hypertension 01/23/2013  . Coronary atherosclerosis of native coronary artery 01/23/2013  . Mixed hyperlipidemia 01/23/2013   Ihor Austin, [place]; [place]  Aldona Lento 02/21/2018, 5:47 PM  [place] [address] [place], [place], [address] Phone: [phone]   Fax:  [phone]  Name: Samuel Gregory MRN:  [id number] Date of Birth: 27-Sep-1940

## 2018-02-22 ENCOUNTER — Encounter (HOSPITAL_COMMUNITY): Payer: Medicare Other

## 2018-02-26 ENCOUNTER — Encounter (HOSPITAL_COMMUNITY): Payer: Medicare Other | Admitting: Physical Therapy

## 2018-02-27 ENCOUNTER — Ambulatory Visit (HOSPITAL_COMMUNITY): Payer: Medicare Other | Admitting: Physical Therapy

## 2018-02-27 DIAGNOSIS — M5441 Lumbago with sciatica, right side: Secondary | ICD-10-CM

## 2018-02-27 DIAGNOSIS — M5416 Radiculopathy, lumbar region: Secondary | ICD-10-CM | POA: Diagnosis not present

## 2018-02-27 DIAGNOSIS — R946 Abnormal results of thyroid function studies: Secondary | ICD-10-CM | POA: Diagnosis not present

## 2018-02-27 DIAGNOSIS — M79651 Pain in right thigh: Secondary | ICD-10-CM

## 2018-02-27 DIAGNOSIS — R7301 Impaired fasting glucose: Secondary | ICD-10-CM | POA: Diagnosis not present

## 2018-02-27 DIAGNOSIS — G8929 Other chronic pain: Secondary | ICD-10-CM

## 2018-02-27 DIAGNOSIS — D509 Iron deficiency anemia, unspecified: Secondary | ICD-10-CM | POA: Diagnosis not present

## 2018-02-27 DIAGNOSIS — M48061 Spinal stenosis, lumbar region without neurogenic claudication: Secondary | ICD-10-CM | POA: Diagnosis not present

## 2018-02-27 DIAGNOSIS — E782 Mixed hyperlipidemia: Secondary | ICD-10-CM | POA: Diagnosis not present

## 2018-02-27 DIAGNOSIS — R262 Difficulty in walking, not elsewhere classified: Secondary | ICD-10-CM

## 2018-02-27 DIAGNOSIS — I25810 Atherosclerosis of coronary artery bypass graft(s) without angina pectoris: Secondary | ICD-10-CM | POA: Diagnosis not present

## 2018-02-27 DIAGNOSIS — Z8546 Personal history of malignant neoplasm of prostate: Secondary | ICD-10-CM | POA: Diagnosis not present

## 2018-02-27 DIAGNOSIS — E875 Hyperkalemia: Secondary | ICD-10-CM | POA: Diagnosis not present

## 2018-02-27 DIAGNOSIS — Z0001 Encounter for general adult medical examination with abnormal findings: Secondary | ICD-10-CM | POA: Diagnosis not present

## 2018-02-27 NOTE — Therapy (Signed)
[place], [place], [address] Phone: [phone]   Fax:  [phone]  Physical Therapy Treatment  Patient Details  Name: Samuel Gregory MRN: [id number] Date of Birth: 1940/12/04 Referring Provider: Myrtis Ser    Encounter Date: 02/27/2018  PT End of Session - 02/27/18 1027    Visit Number  5    Number of Visits  12    Date for PT Re-Evaluation  03/29/18 Minireassess visit 8/10    Authorization Type  Medicare Part A and BCBS Supplement;    Authorization Time Period  cert 9/67-->5/91/63    Authorization - Visit Number  5    Authorization - Number of Visits  10    PT Start Time  [phone]    PT Stop Time  1027    PT Time Calculation (min)  38 min    Activity Tolerance  Patient tolerated treatment well;No increased pain reports pain reduced at EOS    Behavior During Therapy  [facility] for tasks assessed/performed       Past Medical History:  Diagnosis Date  . Arthritis   . Collagen vascular disease ([place])   . Coronary atherosclerosis of native coronary artery    Multivessel status post CABG 2000  . Essential hypertension, benign   . Hyperlipidemia   . Prostate cancer [facility])     Past Surgical History:  Procedure Laterality Date  . APPENDECTOMY     8 yrs ago- Dr Geroge Baseman  . CATARACT EXTRACTION W/PHACO Left 07/14/2014   Procedure: CATARACT EXTRACTION PHACO AND INTRAOCULAR LENS PLACEMENT (IOC);  Surgeon: Tonny Branch, MD;  Location: [facility];  Service: Ophthalmology;  Laterality: Left;  CDE:6.20  . CATARACT EXTRACTION W/PHACO Right 08/11/2014   Procedure: CATARACT EXTRACTION PHACO AND INTRAOCULAR LENS PLACEMENT RIGHT EYE;  Surgeon: Tonny Branch, MD;  Location: [facility] ORS;  Service: Ophthalmology;  Laterality: Right;  CDE:10.11  . COLONOSCOPY N/A 01/14/2015   Procedure: COLONOSCOPY;  Surgeon: Rogene Houston, MD;  Location: [facility];  Service: Endoscopy;  Laterality: N/A;  730  . CORONARY ARTERY BYPASS GRAFT  01/1999   LIMA  to LAD, SVG to diagonal, sequential SVG to PDA and PLB  . [place]OST[place]OMY  2004   [name]  . TONSILLECTOMY     age 77    There were no vitals filed for this visit.  Subjective Assessment - 02/27/18 0952    Subjective  PT states he's a little sore across his back from the manual completed last session.  STates most his pain is in his feet.     Pain Score  3     Pain Location  Back    Pain Orientation  Right;Left;Lower    Pain Descriptors / Indicators  Sore    Pain Type  Chronic pain    Pain Radiating Towards  into feet 5/10 dependent on how much he is up on his feet.                         [facility] Adult PT Treatment/Exercise - 02/27/18 0001      Lumbar Exercises: Standing   Scapular Retraction  Both;15 reps;Theraband    Theraband Level (Scapular Retraction)  Level 2 (Red)    Row  Strengthening;Both;15 reps;Theraband    Theraband Level (Row)  Level 2 (Red)    Shoulder Extension  Strengthening;15 reps;Theraband    Theraband Level (Shoulder Extension)  Level 2 (Red)      Lumbar  Exercises: Seated   Sit to Stand  5 reps;Limitations    Sit to Stand Limitations  no UE's      Lumbar Exercises: Supine   Bridge  10 reps    Straight Leg Raise  10 reps      Lumbar Exercises: Sidelying   Clam  Both;10 reps;Limitations;3 seconds    Clam Limitations  green TB, 2 sets    Hip Abduction  15 reps      Lumbar Exercises: Prone   Straight Leg Raise  10 reps    Other Prone Lumbar Exercises  heel squeeze 10x 5    Other Prone Lumbar Exercises  rows, shoulder extension 10 reps each               PT Short Term Goals - 02/13/18 1126      PT SHORT TERM GOAL #1   Title  Pt to demonstrate and be able to verbalize the importance of good posture to decrease stress of pt low back to prevent reinjury.    Time  3    Period  Weeks    Status  New    Target Date  03/06/18      PT SHORT TERM GOAL #2   Title  Pt to be able to demonstrate good body mechanics for lifting  and bed mobility to decrease stress of pt low back to prevent reinjury    Time  3    Period  Weeks    Status  New      PT SHORT TERM GOAL #3   Title  Pt pain level in his back to be no greater than a 2/10 to be able to walk a 20 minutes mile.     Time  3    Period  Weeks        PT Long Term Goals - 02/13/18 1132      PT LONG TERM GOAL #1   Title  Pt B LE strength to increase to at least 5-/5 to allow pt to go up and down 2 flights of steps with to pain or difficulty     Time  6    Period  Weeks    Status  New    Target Date  03/27/18      PT LONG TERM GOAL #2   Title  PT to be able to walk 2 miles in 35 miutes to return to pre surgery work out routine.     Time  6    Period  Weeks    Status  New      PT LONG TERM GOAL #3   Title  PT to be able to return to work with no increased back pain.     Time  6    Period  Weeks    Status  New      PT LONG TERM GOAL #4   Title  PT to be able to single leg stance for 30 seconds bilaterally to demonstrate improved core strength and reduce risk of falling     Time  6    Period  Weeks    Status  New            Plan - 02/27/18 1028    Clinical Impression Statement  continued focus on lumbar stab and LE strengthening.  Added prone UE rows and extension to increase thoracic/scapular strength.  PT required cues for form with most therex, however very little needed with theraband postural strengthening.  Pt able to complete all reps, hip extension in prone most difficult.  No pain or issues at EOS, reports he is getting stronger.     Rehab Potential  Good    Clinical Impairments Affecting Rehab Potential  negative - chronicity of condition, amplitude pain increases with too much activity; positive - motivated, pleasant    PT Frequency  2x / week    PT Duration  6 weeks    PT Treatment/Interventions  ADLs/Self Care Home Management;Cryotherapy;Gait training;Stair training;Functional mobility training;Therapeutic activities;Therapeutic  exercise;Balance training;Neuromuscular re-education;Patient/family education;Manual techniques;Scar mobilization    PT Next Visit Plan  Continue lumbar stabilization program.  Next session progress to quadruped and standing exercises.  Increase reps.    PT Home Exercise Plan  sitting tall holding x 10 seconds/ RT Dorisflexion, scapular retraction; cervical retraction        Patient will benefit from skilled therapeutic intervention in order to improve the following deficits and impairments:  Pain, Decreased mobility, Postural dysfunction, Decreased activity tolerance, Decreased range of motion, Difficulty walking, Decreased balance, Decreased strength, Improper body mechanics, Impaired flexibility  Visit Diagnosis: Difficulty in walking, not elsewhere classified  Radiculopathy, lumbar region  Chronic right-sided low back pain with right-sided sciatica  Pain in right thigh     Problem List Patient Active Problem List   Diagnosis Date Noted  . Essential hypertension 01/23/2013  . Coronary atherosclerosis of native coronary artery 01/23/2013  . Mixed hyperlipidemia 01/23/2013   Teena Irani, PTA/CLT [phone]  Teena Irani 02/27/2018, 10:31 AM  [place] [address] [place], [place], [address] Phone: [phone]   Fax:  [phone]  Name: Samuel Gregory MRN: [id number] Date of Birth: [birth year]

## 2018-02-28 ENCOUNTER — Encounter (HOSPITAL_COMMUNITY): Payer: Medicare Other

## 2018-03-01 ENCOUNTER — Ambulatory Visit (HOSPITAL_COMMUNITY): Payer: Medicare Other | Attending: Orthopedic Surgery

## 2018-03-01 ENCOUNTER — Other Ambulatory Visit: Payer: Self-pay

## 2018-03-01 ENCOUNTER — Encounter (HOSPITAL_COMMUNITY): Payer: Self-pay

## 2018-03-01 DIAGNOSIS — M5441 Lumbago with sciatica, right side: Secondary | ICD-10-CM | POA: Diagnosis not present

## 2018-03-01 DIAGNOSIS — G8929 Other chronic pain: Secondary | ICD-10-CM

## 2018-03-01 DIAGNOSIS — M5416 Radiculopathy, lumbar region: Secondary | ICD-10-CM | POA: Diagnosis not present

## 2018-03-01 DIAGNOSIS — M79651 Pain in right thigh: Secondary | ICD-10-CM | POA: Diagnosis not present

## 2018-03-01 DIAGNOSIS — R262 Difficulty in walking, not elsewhere classified: Secondary | ICD-10-CM

## 2018-03-01 NOTE — Patient Instructions (Signed)
Toe / Heel Raise (Standing)    Progress seated heel/toe raises to standing. Standing with support, raise heels, then rock back on heels and raise toes. Repeat 15 times.  Copyright  VHI. All rights reserved.   Bridging    Slowly raise buttocks from floor, keeping stomach tight. Repeat 15 times per set. Do 1-2 sets per session. Do 1 sessions per day.  http://orth.exer.us/1096   Copyright  VHI. All rights reserved.

## 2018-03-01 NOTE — Therapy (Signed)
[place], [place], [address] Phone: [phone]   Fax:  [phone]  Physical Therapy Treatment  Patient Details  Name: Samuel Gregory MRN: [id number] Date of Birth: 1941-07-09 Referring Provider: Myrtis Ser    Encounter Date: 03/01/2018  PT End of Session - 03/01/18 0953    Visit Number  6    Number of Visits  12    Date for PT Re-Evaluation  03/29/18 Minireassess 8/10    Authorization Type  Medicare Part A and BCBS Supplement;    Authorization Time Period  cert 1/94-->1/74/08    Authorization - Visit Number  6    Authorization - Number of Visits  10    PT Start Time  0944    PT Stop Time  1025    PT Time Calculation (min)  41 min    Activity Tolerance  Patient tolerated treatment well;No increased pain    Behavior During Therapy  WFL for tasks assessed/performed       Past Medical History:  Diagnosis Date  . Arthritis   . Collagen vascular disease ([place])   . Coronary atherosclerosis of native coronary artery    Multivessel status post CABG 2000  . Essential hypertension, benign   . Hyperlipidemia   . Prostate cancer [facility])     Past Surgical History:  Procedure Laterality Date  . APPENDECTOMY     8 yrs ago- Dr Geroge Baseman  . CATARACT EXTRACTION W/PHACO Left 07/14/2014   Procedure: CATARACT EXTRACTION PHACO AND INTRAOCULAR LENS PLACEMENT (IOC);  Surgeon: Tonny Branch, MD;  Location: [facility];  Service: Ophthalmology;  Laterality: Left;  CDE:6.20  . CATARACT EXTRACTION W/PHACO Right 08/11/2014   Procedure: CATARACT EXTRACTION PHACO AND INTRAOCULAR LENS PLACEMENT RIGHT EYE;  Surgeon: Tonny Branch, MD;  Location: [facility];  Service: Ophthalmology;  Laterality: Right;  CDE:10.11  . COLONOSCOPY N/A 01/14/2015   Procedure: COLONOSCOPY;  Surgeon: Rogene Houston, MD;  Location: [facility];  Service: Endoscopy;  Laterality: N/A;  730  . CORONARY ARTERY BYPASS GRAFT  01/1999   LIMA to LAD, SVG to diagonal, sequential  SVG to PDA and PLB  . PROST[place]CTOMY  2004   [name]  . TONSILLECTOMY     age 77    There were no vitals filed for this visit.  Subjective Assessment - 03/01/18 0951    Subjective  Pt stated he feels better every day, current pain scale 2/10 across lower back.  Stated he helped daughter install new airconditioner over weekend, had to go up and down 2 flights of stairs, very sore and tired following.  Reports compliance iwth HEP daily.      Pertinent History  prostate cancer, 5 heart stents, lumbar stenosis with bil leg pain    Patient Stated Goals  to get ready for surgery on 12/19/17    Currently in Pain?  Yes    Pain Score  2     Pain Location  Back    Pain Orientation  Lower    Pain Descriptors / Indicators  Sore    Pain Type  Chronic pain    Pain Onset  More than a month ago    Pain Frequency  Constant    Aggravating Factors   unsure    Pain Relieving Factors  rest    Effect of Pain on Daily Activities  some limitations  [facility] Adult PT Treatment/Exercise - 03/01/18 0001      Lumbar Exercises: Standing   Functional Squats  10 reps    Functional Squats Limitations  chair tapping, verbal cueing for mechanics    Scapular Retraction  Both;15 reps;Theraband    Theraband Level (Scapular Retraction)  Level 2 (Red)    Row  Strengthening;Both;15 reps;Theraband    Theraband Level (Row)  Level 2 (Red)    Shoulder Extension  Strengthening;15 reps;Theraband    Theraband Level (Shoulder Extension)  Level 2 (Red)    Other Standing Lumbar Exercises  marching alternating 10x 5"      Lumbar Exercises: Seated   Sit to Stand  10 reps    Sit to Stand Limitations  no UE's, eccentric control      Lumbar Exercises: Supine   Bridge  15 reps      Lumbar Exercises: Sidelying   Hip Abduction  15 reps;Limitations    Hip Abduction Limitations  rose wall (LE against wall) hip abd and ext      Lumbar Exercises: Prone   Other Prone Lumbar Exercises   rows, shoulder extension 15 reps each      Lumbar Exercises: Quadruped   Single Arm Raise  10 reps    Straight Leg Raise  10 reps               PT Short Term Goals - 02/13/18 1126      PT SHORT TERM GOAL #1   Title  Pt to demonstrate and be able to verbalize the importance of good posture to decrease stress of pt low back to prevent reinjury.    Time  3    Period  Weeks    Status  New    Target Date  03/06/18      PT SHORT TERM GOAL #2   Title  Pt to be able to demonstrate good body mechanics for lifting and bed mobility to decrease stress of pt low back to prevent reinjury    Time  3    Period  Weeks    Status  New      PT SHORT TERM GOAL #3   Title  Pt pain level in his back to be no greater than a 2/10 to be able to walk a 20 minutes mile.     Time  3    Period  Weeks        PT Long Term Goals - 02/13/18 1132      PT LONG TERM GOAL #1   Title  Pt B LE strength to increase to at least 5-/5 to allow pt to go up and down 2 flights of steps with to pain or difficulty     Time  6    Period  Weeks    Status  New    Target Date  03/27/18      PT LONG TERM GOAL #2   Title  PT to be able to walk 2 miles in 35 miutes to return to pre surgery work out routine.     Time  6    Period  Weeks    Status  New      PT LONG TERM GOAL #3   Title  PT to be able to return to work with no increased back pain.     Time  6    Period  Weeks    Status  New      PT LONG TERM GOAL #4  Title  PT to be able to single leg stance for 30 seconds bilaterally to demonstrate improved core strength and reduce risk of falling     Time  6    Period  Weeks    Status  New            Plan - 03/01/18 1020    Clinical Impression Statement  Continued session focus with lumbar stability and LE strengthening.  Progressed to quadruped activities for lumbar stability and gluteal strengthening.  Added squats as well this session for gluteal strengthening.  Pt able to complete majority of  exercises with proper form with some cueing for proper posture and mechanics.      Rehab Potential  Good    Clinical Impairments Affecting Rehab Potential  negative - chronicity of condition, amplitude pain increases with too much activity; positive - motivated, pleasant    PT Frequency  2x / week    PT Duration  6 weeks    PT Treatment/Interventions  ADLs/Self Care Home Management;Cryotherapy;Gait training;Stair training;Functional mobility training;Therapeutic activities;Therapeutic exercise;Balance training;Neuromuscular re-education;Patient/family education;Manual techniques;Scar mobilization    PT Next Visit Plan  Begin SLS and UE/LE quad next session.  Continue lumbar stabilization program.  Continue with quadruped and standing exercises.  Increase reps.    PT Home Exercise Plan  sitting tall holding x 10 seconds/ RT Dorisflexion, scapular retraction; cervical retraction; 03/01/18: bridge and standing heel/toe raises       Patient will benefit from skilled therapeutic intervention in order to improve the following deficits and impairments:  Pain, Decreased mobility, Postural dysfunction, Decreased activity tolerance, Decreased range of motion, Difficulty walking, Decreased balance, Decreased strength, Improper body mechanics, Impaired flexibility  Visit Diagnosis: Difficulty in walking, not elsewhere classified  Radiculopathy, lumbar region  Chronic right-sided low back pain with right-sided sciatica  Pain in right thigh     Problem List Patient Active Problem List   Diagnosis Date Noted  . Essential hypertension 01/23/2013  . Coronary atherosclerosis of native coronary artery 01/23/2013  . Mixed hyperlipidemia 01/23/2013   Ihor Austin, [place]; [place]  Aldona Lento 03/01/2018, 10:37 AM  [place] [facility] [address] [place], [place], [address] Phone: [phone]   Fax:  [phone]  Name: Samuel Gregory MRN:  [id number] Date of Birth: 10-23-1940

## 2018-03-05 ENCOUNTER — Encounter (HOSPITAL_COMMUNITY): Payer: Medicare Other

## 2018-03-06 ENCOUNTER — Ambulatory Visit (HOSPITAL_COMMUNITY): Payer: Medicare Other | Admitting: Physical Therapy

## 2018-03-06 ENCOUNTER — Encounter (HOSPITAL_COMMUNITY): Payer: Self-pay | Admitting: Physical Therapy

## 2018-03-06 ENCOUNTER — Other Ambulatory Visit: Payer: Self-pay

## 2018-03-06 DIAGNOSIS — G8929 Other chronic pain: Secondary | ICD-10-CM | POA: Diagnosis not present

## 2018-03-06 DIAGNOSIS — M79651 Pain in right thigh: Secondary | ICD-10-CM | POA: Diagnosis not present

## 2018-03-06 DIAGNOSIS — R262 Difficulty in walking, not elsewhere classified: Secondary | ICD-10-CM

## 2018-03-06 DIAGNOSIS — M5416 Radiculopathy, lumbar region: Secondary | ICD-10-CM

## 2018-03-06 DIAGNOSIS — M5441 Lumbago with sciatica, right side: Secondary | ICD-10-CM | POA: Diagnosis not present

## 2018-03-06 NOTE — Therapy (Signed)
[facility] [address] [place], [place], [address] Phone: [phone]   Fax:  [phone]  Physical Therapy Treatment  Patient Details  Name: Samuel Gregory MRN: [id number] Date of Birth: 10-Dec-1940 Referring Provider: Myrtis Ser    Encounter Date: 03/06/2018 Progress Note Reporting Period  02/13/2018 to 03/06/2018  See note below for Objective Data and Assessment of Progress/Goals.      PT End of Session - 03/06/18 1019    Visit Number  7    Number of Visits  12    Date for PT Re-Evaluation  03/29/18  Pt reassessed on 8/6  Visit 7   Authorization Type  Medicare Part A and [place];    Authorization Time Period  cert 1/06-->2/69/48    Authorization - Visit Number  7    Authorization - Number of Visits  10    PT Start Time  [phone]    PT Stop Time  1032    PT Time Calculation (min)  49 min    Activity Tolerance  Patient tolerated treatment well;No increased pain    Behavior During Therapy  WFL for tasks assessed/performed       Past Medical History:  Diagnosis Date  . Arthritis   . Collagen vascular disease ([place])   . Coronary atherosclerosis of native coronary artery    Multivessel status post CABG 2000  . Essential hypertension, benign   . Hyperlipidemia   . Prostate cancer [facility])     Past Surgical History:  Procedure Laterality Date  . APPENDECTOMY     8 yrs ago- Dr Geroge Baseman  . CATARACT EXTRACTION W/PHACO Left 07/14/2014   Procedure: CATARACT EXTRACTION PHACO AND INTRAOCULAR LENS PLACEMENT (IOC);  Surgeon: Tonny Branch, MD;  Location: [facility];  Service: Ophthalmology;  Laterality: Left;  CDE:6.20  . CATARACT EXTRACTION W/PHACO Right 08/11/2014   Procedure: CATARACT EXTRACTION PHACO AND INTRAOCULAR LENS PLACEMENT RIGHT EYE;  Surgeon: Tonny Branch, MD;  Location: [facility];  Service: Ophthalmology;  Laterality: Right;  CDE:10.11  . COLONOSCOPY N/A 01/14/2015   Procedure: COLONOSCOPY;  Surgeon: Rogene Houston, MD;   Location: [facility];  Service: Endoscopy;  Laterality: N/A;  730  . CORONARY ARTERY BYPASS GRAFT  01/1999   LIMA to LAD, SVG to diagonal, sequential SVG to PDA and PLB  . [place]Y  2004   [name]  . TONSILLECTOMY     age 24    There were no vitals filed for this visit.  Subjective Assessment - 03/06/18 0947    Subjective  Samuel Gregory states that he was doing some painting on some posts for about three hours yesterday.  He was on his knees and up and down so he is a little sore in his legs and his back is stiff.      Pertinent History  prostate cancer, 5 heart stents, lumbar stenosis with bil leg pain    Patient Stated Goals  to get ready for surgery on 12/19/17    Pain Score  3     Pain Location  Back    Pain Orientation  Lower    Pain Descriptors / Indicators  Sore    Pain Onset  More than a month ago    Aggravating Factors   activity     Pain Relieving Factors  stretching ; nerve pain medication    Effect of Pain on Daily Activities  continues to do them          [facility]  PT Assessment - 03/06/18 0001      Assessment   Medical Diagnosis  lumbar stenosis    Onset Date/Surgical Date  12/19/17    Next MD Visit  12/06/17 - pre-op appointment    Prior Therapy  yes      Precautions   Precautions  None      Restrictions   Weight Bearing Restrictions  No      Prior Function   Level of Independence  Independent    Vocation  Part time employment    Programmer, systems of [employer]   Overall Cognitive Status  Within Functional Limits for tasks assessed      AROM   Lumbar Extension  16    Lumbar - Right Side Bend  15    Lumbar - Left Side Bend  18      Strength   Right Hip Flexion  5/5    Right Hip Extension  5/5 was 4/5     Right Hip ABduction  5/5 was 4/5     Left Hip Flexion  5/5 was 4/5     Left Hip Extension  5/5 was 3+/5     Left Hip ABduction  5/5 was 4+/5     Right Knee Flexion  5/5 was 4+/5     Right Knee Extension  5/5     Left Knee Flexion  5/5    Left Knee Extension  5/5    Right Ankle Dorsiflexion  4-/5 3+/5     Left Ankle Dorsiflexion  5/5      Static Standing Balance   Static Standing Balance -  Activities   Single Leg Stance - Right Leg;Single Leg Stance - Left Leg;Tandam Stance - Right Leg;Tandam Stance - Left Leg                   [facility] Adult PT Treatment/Exercise - 03/06/18 0001      Exercises   Exercises  Lumbar      Lumbar Exercises: Stretches   Active Hamstring Stretch  3 reps;30 seconds    Single Knee to Chest Stretch  Right;Left;3 reps    Standing Side Bend  5 reps    Standing Extension  10 reps      Lumbar Exercises: Standing   Functional Squats  --    Functional Squats Limitations  --    Scapular Retraction  Both;15 reps;Theraband HEP    Theraband Level (Scapular Retraction)  Level 3 (Green)    Row  Strengthening;Both;15 reps;Theraband HEP    Theraband Level (Row)  Level 3 (Green)    Shoulder Extension  Strengthening;15 reps;Theraband HEP    Theraband Level (Shoulder Extension)  Level 3 (Green)    Other Standing Lumbar Exercises  marching alternating 10x 5"    Other Standing Lumbar Exercises  wall arch; v lift off  x 10       Lumbar Exercises: Seated   Sit to Stand  10 reps    Sit to Stand Limitations  no UE's, eccentric control      Lumbar Exercises: Supine   Bridge  --      Lumbar Exercises: Sidelying   Hip Abduction  15 reps;Limitations    Hip Abduction Limitations  rose wall (LE against wall) hip abd and ext      Lumbar Exercises: Prone   Other Prone Lumbar Exercises  rows, shoulder extension 15 reps each      Lumbar Exercises: Quadruped  Single Arm Raise  --    Straight Leg Raise  --    Opposite Arm/Leg Raise  Right arm/Left leg;Left arm/Right leg;10 reps      Manual Therapy   Manual Therapy  --    Manual therapy comments  --    Soft tissue mobilization  --             PT Education - 03/06/18 1101    Education provided  Yes     Education Details  updated HEP; instructed to walk everyday in tall position for exercise.     Person(s) Educated  Patient    Methods  Explanation    Comprehension  Verbalized understanding       PT Short Term Goals - 03/06/18 1027      PT SHORT TERM GOAL #1   Title  Pt to demonstrate and be able to verbalize the importance of good posture to decrease stress of pt low back to prevent reinjury.    Time  3    Period  Weeks    Status  Achieved      PT SHORT TERM GOAL #2   Title  Pt to be able to demonstrate good body mechanics for lifting and bed mobility to decrease stress of pt low back to prevent reinjury    Time  3    Period  Weeks    Status  New      PT SHORT TERM GOAL #3   Title  Pt pain level in his back to be no greater than a 2/10 to be able to walk a 20 minutes mile.     Baseline  03/05/2018: was as high as a 10/10; due to painting the poles yesterday pain is up if not for that pt would say a 2/10    Time  3    Period  Weeks    Status  On-going        PT Long Term Goals - 03/06/18 1029      PT LONG TERM GOAL #1   Title  Pt B LE strength to increase to at least 5-/5 to allow pt to go up and down 2 flights of steps with to pain or difficulty     Time  6    Period  Weeks    Status  Partially Met      PT LONG TERM GOAL #2   Title  PT to be able to walk 2 miles in 35 miutes to return to pre surgery work out routine.     Time  6    Period  Weeks    Status  On-going      PT LONG TERM GOAL #3   Title  PT to be able to return to work with no increased back pain.     Time  6    Period  Weeks    Status  On-going      PT LONG TERM GOAL #4   Title  PT to be able to single leg stance for 30 seconds bilaterally to demonstrate improved core strength and reduce risk of falling     Time  6    Period  Weeks    Status  On-going            Plan - 03/06/18 1058    Clinical Impression Statement  Added prone opposite arm/leg prior to advancing to quadriped activity,  added hamstring stretch as well as given pt t-band and instruction sheet  for standing postual stabilization exercises.  Reviewed pt goals with good progress.  All mm strength has returned to normal except Rt Dorsiflexion.  Pt is noting improvement in functional tasks.  Pt will continue to benefit from skilled physical therapy to educate in body mechanics while lifting, progress to functional stabilization and manual to decrease tension in lumbar area.     Rehab Potential  Good    Clinical Impairments Affecting Rehab Potential  negative - chronicity of condition, amplitude pain increases with too much activity; positive - motivated, pleasant    PT Frequency  2x / week    PT Duration  6 weeks    PT Treatment/Interventions  ADLs/Self Care Home Management;Cryotherapy;Gait training;Stair training;Functional mobility training;Therapeutic activities;Therapeutic exercise;Balance training;Neuromuscular re-education;Patient/family education;Manual techniques;Scar mobilization    PT Next Visit Plan  Begin SLS, lunging and UE/LE quad next session.     PT Home Exercise Plan  sitting tall holding x 10 seconds/ RT Dorisflexion, scapular retraction; cervical retraction; 03/01/18: bridge and standing heel/toe raises    Consulted and Agree with Plan of Care  Patient       Patient will benefit from skilled therapeutic intervention in order to improve the following deficits and impairments:  Pain, Decreased mobility, Postural dysfunction, Decreased activity tolerance, Decreased range of motion, Difficulty walking, Decreased balance, Decreased strength, Improper body mechanics, Impaired flexibility  Visit Diagnosis: Difficulty in walking, not elsewhere classified  Radiculopathy, lumbar region     Problem List Patient Active Problem List   Diagnosis Date Noted  . Essential hypertension 01/23/2013  . Coronary atherosclerosis of native coronary artery 01/23/2013  . Mixed hyperlipidemia 01/23/2013    Rayetta Humphrey, PT CLT [phone] 03/06/2018, 11:06 AM  [facility] [address] [place], [place], [address] Phone: [phone]   Fax:  [phone]  Name: Samuel Gregory MRN: [id number] Date of Birth: 1941-02-05

## 2018-03-06 NOTE — Patient Instructions (Addendum)
Hamstring Stretch: Active    Support behind right knee. Starting with knee bent, attempt to straighten knee until a comfortable stretch is felt in back of thigh. Hold _30 seconds. Repeat __3__ times per set. Do __1__ sets per session. Do _2___ sessions per day.  http://orth.exer.us/158   Copyright  VHI. All rights reserved.  Scapular Retraction (Prone)    Lie with arms at sides. Pinch shoulder blades together and raise arms a few inches from floor. Repeat ___10_ times per set. Do ___1_ sets per session. Do _2___ sessions per day.  http://orth.exer.us/954   Copyright  VHI. All rights reserved.  Scapular: Retraction (Prone)    Holding ___0-2_ pound weights, keep arms out from sides and elbows bent. Pull elbows back, pinching shoulder blades together. Repeat _10___ times per set. Do __1__ sets per session. Do 2____ sessions per day.  http://orth.exer.us/862   Copyright  VHI. All rights reserved.

## 2018-03-07 ENCOUNTER — Ambulatory Visit (HOSPITAL_COMMUNITY): Payer: Medicare Other | Admitting: Physical Therapy

## 2018-03-08 ENCOUNTER — Encounter (HOSPITAL_COMMUNITY): Payer: Self-pay

## 2018-03-08 ENCOUNTER — Ambulatory Visit (HOSPITAL_COMMUNITY): Payer: Medicare Other

## 2018-03-08 ENCOUNTER — Other Ambulatory Visit: Payer: Self-pay

## 2018-03-08 DIAGNOSIS — R262 Difficulty in walking, not elsewhere classified: Secondary | ICD-10-CM | POA: Diagnosis not present

## 2018-03-08 DIAGNOSIS — M79651 Pain in right thigh: Secondary | ICD-10-CM | POA: Diagnosis not present

## 2018-03-08 DIAGNOSIS — G8929 Other chronic pain: Secondary | ICD-10-CM | POA: Diagnosis not present

## 2018-03-08 DIAGNOSIS — M5416 Radiculopathy, lumbar region: Secondary | ICD-10-CM | POA: Diagnosis not present

## 2018-03-08 DIAGNOSIS — H26493 Other secondary cataract, bilateral: Secondary | ICD-10-CM | POA: Diagnosis not present

## 2018-03-08 DIAGNOSIS — M5441 Lumbago with sciatica, right side: Secondary | ICD-10-CM

## 2018-03-08 NOTE — Therapy (Signed)
[facility] [place], [place], [address] Phone: [phone]   Fax:  [phone]  Physical Therapy Treatment  Patient Details  Name: Samuel Gregory MRN: [id number] Date of Birth: [birth year] Referring Provider: Myrtis Ser    Encounter Date: 03/08/2018  PT End of Session - 03/08/18 0914    Visit Number  8    Number of Visits  12    Date for PT Re-Evaluation  03/29/18    Authorization Type  Medicare Part A and [employer];    Authorization Time Period  cert 6/96-->2/95/28    Authorization - Visit Number  8    Authorization - Number of Visits  10    PT Start Time  0907    PT Stop Time  0945    PT Time Calculation (min)  38 min    Activity Tolerance  Patient tolerated treatment well;No increased pain    Behavior During Therapy  WFL for tasks assessed/performed       Past Medical History:  Diagnosis Date  . Arthritis   . Collagen vascular disease ([place])   . Coronary atherosclerosis of native coronary artery    Multivessel status post CABG 2000  . Essential hypertension, benign   . Hyperlipidemia   . Prostate cancer [facility])     Past Surgical History:  Procedure Laterality Date  . APPENDECTOMY     8 yrs ago- Dr Geroge Baseman  . CATARACT EXTRACTION W/PHACO Left 07/14/2014   Procedure: CATARACT EXTRACTION PHACO AND INTRAOCULAR LENS PLACEMENT (IOC);  Surgeon: Tonny Branch, MD;  Location: [facility];  Service: Ophthalmology;  Laterality: Left;  CDE:6.20  . CATARACT EXTRACTION W/PHACO Right 08/11/2014   Procedure: CATARACT EXTRACTION PHACO AND INTRAOCULAR LENS PLACEMENT RIGHT EYE;  Surgeon: Tonny Branch, MD;  Location: [facility];  Service: Ophthalmology;  Laterality: Right;  CDE:10.11  . COLONOSCOPY N/A 01/14/2015   Procedure: COLONOSCOPY;  Surgeon: Rogene Houston, MD;  Location[facility];  Service: Endoscopy;  Laterality: N/A;  730  . CORONARY ARTERY BYPASS GRAFT  01/1999   LIMA to LAD, SVG to diagonal, sequential SVG to PDA and  PLB  . PROST[place]CTOMY  2004   [name]  . TONSILLECTOMY     age 77    There were no vitals filed for this visit.  Subjective Assessment - 03/08/18 0909    Subjective  He reports he is sore today as he did all 10 exercsie for his updated HEP yesterday and walked 20-30 minutes yesterday and this morning as well. He reports yesterday was also his first full day back at work.     Pertinent History  prostate cancer, 5 heart stents, lumbar stenosis with bil leg pain    Limitations  Lifting;Standing;Walking;House hold activities    Patient Stated Goals  to get ready for surgery on 12/19/17    Currently in Pain?  Yes    Pain Score  2     Pain Location  Back    Pain Orientation  Lower    Pain Descriptors / Indicators  Sore    Pain Onset  More than a month ago    Pain Frequency  Constant    Aggravating Factors   activity, walking    Pain Relieving Factors  stretching, medicine        [facility] Adult PT Treatment/Exercise - 03/08/18 0001      Lumbar Exercises: Stretches   Active Hamstring Stretch  3 reps;30 seconds    Single Knee to  Chest Stretch  Right;Left;10 seconds;5 reps    Other Lumbar Stretch Exercise  wall arch; v lift off  x 15       Lumbar Exercises: Standing   Row  Strengthening;Both;15 reps;Theraband    Theraband Level (Row)  Level 3 (Green)    Shoulder Extension  Strengthening;15 reps;Theraband    Theraband Level (Shoulder Extension)  Level 3 (Green)    Other Standing Lumbar Exercises  marching alternating 10x 5"    Other Standing Lumbar Exercises  toe raises on decline, 15 reps bil LE      Lumbar Exercises: Supine   Bridge  10 reps    Bridge Limitations  2 sets      Lumbar Exercises: Sidelying   Hip Abduction  10 reps    Hip Abduction Limitations  rose wall (LE against wall) hip abd and ext      Lumbar Exercises: Prone   Other Prone Lumbar Exercises  shoulder extension and rows: 15 reps each, 3 second holds      Lumbar Exercises: Quadruped   Opposite Arm/Leg  Raise  Right arm/Left leg;Left arm/Right leg;10 reps        PT Education - 03/08/18 0941    Education provided  Yes    Education Details  Educated to reduce HEP to 5 of his 10 exercises each day so that he does not overdo it. Educated on delayed onset muscle soreness and timeline for that to decrease.    Person(s) Educated  Patient    Methods  Explanation    Comprehension  Verbalized understanding       PT Short Term Goals - 03/08/18 0937      PT SHORT TERM GOAL #1   Title  Pt to demonstrate and be able to verbalize the importance of good posture to decrease stress of pt low back to prevent reinjury.    Time  3    Period  Weeks    Status  Achieved      PT SHORT TERM GOAL #2   Title  Pt to be able to demonstrate good body mechanics for lifting and bed mobility to decrease stress of pt low back to prevent reinjury    Time  3    Period  Weeks    Status  On-going      PT SHORT TERM GOAL #3   Title  Pt pain level in his back to be no greater than a 2/10 to be able to walk a 20 minutes mile.     Baseline  03/05/2018: was as high as a 10/10; due to painting the poles yesterday pain is up if not for that pt would say a 2/10    Time  3    Period  Weeks    Status  On-going        PT Long Term Goals - 03/08/18 0940      PT LONG TERM GOAL #1   Title  Pt B LE strength to increase to at least 5-/5 to allow pt to go up and down 2 flights of steps with to pain or difficulty     Time  6    Period  Weeks    Status  Partially Met      PT LONG TERM GOAL #2   Title  PT to be able to walk 2 miles in 35 miutes to return to pre surgery work out routine.     Time  6    Period  Weeks  Status  On-going      PT LONG TERM GOAL #3   Title  PT to be able to return to work with no increased back pain.     Time  6    Period  Weeks    Status  On-going      PT LONG TERM GOAL #4   Title  PT to be able to single leg stance for 30 seconds bilaterally to demonstrate improved core strength and  reduce risk of falling     Time  6    Period  Weeks    Status  On-going        Plan - 03/08/18 0914    Clinical Impression Statement  Continued with prone and standing postural strengthening exercises and progressed opposite arm/leg to quadruped for increased challenge and TrA activation. Patient had no difficulty progressing and required initial tactile cues for form. SLS continued this date with marching on solid surfaces, patient continues to have greater difficulty with Rt LE in SLS. Toe raises were added this date as patient has ongoing weakness with Rt dorsiflexion and reported decreased anterior tibialis endurance with walking. He will continue to benefit from skilled PT interventions to address impairments and educate on body mechanics with lifting to reduce strain/tension in lumbar spine during functional mobility.    Rehab Potential  Good    Clinical Impairments Affecting Rehab Potential  negative - chronicity of condition, amplitude pain increases with too much activity; positive - motivated, pleasant    PT Frequency  2x / week    PT Duration  6 weeks    PT Treatment/Interventions  ADLs/Self Care Home Management;Cryotherapy;Gait training;Stair training;Functional mobility training;Therapeutic activities;Therapeutic exercise;Balance training;Neuromuscular re-education;Patient/family education;Manual techniques;Scar mobilization    PT Next Visit Plan  Continue SLS and progress with vectors; add lunging and lifting mechanics with squat. Cotninue quadruped arm/leg.    PT Home Exercise Plan  sitting tall holding x 10 seconds/ RT Dorisflexion, scapular retraction; cervical retraction; 03/01/18: bridge and standing heel/toe raises    Consulted and Agree with Plan of Care  Patient       Patient will benefit from skilled therapeutic intervention in order to improve the following deficits and impairments:  Pain, Decreased mobility, Postural dysfunction, Decreased activity tolerance, Decreased  range of motion, Difficulty walking, Decreased balance, Decreased strength, Improper body mechanics, Impaired flexibility  Visit Diagnosis: Difficulty in walking, not elsewhere classified  Radiculopathy, lumbar region  Chronic right-sided low back pain with right-sided sciatica  Pain in right thigh     Problem List Patient Active Problem List   Diagnosis Date Noted  . Essential hypertension 01/23/2013  . Coronary atherosclerosis of native coronary artery 01/23/2013  . Mixed hyperlipidemia 01/23/2013    Kipp Brood, PT, DPT Physical Therapist with [facility]  03/08/2018 9:46 AM    [facility] [address] [place], [place], [address] Phone: [phone]   Fax:  [phone]  Name: Samuel Gregory MRN: [id number] Date of Birth: Apr 23, 1941

## 2018-03-13 ENCOUNTER — Encounter (HOSPITAL_COMMUNITY): Payer: Self-pay

## 2018-03-13 ENCOUNTER — Ambulatory Visit (HOSPITAL_COMMUNITY): Payer: Medicare Other

## 2018-03-13 ENCOUNTER — Other Ambulatory Visit: Payer: Self-pay

## 2018-03-13 DIAGNOSIS — M79651 Pain in right thigh: Secondary | ICD-10-CM | POA: Diagnosis not present

## 2018-03-13 DIAGNOSIS — M5441 Lumbago with sciatica, right side: Secondary | ICD-10-CM | POA: Diagnosis not present

## 2018-03-13 DIAGNOSIS — R262 Difficulty in walking, not elsewhere classified: Secondary | ICD-10-CM | POA: Diagnosis not present

## 2018-03-13 DIAGNOSIS — G8929 Other chronic pain: Secondary | ICD-10-CM

## 2018-03-13 DIAGNOSIS — M5416 Radiculopathy, lumbar region: Secondary | ICD-10-CM

## 2018-03-13 NOTE — Therapy (Signed)
[place], [place], [address] Phone: [phone]   Fax:  [phone]  Physical Therapy Treatment  Patient Details  Name: Samuel Gregory MRN: [id number] Date of Birth: 14-Feb-1941 Referring Provider: Myrtis Ser    Encounter Date: 03/13/2018  PT End of Session - 03/13/18 0910    Visit Number  9    Number of Visits  12    Date for PT Re-Evaluation  03/29/18   Minireassess done on visit 7   Authorization Type  Medicare Part A and BCBS Supplement;    Authorization Time Period  cert 0/03-->4/91/79    Authorization - Visit Number  9    Authorization - Number of Visits  10    PT Start Time  0905    PT Stop Time  0944    PT Time Calculation (min)  39 min    Activity Tolerance  Patient tolerated treatment well;No increased pain    Behavior During Therapy  WFL for tasks assessed/performed       Past Medical History:  Diagnosis Date  . Arthritis   . Collagen vascular disease ([place])   . Coronary atherosclerosis of native coronary artery    Multivessel status post CABG 2000  . Essential hypertension, benign   . Hyperlipidemia   . Prostate cancer [facility])     Past Surgical History:  Procedure Laterality Date  . APPENDECTOMY     8 yrs ago- Dr Geroge Baseman  . CATARACT EXTRACTION W/PHACO Left 07/14/2014   Procedure: CATARACT EXTRACTION PHACO AND INTRAOCULAR LENS PLACEMENT (IOC);  Surgeon: Tonny Branch, MD;  Location: [facility];  Service: Ophthalmology;  Laterality: Left;  CDE:6.20  . CATARACT EXTRACTION W/PHACO Right 08/11/2014   Procedure: CATARACT EXTRACTION PHACO AND INTRAOCULAR LENS PLACEMENT RIGHT EYE;  Surgeon: Tonny Branch, MD;  Location: [facility];  Service: Ophthalmology;  Laterality: Right;  CDE:10.11  . COLONOSCOPY N/A 01/14/2015   Procedure: COLONOSCOPY;  Surgeon: Rogene Houston, MD;  Location: [facility];  Service: Endoscopy;  Laterality: N/A;  730  . CORONARY ARTERY BYPASS GRAFT  01/1999   LIMA to LAD, SVG to  diagonal, sequential SVG to PDA and PLB  . [place]OST[place]Y  2004   [name]  . TONSILLECTOMY     age 77    There were no vitals filed for this visit.  Subjective Assessment - 03/13/18 0908    Subjective  Patient reports he is feeling alright today and denies pain in low back but reports he still has some pain into his feet bil.     Pertinent History  prostate cancer, 5 heart stents, lumbar stenosis with bil leg pain    Limitations  Lifting;Standing;Walking;House hold activities    Patient Stated Goals  to get ready for surgery on 12/19/17    Currently in Pain?  No/denies       [facility] Adult PT Treatment/Exercise - 03/13/18 0001      Lumbar Exercises: Stretches   Single Knee to Chest Stretch  Right;Left;10 seconds;5 reps      Lumbar Exercises: Standing   Functional Squats  10 reps    Functional Squats Limitations  chair tapping, verbal cueing for mechanics    Forward Lunge  15 reps;Limitations    Forward Lunge Limitations  bil UE support at // bars, bil LE    Other Standing Lumbar Exercises  SLS with vectors: 3 way, 5 seconds each position      Lumbar Exercises: Seated   Other Seated Lumbar  Exercises  hamstring set against floor, 10 reps bil LE, 5 sec holds      Lumbar Exercises: Prone   Other Prone Lumbar Exercises  shoulder extension and rows: 2x 15 reps each, 3 second holds (1lb weight on 2nd set)      Lumbar Exercises: Quadruped   Opposite Arm/Leg Raise  Right arm/Left leg;Left arm/Right leg;10 reps        PT Education - 03/13/18 0909    Education provided  Yes    Education Details  Educated on plan to re-assess next session to determine readiness to discharge.    Person(s) Educated  Patient    Methods  Explanation    Comprehension  Verbalized understanding       PT Short Term Goals - 03/08/18 0937      PT SHORT TERM GOAL #1   Title  Pt to demonstrate and be able to verbalize the importance of good posture to decrease stress of pt low back to prevent  reinjury.    Time  3    Period  Weeks    Status  Achieved      PT SHORT TERM GOAL #2   Title  Pt to be able to demonstrate good body mechanics for lifting and bed mobility to decrease stress of pt low back to prevent reinjury    Time  3    Period  Weeks    Status  On-going      PT SHORT TERM GOAL #3   Title  Pt pain level in his back to be no greater than a 2/10 to be able to walk a 20 minutes mile.     Baseline  03/05/2018: was as high as a 10/10; due to painting the poles yesterday pain is up if not for that pt would say a 2/10    Time  3    Period  Weeks    Status  On-going        PT Long Term Goals - 03/08/18 0940      PT LONG TERM GOAL #1   Title  Pt B LE strength to increase to at least 5-/5 to allow pt to go up and down 2 flights of steps with to pain or difficulty     Time  6    Period  Weeks    Status  Partially Met      PT LONG TERM GOAL #2   Title  PT to be able to walk 2 miles in 35 miutes to return to pre surgery work out routine.     Time  6    Period  Weeks    Status  On-going      PT LONG TERM GOAL #3   Title  PT to be able to return to work with no increased back pain.     Time  6    Period  Weeks    Status  On-going      PT LONG TERM GOAL #4   Title  PT to be able to single leg stance for 30 seconds bilaterally to demonstrate improved core strength and reduce risk of falling     Time  6    Period  Weeks    Status  On-going       Plan - 03/13/18 0910    Clinical Impression Statement  Continued with functional LE strengthening and postural training this session. Updated HEP with squats and SLS activity for functional strengthening at home. Patient denies  pain throughout session however complained of LE muscle fatigue. He was educated on hamstring isometric as an exercise to address posterior leg pain and foot pain related to his prior nerve compression and intermittent LE pain. He was educated on plan to re-assess progress towards goals next session and  discuss readiness for discharge. He will continue to benefit from skilled PT interventions to address impairments and educate on body mechanics with lifting to reduce strain/tension in lumbar spine during functional mobility.    Rehab Potential  Good    Clinical Impairments Affecting Rehab Potential  negative - chronicity of condition, amplitude pain increases with too much activity; positive - motivated, pleasant    PT Frequency  2x / week    PT Duration  6 weeks    PT Treatment/Interventions  ADLs/Self Care Home Management;Cryotherapy;Gait training;Stair training;Functional mobility training;Therapeutic activities;Therapeutic exercise;Balance training;Neuromuscular re-education;Patient/family education;Manual techniques;Scar mobilization    PT Next Visit Plan  Re-assess next session for discharge.     PT Home Exercise Plan  sitting tall holding x 10 seconds/ RT Dorisflexion, scapular retraction; cervical retraction; 03/01/18: bridge and standing heel/toe raises; 03/13/18 - chair taps/squats, hamstring isometric, SLS with vectors    Consulted and Agree with Plan of Care  Patient       Patient will benefit from skilled therapeutic intervention in order to improve the following deficits and impairments:  Pain, Decreased mobility, Postural dysfunction, Decreased activity tolerance, Decreased range of motion, Difficulty walking, Decreased balance, Decreased strength, Improper body mechanics, Impaired flexibility  Visit Diagnosis: Difficulty in walking, not elsewhere classified  Radiculopathy, lumbar region  Chronic right-sided low back pain with right-sided sciatica  Pain in right thigh     Problem List Patient Active Problem List   Diagnosis Date Noted  . Essential hypertension 01/23/2013  . Coronary atherosclerosis of native coronary artery 01/23/2013  . Mixed hyperlipidemia 01/23/2013     Kipp Brood, PT, DPT Physical Therapist with [facility]  03/13/2018 12:17 PM     [place], [place], [address] Phone: [phone]   Fax:  [phone]  Name: Samuel Gregory MRN: [id number] Date of Birth: [birth year]

## 2018-03-15 ENCOUNTER — Ambulatory Visit (HOSPITAL_COMMUNITY): Payer: Medicare Other

## 2018-03-15 DIAGNOSIS — G8929 Other chronic pain: Secondary | ICD-10-CM

## 2018-03-15 DIAGNOSIS — M5416 Radiculopathy, lumbar region: Secondary | ICD-10-CM

## 2018-03-15 DIAGNOSIS — M79651 Pain in right thigh: Secondary | ICD-10-CM | POA: Diagnosis not present

## 2018-03-15 DIAGNOSIS — M5441 Lumbago with sciatica, right side: Secondary | ICD-10-CM

## 2018-03-15 DIAGNOSIS — R262 Difficulty in walking, not elsewhere classified: Secondary | ICD-10-CM

## 2018-03-15 NOTE — Patient Instructions (Signed)
    BODY MECHANICS - WAIST HEIGHT LIFTING  Start by standing close to the object with feet spread apart. Bend at the knees and hips and NOT at your spine.   Hold the object close to your body as you use your legs muscles to stand back up lifting the object.   Walk over to the surface you want to set the object on to and set it down. Be sure to NOT twist your spine but to pivot your feet so that your feet are pointed forward to where you want to set the object.   Slide the object on the shelf to off load your body.

## 2018-03-15 NOTE — Therapy (Signed)
[place], [place], [address] Phone: [phone]   Fax:  [phone]  Physical Therapy Treatment/Discharge Summary  Patient Details  Name: Samuel Gregory MRN: [id number] Date of Birth: 24-May-1941 Referring Provider: Marzetta Board   Encounter Date: 03/15/2018   PHYSICAL THERAPY DISCHARGE SUMMARY  Visits from Start of Care: 10  Current functional level related to goals / functional outcomes: Re-assessment performed today and patient has met all goals with exception of SLS balance on Rt LE. He has improved balance significantly from 2 seconds to 15 however was unable to hold balance for 30 seconds. He has demonstrated compliance with HEP and proper lifting mechanics this session and has made significant improvement in self reported function and decreased limitation based on FOTO. He has reported feeling confident in being able to continue with exercises and progress independently and will be discharged following this session.   Remaining deficits: See below details.   Education / Equipment: Educated on progress towards goals and readiness for discharge. Educated on appropriate continuation of HEP to maintain current functional status. Provided handout on lifting mechanics.  Plan: Patient agrees to discharge.  Patient goals were met. Patient is being discharged due to meeting the stated rehab goals.  ?????       PT End of Session - 03/15/18 0902    Visit Number  10    Number of Visits  12    Date for PT Re-Evaluation  03/29/18   Minireassess done on visit 7   Authorization Type  Medicare Part A and [place];    Authorization Time Period  cert 6/29-->4/76/54    Authorization - Visit Number  10    Authorization - Number of Visits  10    PT Start Time  6503   pt arrived late   PT Stop Time  0938    PT Time Calculation (min)  25 min    Activity Tolerance  Patient tolerated treatment well;No  increased pain    Behavior During Therapy  WFL for tasks assessed/performed       Past Medical History:  Diagnosis Date  . Arthritis   . Collagen vascular disease ([place])   . Coronary atherosclerosis of native coronary artery    Multivessel status post CABG 2000  . Essential hypertension, benign   . Hyperlipidemia   . Prostate cancer [facility])     Past Surgical History:  Procedure Laterality Date  . APPENDECTOMY     8 yrs ago- Dr Geroge Baseman  . CATARACT EXTRACTION W/PHACO Left 07/14/2014   Procedure: CATARACT EXTRACTION PHACO AND INTRAOCULAR LENS PLACEMENT (IOC);  Surgeon: Tonny Branch, MD;  Location: [facility];  Service: Ophthalmology;  Laterality: Left;  CDE:6.20  . CATARACT EXTRACTION W/PHACO Right 08/11/2014   Procedure: CATARACT EXTRACTION PHACO AND INTRAOCULAR LENS PLACEMENT RIGHT EYE;  Surgeon: Tonny Branch, MD;  Location: [facility];  Service: Ophthalmology;  Laterality: Right;  CDE:10.11  . COLONOSCOPY N/A 01/14/2015   Procedure: COLONOSCOPY;  Surgeon: Rogene Houston, MD;  Location: [facility];  Service: Endoscopy;  Laterality: N/A;  730  . CORONARY ARTERY BYPASS GRAFT  01/1999   LIMA to LAD, SVG to diagonal, sequential SVG to PDA and PLB  . [place]  2004   [name]  . TONSILLECTOMY     age 77    There were no vitals filed for this visit.  Subjective Assessment - 03/15/18 0917    Subjective  Patient reports feeling tired from Tuesday's  sesion and states his quads are the most sore. He reports he did his balance exercise yesterday with no difficulty and feels ready to finish physical therapy.    Pertinent History  prostate cancer, 5 heart stents, lumbar stenosis with bil leg pain    Limitations  Lifting;Standing;Walking;House hold activities    Patient Stated Goals  to get ready for surgery on 12/19/17    Currently in Pain?  No/denies         [facility] PT Assessment - 03/15/18 0001      Assessment   Medical Diagnosis  lumbar stenosis    Referring Provider  Marzetta Board    Onset Date/Surgical Date  12/19/17    Next MD Visit  End of August    Prior Therapy  yes      Precautions   Precautions  None      Restrictions   Weight Bearing Restrictions  No      Balance Screen   Has the patient fallen in the past 6 months  No    Has the patient had a decrease in activity level because of a fear of falling?   No    Is the patient reluctant to leave their home because of a fear of falling?   No      Prior Function   Level of Independence  Independent    Vocation  Part time employment    Programmer, [employer]   Overall Cognitive Status  Within Functional Limits for tasks assessed      Observation/Other Assessments   Focus on Therapeutic Outcomes (FOTO)   30% limited   56% limited on 02/13/18     Functional Tests   Functional tests  Single leg stance;Other      Single Leg Stance   Comments  Lt LE = 30 sec, Rt LE = 15 sec      Other:   Other/ Comments  Lifting mehcanics: 10x floor to waist lifting with unweighted box and with 15lb weighted box; patient with good mechanics and squat depth      Strength   Right Hip Flexion  5/5    Right Hip Extension  5/5    Right Hip ABduction  5/5    Left Hip Flexion  5/5    Left Hip Extension  5/5    Left Hip ABduction  5/5    Right Knee Flexion  5/5    Right Knee Extension  5/5    Left Knee Flexion  5/5    Left Knee Extension  5/5    Right Ankle Dorsiflexion  4/5    Left Ankle Dorsiflexion  5/5      Ambulation/Gait   Stairs  Yes    Stairs Assistance  7: Independent    Stair Management Technique  One rail Left;Forwards;Alternating pattern        PT Education - 03/15/18 0940    Education provided  Yes    Education Details  Educated on progress towards goals and readiness for discharge. Educated on appropriate continuation of HEP to maintain current functional status. Provided handout on lifting mechanics.    Person(s) Educated  Patient    Methods   Explanation;Handout    Comprehension  Verbalized understanding;Returned demonstration       PT Short Term Goals - 03/15/18 0914      PT SHORT TERM GOAL #1   Title  Pt to demonstrate and be able  to verbalize the importance of good posture to decrease stress of pt low back to prevent reinjury.    Time  3    Period  Weeks    Status  Achieved      PT SHORT TERM GOAL #2   Title  Pt to be able to demonstrate good body mechanics for lifting and bed mobility to decrease stress of pt low back to prevent reinjury    Time  3    Period  Weeks    Status  On-going      PT SHORT TERM GOAL #3   Title  Pt pain level in his back to be no greater than a 2/10 to be able to walk a 20 minutes mile.     Baseline  03/05/2018: was as high as a 10/10; due to painting the poles yesterday pain is up if not for that pt would say a 2/10    Time  3    Period  Weeks    Status  Achieved        PT Long Term Goals - 03/15/18 0914      PT LONG TERM GOAL #1   Title  Pt B LE strength to increase to at least 5-/5 to allow pt to go up and down 2 flights of steps with to pain or difficulty     Baseline  Rt dorsiflexion remains limited    Time  6    Period  Weeks    Status  Partially Met      PT LONG TERM GOAL #2   Title  PT to be able to walk 2 miles in 35 miutes to return to pre surgery work out routine.     Time  6    Period  Weeks    Status  Achieved      PT LONG TERM GOAL #3   Title  PT to be able to return to work with no increased back pain.     Time  6    Period  Weeks    Status  Achieved      PT LONG TERM GOAL #4   Title  PT to be able to single leg stance for 30 seconds bilaterally to demonstrate improved core strength and reduce risk of falling     Baseline  Rt LE = 15; Lt LE = 30    Time  6    Period  Weeks    Status  Partially Met        Plan - 03/15/18 0913    Clinical Impression Statement  Re-assessment performed today and patient has met all goals with exception of SLS balance on Rt  LE. He has improved balance significantly from 2 seconds to 15 however was unable to hold balance for 30 seconds. He has demonstrated compliance with HEP and proper lifting mechanics this session and has made significant improvement in self reported function and decreased limitation based on FOTO. He has reported feeling confident in being able to continue with exercises and progress independently and will be discharged following this session.    Rehab Potential  Good    Clinical Impairments Affecting Rehab Potential  negative - chronicity of condition, amplitude pain increases with too much activity; positive - motivated, pleasant    PT Frequency  2x / week    PT Duration  6 weeks    PT Treatment/Interventions  ADLs/Self Care Home Management;Cryotherapy;Gait training;Stair training;Functional mobility training;Therapeutic activities;Therapeutic exercise;Balance training;Neuromuscular re-education;Patient/family education;Manual techniques;Scar  mobilization    PT Next Visit Plan  Discharge    PT Home Exercise Plan  sitting tall holding x 10 seconds/ RT Dorisflexion, scapular retraction; cervical retraction; 03/01/18: bridge and standing heel/toe raises; 03/13/18 - chair taps/squats, hamstring isometric, SLS with vectors    Consulted and Agree with Plan of Care  Patient       Patient will benefit from skilled therapeutic intervention in order to improve the following deficits and impairments:  Pain, Decreased mobility, Postural dysfunction, Decreased activity tolerance, Decreased range of motion, Difficulty walking, Decreased balance, Decreased strength, Improper body mechanics, Impaired flexibility  Visit Diagnosis: Difficulty in walking, not elsewhere classified  Radiculopathy, lumbar region  Chronic right-sided low back pain with right-sided sciatica  Pain in right thigh     Problem List Patient Active Problem List   Diagnosis Date Noted  . Essential hypertension 01/23/2013  . Coronary  atherosclerosis of native coronary artery 01/23/2013  . Mixed hyperlipidemia 01/23/2013    Samuel Gregory, PT, DPT Physical Therapist with [facility]  03/15/2018 9:50 AM    [place], [place], [address] Phone: [phone]   Fax:  [phone]  Name: Samuel Gregory MRN: [id number] Date of Birth: 1940-11-25

## 2018-03-19 ENCOUNTER — Other Ambulatory Visit: Payer: Self-pay | Admitting: Cardiology

## 2018-03-20 ENCOUNTER — Encounter (HOSPITAL_COMMUNITY): Payer: Medicare Other

## 2018-03-22 ENCOUNTER — Encounter (HOSPITAL_COMMUNITY): Payer: Medicare Other

## 2018-03-28 DIAGNOSIS — M4807 Spinal stenosis, lumbosacral region: Secondary | ICD-10-CM | POA: Diagnosis not present

## 2018-03-28 DIAGNOSIS — M48062 Spinal stenosis, lumbar region with neurogenic claudication: Secondary | ICD-10-CM | POA: Diagnosis not present

## 2018-04-13 DIAGNOSIS — C61 Malignant neoplasm of prostate: Secondary | ICD-10-CM | POA: Diagnosis not present

## 2018-04-13 DIAGNOSIS — Z79899 Other long term (current) drug therapy: Secondary | ICD-10-CM | POA: Diagnosis not present

## 2018-04-13 DIAGNOSIS — Z79818 Long term (current) use of other agents affecting estrogen receptors and estrogen levels: Secondary | ICD-10-CM | POA: Diagnosis not present

## 2018-04-13 DIAGNOSIS — C7951 Secondary malignant neoplasm of bone: Secondary | ICD-10-CM | POA: Diagnosis not present

## 2018-05-14 ENCOUNTER — Ambulatory Visit (INDEPENDENT_AMBULATORY_CARE_PROVIDER_SITE_OTHER): Payer: Medicare Other | Admitting: Otolaryngology

## 2018-05-14 DIAGNOSIS — H838X3 Other specified diseases of inner ear, bilateral: Secondary | ICD-10-CM

## 2018-05-14 DIAGNOSIS — H903 Sensorineural hearing loss, bilateral: Secondary | ICD-10-CM | POA: Diagnosis not present

## 2018-05-14 DIAGNOSIS — H6123 Impacted cerumen, bilateral: Secondary | ICD-10-CM

## 2018-05-16 ENCOUNTER — Ambulatory Visit (INDEPENDENT_AMBULATORY_CARE_PROVIDER_SITE_OTHER): Payer: Medicare Other | Admitting: Urology

## 2018-05-16 DIAGNOSIS — N3281 Overactive bladder: Secondary | ICD-10-CM

## 2018-05-16 DIAGNOSIS — R351 Nocturia: Secondary | ICD-10-CM | POA: Diagnosis not present

## 2018-05-16 DIAGNOSIS — C61 Malignant neoplasm of prostate: Secondary | ICD-10-CM

## 2018-05-24 DIAGNOSIS — H02055 Trichiasis without entropian left lower eyelid: Secondary | ICD-10-CM | POA: Diagnosis not present

## 2018-05-30 DIAGNOSIS — Z23 Encounter for immunization: Secondary | ICD-10-CM | POA: Diagnosis not present

## 2018-07-18 DIAGNOSIS — C44219 Basal cell carcinoma of skin of left ear and external auricular canal: Secondary | ICD-10-CM | POA: Diagnosis not present

## 2018-07-18 DIAGNOSIS — L820 Inflamed seborrheic keratosis: Secondary | ICD-10-CM | POA: Diagnosis not present

## 2018-07-18 DIAGNOSIS — Z08 Encounter for follow-up examination after completed treatment for malignant neoplasm: Secondary | ICD-10-CM | POA: Diagnosis not present

## 2018-07-18 DIAGNOSIS — L570 Actinic keratosis: Secondary | ICD-10-CM | POA: Diagnosis not present

## 2018-07-18 DIAGNOSIS — Z85828 Personal history of other malignant neoplasm of skin: Secondary | ICD-10-CM | POA: Diagnosis not present

## 2018-07-18 DIAGNOSIS — D225 Melanocytic nevi of trunk: Secondary | ICD-10-CM | POA: Diagnosis not present

## 2018-07-18 DIAGNOSIS — X32XXXD Exposure to sunlight, subsequent encounter: Secondary | ICD-10-CM | POA: Diagnosis not present

## 2018-08-01 DEATH — deceased

## 2018-08-02 ENCOUNTER — Other Ambulatory Visit: Payer: Self-pay | Admitting: Cardiology

## 2018-08-06 ENCOUNTER — Other Ambulatory Visit: Payer: Self-pay | Admitting: Cardiology

## 2018-08-07 DIAGNOSIS — Z79818 Long term (current) use of other agents affecting estrogen receptors and estrogen levels: Secondary | ICD-10-CM | POA: Diagnosis not present

## 2018-08-07 DIAGNOSIS — Z79899 Other long term (current) drug therapy: Secondary | ICD-10-CM | POA: Diagnosis not present

## 2018-08-07 DIAGNOSIS — C7951 Secondary malignant neoplasm of bone: Secondary | ICD-10-CM | POA: Diagnosis not present

## 2018-08-07 DIAGNOSIS — R232 Flushing: Secondary | ICD-10-CM | POA: Diagnosis not present

## 2018-08-07 DIAGNOSIS — C61 Malignant neoplasm of prostate: Secondary | ICD-10-CM | POA: Diagnosis not present

## 2018-08-08 DIAGNOSIS — M4807 Spinal stenosis, lumbosacral region: Secondary | ICD-10-CM | POA: Diagnosis not present

## 2018-08-08 DIAGNOSIS — M5416 Radiculopathy, lumbar region: Secondary | ICD-10-CM | POA: Diagnosis not present

## 2018-08-08 DIAGNOSIS — M48061 Spinal stenosis, lumbar region without neurogenic claudication: Secondary | ICD-10-CM | POA: Diagnosis not present

## 2018-08-20 DIAGNOSIS — L570 Actinic keratosis: Secondary | ICD-10-CM | POA: Diagnosis not present

## 2018-08-20 DIAGNOSIS — Z85828 Personal history of other malignant neoplasm of skin: Secondary | ICD-10-CM | POA: Diagnosis not present

## 2018-08-20 DIAGNOSIS — L820 Inflamed seborrheic keratosis: Secondary | ICD-10-CM | POA: Diagnosis not present

## 2018-08-20 DIAGNOSIS — X32XXXD Exposure to sunlight, subsequent encounter: Secondary | ICD-10-CM | POA: Diagnosis not present

## 2018-08-20 DIAGNOSIS — Z08 Encounter for follow-up examination after completed treatment for malignant neoplasm: Secondary | ICD-10-CM | POA: Diagnosis not present

## 2018-08-29 ENCOUNTER — Other Ambulatory Visit: Payer: Self-pay | Admitting: Cardiology

## 2018-09-01 DEATH — deceased

## 2018-10-16 ENCOUNTER — Other Ambulatory Visit: Payer: Self-pay | Admitting: Cardiology

## 2018-11-13 DIAGNOSIS — C61 Malignant neoplasm of prostate: Secondary | ICD-10-CM | POA: Diagnosis not present

## 2018-11-21 ENCOUNTER — Ambulatory Visit (INDEPENDENT_AMBULATORY_CARE_PROVIDER_SITE_OTHER): Payer: Medicare Other | Admitting: Urology

## 2018-11-21 DIAGNOSIS — C61 Malignant neoplasm of prostate: Secondary | ICD-10-CM | POA: Diagnosis not present

## 2018-11-21 DIAGNOSIS — N3281 Overactive bladder: Secondary | ICD-10-CM | POA: Diagnosis not present

## 2018-11-21 DIAGNOSIS — R351 Nocturia: Secondary | ICD-10-CM | POA: Diagnosis not present

## 2018-11-27 DIAGNOSIS — E875 Hyperkalemia: Secondary | ICD-10-CM | POA: Diagnosis not present

## 2018-11-27 DIAGNOSIS — R7301 Impaired fasting glucose: Secondary | ICD-10-CM | POA: Diagnosis not present

## 2018-11-27 DIAGNOSIS — I2510 Atherosclerotic heart disease of native coronary artery without angina pectoris: Secondary | ICD-10-CM | POA: Diagnosis not present

## 2018-11-27 DIAGNOSIS — Z8546 Personal history of malignant neoplasm of prostate: Secondary | ICD-10-CM | POA: Diagnosis not present

## 2018-11-27 DIAGNOSIS — I25810 Atherosclerosis of coronary artery bypass graft(s) without angina pectoris: Secondary | ICD-10-CM | POA: Diagnosis not present

## 2018-11-27 DIAGNOSIS — D509 Iron deficiency anemia, unspecified: Secondary | ICD-10-CM | POA: Diagnosis not present

## 2018-11-27 DIAGNOSIS — E782 Mixed hyperlipidemia: Secondary | ICD-10-CM | POA: Diagnosis not present

## 2018-11-30 DIAGNOSIS — R7301 Impaired fasting glucose: Secondary | ICD-10-CM | POA: Diagnosis not present

## 2018-11-30 DIAGNOSIS — E875 Hyperkalemia: Secondary | ICD-10-CM | POA: Diagnosis not present

## 2018-11-30 DIAGNOSIS — D509 Iron deficiency anemia, unspecified: Secondary | ICD-10-CM | POA: Diagnosis not present

## 2018-11-30 DIAGNOSIS — E782 Mixed hyperlipidemia: Secondary | ICD-10-CM | POA: Diagnosis not present

## 2018-12-05 ENCOUNTER — Ambulatory Visit: Payer: Medicare Other | Admitting: Cardiology

## 2018-12-05 ENCOUNTER — Telehealth: Payer: Self-pay | Admitting: Cardiology

## 2018-12-05 NOTE — Progress Notes (Signed)
Virtual Visit via Video Note   This visit type was conducted due to national recommendations for restrictions regarding the COVID-19 Pandemic (e.g. social distancing) in an effort to limit this patient's exposure and mitigate transmission in our community.  Due to his co-morbid illnesses, this patient is at least at moderate risk for complications without adequate follow up.  This format is felt to be most appropriate for this patient at this time.  All issues noted in this document were discussed and addressed.  A limited physical exam was performed with this format.  Please refer to the patient's chart for his consent to telehealth for [facility].   Date:  12/06/2018   ID:  Samuel Gregory, DOB 01-28-1941, MRN [id number]  Patient Location: Home Provider Location: Office  PCP:  Celene Squibb, MD  Cardiologist: Satira Sark, MD  Evaluation Performed:  Follow-Up Visit  Chief Complaint:  Cardiac follow-up  History of Present Illness:    Samuel Gregory is a 78 y.o. male last seen in May 2019.  We communicated via video conferencing today.  He does not report any significant angina symptoms at this time.  NYHA class II dyspnea with typical activities, no palpitations or syncope.  He continues to follow at [facility] with history of anterior posterior lumbar fusion, was seen in January of this year.  He has been doing some stretching exercises and states that his back feels much better.  Myoview from April 2019 is outlined below, we have been managing him medically.  I reviewed his current medications which are listed below.  We discussed reducing aspirin to 81 mg daily, also calling in a prescription for as needed nitroglycerin which he did not have.  He still works 25 hours a week with his [employer].  [place]  The patient does not have symptoms concerning for COVID-19 infection (fever, chills, cough, or new shortness of breath).    Past  Medical History:  Diagnosis Date  . Arthritis   . Collagen vascular disease ([place])   . Coronary atherosclerosis of native coronary artery    Multivessel status post CABG 2000  . Essential hypertension, benign   . Hyperlipidemia   . Prostate cancer [facility])    Past Surgical History:  Procedure Laterality Date  . APPENDECTOMY     8 yrs ago- Dr Geroge Baseman  . CATARACT EXTRACTION W/PHACO Left 07/14/2014   Procedure: CATARACT EXTRACTION PHACO AND INTRAOCULAR LENS PLACEMENT (IOC);  Surgeon: Tonny Branch, MD;  Location: [facility] ORS;  Service: Ophthalmology;  Laterality: Left;  CDE:6.20  . CATARACT EXTRACTION W/PHACO Right 08/11/2014   Procedure: CATARACT EXTRACTION PHACO AND INTRAOCULAR LENS PLACEMENT RIGHT EYE;  Surgeon: Tonny Branch, MD;  Location: [facility] ORS;  Service: Ophthalmology;  Laterality: Right;  CDE:10.11  . COLONOSCOPY N/A 01/14/2015   Procedure: COLONOSCOPY;  Surgeon: Rogene Houston, MD;  Location: [facility] ENDO SUITE;  Service: Endoscopy;  Laterality: N/A;  730  . CORONARY ARTERY BYPASS GRAFT  01/1999   LIMA to LAD, SVG to diagonal, sequential SVG to PDA and PLB  . PROST[place]CTOMY  2004   [name]  . TONSILLECTOMY     age 87     Current Meds  Medication Sig  . Calcium-Magnesium-Vitamin D (CALCIUM 1200+D3 PO) Take 1 tablet by mouth daily.   . fluticasone (FLONASE) 50 MCG/ACT nasal spray Place 1 spray into both nostrils daily as needed for allergies or rhinitis.  . metoprolol succinate (TOPROL-XL) 25 MG  24 hr tablet TAKE (1) TABLET BY MOUTH DAILY OR AS OTHERWISE DIRECTED.  . Multiple Vitamin (MULTIVITAMIN WITH MINERALS) TABS Take 1 tablet by mouth daily.  . naproxen sodium (ANAPROX) 220 MG tablet Take 220 mg by mouth daily as needed (pain).  . predniSONE (DELTASONE) 5 MG tablet Take 1 tablet by mouth 2 (two) times daily.  . ramipril (ALTACE) 5 MG capsule TAKE (1) CAPSULE BY MOUTH ONCE DAILY.  . rosuvastatin (CRESTOR) 20 MG tablet TAKE 1 TABLET BY MOUTH DAILY.  Marland Kitchen Thiamine HCl (VITAMIN B-1 PO)  Take 1 tablet by mouth daily.  Marland Kitchen ZYTIGA 250 MG tablet Take 4 tablets by mouth daily.  . [DISCONTINUED] aspirin 325 MG tablet Take 325 mg by mouth daily.     Allergies:   Latex and Sulfa antibiotics   Social History   Tobacco Use  . Smoking status: Never Smoker  . Smokeless tobacco: Never Used  Substance Use Topics  . Alcohol use: Yes    Alcohol/week: 0.0 standard drinks    Comment: daily-glass of wine  . Drug use: No     Family Hx: The patient's family history includes Aneurysm in his father; Cancer in his mother; Heart attack in his mother.  ROS:   Please see the history of present illness.    All other systems reviewed and are negative.   Prior CV studies:   The following studies were reviewed today:  Lexiscan Myoview 11/07/2017:  No diagnostic ST segment changes to indicate ischemia.  Small, moderate intensity, inferior septal defect that is reversible at the apex and partially reversible in the mid to basal segment. This is consistent with scar with mild to moderate peri-infarct ischemia.  This is an intermediate risk study.  Nuclear stress EF: 62%.  Labs/Other Tests and Data Reviewed:    EKG:  An ECG dated 01/11/2016 was personally reviewed today and demonstrated:  Normal sinus rhythm.  Recent Labs:  June 2018: Hemoglobin 13.5, platelets 191, BUN 24, creatinine 1.11, potassium 4.4, AST 22, ALT 17, cholesterol 202, triglycerides 83, HDL 74, LDL 111 January 2020: Hemoglobin 13.4, platelets 198, potassium 4.8, BUN 26, creatinine 1.3, AST 21, ALT 17  Wt Readings from Last 3 Encounters:  12/06/18 165 lb (74.8 kg)  12/01/17 163 lb (73.9 kg)  08/24/17 160 lb (72.6 kg)     Objective:    Vital Signs:  BP 129/80   Pulse 64   Ht 5\' 6"  (1.676 m)   Wt 165 lb (74.8 kg)   BMI 26.63 kg/m    General: Elderly male, appears comfortable seated in his home. HEENT: Conjunctiva and lids normal. Skin: Color and turgor appear normal. Lungs: Nonlabored breathing while  speaking in full sentences.  No audible wheezing. Neuropsychiatric: Gaze conjugate.  Voice tone and speech pattern normal.  Moves all extremities.  Affect appropriate.  ASSESSMENT & PLAN:    1.  Multivessel CAD status post CABG in 2000.  Last ischemic testing revealed evidence of inferior septal scar with mild to moderate peri-infarct ischemia and normal LVEF.  He does not report any angina symptoms on medical therapy and we will continue with observation.  Reduce aspirin to 81 mg daily.  Prescription given for as needed nitroglycerin.  2.  Mixed hyperlipidemia, he continues on Crestor with follow-up per Dr. [place] Crane.  3.  Essential hypertension, blood pressure control is adequate today.  No changes made in current regimen.  COVID-19 Education: The signs and symptoms of COVID-19 were discussed with the patient and how to seek  care for testing (follow up with PCP or arrange E-visit).  The importance of social distancing was discussed today.  Time:   Today, I have spent 12 minutes with the patient with telehealth technology discussing the above problems.     Medication Adjustments/Labs and Tests Ordered: Current medicines are reviewed at length with the patient today.  Concerns regarding medicines are outlined above.   Tests Ordered: No orders of the defined types were placed in this encounter.   Medication Changes: Meds ordered this encounter  Medications  . aspirin EC 81 MG tablet    Sig: Take 1 tablet (81 mg total) by mouth daily.    Dispense:  90 tablet    Refill:  3    12/06/2018 dose decrease  . nitroGLYCERIN (NITROSTAT) 0.4 MG SL tablet    Sig: Place 1 tablet (0.4 mg total) under the tongue every 5 (five) minutes x 3 doses as needed for chest pain (if no relief after 3rd dose, proceed to the ED for an evaluation or call 911).    Dispense:  25 tablet    Refill:  3    Disposition:  Follow up 1 year in the office.  Signed, Rozann Lesches, MD  12/06/2018 2:40 PM    [facility]

## 2018-12-05 NOTE — Telephone Encounter (Signed)
Virtual Visit Pre-Appointment Phone Call  "(Name), I am calling you today to discuss your upcoming appointment. We are currently trying to limit exposure to the virus that causes COVID-19 by seeing patients at home rather than in the office."  1. "What is the BEST phone number to call the day of the visit?" - include this in appointment notes  2. Do you have or have access to (through a family member/friend) a smartphone with video capability that we can use for your visit?" a. If yes - list this number in appt notes as cell (if different from BEST phone #) and list the appointment type as a VIDEO visit in appointment notes b. If no - list the appointment type as a PHONE visit in appointment notes  Confirm consent - "In the setting of the current Covid19 crisis, you are scheduled for a (phone or video) visit with your provider on (date) at (time).  Just as we do with many in-office visits, in order for you to participate in this visit, we must obtain consent.  If you'd like, I can send this to your mychart (if signed up) or email for you to review.  Otherwise, I can obtain your verbal consent now.  All virtual visits are billed to your insurance company just like a normal visit would be.  By agreeing to a virtual visit, we'd like you to understand that the technology does not allow for your provider to perform an examination, and thus may limit your provider's ability to fully assess your condition. If your provider identifies any concerns that need to be evaluated in person, we will make arrangements to do so.  Finally, though the technology is pretty good, we cannot assure that it will always work on either your or our end, and in the setting of a video visit, we may have to convert it to a phone-only visit.  In either situation, we cannot ensure that we have a secure connection.  Are you willing to proceed?" STAFF: Did the patient verbally acknowledge consent to telehealth visit? Document  YES/NO here:Yes  3. Advise patient to be prepared - "Two hours prior to your appointment, go ahead and check your blood pressure, pulse, oxygen saturation, and your weight (if you have the equipment to check those) and write them all down. When your visit starts, your provider will ask you for this information. If you have an Apple Watch or Kardia device, please plan to have heart rate information ready on the day of your appointment. Please have a pen and paper handy nearby the day of the visit as well."  4. Give patient instructions for MyChart download to smartphone OR [email or web address] as below if video visit (depending on what platform provider is using)  5. Inform patient they will receive a phone call 15 minutes prior to their appointment time (may be from unknown caller ID) so they should be prepared to answer    TELEPHONE CALL NOTE  Samuel Gregory has been deemed a candidate for a follow-up tele-health visit to limit community exposure during the Covid-19 pandemic. I spoke with the patient via phone to ensure availability of phone/video source, confirm preferred email & phone number, and discuss instructions and expectations.  I reminded Samuel Gregory to be prepared with any vital sign and/or heart rhythm information that could potentially be obtained via home monitoring, at the time of his visit. I reminded Samuel Gregory to expect a phone call prior to his visit.  Orinda Kenner 12/05/2018 9:39 AM

## 2018-12-06 ENCOUNTER — Encounter: Payer: Self-pay | Admitting: Cardiology

## 2018-12-06 ENCOUNTER — Telehealth (INDEPENDENT_AMBULATORY_CARE_PROVIDER_SITE_OTHER): Payer: Medicare Other | Admitting: Cardiology

## 2018-12-06 VITALS — BP 129/80 | HR 64 | Ht 66.0 in | Wt 165.0 lb

## 2018-12-06 DIAGNOSIS — Z7189 Other specified counseling: Secondary | ICD-10-CM | POA: Diagnosis not present

## 2018-12-06 DIAGNOSIS — E782 Mixed hyperlipidemia: Secondary | ICD-10-CM

## 2018-12-06 DIAGNOSIS — I25119 Atherosclerotic heart disease of native coronary artery with unspecified angina pectoris: Secondary | ICD-10-CM | POA: Diagnosis not present

## 2018-12-06 DIAGNOSIS — I10 Essential (primary) hypertension: Secondary | ICD-10-CM

## 2018-12-06 MED ORDER — NITROGLYCERIN 0.4 MG SL SUBL: 0.4000 mg | SUBLINGUAL_TABLET | SUBLINGUAL | 3 refills | Status: DC | PRN

## 2018-12-06 MED ORDER — ASPIRIN EC 81 MG PO TBEC: 81.0000 mg | DELAYED_RELEASE_TABLET | Freq: Every day | ORAL | 3 refills | Status: DC

## 2018-12-06 NOTE — Patient Instructions (Addendum)
Medication Instructions:   Your physician has recommended you make the following change in your medication:   Decrease aspirin to 81 mg by mouth daily  Nitroglycerin 0.4 mg sublingual tablets one every 5 minutes up to 3 doses. If no relief after 3rd dose, proceed to the ED or call 911  Other medications remain the same  Labwork:  NONE  Testing/Procedures:  NONE  Follow-Up:  Your physician recommends that you schedule a follow-up appointment in: 1 year. You will receive a reminder letter in the mail in about 10 months reminding you to call and schedule your appointment. If you don't receive this letter, please contact our office.  Any Other Special Instructions Will Be Listed Below (If Applicable).  If you need a refill on your cardiac medications before your next appointment, please call your pharmacy.

## 2018-12-25 ENCOUNTER — Other Ambulatory Visit: Payer: Self-pay

## 2018-12-25 ENCOUNTER — Ambulatory Visit (INDEPENDENT_AMBULATORY_CARE_PROVIDER_SITE_OTHER): Payer: Medicare Other | Admitting: Orthopaedic Surgery

## 2018-12-25 ENCOUNTER — Encounter: Payer: Self-pay | Admitting: Orthopaedic Surgery

## 2018-12-25 VITALS — Temp 97.3°F

## 2018-12-25 DIAGNOSIS — M25511 Pain in right shoulder: Secondary | ICD-10-CM

## 2018-12-25 DIAGNOSIS — G8929 Other chronic pain: Secondary | ICD-10-CM

## 2018-12-25 DIAGNOSIS — C7951 Secondary malignant neoplasm of bone: Secondary | ICD-10-CM | POA: Insufficient documentation

## 2018-12-25 DIAGNOSIS — E291 Testicular hypofunction: Secondary | ICD-10-CM | POA: Insufficient documentation

## 2018-12-25 DIAGNOSIS — C61 Malignant neoplasm of prostate: Secondary | ICD-10-CM | POA: Insufficient documentation

## 2018-12-25 NOTE — Progress Notes (Signed)
PROCEDURE NOTE:  The patient request injection, verbal consent was obtained.  The right shoulder was prepped appropriately after time out was performed.   Sterile technique was observed and injection of 1 cc of Depo-Medrol 40 mg with several cc's of plain xylocaine. Anesthesia was provided by ethyl chloride and a 20-gauge needle was used to inject the shoulder area. A posterior approach was used.  The injection was tolerated well.  A band aid dressing was applied.  The patient was advised to apply ice later today and tomorrow to the injection sight as needed.  I will see as needed.  Call if any problem.  Precautions discussed.   Electronically Signed Sanjuana Kava, MD 5/26/202010:27 AM

## 2019-01-21 DIAGNOSIS — C61 Malignant neoplasm of prostate: Secondary | ICD-10-CM | POA: Diagnosis not present

## 2019-01-21 DIAGNOSIS — Z79899 Other long term (current) drug therapy: Secondary | ICD-10-CM | POA: Diagnosis not present

## 2019-01-21 DIAGNOSIS — Z79818 Long term (current) use of other agents affecting estrogen receptors and estrogen levels: Secondary | ICD-10-CM | POA: Diagnosis not present

## 2019-01-21 DIAGNOSIS — C7951 Secondary malignant neoplasm of bone: Secondary | ICD-10-CM | POA: Diagnosis not present

## 2019-01-23 DIAGNOSIS — C61 Malignant neoplasm of prostate: Secondary | ICD-10-CM | POA: Diagnosis not present

## 2019-02-27 ENCOUNTER — Other Ambulatory Visit: Payer: Self-pay | Admitting: Cardiology

## 2019-03-15 DIAGNOSIS — R7301 Impaired fasting glucose: Secondary | ICD-10-CM | POA: Diagnosis not present

## 2019-03-15 DIAGNOSIS — D509 Iron deficiency anemia, unspecified: Secondary | ICD-10-CM | POA: Diagnosis not present

## 2019-03-15 DIAGNOSIS — Z8546 Personal history of malignant neoplasm of prostate: Secondary | ICD-10-CM | POA: Diagnosis not present

## 2019-03-15 DIAGNOSIS — E782 Mixed hyperlipidemia: Secondary | ICD-10-CM | POA: Diagnosis not present

## 2019-03-15 DIAGNOSIS — I25810 Atherosclerosis of coronary artery bypass graft(s) without angina pectoris: Secondary | ICD-10-CM | POA: Diagnosis not present

## 2019-03-15 DIAGNOSIS — I2510 Atherosclerotic heart disease of native coronary artery without angina pectoris: Secondary | ICD-10-CM | POA: Diagnosis not present

## 2019-03-15 DIAGNOSIS — E875 Hyperkalemia: Secondary | ICD-10-CM | POA: Diagnosis not present

## 2019-04-01 DIAGNOSIS — Z85828 Personal history of other malignant neoplasm of skin: Secondary | ICD-10-CM | POA: Diagnosis not present

## 2019-04-01 DIAGNOSIS — Z08 Encounter for follow-up examination after completed treatment for malignant neoplasm: Secondary | ICD-10-CM | POA: Diagnosis not present

## 2019-04-01 DIAGNOSIS — C44511 Basal cell carcinoma of skin of breast: Secondary | ICD-10-CM | POA: Diagnosis not present

## 2019-04-01 DIAGNOSIS — L820 Inflamed seborrheic keratosis: Secondary | ICD-10-CM | POA: Diagnosis not present

## 2019-04-01 DIAGNOSIS — D225 Melanocytic nevi of trunk: Secondary | ICD-10-CM | POA: Diagnosis not present

## 2019-04-01 DIAGNOSIS — L570 Actinic keratosis: Secondary | ICD-10-CM | POA: Diagnosis not present

## 2019-04-01 DIAGNOSIS — X32XXXD Exposure to sunlight, subsequent encounter: Secondary | ICD-10-CM | POA: Diagnosis not present

## 2019-04-01 DIAGNOSIS — C44519 Basal cell carcinoma of skin of other part of trunk: Secondary | ICD-10-CM | POA: Diagnosis not present

## 2019-04-01 DIAGNOSIS — B078 Other viral warts: Secondary | ICD-10-CM | POA: Diagnosis not present

## 2019-04-16 ENCOUNTER — Other Ambulatory Visit: Payer: Self-pay | Admitting: Cardiology

## 2019-05-11 DIAGNOSIS — Z23 Encounter for immunization: Secondary | ICD-10-CM | POA: Diagnosis not present

## 2019-05-13 ENCOUNTER — Other Ambulatory Visit: Payer: Self-pay

## 2019-05-13 ENCOUNTER — Ambulatory Visit (INDEPENDENT_AMBULATORY_CARE_PROVIDER_SITE_OTHER): Payer: Medicare Other | Admitting: Otolaryngology

## 2019-05-13 DIAGNOSIS — H6123 Impacted cerumen, bilateral: Secondary | ICD-10-CM | POA: Diagnosis not present

## 2019-05-13 DIAGNOSIS — H9313 Tinnitus, bilateral: Secondary | ICD-10-CM

## 2019-05-13 DIAGNOSIS — H903 Sensorineural hearing loss, bilateral: Secondary | ICD-10-CM

## 2019-05-16 DIAGNOSIS — C61 Malignant neoplasm of prostate: Secondary | ICD-10-CM | POA: Diagnosis not present

## 2019-05-22 ENCOUNTER — Ambulatory Visit (INDEPENDENT_AMBULATORY_CARE_PROVIDER_SITE_OTHER): Payer: Medicare Other | Admitting: Urology

## 2019-05-22 DIAGNOSIS — Z79899 Other long term (current) drug therapy: Secondary | ICD-10-CM | POA: Diagnosis not present

## 2019-05-22 DIAGNOSIS — C61 Malignant neoplasm of prostate: Secondary | ICD-10-CM

## 2019-05-22 DIAGNOSIS — C7951 Secondary malignant neoplasm of bone: Secondary | ICD-10-CM | POA: Diagnosis not present

## 2019-05-23 DIAGNOSIS — C44319 Basal cell carcinoma of skin of other parts of face: Secondary | ICD-10-CM | POA: Diagnosis not present

## 2019-05-23 DIAGNOSIS — X32XXXD Exposure to sunlight, subsequent encounter: Secondary | ICD-10-CM | POA: Diagnosis not present

## 2019-05-23 DIAGNOSIS — L570 Actinic keratosis: Secondary | ICD-10-CM | POA: Diagnosis not present

## 2019-05-23 DIAGNOSIS — Z85828 Personal history of other malignant neoplasm of skin: Secondary | ICD-10-CM | POA: Diagnosis not present

## 2019-05-23 DIAGNOSIS — L820 Inflamed seborrheic keratosis: Secondary | ICD-10-CM | POA: Diagnosis not present

## 2019-05-23 DIAGNOSIS — Z08 Encounter for follow-up examination after completed treatment for malignant neoplasm: Secondary | ICD-10-CM | POA: Diagnosis not present

## 2019-07-02 DIAGNOSIS — R7301 Impaired fasting glucose: Secondary | ICD-10-CM | POA: Diagnosis not present

## 2019-07-02 DIAGNOSIS — E875 Hyperkalemia: Secondary | ICD-10-CM | POA: Diagnosis not present

## 2019-07-02 DIAGNOSIS — R946 Abnormal results of thyroid function studies: Secondary | ICD-10-CM | POA: Diagnosis not present

## 2019-07-02 DIAGNOSIS — E782 Mixed hyperlipidemia: Secondary | ICD-10-CM | POA: Diagnosis not present

## 2019-07-02 DIAGNOSIS — D509 Iron deficiency anemia, unspecified: Secondary | ICD-10-CM | POA: Diagnosis not present

## 2019-07-09 DIAGNOSIS — I25810 Atherosclerosis of coronary artery bypass graft(s) without angina pectoris: Secondary | ICD-10-CM | POA: Diagnosis not present

## 2019-07-09 DIAGNOSIS — R946 Abnormal results of thyroid function studies: Secondary | ICD-10-CM | POA: Diagnosis not present

## 2019-07-09 DIAGNOSIS — Z0000 Encounter for general adult medical examination without abnormal findings: Secondary | ICD-10-CM | POA: Diagnosis not present

## 2019-07-09 DIAGNOSIS — R7301 Impaired fasting glucose: Secondary | ICD-10-CM | POA: Diagnosis not present

## 2019-07-09 DIAGNOSIS — E875 Hyperkalemia: Secondary | ICD-10-CM | POA: Diagnosis not present

## 2019-07-09 DIAGNOSIS — D509 Iron deficiency anemia, unspecified: Secondary | ICD-10-CM | POA: Diagnosis not present

## 2019-07-09 DIAGNOSIS — M48061 Spinal stenosis, lumbar region without neurogenic claudication: Secondary | ICD-10-CM | POA: Diagnosis not present

## 2019-07-09 DIAGNOSIS — E782 Mixed hyperlipidemia: Secondary | ICD-10-CM | POA: Diagnosis not present

## 2019-07-09 DIAGNOSIS — Z8546 Personal history of malignant neoplasm of prostate: Secondary | ICD-10-CM | POA: Diagnosis not present

## 2019-07-15 ENCOUNTER — Other Ambulatory Visit: Payer: Self-pay | Admitting: Cardiology

## 2019-08-01 ENCOUNTER — Other Ambulatory Visit: Payer: Self-pay | Admitting: Cardiology

## 2019-08-06 DIAGNOSIS — E7849 Other hyperlipidemia: Secondary | ICD-10-CM | POA: Diagnosis not present

## 2019-08-06 DIAGNOSIS — E875 Hyperkalemia: Secondary | ICD-10-CM | POA: Diagnosis not present

## 2019-08-06 DIAGNOSIS — I2510 Atherosclerotic heart disease of native coronary artery without angina pectoris: Secondary | ICD-10-CM | POA: Diagnosis not present

## 2019-08-06 DIAGNOSIS — I25810 Atherosclerosis of coronary artery bypass graft(s) without angina pectoris: Secondary | ICD-10-CM | POA: Diagnosis not present

## 2019-08-06 DIAGNOSIS — R7301 Impaired fasting glucose: Secondary | ICD-10-CM | POA: Diagnosis not present

## 2019-08-06 DIAGNOSIS — Z8546 Personal history of malignant neoplasm of prostate: Secondary | ICD-10-CM | POA: Diagnosis not present

## 2019-08-06 DIAGNOSIS — D509 Iron deficiency anemia, unspecified: Secondary | ICD-10-CM | POA: Diagnosis not present

## 2019-09-16 DIAGNOSIS — C61 Malignant neoplasm of prostate: Secondary | ICD-10-CM | POA: Diagnosis not present

## 2019-09-16 DIAGNOSIS — C7951 Secondary malignant neoplasm of bone: Secondary | ICD-10-CM | POA: Diagnosis not present

## 2019-09-16 DIAGNOSIS — Z79899 Other long term (current) drug therapy: Secondary | ICD-10-CM | POA: Diagnosis not present

## 2019-09-24 DIAGNOSIS — Z79899 Other long term (current) drug therapy: Secondary | ICD-10-CM | POA: Diagnosis not present

## 2019-09-24 DIAGNOSIS — C7951 Secondary malignant neoplasm of bone: Secondary | ICD-10-CM | POA: Diagnosis not present

## 2019-09-24 DIAGNOSIS — C61 Malignant neoplasm of prostate: Secondary | ICD-10-CM | POA: Diagnosis not present

## 2019-09-24 DIAGNOSIS — I10 Essential (primary) hypertension: Secondary | ICD-10-CM | POA: Diagnosis not present

## 2019-09-24 DIAGNOSIS — Z79818 Long term (current) use of other agents affecting estrogen receptors and estrogen levels: Secondary | ICD-10-CM | POA: Diagnosis not present

## 2019-09-26 DIAGNOSIS — I25810 Atherosclerosis of coronary artery bypass graft(s) without angina pectoris: Secondary | ICD-10-CM | POA: Diagnosis not present

## 2019-09-26 DIAGNOSIS — E782 Mixed hyperlipidemia: Secondary | ICD-10-CM | POA: Diagnosis not present

## 2019-09-26 DIAGNOSIS — I2510 Atherosclerotic heart disease of native coronary artery without angina pectoris: Secondary | ICD-10-CM | POA: Diagnosis not present

## 2019-09-26 DIAGNOSIS — Z8546 Personal history of malignant neoplasm of prostate: Secondary | ICD-10-CM | POA: Diagnosis not present

## 2019-09-26 DIAGNOSIS — E875 Hyperkalemia: Secondary | ICD-10-CM | POA: Diagnosis not present

## 2019-09-26 DIAGNOSIS — R7301 Impaired fasting glucose: Secondary | ICD-10-CM | POA: Diagnosis not present

## 2019-09-26 DIAGNOSIS — D509 Iron deficiency anemia, unspecified: Secondary | ICD-10-CM | POA: Diagnosis not present

## 2019-11-18 DIAGNOSIS — M545 Low back pain: Secondary | ICD-10-CM | POA: Diagnosis not present

## 2019-11-20 ENCOUNTER — Ambulatory Visit (INDEPENDENT_AMBULATORY_CARE_PROVIDER_SITE_OTHER): Payer: Medicare Other | Admitting: Urology

## 2019-11-20 ENCOUNTER — Encounter: Payer: Self-pay | Admitting: Urology

## 2019-11-20 ENCOUNTER — Other Ambulatory Visit: Payer: Self-pay

## 2019-11-20 VITALS — BP 157/72 | HR 82 | Temp 97.5°F | Ht 66.0 in | Wt 165.0 lb

## 2019-11-20 DIAGNOSIS — R351 Nocturia: Secondary | ICD-10-CM

## 2019-11-20 DIAGNOSIS — C61 Malignant neoplasm of prostate: Secondary | ICD-10-CM | POA: Diagnosis not present

## 2019-11-20 MED ORDER — LEUPROLIDE ACETATE (6 MONTH) 45 MG IM KIT
45.0000 mg | PACK | Freq: Once | INTRAMUSCULAR | Status: AC
  Administered 2019-11-20: 45 mg via INTRAMUSCULAR

## 2019-11-20 NOTE — Patient Instructions (Signed)
Prostate Cancer  The prostate is a male gland that helps make semen. Prostate cancer is when abnormal cells grow in this gland. Follow these instructions at home:  Take over-the-counter and prescription medicines only as told by your doctor.  Eat a healthy diet.  Get plenty of sleep.  Ask your doctor for help to find a support group for men with prostate cancer.  Keep all follow-up visits as told by your doctor. This is important.  If you have to go to the hospital, let your cancer doctor (oncologist) know.  Touch, hold, hug, and caress your partner to continue to show sexual feelings. Contact a doctor if:  You have trouble peeing (urinating).  You have blood in your pee (urine).  You have pain in your hips, back, or chest. Get help right away if:  You have weakness in your legs.  You lose feeling (have numbness) in your legs.  You cannot control your pee or your poop (stool).  You have trouble breathing.  You have sudden pain in your chest.  You have chills or a fever. Summary  The prostate is a male gland that helps make semen. Prostate cancer is when abnormal cells grow in this gland.  Ask your doctor for help to find a support group for men with prostate cancer.  Contact a doctor if you have problems peeing or have any new pain that you did not have before. This information is not intended to replace advice given to you by your health care provider. Make sure you discuss any questions you have with your health care provider. Document Revised: 06/30/2017 Document Reviewed: 03/28/2016 Elsevier Patient Education  2020 Elsevier Inc.  

## 2019-11-20 NOTE — Progress Notes (Signed)
11/20/2019 2:51 PM   Samuel Gregory 1940/08/21 [id number]  Referring provider: Celene Squibb, MD [address] [place],  [place] [address]  Prostate cancer and OAb  HPI: Mr Freet is a 79yo here for followup for prostate cancer and OAB. PSA from 09/16/2019 remains undetectable. He has bilateral hip pain and underwent injection which improved his pain. No other bone pain. He wears 1 pad at night. NO daytime incontinence. He is not on any OAB meds  His records from [facility] are as follows: 02/17/2016: He underwent RRP by Dr. Michela Pitcher in 2004 then underwent IMRT after PSA recurrance. His PSAs have been very low since radiation until last year when it was around 1. Now it is 10.88.  He has shoulder and back pain   03/23/2016: He underwent CT scan and Bone scan which shows spine and pelvic metastatic disease. Minimal hot flashes   04/27/2016: PSA was 0.8 after firmagon injection. Bone scan and CT scan showed multiple sclerotic lesions in the spine   06/29/2016: He is currently on ADT for metastatic bone disease. He spine pain has improved on ADT. PSA was 0.2 down from 0.8 in 03/2016.   11/01/2016: PSa was 0.1 on ADT. He has severe hot flashes that keep him up at night. He has worsening frequency and nocturia for the past 6 months   03/08/2017: His PSa is 0.07 on ADT and Xtiga   07/12/2017: PSA is 0.04 on ADT and xtiga   11/15/2017: No recent PSA. No new bone pain. He is on lupron and xtiga.   05/16/2018: PSA undetectable. No new bone pain. He is on lupron and abiraterone.   11/21/2018: PSA remains undetectable   05/22/2019: PSA undetectable     PMH: Past Medical History:  Diagnosis Date  . Arthritis   . Collagen vascular disease ([place])   . Coronary atherosclerosis of native coronary artery    Multivessel status post CABG 2000  . Essential hypertension, benign   . Hyperlipidemia   . Prostate cancer [facility]     Surgical History: Past Surgical History:  Procedure Laterality Date  .  APPENDECTOMY     8 yrs ago- Dr Geroge Baseman  . CATARACT EXTRACTION W/PHACO Left 07/14/2014   Procedure: CATARACT EXTRACTION PHACO AND INTRAOCULAR LENS PLACEMENT (IOC);  Surgeon: Tonny Branch, MD;  Location: [facility] ORS;  Service: Ophthalmology;  Laterality: Left;  CDE:6.20  . CATARACT EXTRACTION W/PHACO Right 08/11/2014   Procedure: CATARACT EXTRACTION PHACO AND INTRAOCULAR LENS PLACEMENT RIGHT EYE;  Surgeon: Tonny Branch, MD;  Location: [facility] ORS;  Service: Ophthalmology;  Laterality: Right;  CDE:10.11  . COLONOSCOPY N/A 01/14/2015   Procedure: COLONOSCOPY;  Surgeon: Rogene Houston, MD;  Location: [facility] ENDO SU[facility];  Service: Endoscopy;  Laterality: N/A;  730  . CORONARY ARTERY BYPASS GRAFT  01/1999   LIMA to LAD, SVG to diagonal, sequential SVG to PDA and PLB  . PROSTATECTOMY  2004   [name]  . TONSILLECTOMY     age 79    Home Medications:  Allergies as of 11/20/2019      Reactions   Latex Rash   Sulfa Antibiotics Rash      Medication List       Accurate as of November 20, 2019  2:51 PM. If you have any questions, ask your nurse or doctor.        aspirin EC 81 MG tablet Take 1 tablet (81 mg total) by mouth daily.   CALCIUM 1200+D3 PO Take 1 tablet by  mouth daily.   fluticasone 50 MCG/ACT nasal spray Commonly known as: FLONASE Place 1 spray into both nostrils daily as needed for allergies or rhinitis.   methocarbamol 750 MG tablet Commonly known as: ROBAXIN Take 750 mg by mouth 3 (three) times daily.   metoprolol succinate 25 MG 24 hr tablet Commonly known as: TOPROL-XL TAKE (1) TABLET BY MOUTH DAILY OR AS OTHERWISE DIRECTED.   multivitamin with minerals Tabs tablet Take 1 tablet by mouth daily.   naproxen sodium 220 MG tablet Commonly known as: ALEVE Take 220 mg by mouth daily as needed (pain).   nitroGLYCERIN 0.4 MG SL tablet Commonly known as: NITROSTAT Place 1 tablet (0.4 mg total) under the tongue every 5 (five) minutes x 3 doses as needed for chest pain (if no relief  after 3rd dose, proceed to the ED for an evaluation or call 911).   predniSONE 5 MG tablet Commonly known as: DELTASONE Take 1 tablet by mouth 2 (two) times daily.   predniSONE 20 MG tablet Commonly known as: DELTASONE Take 20 mg by mouth 2 (two) times daily.   ramipril 5 MG capsule Commonly known as: ALTACE TAKE (1) CAPSULE BY MOUTH ONCE DAILY.   rosuvastatin 20 MG tablet Commonly known as: CRESTOR TAKE 1 TABLET BY MOUTH DAILY.   VITAMIN B-1 PO Take 1 tablet by mouth daily.   Vitamins/Minerals Tabs Take by mouth.   Zytiga 250 MG tablet Generic drug: abiraterone acetate Take 4 tablets by mouth daily.       Allergies:  Allergies  Allergen Reactions  . Latex Rash  . Sulfa Antibiotics Rash    Family History: Family History  Problem Relation Age of Onset  . Heart attack Mother   . Cancer Mother   . Aneurysm Father     Social History:  reports that he has never smoked. He has never used smokeless tobacco. He reports current alcohol use. He reports that he does not use drugs.  ROS: All other review of systems were reviewed and are negative except what is noted above in HPI  Physical Exam: BP (!) 157/72   Pulse 82   Temp (!) 97.5 F (36.4 C)   Ht 5\' 6"  (1.676 m)   Wt 165 lb (74.8 kg)   BMI 26.63 kg/m   Constitutional:  Alert and oriented, No acute distress. HEENT: [place] AT, moist mucus membranes.  Trachea midline, no masses. Cardiovascular: No clubbing, cyanosis, or edema. Respiratory: Normal respiratory effort, no increased work of breathing. GI: Abdomen is soft, nontender, nondistended, no abdominal masses GU: No CVA tenderness.  Lymph: No cervical or inguinal lymphadenopathy. Skin: No rashes, bruises or suspicious lesions. Neurologic: Grossly intact, no focal deficits, moving all 4 extremities. Psychiatric: Normal mood and affect.  Laboratory Data: Lab Results  Component Value Date   WBC 5.2 10/06/2016   HGB 14.5 10/06/2016   HCT 43.1 10/06/2016    MCV 94.5 10/06/2016   PLT 168 10/06/2016    Lab Results  Component Value Date   CREATININE 1.34 (H) 10/06/2016    No results found for: PSA  No results found for: TESTOSTERONE  No results found for: HGBA1C  Urinalysis    Component Value Date/Time   COLORURINE YELLOW 10/29/2007 2036   APPEARANCEUR CLEAR 10/29/2007 2036   LABSPEC >1.030 (H) 10/29/2007 2036   PHURINE 5.0 10/29/2007 2036   GLUCOSEU NEGATIVE 10/29/2007 2036   HGBUR NEGATIVE 10/29/2007 2036   BILIRUBINUR NEGATIVE 10/29/2007 2036   KETONESUR NEGATIVE 10/29/2007 2036   PROTEINUR  NEGATIVE 10/29/2007 2036   UROBILINOGEN 0.2 10/29/2007 2036   NITRITE NEGATIVE 10/29/2007 2036   LEUKOCYTESUR  10/29/2007 2036    NEGATIVE MICROSCOPIC NOT DONE ON URINES WITH NEGATIVE PROTEIN, BLOOD, LEUKOCYTES, NITRITE, OR GLUCOSE <1000 mg/dL.    No results found for: LABMICR, [place], RBCUA, LABEPIT, MUCUS, BACTERIA  Pertinent Imaging:  No results found for this or any previous visit. No results found for this or any previous visit. No results found for this or any previous visit. No results found for this or any previous visit. No results found for this or any previous visit. No results found for this or any previous visit. No results found for this or any previous visit. No results found for this or any previous visit.  Assessment & Plan:    1. Prostate cancer ([place]) -Lupron 45mg  today -RTC 6 months with PSA  2. Nocturia -continue fluid management   No follow-ups on file.  Nicolette Bang, MD  [facility] [place]

## 2019-11-20 NOTE — Progress Notes (Signed)
Urological Symptom Review  Patient is experiencing the following symptoms: Get up at night to urinate Leakage of urine   Review of Systems  Gastrointestinal (upper)  : Negative for upper GI symptoms  Gastrointestinal (lower) : Negative for lower GI symptoms  Constitutional : Night Sweats Fatigue  Skin: Negative for skin symptoms  Eyes: Negative for eye symptoms  Ear/Nose/Throat : Negative for Ear/Nose/Throat symptoms  Hematologic/Lymphatic: Bruise/bleed easily  Cardiovascular : Negative for cardiovascular symptoms  Respiratory : Negative for respiratory symptoms  Endocrine: Negative for endocrine symptoms  Musculoskeletal: Back pain Joint pain  Neurological: Dizziness  Psychologic: Negative for psychiatric symptoms

## 2019-11-21 DIAGNOSIS — R7301 Impaired fasting glucose: Secondary | ICD-10-CM | POA: Diagnosis not present

## 2019-11-21 DIAGNOSIS — I25810 Atherosclerosis of coronary artery bypass graft(s) without angina pectoris: Secondary | ICD-10-CM | POA: Diagnosis not present

## 2019-11-21 DIAGNOSIS — D509 Iron deficiency anemia, unspecified: Secondary | ICD-10-CM | POA: Diagnosis not present

## 2019-11-21 DIAGNOSIS — E875 Hyperkalemia: Secondary | ICD-10-CM | POA: Diagnosis not present

## 2019-11-21 DIAGNOSIS — E782 Mixed hyperlipidemia: Secondary | ICD-10-CM | POA: Diagnosis not present

## 2019-11-21 DIAGNOSIS — I2510 Atherosclerotic heart disease of native coronary artery without angina pectoris: Secondary | ICD-10-CM | POA: Diagnosis not present

## 2019-11-21 DIAGNOSIS — Z8546 Personal history of malignant neoplasm of prostate: Secondary | ICD-10-CM | POA: Diagnosis not present

## 2019-11-25 DIAGNOSIS — M545 Low back pain: Secondary | ICD-10-CM | POA: Diagnosis not present

## 2019-11-27 ENCOUNTER — Ambulatory Visit (HOSPITAL_COMMUNITY)
Admission: RE | Admit: 2019-11-27 | Discharge: 2019-11-27 | Disposition: A | Discharge status: Home / Self Care | Payer: Medicare Other | Source: Ambulatory Visit | Attending: Nurse Practitioner | Admitting: Nurse Practitioner

## 2019-11-27 ENCOUNTER — Other Ambulatory Visit (HOSPITAL_COMMUNITY): Payer: Self-pay | Admitting: Nurse Practitioner

## 2019-11-27 ENCOUNTER — Other Ambulatory Visit: Payer: Self-pay

## 2019-11-27 DIAGNOSIS — M545 Low back pain: Secondary | ICD-10-CM | POA: Diagnosis not present

## 2019-11-27 DIAGNOSIS — M5440 Lumbago with sciatica, unspecified side: Secondary | ICD-10-CM | POA: Diagnosis not present

## 2019-11-29 ENCOUNTER — Encounter: Payer: Self-pay | Admitting: Cardiology

## 2019-11-29 ENCOUNTER — Telehealth (INDEPENDENT_AMBULATORY_CARE_PROVIDER_SITE_OTHER): Payer: Medicare Other | Admitting: Cardiology

## 2019-11-29 ENCOUNTER — Telehealth: Payer: Self-pay

## 2019-11-29 VITALS — BP 143/87 | HR 70 | Ht 66.0 in | Wt 165.0 lb

## 2019-11-29 DIAGNOSIS — E782 Mixed hyperlipidemia: Secondary | ICD-10-CM

## 2019-11-29 DIAGNOSIS — I25119 Atherosclerotic heart disease of native coronary artery with unspecified angina pectoris: Secondary | ICD-10-CM

## 2019-11-29 MED ORDER — ROSUVASTATIN CALCIUM 40 MG PO TABS: 40.0000 mg | ORAL_TABLET | Freq: Every day | ORAL | 3 refills | Status: AC

## 2019-11-29 NOTE — Telephone Encounter (Signed)
Pt will increase crestor to 40 mg daily at dinner.I will mail lab slips to him

## 2019-11-29 NOTE — Patient Instructions (Signed)
Medication Instructions: Your physician recommends that you continue on your current medications as directed. Please refer to the Current Medication list given to you today.   Labwork: None today  Procedures/Testing: None today  Follow-Up: 1 year office visit with Dr.McDowell  Any Additional Special Instructions Will Be Listed Below (If Applicable).     If you need a refill on your cardiac medications before your next appointment, please call your pharmacy.     Thank you for choosing [facility] !        

## 2019-11-29 NOTE — Progress Notes (Signed)
Virtual Visit via Telephone Note   This visit type was conducted due to national recommendations for restrictions regarding the COVID-19 Pandemic (e.g. social distancing) in an effort to limit this patient's exposure and mitigate transmission in our community.  Due to his co-morbid illnesses, this patient is at least at moderate risk for complications without adequate follow up.  This format is felt to be most appropriate for this patient at this time.  The patient did not have access to video technology/had technical difficulties with video requiring transitioning to audio format only (telephone).  All issues noted in this document were discussed and addressed.  No physical exam could be performed with this format.  Please refer to the patient's chart for his  consent to telehealth for [facility].   The patient was identified using 2 identifiers.  Date:  11/29/2019   ID:  Samuel Gregory, DOB 1940-10-05, MRN [id number]  Patient Location: Home Provider Location: Office  PCP:  Celene Squibb, MD  Cardiologist:  Rozann Lesches, MD Electrophysiologist:  None   Evaluation Performed:  Follow-Up Visit  Chief Complaint:  Cardiac follow-up  History of Present Illness:    Samuel Gregory is a 79 y.o. male last assessed via telehealth encounter in May 2020.  We spoke by phone today.  Overall, he continues to do well without active angina on current medical regimen, no recent nitroglycerin use.  He has had some trouble with recurrent back pain, but states that this seems to be getting better with conservative measurements and follow-up by his PCP.  Reviewed his current medications, cardiac regimen includes aspirin, Toprol-XL, Altace, and Crestor.  We plan to request his most recent lab work from Dr. Nevada Crane.  Blood pressure was mildly elevated today.  He states that this is typically not the case.  I have asked him to keep an eye on it, he does have further room for up titration of Altace if  necessary.   Past Medical History:  Diagnosis Date  . Arthritis   . Collagen vascular disease ([place])   . Coronary atherosclerosis of native coronary artery    Multivessel status post CABG 2000  . Essential hypertension   . Hyperlipidemia   . Prostate cancer [facility])    Past Surgical History:  Procedure Laterality Date  . APPENDECTOMY     8 yrs ago- Dr Geroge Baseman  . CATARACT EXTRACTION W/PHACO Left 07/14/2014   Procedure: CATARACT EXTRACTION PHACO AND INTRAOCULAR LENS PLACEMENT (IOC);  Surgeon: Tonny Branch, MD;  Location: [facility] ORS;  Service: Ophthalmology;  Laterality: Left;  CDE:6.20  . CATARACT EXTRACTION W/PHACO Right 08/11/2014   Procedure: CATARACT EXTRACTION PHACO AND INTRAOCULAR LENS PLACEMENT RIGHT EYE;  Surgeon: Tonny Branch, MD;  Location: [facility] ORS;  Service: Ophthalmology;  Laterality: Right;  CDE:10.11  . COLONOSCOPY N/A 01/14/2015   Procedure: COLONOSCOPY;  Surgeon: Rogene Houston, MD;  Location: [facility] ENDO SUITE;  Service: Endoscopy;  Laterality: N/A;  730  . CORONARY ARTERY BYPASS GRAFT  01/1999   LIMA to LAD, SVG to diagonal, sequential SVG to PDA and PLB  . PROST[place]CTOMY  2004   [name]  . TONSILLECTOMY     age 64     Current Meds  Medication Sig  . aspirin EC 81 MG tablet Take 1 tablet (81 mg total) by mouth daily.  . Calcium-Magnesium-Vitamin D (CALCIUM 1200+D3 PO) Take 1 tablet by mouth daily.   . fluticasone (FLONASE) 50 MCG/ACT nasal spray Place 1 spray into both nostrils daily as  needed for allergies or rhinitis.  . methocarbamol (ROBAXIN) 750 MG tablet Take 750 mg by mouth 3 (three) times daily.  . metoprolol succinate (TOPROL-XL) 25 MG 24 hr tablet TAKE (1) TABLET BY MOUTH DAILY OR AS OTHERWISE DIRECTED.  . Multiple Vitamin (MULTIVITAMIN WITH MINERALS) TABS Take 1 tablet by mouth daily.  . naproxen sodium (ANAPROX) 220 MG tablet Take 220 mg by mouth daily as needed (pain).  . predniSONE (DELTASONE) 20 MG tablet Take 20 mg by mouth 2 (two) times daily.  .  predniSONE (DELTASONE) 5 MG tablet Take 1 tablet by mouth 2 (two) times daily.  . ramipril (ALTACE) 5 MG capsule TAKE (1) CAPSULE BY MOUTH ONCE DAILY.  . rosuvastatin (CRESTOR) 20 MG tablet TAKE 1 TABLET BY MOUTH DAILY.  Marland Kitchen Thiamine HCl (VITAMIN B-1 PO) Take 1 tablet by mouth daily.  . Vitamins/Minerals TABS Take by mouth.  Marland Kitchen ZYTIGA 250 MG tablet Take 4 tablets by mouth daily.     Allergies:   Latex and Sulfa antibiotics   ROS:   Back pain.  Prior CV studies:   The following studies were reviewed today:  Lexiscan Myoview 11/07/2017:  No diagnostic ST segment changes to indicate ischemia.  Small, moderate intensity, inferior septal defect that is reversible at the apex and partially reversible in the mid to basal segment. This is consistent with scar with mild to moderate peri-infarct ischemia.  This is an intermediate risk study.  Nuclear stress EF: 62%.  Labs/Other Tests and Data Reviewed:    EKG:  An ECG dated 01/11/2016 was personally reviewed today and demonstrated:  Normal sinus rhythm.  Recent Labs:  June 2018: Hemoglobin 13.5, platelets 191, BUN 24, creatinine 1.11, potassium 4.4, AST 22, ALT 17, cholesterol 202, triglycerides 83, HDL 74, LDL 111 January 2020: Hemoglobin 13.4, platelets 198, potassium 4.8, BUN 26, creatinine 1.3, AST 21, ALT 17  Wt Readings from Last 3 Encounters:  11/29/19 165 lb (74.8 kg)  11/20/19 165 lb (74.8 kg)  12/06/18 165 lb (74.8 kg)     Objective:    Vital Signs:  BP (!) 143/87   Pulse 70   Ht 5\' 6"  (1.676 m)   Wt 165 lb (74.8 kg)   BMI 26.63 kg/m    Patient spoke in full sentences, not short of breath. No audible wheezing or coughing.  ASSESSMENT & PLAN:    1.  Multivessel CAD status post CABG in 2000.  He has been stable without progressive angina on medical therapy or nitroglycerin use.  Myoview from 2019 showed inferior septal scar with mild to moderate peri-infarct ischemia and normal LVEF.  We will continue observation unless  symptoms escalate.  Continue aspirin, Toprol-XL, Altace, and Crestor.  He has nitroglycerin available.  2.  Mixed hyperlipidemia on Crestor.  Requesting lab work from Dr. Nevada Crane.  3.  Essential hypertension, systolic in the 0000000 today.  I asked him to keep an eye on blood pressure.  He does have further room for up titration of Altace if necessary.  Keep follow-up with Dr. Nevada Crane.  Time:   Today, I have spent 8 minutes with the patient with telehealth technology discussing the above problems.     Medication Adjustments/Labs and Tests Ordered: Current medicines are reviewed at length with the patient today.  Concerns regarding medicines are outlined above.   Tests Ordered: No orders of the defined types were placed in this encounter.   Medication Changes: No orders of the defined types were placed in this encounter.  Follow Up:  In Person 1 year in the [place] office.  Signed, Rozann Lesches, MD  11/29/2019 8:42 AM    [place]

## 2019-12-04 DIAGNOSIS — M4327 Fusion of spine, lumbosacral region: Secondary | ICD-10-CM | POA: Diagnosis not present

## 2019-12-04 DIAGNOSIS — M9973 Connective tissue and disc stenosis of intervertebral foramina of lumbar region: Secondary | ICD-10-CM | POA: Diagnosis not present

## 2019-12-06 ENCOUNTER — Other Ambulatory Visit (HOSPITAL_COMMUNITY): Payer: Self-pay | Admitting: Internal Medicine

## 2019-12-06 ENCOUNTER — Other Ambulatory Visit: Payer: Self-pay | Admitting: Internal Medicine

## 2019-12-06 DIAGNOSIS — M545 Low back pain, unspecified: Secondary | ICD-10-CM

## 2019-12-10 ENCOUNTER — Ambulatory Visit (HOSPITAL_COMMUNITY)
Admission: RE | Admit: 2019-12-10 | Discharge: 2019-12-10 | Disposition: A | Discharge status: Home / Self Care | Payer: Medicare Other | Source: Ambulatory Visit | Attending: Internal Medicine | Admitting: Internal Medicine

## 2019-12-10 ENCOUNTER — Other Ambulatory Visit: Payer: Self-pay

## 2019-12-10 DIAGNOSIS — M545 Low back pain, unspecified: Secondary | ICD-10-CM

## 2019-12-10 DIAGNOSIS — M5126 Other intervertebral disc displacement, lumbar region: Secondary | ICD-10-CM | POA: Diagnosis not present

## 2019-12-10 DIAGNOSIS — M48061 Spinal stenosis, lumbar region without neurogenic claudication: Secondary | ICD-10-CM | POA: Diagnosis not present

## 2019-12-10 MED ORDER — GADOBUTROL 1 MMOL/ML IV SOLN
7.0000 mL | Freq: Once | INTRAVENOUS | Status: AC | PRN
  Administered 2019-12-10: 7 mL via INTRAVENOUS

## 2019-12-12 DIAGNOSIS — I25810 Atherosclerosis of coronary artery bypass graft(s) without angina pectoris: Secondary | ICD-10-CM | POA: Diagnosis not present

## 2019-12-12 DIAGNOSIS — E875 Hyperkalemia: Secondary | ICD-10-CM | POA: Diagnosis not present

## 2019-12-12 DIAGNOSIS — Z8546 Personal history of malignant neoplasm of prostate: Secondary | ICD-10-CM | POA: Diagnosis not present

## 2019-12-12 DIAGNOSIS — I2510 Atherosclerotic heart disease of native coronary artery without angina pectoris: Secondary | ICD-10-CM | POA: Diagnosis not present

## 2019-12-12 DIAGNOSIS — D509 Iron deficiency anemia, unspecified: Secondary | ICD-10-CM | POA: Diagnosis not present

## 2019-12-12 DIAGNOSIS — R7301 Impaired fasting glucose: Secondary | ICD-10-CM | POA: Diagnosis not present

## 2019-12-12 DIAGNOSIS — E782 Mixed hyperlipidemia: Secondary | ICD-10-CM | POA: Diagnosis not present

## 2019-12-13 ENCOUNTER — Other Ambulatory Visit: Payer: Self-pay | Admitting: Physician Assistant

## 2019-12-13 DIAGNOSIS — M4327 Fusion of spine, lumbosacral region: Secondary | ICD-10-CM | POA: Diagnosis not present

## 2019-12-13 DIAGNOSIS — M533 Sacrococcygeal disorders, not elsewhere classified: Secondary | ICD-10-CM | POA: Diagnosis not present

## 2019-12-13 DIAGNOSIS — M9973 Connective tissue and disc stenosis of intervertebral foramina of lumbar region: Secondary | ICD-10-CM | POA: Diagnosis not present

## 2019-12-25 ENCOUNTER — Other Ambulatory Visit: Payer: Self-pay

## 2019-12-25 ENCOUNTER — Ambulatory Visit
Admission: RE | Admit: 2019-12-25 | Discharge: 2019-12-25 | Disposition: A | Discharge status: Home / Self Care | Payer: Medicare Other | Source: Ambulatory Visit | Attending: Physician Assistant | Admitting: Physician Assistant

## 2019-12-25 DIAGNOSIS — M533 Sacrococcygeal disorders, not elsewhere classified: Secondary | ICD-10-CM

## 2019-12-25 DIAGNOSIS — M545 Low back pain: Secondary | ICD-10-CM | POA: Diagnosis not present

## 2019-12-25 MED ORDER — METHYLPREDNISOLONE ACETATE 40 MG/ML INJ SUSP (RADIOLOG
120.0000 mg | Freq: Once | INTRAMUSCULAR | Status: AC
  Administered 2019-12-25: 120 mg via INTRA_ARTICULAR

## 2019-12-25 MED ORDER — IOPAMIDOL (ISOVUE-M 200) INJECTION 41%
1.0000 mL | Freq: Once | INTRAMUSCULAR | Status: AC
  Administered 2019-12-25: 1 mL via INTRA_ARTICULAR

## 2019-12-25 NOTE — Discharge Instructions (Signed)

## 2020-01-07 ENCOUNTER — Other Ambulatory Visit: Payer: Self-pay | Admitting: Physician Assistant

## 2020-01-07 DIAGNOSIS — M9973 Connective tissue and disc stenosis of intervertebral foramina of lumbar region: Secondary | ICD-10-CM

## 2020-01-07 DIAGNOSIS — M4327 Fusion of spine, lumbosacral region: Secondary | ICD-10-CM

## 2020-01-13 ENCOUNTER — Other Ambulatory Visit: Payer: Self-pay

## 2020-01-13 ENCOUNTER — Other Ambulatory Visit: Payer: Self-pay | Admitting: Physician Assistant

## 2020-01-13 ENCOUNTER — Ambulatory Visit
Admission: RE | Admit: 2020-01-13 | Discharge: 2020-01-13 | Disposition: A | Discharge status: Home / Self Care | Payer: Medicare Other | Source: Ambulatory Visit | Attending: Physician Assistant | Admitting: Physician Assistant

## 2020-01-13 DIAGNOSIS — M9973 Connective tissue and disc stenosis of intervertebral foramina of lumbar region: Secondary | ICD-10-CM

## 2020-01-13 DIAGNOSIS — M4327 Fusion of spine, lumbosacral region: Secondary | ICD-10-CM

## 2020-01-13 MED ORDER — METHYLPREDNISOLONE ACETATE 40 MG/ML INJ SUSP (RADIOLOG
120.0000 mg | Freq: Once | INTRAMUSCULAR | Status: AC
  Administered 2020-01-13: 120 mg via EPIDURAL

## 2020-01-13 MED ORDER — IOPAMIDOL (ISOVUE-M 200) INJECTION 41%
1.0000 mL | Freq: Once | INTRAMUSCULAR | Status: AC
  Administered 2020-01-13: 1 mL via EPIDURAL

## 2020-01-13 NOTE — Discharge Instructions (Signed)

## 2020-01-17 ENCOUNTER — Telehealth: Payer: Self-pay | Admitting: Urology

## 2020-01-17 NOTE — Telephone Encounter (Signed)
Pt requests nurse return his call regarding an issue he needs to discuss.

## 2020-01-17 NOTE — Telephone Encounter (Signed)
Pt had spinal injection this past Monday. Reports burning with urination that began last evening. Pt reports taking 2 Azo and had relief. Symptoms started again this am. Offered pt urine drop off- unable to come to office due to back issues and mobility right now. Encouraged pt to increase water intake. Pt voiced understanding and will call Monday if dysuria continues.

## 2020-01-21 ENCOUNTER — Emergency Department (HOSPITAL_COMMUNITY): Payer: Medicare Other

## 2020-01-21 ENCOUNTER — Other Ambulatory Visit: Payer: Self-pay

## 2020-01-21 ENCOUNTER — Inpatient Hospital Stay (HOSPITAL_COMMUNITY)
Admission: AD | Admit: 2020-01-21 | Discharge: 2020-01-27 | DRG: 871 | Disposition: A | Discharge status: Home Health | Payer: Medicare Other | Attending: Family Medicine | Admitting: Family Medicine

## 2020-01-21 ENCOUNTER — Inpatient Hospital Stay (HOSPITAL_COMMUNITY): Payer: Medicare Other

## 2020-01-21 ENCOUNTER — Encounter (HOSPITAL_COMMUNITY): Payer: Self-pay | Admitting: Emergency Medicine

## 2020-01-21 DIAGNOSIS — E872 Acidosis: Secondary | ICD-10-CM | POA: Diagnosis present

## 2020-01-21 DIAGNOSIS — M5441 Lumbago with sciatica, right side: Secondary | ICD-10-CM

## 2020-01-21 DIAGNOSIS — A419 Sepsis, unspecified organism: Secondary | ICD-10-CM | POA: Diagnosis present

## 2020-01-21 DIAGNOSIS — A409 Streptococcal sepsis, unspecified: Secondary | ICD-10-CM | POA: Diagnosis present

## 2020-01-21 DIAGNOSIS — Z452 Encounter for adjustment and management of vascular access device: Secondary | ICD-10-CM

## 2020-01-21 DIAGNOSIS — R7881 Bacteremia: Secondary | ICD-10-CM | POA: Diagnosis not present

## 2020-01-21 DIAGNOSIS — N179 Acute kidney failure, unspecified: Secondary | ICD-10-CM | POA: Diagnosis not present

## 2020-01-21 DIAGNOSIS — N1832 Chronic kidney disease, stage 3b: Secondary | ICD-10-CM | POA: Diagnosis present

## 2020-01-21 DIAGNOSIS — Z7982 Long term (current) use of aspirin: Secondary | ICD-10-CM

## 2020-01-21 DIAGNOSIS — R109 Unspecified abdominal pain: Secondary | ICD-10-CM | POA: Diagnosis present

## 2020-01-21 DIAGNOSIS — Z79899 Other long term (current) drug therapy: Secondary | ICD-10-CM | POA: Diagnosis not present

## 2020-01-21 DIAGNOSIS — D6959 Other secondary thrombocytopenia: Secondary | ICD-10-CM | POA: Diagnosis present

## 2020-01-21 DIAGNOSIS — I959 Hypotension, unspecified: Secondary | ICD-10-CM | POA: Diagnosis not present

## 2020-01-21 DIAGNOSIS — Z515 Encounter for palliative care: Secondary | ICD-10-CM

## 2020-01-21 DIAGNOSIS — I10 Essential (primary) hypertension: Secondary | ICD-10-CM | POA: Diagnosis present

## 2020-01-21 DIAGNOSIS — Z9079 Acquired absence of other genital organ(s): Secondary | ICD-10-CM | POA: Diagnosis not present

## 2020-01-21 DIAGNOSIS — Z8546 Personal history of malignant neoplasm of prostate: Secondary | ICD-10-CM

## 2020-01-21 DIAGNOSIS — Z9049 Acquired absence of other specified parts of digestive tract: Secondary | ICD-10-CM

## 2020-01-21 DIAGNOSIS — R6521 Severe sepsis with septic shock: Secondary | ICD-10-CM | POA: Diagnosis present

## 2020-01-21 DIAGNOSIS — I4892 Unspecified atrial flutter: Secondary | ICD-10-CM | POA: Diagnosis not present

## 2020-01-21 DIAGNOSIS — R791 Abnormal coagulation profile: Secondary | ICD-10-CM | POA: Diagnosis present

## 2020-01-21 DIAGNOSIS — E871 Hypo-osmolality and hyponatremia: Secondary | ICD-10-CM | POA: Diagnosis present

## 2020-01-21 DIAGNOSIS — C7889 Secondary malignant neoplasm of other digestive organs: Secondary | ICD-10-CM | POA: Diagnosis present

## 2020-01-21 DIAGNOSIS — Z7401 Bed confinement status: Secondary | ICD-10-CM | POA: Diagnosis not present

## 2020-01-21 DIAGNOSIS — C61 Malignant neoplasm of prostate: Secondary | ICD-10-CM | POA: Diagnosis present

## 2020-01-21 DIAGNOSIS — Z20822 Contact with and (suspected) exposure to covid-19: Secondary | ICD-10-CM | POA: Diagnosis present

## 2020-01-21 DIAGNOSIS — C7951 Secondary malignant neoplasm of bone: Secondary | ICD-10-CM | POA: Diagnosis present

## 2020-01-21 DIAGNOSIS — I4891 Unspecified atrial fibrillation: Secondary | ICD-10-CM | POA: Diagnosis present

## 2020-01-21 DIAGNOSIS — B955 Unspecified streptococcus as the cause of diseases classified elsewhere: Secondary | ICD-10-CM | POA: Diagnosis not present

## 2020-01-21 DIAGNOSIS — I9589 Other hypotension: Secondary | ICD-10-CM

## 2020-01-21 DIAGNOSIS — R0682 Tachypnea, not elsewhere classified: Secondary | ICD-10-CM

## 2020-01-21 DIAGNOSIS — Z951 Presence of aortocoronary bypass graft: Secondary | ICD-10-CM | POA: Diagnosis not present

## 2020-01-21 DIAGNOSIS — I129 Hypertensive chronic kidney disease with stage 1 through stage 4 chronic kidney disease, or unspecified chronic kidney disease: Secondary | ICD-10-CM | POA: Diagnosis present

## 2020-01-21 DIAGNOSIS — Z7189 Other specified counseling: Secondary | ICD-10-CM | POA: Diagnosis not present

## 2020-01-21 DIAGNOSIS — M5442 Lumbago with sciatica, left side: Secondary | ICD-10-CM | POA: Diagnosis present

## 2020-01-21 DIAGNOSIS — K8020 Calculus of gallbladder without cholecystitis without obstruction: Secondary | ICD-10-CM | POA: Diagnosis present

## 2020-01-21 DIAGNOSIS — E785 Hyperlipidemia, unspecified: Secondary | ICD-10-CM | POA: Diagnosis present

## 2020-01-21 DIAGNOSIS — R52 Pain, unspecified: Secondary | ICD-10-CM

## 2020-01-21 DIAGNOSIS — D649 Anemia, unspecified: Secondary | ICD-10-CM | POA: Diagnosis present

## 2020-01-21 DIAGNOSIS — E861 Hypovolemia: Secondary | ICD-10-CM

## 2020-01-21 DIAGNOSIS — I2510 Atherosclerotic heart disease of native coronary artery without angina pectoris: Secondary | ICD-10-CM | POA: Diagnosis present

## 2020-01-21 DIAGNOSIS — K8309 Other cholangitis: Secondary | ICD-10-CM | POA: Diagnosis present

## 2020-01-21 DIAGNOSIS — R160 Hepatomegaly, not elsewhere classified: Secondary | ICD-10-CM

## 2020-01-21 LAB — COMPREHENSIVE METABOLIC PANEL
ALT: 74 U/L — ABNORMAL HIGH (ref 0–44)
AST: 75 U/L — ABNORMAL HIGH (ref 15–41)
Albumin: 2.7 g/dL — ABNORMAL LOW (ref 3.5–5.0)
Alkaline Phosphatase: 77 U/L (ref 38–126)
Anion gap: 12 (ref 5–15)
BUN: 36 mg/dL — ABNORMAL HIGH (ref 8–23)
CO2: 25 mmol/L (ref 22–32)
Calcium: 9.1 mg/dL (ref 8.9–10.3)
Chloride: 95 mmol/L — ABNORMAL LOW (ref 98–111)
Creatinine, Ser: 1.96 mg/dL — ABNORMAL HIGH (ref 0.61–1.24)
GFR calc Af Amer: 37 mL/min — ABNORMAL LOW (ref >60)
GFR calc non Af Amer: 32 mL/min — ABNORMAL LOW (ref >60)
Glucose, Bld: 117 mg/dL — ABNORMAL HIGH (ref 70–99)
Potassium: 3.9 mmol/L (ref 3.5–5.1)
Sodium: 132 mmol/L — ABNORMAL LOW (ref 135–145)
Total Bilirubin: 0.9 mg/dL (ref 0.3–1.2)
Total Protein: 6.2 g/dL — ABNORMAL LOW (ref 6.5–8.1)

## 2020-01-21 LAB — CBC
HCT: 38 % — ABNORMAL LOW (ref 39.0–52.0)
Hemoglobin: 12.4 g/dL — ABNORMAL LOW (ref 13.0–17.0)
MCH: 31.4 pg (ref 26.0–34.0)
MCHC: 32.6 g/dL (ref 30.0–36.0)
MCV: 96.2 fL (ref 80.0–100.0)
Platelets: 180 10*3/uL (ref 150–400)
RBC: 3.95 MIL/uL — ABNORMAL LOW (ref 4.22–5.81)
RDW: 13.9 % (ref 11.5–15.5)
WBC: 15.4 10*3/uL — ABNORMAL HIGH (ref 4.0–10.5)
nRBC: 0 % (ref 0.0–0.2)

## 2020-01-21 LAB — URINALYSIS, ROUTINE W REFLEX MICROSCOPIC
Bilirubin Urine: NEGATIVE
Glucose, UA: NEGATIVE mg/dL
Ketones, ur: NEGATIVE mg/dL
Leukocytes,Ua: NEGATIVE
Nitrite: NEGATIVE
Protein, ur: 100 mg/dL — AB
Specific Gravity, Urine: 1.021 (ref 1.005–1.030)
pH: 5 (ref 5.0–8.0)

## 2020-01-21 LAB — CREATININE, SERUM
Creatinine, Ser: 1.5 mg/dL — ABNORMAL HIGH (ref 0.61–1.24)
GFR calc Af Amer: 51 mL/min — ABNORMAL LOW (ref >60)
GFR calc non Af Amer: 44 mL/min — ABNORMAL LOW (ref >60)

## 2020-01-21 LAB — LACTIC ACID, PLASMA: Lactic Acid, Venous: 1.1 mmol/L (ref 0.5–1.9)

## 2020-01-21 LAB — SARS CORONAVIRUS 2 BY RT PCR (HOSPITAL ORDER, PERFORMED IN ~~LOC~~ HOSPITAL LAB): SARS Coronavirus 2: NEGATIVE

## 2020-01-21 LAB — LIPASE, BLOOD: Lipase: 24 U/L (ref 11–51)

## 2020-01-21 MED ORDER — VANCOMYCIN HCL IN DEXTROSE 1-5 GM/200ML-% IV SOLN
1000.0000 mg | INTRAVENOUS | Status: DC
  Administered 2020-01-22: 1000 mg via INTRAVENOUS
  Filled 2020-01-21: qty 200

## 2020-01-21 MED ORDER — SODIUM CHLORIDE 0.9 % IV SOLN
2.0000 g | INTRAVENOUS | Status: DC
  Administered 2020-01-21: 2 g via INTRAVENOUS
  Filled 2020-01-21: qty 2

## 2020-01-21 MED ORDER — ACETAMINOPHEN 650 MG RE SUPP: 650.0000 mg | Freq: Four times a day (QID) | RECTAL | Status: DC | PRN

## 2020-01-21 MED ORDER — SODIUM CHLORIDE 0.9 % IV BOLUS
1000.0000 mL | Freq: Once | INTRAVENOUS | Status: AC
  Administered 2020-01-21: 1000 mL via INTRAVENOUS

## 2020-01-21 MED ORDER — SODIUM CHLORIDE 0.9 % IV BOLUS
500.0000 mL | Freq: Once | INTRAVENOUS | Status: AC
  Administered 2020-01-21: 500 mL via INTRAVENOUS

## 2020-01-21 MED ORDER — SODIUM CHLORIDE 0.9% FLUSH
3.0000 mL | INTRAVENOUS | Status: DC | PRN
  Administered 2020-01-27: 3 mL via INTRAVENOUS

## 2020-01-21 MED ORDER — NITROGLYCERIN 0.4 MG SL SUBL: 0.4000 mg | SUBLINGUAL_TABLET | SUBLINGUAL | Status: DC | PRN

## 2020-01-21 MED ORDER — NOREPINEPHRINE 4 MG/250ML-% IV SOLN: 2.0000 ug/min | INTRAVENOUS | Status: DC

## 2020-01-21 MED ORDER — NOREPINEPHRINE 4 MG/250ML-% IV SOLN: 0.0000 ug/min | INTRAVENOUS | Status: DC

## 2020-01-21 MED ORDER — HEPARIN SODIUM (PORCINE) 5000 UNIT/ML IJ SOLN
5000.0000 [IU] | Freq: Three times a day (TID) | INTRAMUSCULAR | Status: DC
  Administered 2020-01-21 – 2020-01-24 (×10): 5000 [IU] via SUBCUTANEOUS
  Filled 2020-01-21 (×10): qty 1

## 2020-01-21 MED ORDER — ASPIRIN EC 81 MG PO TBEC
81.0000 mg | DELAYED_RELEASE_TABLET | Freq: Every day | ORAL | Status: DC
  Administered 2020-01-22 – 2020-01-26 (×5): 81 mg via ORAL
  Filled 2020-01-21 (×7): qty 1

## 2020-01-21 MED ORDER — SODIUM CHLORIDE 0.9% FLUSH
3.0000 mL | Freq: Two times a day (BID) | INTRAVENOUS | Status: DC
  Administered 2020-01-22 – 2020-01-27 (×11): 3 mL via INTRAVENOUS

## 2020-01-21 MED ORDER — GADOBUTROL 1 MMOL/ML IV SOLN
10.0000 mL | Freq: Once | INTRAVENOUS | Status: AC | PRN
  Administered 2020-01-21: 10 mL via INTRAVENOUS

## 2020-01-21 MED ORDER — SODIUM CHLORIDE 0.9 % IV SOLN: INTRAVENOUS | Status: DC

## 2020-01-21 MED ORDER — METHOCARBAMOL 500 MG PO TABS
750.0000 mg | ORAL_TABLET | Freq: Three times a day (TID) | ORAL | Status: DC
  Administered 2020-01-21 – 2020-01-25 (×12): 750 mg via ORAL
  Filled 2020-01-21 (×14): qty 2

## 2020-01-21 MED ORDER — ALBUTEROL SULFATE (2.5 MG/3ML) 0.083% IN NEBU
2.5000 mg | INHALATION_SOLUTION | RESPIRATORY_TRACT | Status: DC | PRN
  Administered 2020-01-23: 2.5 mg via RESPIRATORY_TRACT
  Filled 2020-01-21: qty 3

## 2020-01-21 MED ORDER — ACETAMINOPHEN 325 MG PO TABS
650.0000 mg | ORAL_TABLET | Freq: Once | ORAL | Status: AC
  Administered 2020-01-21: 650 mg via ORAL
  Filled 2020-01-21: qty 2

## 2020-01-21 MED ORDER — FLUTICASONE PROPIONATE 50 MCG/ACT NA SUSP
1.0000 | Freq: Every day | NASAL | Status: DC | PRN
  Filled 2020-01-21: qty 16

## 2020-01-21 MED ORDER — ADULT MULTIVITAMIN W/MINERALS CH
1.0000 | ORAL_TABLET | Freq: Every day | ORAL | Status: DC
  Administered 2020-01-21 – 2020-01-27 (×7): 1 via ORAL
  Filled 2020-01-21 (×7): qty 1

## 2020-01-21 MED ORDER — SODIUM CHLORIDE 0.9 % IV SOLN: 250.0000 mL | INTRAVENOUS | Status: DC | PRN

## 2020-01-21 MED ORDER — ONDANSETRON HCL 4 MG PO TABS: 4.0000 mg | ORAL_TABLET | Freq: Four times a day (QID) | ORAL | Status: DC | PRN

## 2020-01-21 MED ORDER — TRAZODONE HCL 50 MG PO TABS
50.0000 mg | ORAL_TABLET | Freq: Every evening | ORAL | Status: DC | PRN
  Administered 2020-01-24: 50 mg via ORAL
  Filled 2020-01-21: qty 1

## 2020-01-21 MED ORDER — SODIUM CHLORIDE 0.9 % IV SOLN: 250.0000 mL | INTRAVENOUS | Status: DC

## 2020-01-21 MED ORDER — ONDANSETRON HCL 4 MG/2ML IJ SOLN: 4.0000 mg | Freq: Four times a day (QID) | INTRAMUSCULAR | Status: DC | PRN

## 2020-01-21 MED ORDER — SODIUM CHLORIDE 0.9 % IV SOLN
2.0000 g | INTRAVENOUS | Status: DC
  Administered 2020-01-21: 2 g via INTRAVENOUS
  Filled 2020-01-21: qty 20

## 2020-01-21 MED ORDER — POLYETHYLENE GLYCOL 3350 17 G PO PACK: 17.0000 g | PACK | Freq: Every day | ORAL | Status: DC | PRN

## 2020-01-21 MED ORDER — ABIRATERONE ACETATE 250 MG PO TABS
1000.0000 mg | ORAL_TABLET | Freq: Every day | ORAL | Status: DC
  Administered 2020-01-22 – 2020-01-27 (×6): 1000 mg via ORAL

## 2020-01-21 MED ORDER — ACETAMINOPHEN 325 MG PO TABS
650.0000 mg | ORAL_TABLET | Freq: Four times a day (QID) | ORAL | Status: DC | PRN
  Administered 2020-01-22 – 2020-01-23 (×2): 650 mg via ORAL
  Filled 2020-01-21 (×2): qty 2

## 2020-01-21 MED ORDER — SODIUM CHLORIDE 0.9 % IV SOLN
2.0000 g | Freq: Two times a day (BID) | INTRAVENOUS | Status: DC
  Administered 2020-01-22 – 2020-01-23 (×3): 2 g via INTRAVENOUS
  Filled 2020-01-21 (×3): qty 2

## 2020-01-21 MED ORDER — ROSUVASTATIN CALCIUM 20 MG PO TABS
40.0000 mg | ORAL_TABLET | Freq: Every day | ORAL | Status: DC
  Administered 2020-01-22 – 2020-01-26 (×5): 40 mg via ORAL
  Filled 2020-01-21 (×4): qty 2

## 2020-01-21 MED ORDER — VANCOMYCIN HCL 1500 MG/300ML IV SOLN
1500.0000 mg | Freq: Once | INTRAVENOUS | Status: AC
  Administered 2020-01-21: 1500 mg via INTRAVENOUS
  Filled 2020-01-21: qty 300

## 2020-01-21 MED ORDER — METRONIDAZOLE IN NACL 5-0.79 MG/ML-% IV SOLN
500.0000 mg | Freq: Three times a day (TID) | INTRAVENOUS | Status: DC
  Administered 2020-01-21 – 2020-01-23 (×5): 500 mg via INTRAVENOUS
  Filled 2020-01-21 (×5): qty 100

## 2020-01-21 NOTE — Progress Notes (Signed)
Pharmacy Antibiotic Note  Samuel Gregory is a 79 y.o. male admitted on 01/21/2020 with severe sepsis with septic shock, suspecting intraabdominal source.  Pt also with AKI vs AKI on CKD (admission Scr 1.96, no recent Scr's). Pt has remained persistently hypotensive despite IV fluid boluses. Pharmacy has been consulted for vancomycin and cefepime dosing.  Other medical problems include: pancreatic and liver masses, prostate cancer with bony mets (S/P prostatectomy in 2004), HTN, CAD  WBC 15.4, admission Scr 1.96, CrCl 28 ml/min; most recent Scr (20:57 PM) was 1.50 (CrCl 36.6)   Plan: Vancomycin 1500 mg IV X 1, followed by 1 gram IV Q 24 hrs (goal vancomycin trough: 15-20 mg/L) Cefepime 2 gm IV Q 12 hrs Monitor WBC, temp, clinical improvement, cultures, renal function, vancomycin levels as indicated  Height: 5\' 6"  (167.6 cm) Weight: 74.8 kg (165 lb) IBW/kg (Calculated) : 63.8  Temp (24hrs), Avg:99.8 F (37.7 C), Min:97.9 F (36.6 C), Max:100.7 F (38.2 C)  Recent Labs  Lab 01/21/20 0838  WBC 15.4*  CREATININE 1.96*    Estimated Creatinine Clearance: 28 mL/min (A) (by C-G formula based on SCr of 1.96 mg/dL (H)).    Allergies  Allergen Reactions  . Latex Rash  . Sulfa Antibiotics Rash    Antimicrobials this admission: 6/22 ceftriaxone X 1 6/22 cefepime >> 6/22 vancomycin >>  6/22 metronidazole  Microbiology results: 6/22 BCx X 1: pending 6/22 UCx: pending 6/22 COVID: negative  Thank you for allowing pharmacy to be a part of this patient's care.  Gillermina Hu, PharmD, BCPS, [facility] 01/21/2020 8:56 PM

## 2020-01-21 NOTE — ED Triage Notes (Signed)
Pt c/o of bilateral lower back pain that radiates down his buttocks and legs x 2 months. Woke up with worsening pain

## 2020-01-21 NOTE — ED Provider Notes (Signed)
[facility] Emergency Department Provider Note MRN:  [id number]  Arrival date & time: 01/21/20     Chief Complaint   Back Pain   History of Present Illness   Samuel Gregory is a 79 y.o. year-old male with a history of collagen vascular disease, CAD, hypertension, prostate cancer presenting to the ED with chief complaint of back pain.  Patient had back surgery 2 years ago and has had some chronic back issues since.  For the past 2 months, explains of the back pain has worsened.  Pain is located in the lower lumbar back and for the past 2 months has been radiating down the back of his legs bilaterally.  [place] and [place].  He denies fever, no headache or vision change, no chest pain or shortness of breath, no abdominal pain.  Pain is moderate, constant, worse with motion or palpation.  Denies bowel or bladder dysfunction, denies numbness or weakness to the arms or legs.  Review of Systems  A complete 10 system review of systems was obtained and all systems are negative except as noted in the HPI and PMH.   Patient's Health History    Past Medical History:  Diagnosis Date  . Arthritis   . Collagen vascular disease ([place])   . Coronary atherosclerosis of native coronary artery    Multivessel status post CABG 2000  . Essential hypertension   . Hyperlipidemia   . Prostate cancer [facility])     Past Surgical History:  Procedure Laterality Date  . APPENDECTOMY     8 yrs ago- Dr Geroge Baseman  . CATARACT EXTRACTION W/PHACO Left 07/14/2014   Procedure: CATARACT EXTRACTION PHACO AND INTRAOCULAR LENS PLACEMENT (IOC);  Surgeon: Tonny Branch, MD;  Location: [facility] ORS;  Service: Ophthalmology;  Laterality: Left;  CDE:6.20  . CATARACT EXTRACTION W/PHACO Right 08/11/2014   Procedure: CATARACT EXTRACTION PHACO AND INTRAOCULAR LENS PLACEMENT RIGHT EYE;  Surgeon: Tonny Branch, MD;  Location: [facility] ORS;  Service: Ophthalmology;  Laterality: Right;  CDE:10.11    . COLONOSCOPY N/A 01/14/2015   Procedure: COLONOSCOPY;  Surgeon: Rogene Houston, MD;  Location: [facility] ENDO SUITE;  Service: Endoscopy;  Laterality: N/A;  730  . CORONARY ARTERY BYPASS GRAFT  01/1999   LIMA to LAD, SVG to diagonal, sequential SVG to PDA and PLB  . PROST[place]CTOMY  2004   [name]  . TONSILLECTOMY     age 31    Family History  Problem Relation Age of Onset  . Heart attack Mother   . Cancer Mother   . Aneurysm Father     Social History   Socioeconomic History  . Marital status: Married    Spouse name: Not on file  . Number of children: Not on file  . Years of education: Not on file  . Highest education level: Not on file  Occupational History  . Not on file  Tobacco Use  . Smoking status: Never Smoker  . Smokeless tobacco: Never Used  Vaping Use  . Vaping Use: Never used  Substance and Sexual Activity  . Alcohol use: Yes    Alcohol/week: 0.0 standard drinks    Comment: daily-glass of wine  . Drug use: No  . Sexual activity: Not on file  Other Topics Concern  . Not on file  Social History Narrative  . Not on file   Social Determinants of Health   Financial Resource Strain:   . Difficulty of Paying Living Expenses:  Food Insecurity:   . Worried About Charity fundraiser in the Last Year:   . Arboriculturist in the Last Year:   Transportation Needs:   . Film/video editor (Medical):   Marland Kitchen Lack of Transportation (Non-Medical):   Physical Activity:   . Days of Exercise per Week:   . Minutes of Exercise per Session:   Stress:   . Feeling of Stress :   Social Connections:   . Frequency of Communication with Friends and Family:   . Frequency of Social Gatherings with Friends and Family:   . Attends Religious Services:   . Active Member of Clubs or Organizations:   . Attends Archivist Meetings:   Marland Kitchen Marital Status:   Intimate Partner Violence:   . Fear of Current or Ex-Partner:   . Emotionally Abused:   Marland Kitchen Physically Abused:   .  Sexually Abused:      Physical Exam   Vitals:   01/21/20 0917 01/21/20 0918  BP:    Pulse: 80 81  Resp: 17 20  Temp:    SpO2: 96% 95%    CONSTITUTIONAL: Well-appearing, NAD NEURO:  Alert and oriented x 3, decreased sensation to bilateral feet, normal sensation strength to the legs EYES:  eyes equal and reactive ENT/NECK:  no LAD, no JVD CARDIO: Regular rate, well-perfused, normal S1 and S2 PULM:  CTAB no wheezing or rhonchi GI/GU:  normal bowel sounds, non-distended, non-tender MSK/SPINE:  No gross deformities, no edema SKIN:  no rash, atraumatic PSYCH:  Appropriate speech and behavior  *Additional and/or pertinent findings included in MDM below  Diagnostic and Interventional Summary    EKG Interpretation  Date/Time:  Tuesday January 21 2020 08:32:59 EDT Ventricular Rate:  77 PR Interval:    QRS Duration: 99 QT Interval:  358 QTC Calculation: 406 R Axis:   -8 Text Interpretation: Sinus rhythm Low voltage, precordial leads Consider inferior infarct Confirmed by Gerlene Fee [phone]) on 01/21/2020 9:10:42 AM      Labs Reviewed  CBC - Abnormal; Notable for the following components:      Result Value   WBC 15.4 (*)    RBC 3.95 (*)    Hemoglobin 12.4 (*)    HCT 38.0 (*)    All other components within normal limits  COMPREHENSIVE METABOLIC PANEL - Abnormal; Notable for the following components:   Sodium 132 (*)    Chloride 95 (*)    Glucose, Bld 117 (*)    BUN 36 (*)    Creatinine, Ser 1.96 (*)    Total Protein 6.2 (*)    Albumin 2.7 (*)    AST 75 (*)    ALT 74 (*)    GFR calc non Af Amer 32 (*)    GFR calc Af Amer 37 (*)    All other components within normal limits  URINALYSIS, ROUTINE W REFLEX MICROSCOPIC - Abnormal; Notable for the following components:   Color, Urine AMBER (*)    APPearance CLOUDY (*)    Hgb urine dipstick SMALL (*)    Protein, ur 100 (*)    Bacteria, UA RARE (*)    Non Squamous Epithelial 0-5 (*)    All other components within normal  limits  CULTURE, BLOOD (SINGLE)  SARS CORONAVIRUS 2 BY RT PCR (HOSPITAL ORDER, [place] [facility])  LIPASE, BLOOD    CT RENAL STONE STUDY  Final Result      Medications  sodium chloride 0.9 % bolus 1,000  mL (has no administration in time range)  sodium chloride 0.9 % bolus 1,000 mL (0 mLs Intravenous Stopped 01/21/20 1100)     Procedures  /  Critical Care Procedures  ED Course and Medical Decision Making  I have reviewed the triage vital signs, the nursing notes, and pertinent available records from the EMR.  Listed above are laboratory and imaging tests that I personally ordered, reviewed, and interpreted and then considered in my medical decision making (see below for details).      Chronic foot decreased sensation for years, no new neurological deficits, no bowel or bladder dysfunction, doubt myelopathy.  Patient arrives with soft blood pressure in triage and on my evaluation is in the 85I systolic with heart rate 99.  No fever.  Will expand work-up to include CT to evaluate the caliber of the aorta, evaluate for any signs of intra-abdominal infection or bleeding.  Providing IV fluids.  Patient states that he does not drink very much water during the day, hypotension could just be related to dehydration.  Patient also endorsing some dysuria, UTI also possible.  Patient's blood pressure improving with fluids.  Labs reveal AKI.  CT revealing possible mass on the pancreas as well as mass on the liver.  Will admit to hospitalist for further care.  Barth Kirks. Sedonia Small, MD [place] [email or web address]  Final Clinical Impressions(s) / ED Diagnoses     ICD-10-CM   1. Hypotension, unspecified hypotension type  I95.9   2. Acute midline low back pain with bilateral sciatica  M54.42    M54.41   3. Liver mass  R16.0   4. AKI (acute kidney injury) ([place])  N17.9     ED Discharge Orders    None       Discharge  Instructions Discussed with and Provided to Patient:   Discharge Instructions   None       Maudie Flakes, MD 01/21/20 1143

## 2020-01-21 NOTE — H&P (Addendum)
Patient Demographics:    Samuel Gregory, is a 79 y.o. male  MRN: [id number]   DOB - [birth year]  Admit Date - 01/21/2020  Outpatient Primary MD for the patient is Celene Squibb, MD   Assessment & Plan:    Principal Problem:   Severe sepsis with septic shock [facility]) Active Problems:   Hypotension   Abdominal pain-Pancreatic and liver masses   Prostate cancer --with bony metastases, status post prior prostatectomy in 2004   Essential hypertension   Coronary atherosclerosis of native coronary artery   1)Presumed severe sepsis with septic shock--- suspect intra-abdominal source--???? Ascending Cholangitis-- --- patient meets criteria for severe sepsis with septic shock due to intra-abdominal infection-- has tachycardia, tachypnea as well as fever, chills, wbc 15k, presumed AKI and persistent hypotension despite IVF -----Patient remained persistently hypotensive-despite IV fluid boluses -Blood cultures urine cultures obtained IV Vanco, flagyl and cefepime started patient had received IV Rocephin earlier, -EDP to place central line for Levophed due to persistent hypotension -Lactic acid requested  2)Possible  peripheral cholangiocarcinoma--- CT renal protocol and MRI abdomen reviewed suggest possible peripheral cholangiocarcinoma--- notify Dr. Laural Golden, official GI consult pending in a.m. -CEA and CA 19-9 pending -Elevated LFTs noted, no evidence of cholestasis, T bili and alk phos are WNL  3)AKI Vs acute kidney injury on CKD stage -I favor the former over the later -No recent creatinines available -- creatinine on admission= 1.96, --- renally adjust medications, avoid nephrotoxic agents / dehydration  / hypotension -Hold ramipril due to presumed worsening renal function and persistent hypotension  4)H/o HTN--patient with  persistent hypotension due to suspected sepsis with septic shock, hold metoprolol and ramipril  5)Social/Ethics--patient is a full code, consider palliative care consult after GI input in am  -his spouse Ms Tyrik Stetzer  6)Metastatic prostate cancer with  bony metastases--- this probably explains patient's pelvic back pain -Unable to treat with narcotics due to persistent hypotension    Disposition/Need for in-Hospital Stay- patient unable to be discharged at this time due to --presumed severe sepsis with septic shock requiring IV fluids and IV antibiotics  Status is: Inpatient  Remains inpatient appropriate because:Hemodynamically unstable and --presumed sepsis with septic shock requiring IV fluids and IV antibiotics*   Dispo: The patient is from: Home              Anticipated d/c is to: Home              Anticipated d/c date is: > 3 days              Patient currently is not medically stable to d/c. Barriers: Not Clinically Stable- ---presumed severe sepsis with septic shock requiring IV fluids and IV antibiotics*  With History of - Reviewed by me  Past Medical History:  Diagnosis Date  . Arthritis   . Collagen vascular disease ([place])   . Coronary atherosclerosis of native coronary artery    Multivessel status post CABG 2000  .  Essential hypertension   . Hyperlipidemia   . Prostate cancer [facility])       Past Surgical History:  Procedure Laterality Date  . APPENDECTOMY     8 yrs ago- Dr Geroge Baseman  . CATARACT EXTRACTION W/PHACO Left 07/14/2014   Procedure: CATARACT EXTRACTION PHACO AND INTRAOCULAR LENS PLACEMENT (IOC);  Surgeon: Tonny Branch, MD;  Location: [facility];  Service: Ophthalmology;  Laterality: Left;  CDE:6.20  . CATARACT EXTRACTION W/PHACO Right 08/11/2014   Procedure: CATARACT EXTRACTION PHACO AND INTRAOCULAR LENS PLACEMENT RIGHT EYE;  Surgeon: Tonny Branch, MD;  Location: [facility];  Service: Ophthalmology;  Laterality: Right;  CDE:10.11  . COLONOSCOPY N/A  01/14/2015   Procedure: COLONOSCOPY;  Surgeon: Rogene Houston, MD;  Location: [facility] SUITE;  Service: Endoscopy;  Laterality: N/A;  730  . CORONARY ARTERY BYPASS GRAFT  01/1999   LIMA to LAD, SVG to diagonal, sequential SVG to PDA and PLB  . PROSTATECTOMY  2004   [name]  . TONSILLECTOMY     age 57    Chief Complaint  Patient presents with  . Back Pain      HPI:    Samuel Gregory  is a 79 y.o. male history of collagen vascular disease, CAD, hypertension, prostate cancer  and chronic back pain who presents with worsening back pain for the last couple of weeks  -No vomiting or diarrhea -Denies fevers or chills\- -had some nausea and slight abdominal discomfort -No dysuria or hematuria -Denies any recent fall or trauma In ED--is found to have a white count of 15.4 and hemoglobin of 12.4 (no recent baseline available) -History remarkable for sodium of 132, chloride of 95, glucose of 117 creatinine was up to 1.96, no recent creatinines available, -LFTs were elevated with AST of 75 and ALT of 74, -Lipase was only 24 -UA was suggestive of possible UTI urine culture pending -CT Renal Stone Protocol-- IMPRESSION: 1. Subtle area of relative low attenuation within the posterior aspect of the pancreatic head which appears new from prior. Pancreatic mass is not excluded. When the patient is clinically stable and able to follow directions and hold their breath (preferably as an outpatient) further evaluation with dedicated ontrast-enhanced pancreatic protocol MRI of the abdomen should be considered. 2. New slightly ill-defined 3.0 cm lesion within the inferior aspect of the right hepatic lobe with at least 1 additional subcentimeter indeterminate lesions within the liver. Findings are nonspecific but could reflect hepatic metastatic disease. These findings can also be further evaluated on the previously recommended MRI. 3. 5 mm noncalcified right lower lobe pulmonary nodule, new  from prior. 4. The sclerotic osseous lesions seen on prior CT 03/08/2016 have resolved. 5. Cholelithiasis without evidence of acute cholecystitis. 6. Colonic diverticulosis without evidence of acute diverticulitis. 7. Aortic atherosclerosis. (ICD10-I70.0).  Abd MRI-- There is a subcapsular mass in the inferior tip of the right lobe of the liver, hepatic segment VI, measuring 3.3 x 2.5 cm, with very gradual progressive peripheral nodular enhancement. 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  --I called -his spouse Ms Katharine Look [place]---[phone]--as per patient's wife patient has had fevers and chills at least for over 24 hours  --Patient remained persistently hypotensive-despite IV fluid boluses -Blood cultures urine cultures obtained, IV Vanco, flagyl and cefepime started patient had received IV Rocephin earlier, -EDP to place central line for Levophed due to persistent hypotension  -- patient meets criteria for severe sepsis with  septic shock due to has tachycardia, tachypnea as well as fever, chills, wbc 15k, presumed AKI and persistent hypotension      Review of systems:    In addition to the HPI above,   A full Review of  Systems was done, all other systems reviewed are negative except as noted above in HPI , .    Social History:  Reviewed by me    Social History   Tobacco Use  . Smoking status: Never Smoker  . Smokeless tobacco: Never Used  Substance Use Topics  . Alcohol use: Yes    Alcohol/week: 0.0 standard drinks    Comment: daily-glass of wine       Family History :  Reviewed by me    Family History  Problem Relation Age of Onset  . Heart attack Mother   . Cancer Mother   . Aneurysm Father      Home Medications:   Prior to Admission medications   Medication Sig Start Date End Date Taking? Authorizing Provider  aspirin EC 81 MG tablet Take 1  tablet (81 mg total) by mouth daily. 12/06/18  Yes Satira Sark, MD  Calcium-Magnesium-Vitamin D (CALCIUM 1200+D3 PO) Take 1 tablet by mouth daily.    Yes [provider]  fluticasone (FLONASE) 50 MCG/ACT nasal spray Place 1 spray into both nostrils daily as needed for allergies or rhinitis.   Yes [provider]  methocarbamol (ROBAXIN) 750 MG tablet Take 750 mg by mouth 3 (three) times daily. 11/18/19  Yes [provider]  metoprolol succinate (TOPROL-XL) 25 MG 24 hr tablet TAKE (1) TABLET BY MOUTH DAILY OR AS OTHERWISE DIRECTED. 08/01/19  Yes Satira Sark, MD  Multiple Vitamin (MULTIVITAMIN WITH MINERALS) TABS Take 1 tablet by mouth daily.   Yes [provider]  naproxen sodium (ANAPROX) 220 MG tablet Take 220 mg by mouth daily as needed (pain).   Yes [provider]  predniSONE (DELTASONE) 5 MG tablet Take 1 tablet by mouth 2 (two) times daily. 09/20/16  Yes [provider]  ramipril (ALTACE) 5 MG capsule TAKE (1) CAPSULE BY MOUTH ONCE DAILY. 02/27/19  Yes Satira Sark, MD  rosuvastatin (CRESTOR) 40 MG tablet Take 1 tablet (40 mg total) by mouth daily after supper. 11/29/19 02/27/20 Yes Satira Sark, MD  Thiamine HCl (VITAMIN B-1 PO) Take 1 tablet by mouth daily.   Yes [provider]  Vitamins/Minerals TABS Take by mouth.   Yes [provider]  ZYTIGA 250 MG tablet Take 4 tablets by mouth daily. 09/20/16  Yes [provider]  nitroGLYCERIN (NITROSTAT) 0.4 MG SL tablet Place 1 tablet (0.4 mg total) under the tongue every 5 (five) minutes x 3 doses as needed for chest pain (if no relief after 3rd dose, proceed to the ED for an evaluation or call 911). 12/06/18 03/06/19  Satira Sark, MD  predniSONE (DELTASONE) 20 MG tablet Take 20 mg by mouth 2 (two) times daily. Patient not taking: Reported on 01/21/2020 11/18/19   [provider]     Allergies:     Allergies  Allergen Reactions  .  Latex Rash  . Sulfa Antibiotics Rash     Physical Exam:   Vitals  Blood pressure (!) 78/45, pulse (!) 101, temperature (!) 100.7 F (38.2 C), temperature source Oral, resp. rate 16, height 5' 6"  (1.676 m), weight 74.8 kg, SpO2 93 %.  Physical Examination: General appearance - alert, well appearing, and in no distress Mental status -  alert, oriented to person, place, and time,  Eyes - sclera anicteric Neck - supple, no JVD elevation , Chest - clear  to auscultation bilaterally, symmetrical air movement,  Heart - S1 and S2 normal, regular  Abdomen - soft, nontender, nondistended, no masses or organomegaly Neurological - screening mental status exam normal, neck supple without rigidity, cranial nerves II through XII intact, DTR's normal and symmetric Extremities - no pedal edema noted, intact peripheral pulses  Skin - warm, dry     Data Review:    CBC Recent Labs  Lab 01/21/20 0838  WBC 15.4*  HGB 12.4*  HCT 38.0*  PLT 180  MCV 96.2  MCH 31.4  MCHC 32.6  RDW 13.9   ------------------------------------------------------------------------------------------------------------------  Chemistries  Recent Labs  Lab 01/21/20 0838  NA 132*  K 3.9  CL 95*  CO2 25  GLUCOSE 117*  BUN 36*  CREATININE 1.96*  CALCIUM 9.1  AST 75*  ALT 74*  ALKPHOS 77  BILITOT 0.9   ------------------------------------------------------------------------------------------------------------------ estimated creatinine clearance is 28 mL/min (A) (by C-G formula based on SCr of 1.96 mg/dL (H)). ------------------------------------------------------------------------------------------------------------------ No results for input(s): TSH, T4TOTAL, T3FREE, THYROIDAB in the last 72 hours.  Invalid input(s): FREET3   Coagulation profile No results for input(s): INR, PROTIME in the last 168  hours. ------------------------------------------------------------------------------------------------------------------- No results for input(s): DDIMER in the last 72 hours. -------------------------------------------------------------------------------------------------------------------  Cardiac Enzymes No results for input(s): CKMB, TROPONINI, MYOGLOBIN in the last 168 hours.  Invalid input(s): CK ------------------------------------------------------------------------------------------------------------------ No results found for: BNP   ---------------------------------------------------------------------------------------------------------------  Urinalysis    Component Value Date/Time   COLORURINE AMBER (A) 01/21/2020 1038   APPEARANCEUR CLOUDY (A) 01/21/2020 1038   LABSPEC 1.021 01/21/2020 1038   PHURINE 5.0 01/21/2020 1038   GLUCOSEU NEGATIVE 01/21/2020 1038   HGBUR SMALL (A) 01/21/2020 1038   BILIRUBINUR NEGATIVE 01/21/2020 1038   KETONESUR NEGATIVE 01/21/2020 1038   PROTEINUR 100 (A) 01/21/2020 1038   UROBILINOGEN 0.2 10/29/2007 2036   NITRITE NEGATIVE 01/21/2020 1038   LEUKOCYTESUR NEGATIVE 01/21/2020 1038    ----------------------------------------------------------------------------------------------------------------   Imaging Results:    MR 3D Recon At Scanner  Result Date: 01/21/2020 CLINICAL DATA:  Evaluate liver and pancreas lesions EXAM: MRI ABDOMEN WITHOUT AND WITH CONTRAST (INCLUDING MRCP) TECHNIQUE: Multiplanar multisequence MR imaging of the abdomen was performed both before and after the administration of intravenous contrast. Heavily T2-weighted images of the biliary and pancreatic ducts were obtained, and three-dimensional MRCP images were rendered by post processing. CONTRAST:  35m GADAVIST GADOBUTROL 1 MMOL/ML IV SOLN COMPARISON:  Same day CT abdomen pelvis, 01/21/2020, 04/08/2016, 10/29/2007 FINDINGS: Examination is generally limited by extensive  breath motion artifact throughout. Lower chest: Trace pleural effusions. Hepatobiliary: There is a subcapsular mass in the inferior tip of the right lobe of the liver, hepatic segment VI, measuring 3.3 x 2.5 cm, with slight heterogeneous internal T2 hyperintensity, diffusion restriction, and demonstrating very gradual progressive peripheral nodular enhancement (series 19, image 49). An additional lesion in the posterior right lobe of the liver, hepatic segment VI, measures 0.8 cm and is too small to characterize although likely a subcentimeter cyst or hemangioma. Multiple small gallstones in the dependent gallbladder. Pancreas: There is a lobulated fluid signal, nonenhancing lesion of the pancreatic head immediately adjacent to the central common bile duct measuring 1.5 x 1.4 cm (series 22, image 25, series 4, image 22). No pancreatic ductal dilatation. Spleen:  Within normal limits in size and appearance. Adrenals/Urinary Tract: No masses identified. No evidence of hydronephrosis. Stomach/Bowel: Visualized  portions within the abdomen are unremarkable. Vascular/Lymphatic: No pathologically enlarged lymph nodes identified. No abdominal aortic aneurysm demonstrated. Other:  None. Musculoskeletal: No suspicious bone lesions identified. IMPRESSION: 1. Examination is limited by extensive breath motion artifact throughout. 2. There is a subcapsular mass in the inferior tip of the right lobe of the liver, hepatic segment VI, measuring 3.3 x 2.5 cm, with very gradual progressive peripheral nodular enhancement. 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 3. An additional lesion in the posterior right lobe of the liver, hepatic segment VI, measures 0.8 cm and is too small to characterize although statistically likely a subcentimeter cyst or hemangioma. 4. There is a lobulated fluid signal, nonenhancing lesion of the pancreatic  head immediately adjacent to the central common bile duct measuring 1.5 cm. 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 No further follow-up is required. This recommendation follows ACR consensus guidelines: Management of Incidental Pancreatic Cysts: A White Paper of the ACR Incidental Findings Committee. [place] 5726;20:[id number]. Electronically Signed   By: Eddie Candle M.D.   On: 01/21/2020 13:57   MR ABDOMEN MRCP W WO CONTAST  Result Date: 01/21/2020 CLINICAL DATA:  Evaluate liver and pancreas lesions EXAM: MRI ABDOMEN WITHOUT AND WITH CONTRAST (INCLUDING MRCP) TECHNIQUE: Multiplanar multisequence MR imaging of the abdomen was performed both before and after the administration of intravenous contrast. Heavily T2-weighted images of the biliary and pancreatic ducts were obtained, and three-dimensional MRCP images were rendered by post processing. CONTRAST:  55m GADAVIST GADOBUTROL 1 MMOL/ML IV SOLN COMPARISON:  Same day CT abdomen pelvis, 01/21/2020, 04/08/2016, 10/29/2007 FINDINGS: Examination is generally limited by extensive breath motion artifact throughout. Lower chest: Trace pleural effusions. Hepatobiliary: There is a subcapsular mass in the inferior tip of the right lobe of the liver, hepatic segment VI, measuring 3.3 x 2.5 cm, with slight heterogeneous internal T2 hyperintensity, diffusion restriction, and demonstrating very gradual progressive peripheral nodular enhancement (series 19, image 49). An additional lesion in the posterior right lobe of the liver, hepatic segment VI, measures 0.8 cm and is too small to characterize although likely a subcentimeter cyst or hemangioma. Multiple small gallstones in the dependent gallbladder. Pancreas: There is a lobulated fluid signal, nonenhancing lesion of the pancreatic head immediately adjacent to the central common bile duct measuring 1.5 x  1.4 cm (series 22, image 25, series 4, image 22). No pancreatic ductal dilatation. Spleen:  Within normal limits in size and appearance. Adrenals/Urinary Tract: No masses identified. No evidence of hydronephrosis. Stomach/Bowel: Visualized portions within the abdomen are unremarkable. Vascular/Lymphatic: No pathologically enlarged lymph nodes identified. No abdominal aortic aneurysm demonstrated. Other:  None. Musculoskeletal: No suspicious bone lesions identified. IMPRESSION: 1. Examination is limited by extensive breath motion artifact throughout. 2. There is a subcapsular mass in the inferior tip of the right lobe of the liver, hepatic segment VI, measuring 3.3 x 2.5 cm, with very gradual progressive peripheral nodular enhancement. 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 3. An additional lesion in the posterior right lobe of the liver, hepatic segment VI, measures 0.8 cm and is too small to characterize although statistically likely a subcentimeter cyst or hemangioma. 4. There is a lobulated fluid signal, nonenhancing lesion of the pancreatic head immediately adjacent to the central  common bile duct measuring 1.5 cm. 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 No further follow-up is required. This recommendation follows ACR consensus guidelines: Management of Incidental Pancreatic Cysts: A White Paper of the ACR Incidental Findings Committee. [place] [id number]. Electronically Signed   By: Eddie Candle M.D.   On: 01/21/2020 13:57   CT RENAL STONE STUDY  Result Date: 01/21/2020 CLINICAL DATA:  Back pain, dysuria EXAM: CT ABDOMEN AND PELVIS WITHOUT CONTRAST TECHNIQUE: Multidetector CT imaging of the abdomen and pelvis was performed following the standard protocol without IV contrast.  COMPARISON:  03/08/2016 FINDINGS: Lower chest: 5 mm noncalcified posterior right lower lobe pulmonary nodule (series 4, image 40), new from prior. Mild right greater than left bibasilar subsegmental atelectasis. Coronary artery calcifications. Hepatobiliary: Slightly ill-defined 3.0 cm lesion within the inferior aspect of the right hepatic lobe (series 2, image 38), new from prior. There is at least 1 additional subcentimeter indeterminate lesions within the liver (series 2, image 28), not definitively seen on prior. Cholelithiasis. Gallbladder is mildly distended. No biliary dilatation. Pancreas: Subtle area of relative low attenuation within the posterior aspect of the pancreatic head which appears new from prior (series 2, images 28-29). No peripancreatic stranding. No pancreatic ductal dilatation. No peripancreatic fluid. Spleen: Normal in size without focal abnormality. Adrenals/Urinary Tract: Adrenal glands are unremarkable. Kidneys are normal, without renal calculi, focal lesion, or hydronephrosis. Bladder is decompressed, limiting its evaluation. Stomach/Bowel: Stomach is within normal limits. Appendix not visualized, and may be surgically absent. Scattered colonic diverticulosis. No evidence of bowel wall thickening, distention, or inflammatory changes. Vascular/Lymphatic: Aortoiliac atherosclerotic calcification without aneurysm. Mild ectasia of the right common iliac artery, unchanged. No enlarged abdominal or pelvic lymph nodes. Reproductive: Prostatectomy. Other: No abdominal wall hernia or abnormality. No abdominopelvic ascites. Musculoskeletal: L3-5 PLIF. No acute osseous findings. The sclerotic osseous lesions seen on prior CT 03/08/2016 have resolved. IMPRESSION: 1. Subtle area of relative low attenuation within the posterior aspect of the pancreatic head which appears new from prior. Pancreatic mass is not excluded. When the patient is clinically stable and able to follow directions and hold their  breath (preferably as an outpatient) further evaluation with dedicated contrast-enhanced pancreatic protocol MRI of the abdomen should be considered. 2. New slightly ill-defined 3.0 cm lesion within the inferior aspect of the right hepatic lobe with at least 1 additional subcentimeter indeterminate lesions within the liver. Findings are nonspecific but could reflect hepatic metastatic disease. These findings can also be further evaluated on the previously recommended MRI. 3. 5 mm noncalcified right lower lobe pulmonary nodule, new from prior. 4. The sclerotic osseous lesions seen on prior CT 03/08/2016 have resolved. 5. Cholelithiasis without evidence of acute cholecystitis. 6. Colonic diverticulosis without evidence of acute diverticulitis. 7. Aortic atherosclerosis. (ICD10-I70.0). Electronically Signed   By: Davina Poke D.O.   On: 01/21/2020 09:44    Radiological Exams on Admission: MR 3D Recon At Scanner  Result Date: 01/21/2020 CLINICAL DATA:  Evaluate liver and pancreas lesions EXAM: MRI ABDOMEN WITHOUT AND WITH CONTRAST (INCLUDING MRCP) TECHNIQUE: Multiplanar multisequence MR imaging of the abdomen was performed both before and after the administration of intravenous contrast. Heavily T2-weighted images of the biliary and pancreatic ducts were obtained, and three-dimensional MRCP images were rendered by post processing. CONTRAST:  65m GADAVIST GADOBUTROL 1 MMOL/ML IV SOLN COMPARISON:  Same day CT abdomen pelvis, 01/21/2020, 04/08/2016, 10/29/2007 FINDINGS: Examination is  generally limited by extensive breath motion artifact throughout. Lower chest: Trace pleural effusions. Hepatobiliary: There is a subcapsular mass in the inferior tip of the right lobe of the liver, hepatic segment VI, measuring 3.3 x 2.5 cm, with slight heterogeneous internal T2 hyperintensity, diffusion restriction, and demonstrating very gradual progressive peripheral nodular enhancement (series 19, image 49). An additional  lesion in the posterior right lobe of the liver, hepatic segment VI, measures 0.8 cm and is too small to characterize although likely a subcentimeter cyst or hemangioma. Multiple small gallstones in the dependent gallbladder. Pancreas: There is a lobulated fluid signal, nonenhancing lesion of the pancreatic head immediately adjacent to the central common bile duct measuring 1.5 x 1.4 cm (series 22, image 25, series 4, image 22). No pancreatic ductal dilatation. Spleen:  Within normal limits in size and appearance. Adrenals/Urinary Tract: No masses identified. No evidence of hydronephrosis. Stomach/Bowel: Visualized portions within the abdomen are unremarkable. Vascular/Lymphatic: No pathologically enlarged lymph nodes identified. No abdominal aortic aneurysm demonstrated. Other:  None. Musculoskeletal: No suspicious bone lesions identified. IMPRESSION: 1. Examination is limited by extensive breath motion artifact throughout. 2. There is a subcapsular mass in the inferior tip of the right lobe of the liver, hepatic segment VI, measuring 3.3 x 2.5 cm, with very gradual progressive peripheral nodular enhancement. 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 3. An additional lesion in the posterior right lobe of the liver, hepatic segment VI, measures 0.8 cm and is too small to characterize although statistically likely a subcentimeter cyst or hemangioma. 4. There is a lobulated fluid signal, nonenhancing lesion of the pancreatic head immediately adjacent to the central common bile duct measuring 1.5 cm. 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 No further follow-up is required. This recommendation follows ACR consensus guidelines: Management of Incidental Pancreatic Cysts: A White Paper of the ACR Incidental  Findings Committee. [place] 3500;[id number]. Electronically Signed   By: Eddie Candle M.D.   On: 01/21/2020 13:57   MR ABDOMEN MRCP W WO CONTAST  Result Date: 01/21/2020 CLINICAL DATA:  Evaluate liver and pancreas lesions EXAM: MRI ABDOMEN WITHOUT AND WITH CONTRAST (INCLUDING MRCP) TECHNIQUE: Multiplanar multisequence MR imaging of the abdomen was performed both before and after the administration of intravenous contrast. Heavily T2-weighted images of the biliary and pancreatic ducts were obtained, and three-dimensional MRCP images were rendered by post processing. CONTRAST:  7m GADAVIST GADOBUTROL 1 MMOL/ML IV SOLN COMPARISON:  Same day CT abdomen pelvis, 01/21/2020, 04/08/2016, 10/29/2007 FINDINGS: Examination is generally limited by extensive breath motion artifact throughout. Lower chest: Trace pleural effusions. Hepatobiliary: There is a subcapsular mass in the inferior tip of the right lobe of the liver, hepatic segment VI, measuring 3.3 x 2.5 cm, with slight heterogeneous internal T2 hyperintensity, diffusion restriction, and demonstrating very gradual progressive peripheral nodular enhancement (series 19, image 49). An additional lesion in the posterior right lobe of the liver, hepatic segment VI, measures 0.8 cm and is too small to characterize although likely a subcentimeter cyst or hemangioma. Multiple small gallstones in the dependent gallbladder. Pancreas: There is a lobulated fluid signal, nonenhancing lesion of the pancreatic head immediately adjacent to the central common bile duct measuring 1.5 x 1.4 cm (series 22, image 25, series 4, image 22). No pancreatic ductal dilatation. Spleen:  Within normal limits in size and  appearance. Adrenals/Urinary Tract: No masses identified. No evidence of hydronephrosis. Stomach/Bowel: Visualized portions within the abdomen are unremarkable. Vascular/Lymphatic: No pathologically enlarged lymph nodes identified. No abdominal aortic aneurysm  demonstrated. Other:  None. Musculoskeletal: No suspicious bone lesions identified. IMPRESSION: 1. Examination is limited by extensive breath motion artifact throughout. 2. There is a subcapsular mass in the inferior tip of the right lobe of the liver, hepatic segment VI, measuring 3.3 x 2.5 cm, with very gradual progressive peripheral nodular enhancement. 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 3. An additional lesion in the posterior right lobe of the liver, hepatic segment VI, measures 0.8 cm and is too small to characterize although statistically likely a subcentimeter cyst or hemangioma. 4. There is a lobulated fluid signal, nonenhancing lesion of the pancreatic head immediately adjacent to the central common bile duct measuring 1.5 cm. 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 No further follow-up is required. This recommendation follows ACR consensus guidelines: Management of Incidental Pancreatic Cysts: A White Paper of the ACR Incidental Findings Committee. [place] [id number]. Electronically Signed   By: Eddie Candle M.D.   On: 01/21/2020 13:57   CT RENAL STONE STUDY  Result Date: 01/21/2020 CLINICAL DATA:  Back pain, dysuria EXAM: CT ABDOMEN AND PELVIS WITHOUT CONTRAST TECHNIQUE: Multidetector CT imaging of the abdomen and pelvis was performed following the standard protocol without IV contrast. COMPARISON:  03/08/2016 FINDINGS: Lower chest: 5 mm noncalcified posterior right lower lobe pulmonary nodule (series 4, image 40), new from prior. Mild right greater than left bibasilar subsegmental atelectasis. Coronary artery calcifications. Hepatobiliary: Slightly ill-defined 3.0 cm lesion within the inferior aspect of the right hepatic lobe (series 2, image 38), new from prior. There  is at least 1 additional subcentimeter indeterminate lesions within the liver (series 2, image 28), not definitively seen on prior. Cholelithiasis. Gallbladder is mildly distended. No biliary dilatation. Pancreas: Subtle area of relative low attenuation within the posterior aspect of the pancreatic head which appears new from prior (series 2, images 28-29). No peripancreatic stranding. No pancreatic ductal dilatation. No peripancreatic fluid. Spleen: Normal in size without focal abnormality. Adrenals/Urinary Tract: Adrenal glands are unremarkable. Kidneys are normal, without renal calculi, focal lesion, or hydronephrosis. Bladder is decompressed, limiting its evaluation. Stomach/Bowel: Stomach is within normal limits. Appendix not visualized, and may be surgically absent. Scattered colonic diverticulosis. No evidence of bowel wall thickening, distention, or inflammatory changes. Vascular/Lymphatic: Aortoiliac atherosclerotic calcification without aneurysm. Mild ectasia of the right common iliac artery, unchanged. No enlarged abdominal or pelvic lymph nodes. Reproductive: Prostatectomy. Other: No abdominal wall hernia or abnormality. No abdominopelvic ascites. Musculoskeletal: L3-5 PLIF. No acute osseous findings. The sclerotic osseous lesions seen on prior CT 03/08/2016 have resolved. IMPRESSION: 1. Subtle area of relative low attenuation within the posterior aspect of the pancreatic head which appears new from prior. Pancreatic mass is not excluded. When the patient is clinically stable and able to follow directions and hold their breath (preferably as an outpatient) further evaluation with dedicated contrast-enhanced pancreatic protocol MRI of the abdomen should be considered. 2. New slightly ill-defined 3.0 cm lesion within the inferior aspect of the right hepatic lobe with at least 1 additional subcentimeter indeterminate lesions within the liver. Findings are nonspecific but could reflect hepatic metastatic  disease. These findings can also be further evaluated on the  previously recommended MRI. 3. 5 mm noncalcified right lower lobe pulmonary nodule, new from prior. 4. The sclerotic osseous lesions seen on prior CT 03/08/2016 have resolved. 5. Cholelithiasis without evidence of acute cholecystitis. 6. Colonic diverticulosis without evidence of acute diverticulitis. 7. Aortic atherosclerosis. (ICD10-I70.0). Electronically Signed   By: Davina Poke D.O.   On: 01/21/2020 09:44    DVT Prophylaxis -SCD /heparin AM Labs Ordered, also please review Full Orders  Family Communication: Admission, patients condition and plan of care including tests being ordered have been discussed with the patient and his spouse Ms Katharine Look UWTKT---[phone] who indicate understanding and agree with the plan   Code Status - Full Code  Likely DC to  Home with resolution of sepsis pathophysiology  Condition hemodynamically stable  Roxan Hockey M.D on 01/21/2020 at 8:48 PM Go to [email or web address] -  for contact info  [facility] - Office  [phone]

## 2020-01-21 NOTE — ED Provider Notes (Signed)
Blood pressure (!) 78/45, pulse (!) 101, temperature (!) 100.7 F (38.2 C), temperature source Oral, resp. rate 16, height 5\' 6"  (1.676 m), weight 74.8 kg, SpO2 93 %.  In short, Samuel Gregory is a 79 y.o. male with a chief complaint of Back Pain .  Refer to the original H&P for additional details.  08:28 PM  Called by the hospitalist to discuss the patient.  He has remained persistently hypotensive despite 3 L IV fluids.  He would like to start pressors and is requesting that I placed central line to facilitate this. Will evaluate for central access.   Discussed the procedure with the patient. He is awake and alert and able to verbalize the procedure and risks discussed. He would like to discuss with his wife before consenting to the procedure.   10:48 PM  Patient tolerated the procedure well without complication.  See procedure note below. Hospitalist to continue patient care. All ports flush with steady venous ooze. No arterial puncture during procedure. Line is ready for use.   Linus Salmons Line  Date/Time: 01/21/2020 10:49 PM Performed by: Margette Fast, MD Authorized by: Margette Fast, MD   Consent:    Consent obtained:  Written   Consent given by:  Patient   Risks discussed:  Arterial puncture, bleeding, infection, incorrect placement and nerve damage Universal protocol:    Procedure explained and questions answered to patient or proxy's satisfaction: yes     Relevant documents present and verified: yes     Test results available and properly labeled: yes     Immediately prior to procedure, a time out was called: yes     Patient identity confirmed:  Verbally with patient, arm band and hospital-assigned identification number Pre-procedure details:    Hand hygiene: Hand hygiene performed prior to insertion     Sterile barrier technique: All elements of maximal sterile technique followed     Skin preparation:  2% chlorhexidine   Skin preparation agent: Skin preparation agent  completely dried prior to procedure   Anesthesia (see MAR for exact dosages):    Anesthesia method:  Local infiltration   Local anesthetic:  Lidocaine 1% w/o epi Procedure details:    Location:  R femoral   Patient position:  Flat   Procedural supplies:  Triple lumen   Landmarks identified: yes     Ultrasound guidance: yes     Sterile ultrasound techniques: Sterile gel and sterile probe covers were used     Number of attempts:  1   Successful placement: yes   Post-procedure details:    Post-procedure:  Dressing applied and line sutured   Assessment:  Blood return through all ports and free fluid flow   Patient tolerance of procedure:  Tolerated well, no immediate complications       Errin Whitelaw, Wonda Olds, MD 01/21/20 2251

## 2020-01-21 NOTE — ED Notes (Signed)
Pt. With MRI

## 2020-01-22 ENCOUNTER — Encounter (HOSPITAL_COMMUNITY): Payer: Self-pay | Admitting: Family Medicine

## 2020-01-22 DIAGNOSIS — Z452 Encounter for adjustment and management of vascular access device: Secondary | ICD-10-CM

## 2020-01-22 DIAGNOSIS — Z7189 Other specified counseling: Secondary | ICD-10-CM

## 2020-01-22 DIAGNOSIS — R6521 Severe sepsis with septic shock: Secondary | ICD-10-CM

## 2020-01-22 DIAGNOSIS — C61 Malignant neoplasm of prostate: Secondary | ICD-10-CM

## 2020-01-22 DIAGNOSIS — R109 Unspecified abdominal pain: Secondary | ICD-10-CM

## 2020-01-22 DIAGNOSIS — R7881 Bacteremia: Secondary | ICD-10-CM

## 2020-01-22 DIAGNOSIS — B955 Unspecified streptococcus as the cause of diseases classified elsewhere: Secondary | ICD-10-CM

## 2020-01-22 DIAGNOSIS — A419 Sepsis, unspecified organism: Secondary | ICD-10-CM

## 2020-01-22 DIAGNOSIS — Z515 Encounter for palliative care: Secondary | ICD-10-CM

## 2020-01-22 LAB — COMPREHENSIVE METABOLIC PANEL
ALT: 141 U/L — ABNORMAL HIGH (ref 0–44)
AST: 153 U/L — ABNORMAL HIGH (ref 15–41)
Albumin: 2.3 g/dL — ABNORMAL LOW (ref 3.5–5.0)
Alkaline Phosphatase: 75 U/L (ref 38–126)
Anion gap: 13 (ref 5–15)
BUN: 30 mg/dL — ABNORMAL HIGH (ref 8–23)
CO2: 21 mmol/L — ABNORMAL LOW (ref 22–32)
Calcium: 8.6 mg/dL — ABNORMAL LOW (ref 8.9–10.3)
Chloride: 103 mmol/L (ref 98–111)
Creatinine, Ser: 1.38 mg/dL — ABNORMAL HIGH (ref 0.61–1.24)
GFR calc Af Amer: 56 mL/min — ABNORMAL LOW (ref >60)
GFR calc non Af Amer: 49 mL/min — ABNORMAL LOW (ref >60)
Glucose, Bld: 90 mg/dL (ref 70–99)
Potassium: 4.2 mmol/L (ref 3.5–5.1)
Sodium: 137 mmol/L (ref 135–145)
Total Bilirubin: 0.8 mg/dL (ref 0.3–1.2)
Total Protein: 5.7 g/dL — ABNORMAL LOW (ref 6.5–8.1)

## 2020-01-22 LAB — BLOOD CULTURE ID PANEL (REFLEXED)

## 2020-01-22 LAB — CBC
HCT: 36.3 % — ABNORMAL LOW (ref 39.0–52.0)
Hemoglobin: 11.5 g/dL — ABNORMAL LOW (ref 13.0–17.0)
MCH: 30.7 pg (ref 26.0–34.0)
MCHC: 31.7 g/dL (ref 30.0–36.0)
MCV: 97.1 fL (ref 80.0–100.0)
Platelets: 127 10*3/uL — ABNORMAL LOW (ref 150–400)
RBC: 3.74 MIL/uL — ABNORMAL LOW (ref 4.22–5.81)
RDW: 14 % (ref 11.5–15.5)
WBC: 10.1 10*3/uL (ref 4.0–10.5)
nRBC: 0 % (ref 0.0–0.2)

## 2020-01-22 LAB — PROTIME-INR
INR: 1.2 (ref 0.8–1.2)
Prothrombin Time: 14.6 seconds (ref 11.4–15.2)

## 2020-01-22 LAB — CEA: CEA: 4.3 ng/mL (ref 0.0–4.7)

## 2020-01-22 LAB — MRSA PCR SCREENING: MRSA by PCR: NEGATIVE

## 2020-01-22 LAB — APTT: aPTT: 36 seconds (ref 24–36)

## 2020-01-22 LAB — CANCER ANTIGEN 19-9: CA 19-9: 16 U/mL (ref 0–35)

## 2020-01-22 MED ORDER — CHLORHEXIDINE GLUCONATE CLOTH 2 % EX PADS
6.0000 | MEDICATED_PAD | Freq: Every day | CUTANEOUS | Status: DC
  Administered 2020-01-22 – 2020-01-27 (×6): 6 via TOPICAL

## 2020-01-22 MED ORDER — FLUCONAZOLE 100MG IVPB
100.0000 mg | INTRAVENOUS | Status: AC
  Administered 2020-01-22 – 2020-01-24 (×3): 100 mg via INTRAVENOUS
  Filled 2020-01-22 (×3): qty 50

## 2020-01-22 MED ORDER — PREDNISONE 10 MG PO TABS
5.0000 mg | ORAL_TABLET | Freq: Two times a day (BID) | ORAL | Status: DC
  Administered 2020-01-22 – 2020-01-27 (×10): 5 mg via ORAL
  Filled 2020-01-22 (×10): qty 1

## 2020-01-22 NOTE — Progress Notes (Signed)
PHARMACY - PHYSICIAN COMMUNICATION CRITICAL VALUE ALERT - BLOOD CULTURE IDENTIFICATION (BCID)  Samuel Gregory is an 79 y.o. male who presented to [facility] on 01/21/2020   Assessment:  Blood culture BCID: strep species    Current antibiotics:  Cefepime 2000 mg IV every 12 hours Flagyl 500 mg IV every 8 hours Vancomycin 1000 mg IV every 24 hours.  Changes to prescribed antibiotics recommended:  Continue antibiotics for now pending full culture data per MD.  Strep species can be covered by ceftriaxone 2000 mg IV daily.  Results for orders placed or performed during the hospital encounter of 01/21/20  Blood Culture ID Panel (Reflexed) (Collected: 01/21/2020  9:04 AM)  Result Value Ref Range   Enterococcus species NOT DETECTED NOT DETECTED   Listeria monocytogenes NOT DETECTED NOT DETECTED   Staphylococcus species NOT DETECTED NOT DETECTED   Staphylococcus aureus (BCID) NOT DETECTED NOT DETECTED   Streptococcus species DETECTED (A) NOT DETECTED   Streptococcus agalactiae NOT DETECTED NOT DETECTED   Streptococcus pneumoniae NOT DETECTED NOT DETECTED   Streptococcus pyogenes NOT DETECTED NOT DETECTED   Acinetobacter baumannii NOT DETECTED NOT DETECTED   Enterobacteriaceae species NOT DETECTED NOT DETECTED   Enterobacter cloacae complex NOT DETECTED NOT DETECTED   Escherichia coli NOT DETECTED NOT DETECTED   Klebsiella oxytoca NOT DETECTED NOT DETECTED   Klebsiella pneumoniae NOT DETECTED NOT DETECTED   Proteus species NOT DETECTED NOT DETECTED   Serratia marcescens NOT DETECTED NOT DETECTED   Haemophilus influenzae NOT DETECTED NOT DETECTED   Neisseria meningitidis NOT DETECTED NOT DETECTED   Pseudomonas aeruginosa NOT DETECTED NOT DETECTED   Candida albicans NOT DETECTED NOT DETECTED   Candida glabrata NOT DETECTED NOT DETECTED   Candida krusei NOT DETECTED NOT DETECTED   Candida parapsilosis NOT DETECTED NOT DETECTED   Candida tropicalis NOT DETECTED NOT DETECTED    Ramond Craver 01/22/2020  4:40 PM

## 2020-01-22 NOTE — ED Notes (Signed)
Pt placed on male purewick 

## 2020-01-22 NOTE — Consult Note (Addendum)
Name: Samuel Gregory MRN: [id number] DOB: [birth year]    ADMISSION DATE:  01/21/2020 CONSULTATION DATE:  01/22/2020   REFERRING MD :  Aline August  CHIEF COMPLAINT: Fever chills  BRIEF PATIENT DESCRIPTION: 79 year old metastatic prostate cancer admitted with sepsis syndrome ?Cholangitis  SIGNIFICANT EVENTS    Blood cx 6/22 >> GPC >> strep   STUDIES:  MRI abdomen/MRCP 6/22 >> subcapsular mass in the inferior tip of the right lobe of the liver, hepatic segment VI, measuring 3.3 x 2.5 cm, with very gradual progressive peripheral nodular enhancement. 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 An additional lesion in the posterior right lobe of the liver, hepatic segment VI, measures 0.8 cm and is too small to characterize although statistically likely a subcentimeter cyst or hemangioma. There is a lobulated fluid signal, nonenhancing lesion of the pancreatic head immediately adjacent to the central common bile duct measuring 1.5 cm. 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    HISTORY OF PRESENT ILLNESS: 79 year old with metastatic prostate cancer admitted with fever chills , lower abdominal pain and dysuria. Initial evaluation showed leukocytosis, BUN/creatinine of 36/1.9, elevated AST ALT , normal lipase ,Normal alkaline phosphatase and mild hyponatremia UA showed 11-20 white cells with clumps He was hypotensive, lactate was normal, transiently required Levophed which has been tapered to off Abdominal imaging has suggested possibility of cholangiocarcinoma Blood cultures now growing gram-positive cocci, presumed strep  He received 2 injections in his back last week for back and hip pain   PAST MEDICAL HISTORY :   has a past medical history of Arthritis, Collagen vascular  disease ([place]), Coronary atherosclerosis of native coronary artery, Essential hypertension, Hyperlipidemia, and Prostate cancer ([place]).  has a past surgical history that includes Coronary artery bypass graft (01/1999); Prostatectomy (2004); Appendectomy; Tonsillectomy; Cataract extraction w/PHACO (Left, 07/14/2014); Cataract extraction w/PHACO (Right, 08/11/2014); and Colonoscopy (N/A, 01/14/2015). Prior to Admission medications   Medication Sig Start Date End Date Taking? Authorizing Provider  aspirin EC 81 MG tablet Take 1 tablet (81 mg total) by mouth daily. 12/06/18  Yes Satira Sark, MD  Calcium-Magnesium-Vitamin D (CALCIUM 1200+D3 PO) Take 1 tablet by mouth daily.    Yes [provider]  fluticasone (FLONASE) 50 MCG/ACT nasal spray Place 1 spray into both nostrils daily as needed for allergies or rhinitis.   Yes [provider]  methocarbamol (ROBAXIN) 750 MG tablet Take 750 mg by mouth 3 (three) times daily. 11/18/19  Yes [provider]  metoprolol succinate (TOPROL-XL) 25 MG 24 hr tablet TAKE (1) TABLET BY MOUTH DAILY OR AS OTHERWISE DIRECTED. 08/01/19  Yes Satira Sark, MD  Multiple Vitamin (MULTIVITAMIN WITH MINERALS) TABS Take 1 tablet by mouth daily.   Yes [provider]  naproxen sodium (ANAPROX) 220 MG tablet Take 220 mg by mouth daily as needed (pain).   Yes [provider]  predniSONE (DELTASONE) 5 MG tablet Take 1 tablet by mouth 2 (two) times daily. 09/20/16  Yes [provider]  ramipril (ALTACE) 5 MG capsule TAKE (1) CAPSULE BY MOUTH ONCE DAILY. 02/27/19  Yes Satira Sark, MD  rosuvastatin (CRESTOR) 40 MG tablet Take 1 tablet (40 mg total) by mouth daily after supper. 11/29/19 02/27/20 Yes Satira Sark, MD  Thiamine HCl (VITAMIN B-1 PO) Take 1 tablet by mouth daily.   Yes  [provider]  Vitamins/Minerals TABS Take by mouth.   Yes [provider]  ZYTIGA 250 MG tablet Take 4 tablets by mouth  daily. 09/20/16  Yes [provider]  nitroGLYCERIN (NITROSTAT) 0.4 MG SL tablet Place 1 tablet (0.4 mg total) under the tongue every 5 (five) minutes x 3 doses as needed for chest pain (if no relief after 3rd dose, proceed to the ED for an evaluation or call 911). 12/06/18 03/06/19  Satira Sark, MD  predniSONE (DELTASONE) 20 MG tablet Take 20 mg by mouth 2 (two) times daily. Patient not taking: Reported on 01/21/2020 11/18/19   [provider]   Allergies  Allergen Reactions   Latex Rash   Sulfa Antibiotics Rash    FAMILY HISTORY:  family history includes Aneurysm in his father; Cancer in his mother; Heart attack in his mother. SOCIAL HISTORY:  reports that he has never smoked. He has never used smokeless tobacco. He reports current alcohol use. He reports that he does not use drugs.  REVIEW OF SYSTEMS:   Constitutional: Positive for fever, chills, weight loss, malaise/fatigue and diaphoresis.  HENT: Negative for hearing loss, ear pain, nosebleeds, congestion, sore throat, neck pain, tinnitus and ear discharge.   Eyes: Negative for blurred vision, double vision, photophobia, pain, discharge and redness.  Respiratory: Negative for cough, hemoptysis, sputum production, shortness of breath, wheezing and stridor.   Cardiovascular: Negative for chest pain, palpitations, orthopnea, claudication, leg swelling and PND.  Gastrointestinal: + abdominal pain,  Negative for heartburn, nausea, vomiting, diarrhea, constipation, blood in stool and melena.  Genitourinary:  + dysuria, urgency, Negative for frequency, hematuria and flank pain.  Musculoskeletal: Negative for myalgias, back pain, joint pain and falls.  Skin: Negative for itching and rash.  Neurological: Negative for dizziness, tingling, tremors, sensory change, speech change, focal weakness, seizures, loss of consciousness, weakness and headaches.  Endo/Heme/Allergies: Negative for environmental allergies and polydipsia.  Does not bruise/bleed easily.  SUBJECTIVE:   VITAL SIGNS: Temp:  [98 F (36.7 C)-102.1 F (38.9 C)] 98.8 F (37.1 C) (06/23 1117) Pulse Rate:  [69-148] 142 (06/23 1200) Resp:  [15-29] 26 (06/23 1200) BP: (75-157)/(34-107) 119/77 (06/23 1200) SpO2:  [91 %-99 %] 97 % (06/23 1200) Weight:  [73.2 kg] 73.2 kg (06/23 0500)  PHYSICAL EXAMINATION: Gen. Pleasant, well-nourished,elderly, in no distress, normal affect ENT - no pallor,icterus, no post nasal drip Neck: No JVD, no thyromegaly, no carotid bruits Lungs: no use of accessory muscles, no dullness to percussion, clear without rales or rhonchi  Cardiovascular: Rhythm regular, heart sounds  normal, no murmurs or gallops, no peripheral edema Abdomen: soft and non-tender, no hepatosplenomegaly, BS normal. Musculoskeletal: No deformities, no cyanosis or clubbing Neuro:  alert, non focal  Labs show improved renal function, decrease leukocytosis, improved sodium -Elevated LFTs   Recent Labs  Lab 01/21/20 0838 01/21/20 2057 01/22/20 0430  NA 132*  --  137  K 3.9  --  4.2  CL 95*  --  103  CO2 25  --  21*  BUN 36*  --  30*  CREATININE 1.96* 1.50* 1.38*  GLUCOSE 117*  --  90   Recent Labs  Lab 01/21/20 0838 01/22/20 0430  HGB 12.4* 11.5*  HCT 38.0* 36.3*  WBC 15.4* 10.1  PLT 180 127*   MR 3D Recon At Scanner  Result Date: 01/21/2020 CLINICAL DATA:  Evaluate liver and pancreas lesions EXAM: MRI ABDOMEN WITHOUT AND WITH CONTRAST (INCLUDING MRCP) TECHNIQUE: Multiplanar multisequence MR imaging of the abdomen  was performed both before and after the administration of intravenous contrast. Heavily T2-weighted images of the biliary and pancreatic ducts were obtained, and three-dimensional MRCP images were rendered by post processing. CONTRAST:  8mL GADAVIST GADOBUTROL 1 MMOL/ML IV SOLN COMPARISON:  Same day CT abdomen pelvis, 01/21/2020, 04/08/2016, 10/29/2007 FINDINGS: Examination is generally limited by extensive breath motion  artifact throughout. Lower chest: Trace pleural effusions. Hepatobiliary: There is a subcapsular mass in the inferior tip of the right lobe of the liver, hepatic segment VI, measuring 3.3 x 2.5 cm, with slight heterogeneous internal T2 hyperintensity, diffusion restriction, and demonstrating very gradual progressive peripheral nodular enhancement (series 19, image 49). An additional lesion in the posterior right lobe of the liver, hepatic segment VI, measures 0.8 cm and is too small to characterize although likely a subcentimeter cyst or hemangioma. Multiple small gallstones in the dependent gallbladder. Pancreas: There is a lobulated fluid signal, nonenhancing lesion of the pancreatic head immediately adjacent to the central common bile duct measuring 1.5 x 1.4 cm (series 22, image 25, series 4, image 22). No pancreatic ductal dilatation. Spleen:  Within normal limits in size and appearance. Adrenals/Urinary Tract: No masses identified. No evidence of hydronephrosis. Stomach/Bowel: Visualized portions within the abdomen are unremarkable. Vascular/Lymphatic: No pathologically enlarged lymph nodes identified. No abdominal aortic aneurysm demonstrated. Other:  None. Musculoskeletal: No suspicious bone lesions identified. IMPRESSION: 1. Examination is limited by extensive breath motion artifact throughout. 2. There is a subcapsular mass in the inferior tip of the right lobe of the liver, hepatic segment VI, measuring 3.3 x 2.5 cm, with very gradual progressive peripheral nodular enhancement. 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 3. An additional lesion in the posterior right lobe of the liver, hepatic segment VI, measures 0.8 cm and is too small to characterize although statistically likely a subcentimeter cyst or hemangioma. 4. There is a lobulated fluid signal, nonenhancing lesion of the pancreatic head  immediately adjacent to the central common bile duct measuring 1.5 cm. 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 No further follow-up is required. This recommendation follows ACR consensus guidelines: Management of Incidental Pancreatic Cysts: A White Paper of the ACR Incidental Findings Committee. [place] 1751;[id number]. Electronically Signed   By: Eddie Candle M.D.   On: 01/21/2020 13:57   MR ABDOMEN MRCP W WO CONTAST  Result Date: 01/21/2020 CLINICAL DATA:  Evaluate liver and pancreas lesions EXAM: MRI ABDOMEN WITHOUT AND WITH CONTRAST (INCLUDING MRCP) TECHNIQUE: Multiplanar multisequence MR imaging of the abdomen was performed both before and after the administration of intravenous contrast. Heavily T2-weighted images of the biliary and pancreatic ducts were obtained, and three-dimensional MRCP images were rendered by post processing. CONTRAST:  28mL GADAVIST GADOBUTROL 1 MMOL/ML IV SOLN COMPARISON:  Same day CT abdomen pelvis, 01/21/2020, 04/08/2016, 10/29/2007 FINDINGS: Examination is generally limited by extensive breath motion artifact throughout. Lower chest: Trace pleural effusions. Hepatobiliary: There is a subcapsular mass in the inferior tip of the right lobe of the liver, hepatic segment VI, measuring 3.3 x 2.5 cm, with slight heterogeneous internal T2 hyperintensity, diffusion restriction, and demonstrating very gradual progressive peripheral nodular enhancement (series 19, image 49). An additional lesion in the posterior right lobe of the liver, hepatic segment VI, measures 0.8 cm and is too small to characterize although likely a subcentimeter cyst or hemangioma.  Multiple small gallstones in the dependent gallbladder. Pancreas: There is a lobulated fluid signal, nonenhancing lesion of the pancreatic head immediately adjacent to the central common bile duct measuring 1.5 x 1.4 cm  (series 22, image 25, series 4, image 22). No pancreatic ductal dilatation. Spleen:  Within normal limits in size and appearance. Adrenals/Urinary Tract: No masses identified. No evidence of hydronephrosis. Stomach/Bowel: Visualized portions within the abdomen are unremarkable. Vascular/Lymphatic: No pathologically enlarged lymph nodes identified. No abdominal aortic aneurysm demonstrated. Other:  None. Musculoskeletal: No suspicious bone lesions identified. IMPRESSION: 1. Examination is limited by extensive breath motion artifact throughout. 2. There is a subcapsular mass in the inferior tip of the right lobe of the liver, hepatic segment VI, measuring 3.3 x 2.5 cm, with very gradual progressive peripheral nodular enhancement. 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 3. An additional lesion in the posterior right lobe of the liver, hepatic segment VI, measures 0.8 cm and is too small to characterize although statistically likely a subcentimeter cyst or hemangioma. 4. There is a lobulated fluid signal, nonenhancing lesion of the pancreatic head immediately adjacent to the central common bile duct measuring 1.5 cm. 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 No further follow-up is required. This recommendation follows ACR consensus guidelines: Management of Incidental Pancreatic Cysts: A White Paper of the ACR Incidental Findings Committee. [place] 3875;[id number]. Electronically Signed   By: Eddie Candle M.D.   On: 01/21/2020 13:57   CT RENAL STONE STUDY  Result Date: 01/21/2020 CLINICAL DATA:  Back pain, dysuria EXAM: CT ABDOMEN AND PELVIS WITHOUT CONTRAST TECHNIQUE: Multidetector CT imaging of the abdomen and pelvis was performed following the standard protocol without IV contrast. COMPARISON:   03/08/2016 FINDINGS: Lower chest: 5 mm noncalcified posterior right lower lobe pulmonary nodule (series 4, image 40), new from prior. Mild right greater than left bibasilar subsegmental atelectasis. Coronary artery calcifications. Hepatobiliary: Slightly ill-defined 3.0 cm lesion within the inferior aspect of the right hepatic lobe (series 2, image 38), new from prior. There is at least 1 additional subcentimeter indeterminate lesions within the liver (series 2, image 28), not definitively seen on prior. Cholelithiasis. Gallbladder is mildly distended. No biliary dilatation. Pancreas: Subtle area of relative low attenuation within the posterior aspect of the pancreatic head which appears new from prior (series 2, images 28-29). No peripancreatic stranding. No pancreatic ductal dilatation. No peripancreatic fluid. Spleen: Normal in size without focal abnormality. Adrenals/Urinary Tract: Adrenal glands are unremarkable. Kidneys are normal, without renal calculi, focal lesion, or hydronephrosis. Bladder is decompressed, limiting its evaluation. Stomach/Bowel: Stomach is within normal limits. Appendix not visualized, and may be surgically absent. Scattered colonic diverticulosis. No evidence of bowel wall thickening, distention, or inflammatory changes. Vascular/Lymphatic: Aortoiliac atherosclerotic calcification without aneurysm. Mild ectasia of the right common iliac artery, unchanged. No enlarged abdominal or pelvic lymph nodes. Reproductive: Prostatectomy. Other: No abdominal wall hernia or abnormality. No abdominopelvic ascites. Musculoskeletal: L3-5 PLIF. No acute osseous findings. The sclerotic osseous lesions seen on prior CT 03/08/2016 have resolved. IMPRESSION: 1. Subtle area of relative low attenuation within the posterior aspect of the pancreatic head which appears new from prior. Pancreatic mass is not excluded. When the patient is clinically stable and able to follow directions and hold their breath  (preferably as an outpatient) further evaluation with dedicated contrast-enhanced pancreatic  protocol MRI of the abdomen should be considered. 2. New slightly ill-defined 3.0 cm lesion within the inferior aspect of the right hepatic lobe with at least 1 additional subcentimeter indeterminate lesions within the liver. Findings are nonspecific but could reflect hepatic metastatic disease. These findings can also be further evaluated on the previously recommended MRI. 3. 5 mm noncalcified right lower lobe pulmonary nodule, new from prior. 4. The sclerotic osseous lesions seen on prior CT 03/08/2016 have resolved. 5. Cholelithiasis without evidence of acute cholecystitis. 6. Colonic diverticulosis without evidence of acute diverticulitis. 7. Aortic atherosclerosis. (ICD10-I70.0). Electronically Signed   By: Davina Poke D.O.   On: 01/21/2020 09:44    ASSESSMENT / PLAN:  Sepsis, gram-positive, likely strep -Source unclear , cholangitis and UTI will be unlikely sources -but await full culture data before narrowing antibiotics -If prolonged hypotension occurs can use peripheral Neo-Synephrine given tachycardia  AKI -improved  Liver mass/elevated LFTs -possibility of cholangiocarcinoma vs abscess is being considered Pancreatic lesion is chronic  -We will follow  Kara Mead MD. FCCP.  Pulmonary & Critical care  If no response to pager , please call [phone]     01/22/2020, 3:47 PM

## 2020-01-22 NOTE — Consult Note (Signed)
Referring Provider: Roxan Hockey, MD Primary Care Physician:  Celene Squibb, MD Primary Gastroenterologist:  Dr. Laural Golden  Reason for Consultation:   Fever, abnormal LFTs liver and pancreatic lesion on MRCP.  HPI:   Patient is 79 year old Caucasian male with history of metastatic prostate carcinoma maintained on Lupron and Xtiga with PSA level of 0 who has been having increasing difficulty with bilateral hip pain radiating to both legs.  Patient is being followed by Dr. Martyn Ehrich of [facility] ER of [facility].  He has had 2 injections to his back within the last few weeks without symptomatic relief. Patient experience rigors and fever night before last.  He noted lower abdominal pain which she describes to be mild.  He did not experience hematuria or dysuria.  He states he had dysuria few days ago when he took OTC medication and dysuria resolved.  Patient did not experience nausea vomiting cough or chest pain.  He recalls that he was short of breath when he was having rigors. Evaluation in emergency room revealed WBC of 15.4.  H&H was 12.4 and 38.0.  Chemistry panel was pertinent for BUN of 36 and creatinine of 1.96.  AST and ALT were elevated at 75 and 74 respectively.  Urine was cloudy with with rare bacteria but revealed budding yeast hyaline casts 0-5 RBCs and 11-20 WBCs. urine and blood cultures were obtained.  He also had on and has abdominal pelvic CT revealing cholelithiasis colonic diverticulosis without diverticulitis 3cm involving inferior aspect of right hepatic lobe and a much smaller liver lesion as well as cystic pancreatic lesion which was felt to be unchanged from prior studies. She was felt to be septic.  He was begun on cefepime, vancomycin and metronidazole and admitted to ICU. Patient denies nausea vomiting chest pain cough fever or chills.  He also denies abdominal pain.  He denies rectal bleeding or diarrhea.  He states his appetite has been good until the sickness.  He has  not lost any weight.  He states his bilateral hip and leg pain has gotten worse.  He denies numbness in his legs or feet.  He is using walker to ambulate.  He is married.  He is retired.  He has 2 grownup children.  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  He says he was still doing some work until his back and hip pain got worse.  He has never smoked cigarettes.  He drinks 2 drinks per day. Father died of ruptured AAA at age 79.  Mother had some type of cancer lived to be 57.  He has a brother age 86 who is also being treated for metastatic prostate carcinoma.  He had a half sister who died of bladder cancer at age 78.   Current Facility-Administered Medications:  .  0.9 %  sodium chloride infusion, , Intravenous, Continuous, Emokpae, Courage, MD, Last Rate: 150 mL/hr at 01/22/20 0152, Rate Verify at 01/22/20 0152 .  0.9 %  sodium chloride infusion, 250 mL, Intravenous, Continuous, Roxan Hockey, MD, Held at 01/21/20 2312 .  0.9 %  sodium chloride infusion, 250 mL, Intravenous, PRN, Emokpae, Courage, MD .  abiraterone acetate (ZYTIGA) tablet 1,000 mg, 1,000 mg, Oral, Daily, Emokpae, Courage, MD .  acetaminophen (TYLENOL) tablet 650 mg, 650 mg, Oral, Q6H PRN, 650 mg at 01/22/20 0604 **OR** acetaminophen (TYLENOL) suppository 650 mg, 650 mg, Rectal, Q6H PRN, Emokpae, Courage, MD .  albuterol (PROVENTIL) (2.5 MG/3ML) 0.083% nebulizer solution  2.5 mg, 2.5 mg, Nebulization, Q2H PRN, Emokpae, Courage, MD .  aspirin EC tablet 81 mg, 81 mg, Oral, Daily, Emokpae, Courage, MD .  ceFEPIme (MAXIPIME) 2 g in sodium chloride 0.9 % 100 mL IVPB, 2 g, Intravenous, Q12H, Emokpae, Courage, MD .  Chlorhexidine Gluconate Cloth 2 % PADS 6 each, 6 each, Topical, Daily, Reubin Milan, MD .  fluticasone [facility]) 50 MCG/ACT nasal spray 1 spray, 1 spray, Each Nare, Daily PRN, Emokpae, Courage, MD .  heparin injection 5,000 Units, 5,000 Units, Subcutaneous, Q8H, Emokpae,  Courage, MD, 5,000 Units at 01/22/20 0604 .  methocarbamol (ROBAXIN) tablet 750 mg, 750 mg, Oral, TID, Emokpae, Courage, MD, 750 mg at 01/21/20 2325 .  metroNIDAZOLE (FLAGYL) IVPB 500 mg, 500 mg, Intravenous, Q8H, Emokpae, Courage, MD, Last Rate: 100 mL/hr at 01/22/20 0606, 500 mg at 01/22/20 0606 .  multivitamin with minerals tablet 1 tablet, 1 tablet, Oral, Daily, Emokpae, Courage, MD, 1 tablet at 01/21/20 2324 .  nitroGLYCERIN (NITROSTAT) SL tablet 0.4 mg, 0.4 mg, Sublingual, Q5 Min x 3 PRN, Emokpae, Courage, MD .  norepinephrine (LEVOPHED) 4mg  in 273mL premix infusion, 2-10 mcg/min, Intravenous, Titrated, Roxan Hockey, MD, Held at 01/21/20 2311 .  ondansetron (ZOFRAN) tablet 4 mg, 4 mg, Oral, Q6H PRN **OR** ondansetron (ZOFRAN) injection 4 mg, 4 mg, Intravenous, Q6H PRN, Emokpae, Courage, MD .  polyethylene glycol (MIRALAX / GLYCOLAX) packet 17 g, 17 g, Oral, Daily PRN, Emokpae, Courage, MD .  rosuvastatin (CRESTOR) tablet 40 mg, 40 mg, Oral, QPC supper, Emokpae, Courage, MD .  sodium chloride flush (NS) 0.9 % injection 3 mL, 3 mL, Intravenous, Q12H, Emokpae, Courage, MD .  sodium chloride flush (NS) 0.9 % injection 3 mL, 3 mL, Intravenous, PRN, Emokpae, Courage, MD .  traZODone (DESYREL) tablet 50 mg, 50 mg, Oral, QHS PRN, Emokpae, Courage, MD .  vancomycin (VANCOCIN) IVPB 1000 mg/200 mL premix, 1,000 mg, Intravenous, Q24H, Emokpae, Courage, MD   Past Medical History:  Diagnosis Date  . Arthritis   . Collagen vascular disease ([place])   . Coronary atherosclerosis of native coronary artery    Multivessel status post CABG 2000  . Essential hypertension   . Hyperlipidemia   . Prostate cancer [facility])     Past Surgical History:  Procedure Laterality Date  . APPENDECTOMY     8 yrs ago- Dr Geroge Baseman  . CATARACT EXTRACTION W/PHACO Left 07/14/2014   Procedure: CATARACT EXTRACTION PHACO AND INTRAOCULAR LENS PLACEMENT (IOC);  Surgeon: Tonny Branch, MD;  Location: [facility] ORS;  Service: Ophthalmology;   Laterality: Left;  CDE:6.20  . CATARACT EXTRACTION W/PHACO Right 08/11/2014   Procedure: CATARACT EXTRACTION PHACO AND INTRAOCULAR LENS PLACEMENT RIGHT EYE;  Surgeon: Tonny Branch, MD;  Location: [facility] ORS;  Service: Ophthalmology;  Laterality: Right;  CDE:10.11  . COLONOSCOPY N/A 01/14/2015   Procedure: COLONOSCOPY;  Surgeon: Rogene Houston, MD;  Location: [facility] ENDO SUITE;  Service: Endoscopy;  Laterality: N/A;  730  . CORONARY ARTERY BYPASS GRAFT  01/1999   LIMA to LAD, SVG to diagonal, sequential SVG to PDA and PLB  . PROSTATECTOMY  2004   [name]  . TONSILLECTOMY     age 22    Prior to Admission medications   Medication Sig Start Date End Date Taking? Authorizing Provider  aspirin EC 81 MG tablet Take 1 tablet (81 mg total) by mouth daily. 12/06/18  Yes Satira Sark, MD  Calcium-Magnesium-Vitamin D (CALCIUM 1200+D3 PO) Take 1 tablet by mouth daily.    Yes [provider]  fluticasone (FLONASE) 50 MCG/ACT nasal spray Place 1 spray into both nostrils daily as needed for allergies or rhinitis.   Yes [provider]  methocarbamol (ROBAXIN) 750 MG tablet Take 750 mg by mouth 3 (three) times daily. 11/18/19  Yes [provider]  metoprolol succinate (TOPROL-XL) 25 MG 24 hr tablet TAKE (1) TABLET BY MOUTH DAILY OR AS OTHERWISE DIRECTED. 08/01/19  Yes Satira Sark, MD  Multiple Vitamin (MULTIVITAMIN WITH MINERALS) TABS Take 1 tablet by mouth daily.   Yes [provider]  naproxen sodium (ANAPROX) 220 MG tablet Take 220 mg by mouth daily as needed (pain).   Yes [provider]  predniSONE (DELTASONE) 5 MG tablet Take 1 tablet by mouth 2 (two) times daily. 09/20/16  Yes [provider]  ramipril (ALTACE) 5 MG capsule TAKE (1) CAPSULE BY MOUTH ONCE DAILY. 02/27/19  Yes Satira Sark, MD  rosuvastatin (CRESTOR) 40 MG tablet Take 1 tablet (40 mg total) by mouth daily after supper. 11/29/19 02/27/20 Yes Satira Sark, MD  Thiamine  HCl (VITAMIN B-1 PO) Take 1 tablet by mouth daily.   Yes [provider]  Vitamins/Minerals TABS Take by mouth.   Yes [provider]  ZYTIGA 250 MG tablet Take 4 tablets by mouth daily. 09/20/16  Yes [provider]  nitroGLYCERIN (NITROSTAT) 0.4 MG SL tablet Place 1 tablet (0.4 mg total) under the tongue every 5 (five) minutes x 3 doses as needed for chest pain (if no relief after 3rd dose, proceed to the ED for an evaluation or call 911). 12/06/18 03/06/19  Satira Sark, MD  predniSONE (DELTASONE) 20 MG tablet Take 20 mg by mouth 2 (two) times daily. Patient not taking: Reported on 01/21/2020 11/18/19   [provider]    Current Facility-Administered Medications  Medication Dose Route Frequency Provider Last Rate Last Admin  . 0.9 %  sodium chloride infusion   Intravenous Continuous Denton Brick, Courage, MD 150 mL/hr at 01/22/20 0152 Rate Verify at 01/22/20 0152  . 0.9 %  sodium chloride infusion  250 mL Intravenous Continuous Roxan Hockey, MD   Held at 01/21/20 2312  . 0.9 %  sodium chloride infusion  250 mL Intravenous PRN Emokpae, Courage, MD      . abiraterone acetate (ZYTIGA) tablet 1,000 mg  1,000 mg Oral Daily Emokpae, Courage, MD      . acetaminophen (TYLENOL) tablet 650 mg  650 mg Oral Q6H PRN Roxan Hockey, MD   650 mg at 01/22/20 0604   Or  . acetaminophen (TYLENOL) suppository 650 mg  650 mg Rectal Q6H PRN Emokpae, Courage, MD      . albuterol (PROVENTIL) (2.5 MG/3ML) 0.083% nebulizer solution 2.5 mg  2.5 mg Nebulization Q2H PRN Emokpae, Courage, MD      . aspirin EC tablet 81 mg  81 mg Oral Daily Emokpae, Courage, MD      . ceFEPIme (MAXIPIME) 2 g in sodium chloride 0.9 % 100 mL IVPB  2 g Intravenous Q12H Emokpae, Courage, MD      . Chlorhexidine Gluconate Cloth 2 % PADS 6 each  6 each Topical Daily Reubin Milan, MD      . fluticasone [facility]) 50 MCG/ACT nasal spray 1 spray  1 spray Each Nare Daily PRN Emokpae, Courage, MD      .  heparin injection 5,000 Units  5,000 Units Subcutaneous Q8H Roxan Hockey, MD   5,000 Units at 01/22/20 0604  . methocarbamol (ROBAXIN) tablet 750  mg  750 mg Oral TID Roxan Hockey, MD   750 mg at 01/21/20 2325  . metroNIDAZOLE (FLAGYL) IVPB 500 mg  500 mg Intravenous Q8H Emokpae, Courage, MD 100 mL/hr at 01/22/20 0606 500 mg at 01/22/20 0606  . multivitamin with minerals tablet 1 tablet  1 tablet Oral Daily Roxan Hockey, MD   1 tablet at 01/21/20 2324  . nitroGLYCERIN (NITROSTAT) SL tablet 0.4 mg  0.4 mg Sublingual Q5 Min x 3 PRN Emokpae, Courage, MD      . norepinephrine (LEVOPHED) 4mg  in 213mL premix infusion  2-10 mcg/min Intravenous Titrated Roxan Hockey, MD   Held at 01/21/20 2311  . ondansetron (ZOFRAN) tablet 4 mg  4 mg Oral Q6H PRN Emokpae, Courage, MD       Or  . ondansetron (ZOFRAN) injection 4 mg  4 mg Intravenous Q6H PRN Emokpae, Courage, MD      . polyethylene glycol (MIRALAX / GLYCOLAX) packet 17 g  17 g Oral Daily PRN Emokpae, Courage, MD      . rosuvastatin (CRESTOR) tablet 40 mg  40 mg Oral QPC supper Emokpae, Courage, MD      . sodium chloride flush (NS) 0.9 % injection 3 mL  3 mL Intravenous Q12H Emokpae, Courage, MD      . sodium chloride flush (NS) 0.9 % injection 3 mL  3 mL Intravenous PRN Emokpae, Courage, MD      . traZODone (DESYREL) tablet 50 mg  50 mg Oral QHS PRN Emokpae, Courage, MD      . vancomycin (VANCOCIN) IVPB 1000 mg/200 mL premix  1,000 mg Intravenous Q24H Roxan Hockey, MD        Allergies as of 01/21/2020 - Review Complete 01/21/2020  Allergen Reaction Noted  . Latex Rash 01/02/2015  . Sulfa antibiotics Rash 10/30/2012    Family History  Problem Relation Age of Onset  . Heart attack Mother   . Cancer Mother   . Aneurysm Father     Social History   Socioeconomic History  . Marital status: Married    Spouse name: Not on file  . Number of children: Not on file  . Years of education: Not on file  . Highest education level: Not  on file  Occupational History  . Not on file  Tobacco Use  . Smoking status: Never Smoker  . Smokeless tobacco: Never Used  Vaping Use  . Vaping Use: Never used  Substance and Sexual Activity  . Alcohol use: Yes    Alcohol/week: 0.0 standard drinks    Comment: daily-glass of wine  . Drug use: No  . Sexual activity: Not on file  Other Topics Concern  . Not on file  Social History Narrative  . Not on file   Social Determinants of Health   Financial Resource Strain:   . Difficulty of Paying Living Expenses:   Food Insecurity:   . Worried About Charity fundraiser in the Last Year:   . Arboriculturist in the Last Year:   Transportation Needs:   . Film/video editor (Medical):   Marland Kitchen Lack of Transportation (Non-Medical):   Physical Activity:   . Days of Exercise per Week:   . Minutes of Exercise per Session:   Stress:   . Feeling of Stress :   Social Connections:   . Frequency of Communication with Friends and Family:   . Frequency of Social Gatherings with Friends and Family:   . Attends Religious Services:   . Active  Member of Clubs or Organizations:   . Attends Archivist Meetings:   Marland Kitchen Marital Status:   Intimate Partner Violence:   . Fear of Current or Ex-Partner:   . Emotionally Abused:   Marland Kitchen Physically Abused:   . Sexually Abused:     Review of Systems: See HPI, otherwise normal ROS  Physical Exam: Temp:  [98 F (36.7 C)-102.1 F (38.9 C)] 99.2 F (37.3 C) (06/23 0729) Pulse Rate:  [35-132] 132 (06/23 0729) Resp:  [13-32] 27 (06/23 0729) BP: (78-157)/(45-107) 157/107 (06/23 0400) SpO2:  [77 %-100 %] 94 % (06/23 0729) Weight:  [73.2 kg] 73.2 kg (06/23 0500)   Patient is alert and in no acute distress. Conjunctivae is pink.  Sclerae nonicteric Oropharyngeal mucosa is normal. Neck without masses or thyromegaly. Midsternal scar. Cardiac exam with regular rhythm normal S1 and S2.  No murmur gallop noted. Auscultation lungs reveal vesicular  breath sounds bilaterally. Abdomen is full.  He has small ecchymosis in the right lower quadrant.  Bowel sounds are normal.  On palpation abdomen is soft and nontender with organomegaly or masses. He does not have peripheral edema or clubbing.  Lab Results: Recent Labs    01/21/20 0838 01/22/20 0430  WBC 15.4* 10.1  HGB 12.4* 11.5*  HCT 38.0* 36.3*  PLT 180 127*   BMET Recent Labs    01/21/20 0838 01/21/20 2057 01/22/20 0430  NA 132*  --  137  K 3.9  --  4.2  CL 95*  --  103  CO2 25  --  21*  GLUCOSE 117*  --  90  BUN 36*  --  30*  CREATININE 1.96* 1.50* 1.38*  CALCIUM 9.1  --  8.6*   LFT Recent Labs    01/22/20 0430  PROT 5.7*  ALBUMIN 2.3*  AST 153*  ALT 141*  ALKPHOS 75  BILITOT 0.8   PT/INR Recent Labs    01/22/20 0430  LABPROT 14.6  INR 1.2   Hepatitis Panel No results for input(s): HEPBSAG, HCVAB, HEPAIGM, HEPBIGM in the last 72 hours.  Studies/Results: MR 3D Recon At Scanner  Result Date: 01/21/2020 CLINICAL DATA:  Evaluate liver and pancreas lesions EXAM: MRI ABDOMEN WITHOUT AND WITH CONTRAST (INCLUDING MRCP) TECHNIQUE: Multiplanar multisequence MR imaging of the abdomen was performed both before and after the administration of intravenous contrast. Heavily T2-weighted images of the biliary and pancreatic ducts were obtained, and three-dimensional MRCP images were rendered by post processing. CONTRAST:  15mL GADAVIST GADOBUTROL 1 MMOL/ML IV SOLN COMPARISON:  Same day CT abdomen pelvis, 01/21/2020, 04/08/2016, 10/29/2007 FINDINGS: Examination is generally limited by extensive breath motion artifact throughout. Lower chest: Trace pleural effusions. Hepatobiliary: There is a subcapsular mass in the inferior tip of the right lobe of the liver, hepatic segment VI, measuring 3.3 x 2.5 cm, with slight heterogeneous internal T2 hyperintensity, diffusion restriction, and demonstrating very gradual progressive peripheral nodular enhancement (series 19, image 49). An  additional lesion in the posterior right lobe of the liver, hepatic segment VI, measures 0.8 cm and is too small to characterize although likely a subcentimeter cyst or hemangioma. Multiple small gallstones in the dependent gallbladder. Pancreas: There is a lobulated fluid signal, nonenhancing lesion of the pancreatic head immediately adjacent to the central common bile duct measuring 1.5 x 1.4 cm (series 22, image 25, series 4, image 22). No pancreatic ductal dilatation. Spleen:  Within normal limits in size and appearance. Adrenals/Urinary Tract: No masses identified. No evidence of hydronephrosis. Stomach/Bowel: Visualized portions within  the abdomen are unremarkable. Vascular/Lymphatic: No pathologically enlarged lymph nodes identified. No abdominal aortic aneurysm demonstrated. Other:  None. Musculoskeletal: No suspicious bone lesions identified. IMPRESSION: 1. Examination is limited by extensive breath motion artifact throughout. 2. There is a subcapsular mass in the inferior tip of the right lobe of the liver, hepatic segment VI, measuring 3.3 x 2.5 cm, with very gradual progressive peripheral nodular enhancement. 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 3. An additional lesion in the posterior right lobe of the liver, hepatic segment VI, measures 0.8 cm and is too small to characterize although statistically likely a subcentimeter cyst or hemangioma. 4. There is a lobulated fluid signal, nonenhancing lesion of the pancreatic head immediately adjacent to the central common bile duct measuring 1.5 cm. 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 No further follow-up is required. This recommendation follows ACR consensus guidelines: Management of Incidental Pancreatic Cysts: A White Paper of the ACR  Incidental Findings Committee. [place] [id number]. Electronically Signed   By: Eddie Candle M.D.   On: 01/21/2020 13:57   MR ABDOMEN MRCP W WO CONTAST  Result Date: 01/21/2020 CLINICAL DATA:  Evaluate liver and pancreas lesions EXAM: MRI ABDOMEN WITHOUT AND WITH CONTRAST (INCLUDING MRCP) TECHNIQUE: Multiplanar multisequence MR imaging of the abdomen was performed both before and after the administration of intravenous contrast. Heavily T2-weighted images of the biliary and pancreatic ducts were obtained, and three-dimensional MRCP images were rendered by post processing. CONTRAST:  32mL GADAVIST GADOBUTROL 1 MMOL/ML IV SOLN COMPARISON:  Same day CT abdomen pelvis, 01/21/2020, 04/08/2016, 10/29/2007 FINDINGS: Examination is generally limited by extensive breath motion artifact throughout. Lower chest: Trace pleural effusions. Hepatobiliary: There is a subcapsular mass in the inferior tip of the right lobe of the liver, hepatic segment VI, measuring 3.3 x 2.5 cm, with slight heterogeneous internal T2 hyperintensity, diffusion restriction, and demonstrating very gradual progressive peripheral nodular enhancement (series 19, image 49). An additional lesion in the posterior right lobe of the liver, hepatic segment VI, measures 0.8 cm and is too small to characterize although likely a subcentimeter cyst or hemangioma. Multiple small gallstones in the dependent gallbladder. Pancreas: There is a lobulated fluid signal, nonenhancing lesion of the pancreatic head immediately adjacent to the central common bile duct measuring 1.5 x 1.4 cm (series 22, image 25, series 4, image 22). No pancreatic ductal dilatation. Spleen:  Within normal limits in size and appearance. Adrenals/Urinary Tract: No masses identified. No evidence of hydronephrosis. Stomach/Bowel: Visualized portions within the abdomen are unremarkable. Vascular/Lymphatic: No pathologically enlarged lymph nodes identified. No abdominal aortic  aneurysm demonstrated. Other:  None. Musculoskeletal: No suspicious bone lesions identified. IMPRESSION: 1. Examination is limited by extensive breath motion artifact throughout. 2. There is a subcapsular mass in the inferior tip of the right lobe of the liver, hepatic segment VI, measuring 3.3 x 2.5 cm, with very gradual progressive peripheral nodular enhancement. 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 3. An additional lesion in the posterior right lobe of the liver, hepatic segment VI, measures 0.8 cm and is too small to characterize although statistically likely a subcentimeter cyst or hemangioma. 4. There is a lobulated fluid signal, nonenhancing lesion of the pancreatic head immediately adjacent to the central common bile  duct measuring 1.5 cm. 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 No further follow-up is required. This recommendation follows ACR consensus guidelines: Management of Incidental Pancreatic Cysts: A White Paper of the ACR Incidental Findings Committee. [place] [id number]. Electronically Signed   By: Eddie Candle M.D.   On: 01/21/2020 13:57   CT RENAL STONE STUDY  Result Date: 01/21/2020 CLINICAL DATA:  Back pain, dysuria EXAM: CT ABDOMEN AND PELVIS WITHOUT CONTRAST TECHNIQUE: Multidetector CT imaging of the abdomen and pelvis was performed following the standard protocol without IV contrast. COMPARISON:  03/08/2016 FINDINGS: Lower chest: 5 mm noncalcified posterior right lower lobe pulmonary nodule (series 4, image 40), new from prior. Mild right greater than left bibasilar subsegmental atelectasis. Coronary artery calcifications. Hepatobiliary: Slightly ill-defined 3.0 cm lesion within the inferior aspect of the right hepatic lobe (series 2, image 38), new from  prior. There is at least 1 additional subcentimeter indeterminate lesions within the liver (series 2, image 28), not definitively seen on prior. Cholelithiasis. Gallbladder is mildly distended. No biliary dilatation. Pancreas: Subtle area of relative low attenuation within the posterior aspect of the pancreatic head which appears new from prior (series 2, images 28-29). No peripancreatic stranding. No pancreatic ductal dilatation. No peripancreatic fluid. Spleen: Normal in size without focal abnormality. Adrenals/Urinary Tract: Adrenal glands are unremarkable. Kidneys are normal, without renal calculi, focal lesion, or hydronephrosis. Bladder is decompressed, limiting its evaluation. Stomach/Bowel: Stomach is within normal limits. Appendix not visualized, and may be surgically absent. Scattered colonic diverticulosis. No evidence of bowel wall thickening, distention, or inflammatory changes. Vascular/Lymphatic: Aortoiliac atherosclerotic calcification without aneurysm. Mild ectasia of the right common iliac artery, unchanged. No enlarged abdominal or pelvic lymph nodes. Reproductive: Prostatectomy. Other: No abdominal wall hernia or abnormality. No abdominopelvic ascites. Musculoskeletal: L3-5 PLIF. No acute osseous findings. The sclerotic osseous lesions seen on prior CT 03/08/2016 have resolved. IMPRESSION: 1. Subtle area of relative low attenuation within the posterior aspect of the pancreatic head which appears new from prior. Pancreatic mass is not excluded. When the patient is clinically stable and able to follow directions and hold their breath (preferably as an outpatient) further evaluation with dedicated contrast-enhanced pancreatic protocol MRI of the abdomen should be considered. 2. New slightly ill-defined 3.0 cm lesion within the inferior aspect of the right hepatic lobe with at least 1 additional subcentimeter indeterminate lesions within the liver. Findings are nonspecific but could reflect hepatic  metastatic disease. These findings can also be further evaluated on the previously recommended MRI. 3. 5 mm noncalcified right lower lobe pulmonary nodule, new from prior. 4. The sclerotic osseous lesions seen on prior CT 03/08/2016 have resolved. 5. Cholelithiasis without evidence of acute cholecystitis. 6. Colonic diverticulosis without evidence of acute diverticulitis. 7. Aortic atherosclerosis. (ICD10-I70.0). Electronically Signed   By: Davina Poke D.O.   On: 01/21/2020 09:44   CT and MR reviewed alongside Dr. Thornton Papas and compared with prior studies.  Assessment;  Patient is 79 year old Caucasian male with history of metastatic prostate carcinoma stable on therapy with progressive bilateral hip and leg pain who presents with fever and chills.  He has elevated transaminases.  Imaging studies revealed cholelithiasis but he does not have thickened gallbladder wall with dilated biliary system or evidence of choledocholithiasis.  He does have 35 x 25 mm hepatic lesion in inferior aspect of right lobe concerning for neoplasm but could be an evolving abscess.  He also has a pancreatic lesion which has not increased in size since last study. Urinalysis reveals budding yeast. Patient's problems can be summarized as follows.  #1.  Fever.  Presentation is concerning for sepsis.  Urine and blood cultures are pending.  I do not believe he has cholangitis.  Urinalysis is concerning for fungal infection.  He needs to be covered with an antifungal until cultures back.  #2.  Liver lesions.  He has 2 liver lesions.  Larger liver lesion on inferior aspect of hepatic lobe concerning for neoplasm but could be evolving abscess.  Follow-up study would be needed with biopsy if there is no change.  Smaller lesion appears to be cystic.  #3.  Pancreatic lesion appears to be chronic and without significant change.  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   Recommendations;  Clear liquids. Consider adding antifungal to antibiotic  regimen until culture results available. Repeat LFTs in a.m.  Discussed with Dr. Irwin Brakeman.   LOS: 1 day   Vic Esco  01/22/2020, 8:16 AM

## 2020-01-22 NOTE — Consult Note (Signed)
Consultation Note Date: 01/22/2020   Patient Name: Samuel Gregory  DOB: [birth year]  MRN: [id number]  Age / Sex: 79 y.o., male  PCP: Celene Squibb, MD Referring Physician: Murlean Iba, MD  Reason for Consultation: Disposition and Psychosocial/spiritual support  HPI/Patient Profile: 79 y.o. male  with past medical history of prostate cancer with metastatic burden to bone being followed by [facility], managed with Lupron and Renold Genta, collagen vascular disease with daily use of prednisone, CAD/multivessel status post CABG in 2000, HTN/HLD arthritis, appendectomy 2013, bilateral cataract surgery 2016, prostatectomy 2004, admitted on 01/21/2020 with sepsis due to intra-abdominal infection.   Clinical Assessment and Goals of Care: I have reviewed medical records including EPIC notes, labs and imaging, received report from bedside nursing staff, examined the patient and met at bedside with daughter, [name] who is an OR Equities trader, to discuss diagnosis prognosis, GOC, EOL wishes, disposition and options.  I introduced Palliative Medicine as specialized medical care for people living with serious illness. It focuses on providing relief from the symptoms and stress of a serious illness.   We discussed a brief life review of the patient.  Mr. Lacroix is married to Panayiotis Rain[place], he has 2 children a boy Gerald Stabs and girl [place].  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  As far as functional and nutritional status, Mr. Meeker had been working 20 to 24 hours a week until approximately 2-1/2 months ago.  He states his mobility has decreased to the point where he needs a Gregory for safety.  We discussed current illness and what it means in the larger context of on-going co-morbidities.  We talk in detail about the treatment plan including, but not limited to, selected labs, images,  antibiotics, antifungals, physician consults, CODE STATUS.  I attempted to elicit values and goals of care important to the patient, and both Mr. Matherne and his daughter talked about his enjoyment of being active..    Advanced directives, concepts specific to code status, were considered and discussed.  We talked about "treat the treatable, but allowing natural death".  Mr. Errico tells me that he does not think anyone has asked him this, and he has not considered his choices.  We talked about the realities of CPR and intubation/"life support".  I asked him to consider that if we are doing all these things for him, and still he worsens, would he want to have life support.  I share with him that if he does indeed want life support, our question is for how long, 2 weeks, 2 months, forever?  I encouraged him to think about his choices and we will talk more tomorrow.  Questions and concerns were addressed.  The family was encouraged to call with questions or concerns.   Conference with attending, bedside nursing staff related to patient condition, needs, goals of care, CODE STATUS discussions.  HCPOA   NEXT OF KIN -Mr. Dowland has a wife, Cyler Kappes, that he names as his surrogate decision maker.  He also has a son Gerald Stabs, and a daughter [name], who would assist in decision-making.   SUMMARY OF RECOMMENDATIONS   At this point continue to treat the treatable Continue CODE STATUS discussions Time for outcomes   Code Status/Advance Care Planning:  Full code -we talked about "treat the treatable, but allowing natural passing".  We talked about the realities of CPR and intubation.  I asked Mr. Valdez to consider these choices, and if he chooses life support, for how long would he accept this care.  I encouraged him to consider what this time looks like and feels like for him.  Symptom Management:   Per hospitalist, no additional needs at this time.  Palliative Prophylaxis:   Frequent Pain Assessment,  Oral Care and Turn Reposition  Additional Recommendations (Limitations, Scope, Preferences):  Full Scope Treatment  Psycho-social/Spiritual:   Desire for further Chaplaincy support:no  Additional Recommendations: Caregiving  Support/Resources  Prognosis:   Unable to determine, based on outcomes.  1 year or more would not be surprising based on prior functional status, somewhat limited chronic illness burden, prostate cancer with metastatic burden but in remission.  Discharge Planning: To be determined, short-term rehab versus home with home health      Primary Diagnoses: Present on Admission: . Hypotension . Abdominal pain-Pancreatic and liver masses . Essential hypertension . Prostate cancer --with bony metastases, status post prior prostatectomy in 2004 . Coronary atherosclerosis of native coronary artery . Severe sepsis with septic shock ([place])   I have reviewed the medical record, interviewed the patient and family, and examined the patient. The following aspects are pertinent.  Past Medical History:  Diagnosis Date  . Arthritis   . Collagen vascular disease ([place])   . Coronary atherosclerosis of native coronary artery    Multivessel status post CABG 2000  . Essential hypertension   . Hyperlipidemia   . Prostate cancer [facility])    Social History   Socioeconomic History  . Marital status: Married    Spouse name: Not on file  . Number of children: Not on file  . Years of education: Not on file  . Highest education level: Not on file  Occupational History  . Not on file  Tobacco Use  . Smoking status: Never Smoker  . Smokeless tobacco: Never Used  Vaping Use  . Vaping Use: Never used  Substance and Sexual Activity  . Alcohol use: Yes    Alcohol/week: 0.0 standard drinks    Comment: daily-glass of wine  . Drug use: No  . Sexual activity: Not on file  Other Topics Concern  . Not on file  Social History Narrative  . Not on file   Social Determinants of  Health   Financial Resource Strain:   . Difficulty of Paying Living Expenses:   Food Insecurity:   . Worried About Charity fundraiser in the Last Year:   . Arboriculturist in the Last Year:   Transportation Needs:   . Film/video editor (Medical):   Marland Kitchen Lack of Transportation (Non-Medical):   Physical Activity:   . Days of Exercise per Week:   . Minutes of Exercise per Session:   Stress:   . Feeling of Stress :   Social Connections:   . Frequency of Communication with Friends and Family:   . Frequency of Social Gatherings with Friends and Family:   . Attends Religious Services:   . Active Member of Clubs or Organizations:   . Attends Archivist Meetings:   .  Marital Status:    Family History  Problem Relation Age of Onset  . Heart attack Mother   . Cancer Mother   . Aneurysm Father    Scheduled Meds: . abiraterone acetate  1,000 mg Oral Daily  . aspirin EC  81 mg Oral Daily  . Chlorhexidine Gluconate Cloth  6 each Topical Daily  . heparin injection (subcutaneous)  5,000 Units Subcutaneous Q8H  . methocarbamol  750 mg Oral TID  . multivitamin with minerals  1 tablet Oral Daily  . rosuvastatin  40 mg Oral QPC supper  . sodium chloride flush  3 mL Intravenous Q12H   Continuous Infusions: . sodium chloride 150 mL/hr at 01/22/20 0152  . sodium chloride Stopped (01/21/20 2312)  . sodium chloride    . ceFEPime (MAXIPIME) IV 2 g (01/22/20 0843)  . fluconazole (DIFLUCAN) IV 100 mg (01/22/20 0940)  . metronidazole 500 mg (01/22/20 0606)  . norepinephrine (LEVOPHED) Adult infusion Stopped (01/21/20 2311)  . vancomycin     PRN Meds:.sodium chloride, acetaminophen **OR** acetaminophen, albuterol, fluticasone, nitroGLYCERIN, ondansetron **OR** ondansetron (ZOFRAN) IV, polyethylene glycol, sodium chloride flush, traZODone Medications Prior to Admission:  Prior to Admission medications   Medication Sig Start Date End Date Taking? Authorizing Provider  aspirin EC 81  MG tablet Take 1 tablet (81 mg total) by mouth daily. 12/06/18  Yes Satira Sark, MD  Calcium-Magnesium-Vitamin D (CALCIUM 1200+D3 PO) Take 1 tablet by mouth daily.    Yes [provider]  fluticasone (FLONASE) 50 MCG/ACT nasal spray Place 1 spray into both nostrils daily as needed for allergies or rhinitis.   Yes [provider]  methocarbamol (ROBAXIN) 750 MG tablet Take 750 mg by mouth 3 (three) times daily. 11/18/19  Yes [provider]  metoprolol succinate (TOPROL-XL) 25 MG 24 hr tablet TAKE (1) TABLET BY MOUTH DAILY OR AS OTHERWISE DIRECTED. 08/01/19  Yes Satira Sark, MD  Multiple Vitamin (MULTIVITAMIN WITH MINERALS) TABS Take 1 tablet by mouth daily.   Yes [provider]  naproxen sodium (ANAPROX) 220 MG tablet Take 220 mg by mouth daily as needed (pain).   Yes [provider]  predniSONE (DELTASONE) 5 MG tablet Take 1 tablet by mouth 2 (two) times daily. 09/20/16  Yes [provider]  ramipril (ALTACE) 5 MG capsule TAKE (1) CAPSULE BY MOUTH ONCE DAILY. 02/27/19  Yes Satira Sark, MD  rosuvastatin (CRESTOR) 40 MG tablet Take 1 tablet (40 mg total) by mouth daily after supper. 11/29/19 02/27/20 Yes Satira Sark, MD  Thiamine HCl (VITAMIN B-1 PO) Take 1 tablet by mouth daily.   Yes [provider]  Vitamins/Minerals TABS Take by mouth.   Yes [provider]  ZYTIGA 250 MG tablet Take 4 tablets by mouth daily. 09/20/16  Yes [provider]  nitroGLYCERIN (NITROSTAT) 0.4 MG SL tablet Place 1 tablet (0.4 mg total) under the tongue every 5 (five) minutes x 3 doses as needed for chest pain (if no relief after 3rd dose, proceed to the ED for an evaluation or call 911). 12/06/18 03/06/19  Satira Sark, MD  predniSONE (DELTASONE) 20 MG tablet Take 20 mg by mouth 2 (two) times daily. Patient not taking: Reported on 01/21/2020 11/18/19   [provider]   Allergies  Allergen Reactions  . Latex  Rash  . Sulfa Antibiotics Rash   Review of Systems  Unable to perform ROS: Other    Physical Exam Vitals and nursing note reviewed.  Constitutional:  General: He is not in acute distress.    Appearance: He is not ill-appearing.  HENT:     Head: Normocephalic and atraumatic.     Mouth/Throat:     Mouth: Mucous membranes are moist.  Cardiovascular:     Rate and Rhythm: Tachycardia present.  Pulmonary:     Effort: Pulmonary effort is normal. No respiratory distress.  Abdominal:     General: Abdomen is flat.     Palpations: Abdomen is soft.  Musculoskeletal:        General: No swelling.  Skin:    General: Skin is warm and dry.  Neurological:     Mental Status: He is alert and oriented to person, place, and time.  Psychiatric:        Mood and Affect: Mood normal.        Behavior: Behavior normal.     Vital Signs: BP 119/77   Pulse (!) 142   Temp 98.8 F (37.1 C) (Oral)   Resp (!) 26   Ht 5' 6" (1.676 m)   Wt 73.2 kg   SpO2 97%   BMI 26.05 kg/m  Pain Scale: 0-10   Pain Score: 0-No pain   SpO2: SpO2: 97 % O2 Device:SpO2: 97 % O2 Flow Rate: .   IO: Intake/output summary:   Intake/Output Summary (Last 24 hours) at 01/22/2020 1231 Last data filed at 01/22/2020 0947 Gross per 24 hour  Intake 1600 ml  Output 750 ml  Net 850 ml    LBM:   Baseline Weight: Weight: 74.8 kg Most recent weight: Weight: 73.2 kg     Palliative Assessment/Data:   Flowsheet Rows     Most Recent Value  Intake Tab  Referral Department Hospitalist  Unit at Time of Referral Intermediate Care Unit  Palliative Care Primary Diagnosis Sepsis/Infectious Disease  Date Notified 01/22/20  Palliative Care Type New Palliative care  Reason for referral Clarify Goals of Care  Date of Admission 01/21/20  Date first seen by Palliative Care 01/22/20  # of days Palliative referral response time 0 Day(s)  # of days IP prior to Palliative referral 1  Clinical Assessment  Palliative  Performance Scale Score 40%  Pain Max last 24 hours Not able to report  Pain Min Last 24 hours Not able to report  Dyspnea Max Last 24 Hours Not able to report  Dyspnea Min Last 24 hours Not able to report  Psychosocial & Spiritual Assessment  Palliative Care Outcomes      Time In: 1100 Time Out: 1210 Time Total: 70 minutes  Greater than 50%  of this time was spent counseling and coordinating care related to the above assessment and plan.  Signed by:  A , NP   Please contact Palliative Medicine Team phone at [phone] for questions and concerns.  For individual provider: See Amion 

## 2020-01-22 NOTE — Progress Notes (Signed)
CRITICAL VALUE ALERT  Critical Value:  Anaerobic Blood Cultures: Gram + Cocci  Date & Time Notied:  01/22/20  @ 1740  Provider Notified: Dr. Wynetta Emery  Orders Received/Actions taken: Patient on antibiotics. MD rounding.

## 2020-01-22 NOTE — Progress Notes (Signed)
PROGRESS NOTE   Samuel Gregory  XKG:[id number] DOB: 02-25-1941 DOA: 01/21/2020 PCP: Celene Squibb, MD   Chief Complaint  Patient presents with  . Back Pain   Brief Admission History:  79 year old gentleman with collagen vascular disease, CAD, state cancer that is metastatic, hypertension presented to the emergency department with severe back pain and admitted with severe sepsis.  Assessment & Plan:   Principal Problem:   Severe sepsis with septic shock [facility]) Active Problems:   Essential hypertension   Coronary atherosclerosis of native coronary artery   Prostate cancer --with bony metastases, status post prior prostatectomy in 2004   Hypotension   Abdominal pain-Pancreatic and liver masses  1. Severe sepsis from unknown source-continue broad-spectrum antibiotics and follow cultures he has been weaned off the Levophed infusion.  Lactic acidosis resolved.  Continue supportive care. 2. Elevated LFTs-appreciate gastroenterology consultation and will follow recommendations.  Follow LFTs. 3. AKI-prerenal-improving with IV fluid hydration. 4. Leukocytosis-WBC trending down to normal with treatments. 5. Hypotension-markedly improved and has been weaned off the Levophed infusion.  Holding home blood pressure medications temporarily until blood pressures improved. 6. Metastatic prostate cancer with bone metastases-monitor closely and I have requested a palliative medicine consultation for assistance with goals of care.  His mortality risk remains very high.  DVT prophylaxis: Heparin/SCDs Code Status: Full Family Communication: Spouse and daughter at bedside Disposition:   Status is: Inpatient  Remains inpatient appropriate because:Hemodynamically unstable, Persistent severe electrolyte disturbances and IV treatments appropriate due to intensity of illness or inability to take PO   Dispo: The patient is from: Home              Anticipated d/c is to: TBD              Anticipated d/c date  is: 3 days              Patient currently is not medically stable to d/c.  Consultants:   GI  Palliative   Procedures:   Central line placement  Antimicrobials:  Anti-infectives (From admission, onward)   Start     Dose/Rate Route Frequency Ordered Stop   01/22/20 2200  vancomycin (VANCOCIN) IVPB 1000 mg/200 mL premix     Discontinue     1,000 mg 200 mL/hr over 60 Minutes Intravenous Every 24 hours 01/21/20 2203     01/22/20 0930  ceFEPIme (MAXIPIME) 2 g in sodium chloride 0.9 % 100 mL IVPB     Discontinue     2 g 200 mL/hr over 30 Minutes Intravenous Every 12 hours 01/21/20 2202     01/22/20 0900  fluconazole (DIFLUCAN) IVPB 100 mg     Discontinue     100 mg 50 mL/hr over 60 Minutes Intravenous Every 24 hours 01/22/20 0829 01/25/20 0859   01/21/20 2200  metroNIDAZOLE (FLAGYL) IVPB 500 mg     Discontinue     500 mg 100 mL/hr over 60 Minutes Intravenous Every 8 hours 01/21/20 2113     01/21/20 2115  vancomycin (VANCOREADY) IVPB 1500 mg/300 mL        1,500 mg 150 mL/hr over 120 Minutes Intravenous  Once 01/21/20 2101 01/22/20 0315   01/21/20 2115  ceFEPIme (MAXIPIME) 2 g in sodium chloride 0.9 % 100 mL IVPB  Status:  Discontinued        2 g 200 mL/hr over 30 Minutes Intravenous Every 24 hours 01/21/20 2101 01/21/20 2202   01/21/20 1715  cefTRIAXone (ROCEPHIN) 2 g in sodium chloride 0.9 %  100 mL IVPB  Status:  Discontinued        2 g 200 mL/hr over 30 Minutes Intravenous Every 24 hours 01/21/20 1702 01/21/20 2026      Subjective: Patient reports that he feels less unwell than yesterday.  He is still having back pain.  He had fever this morning.  Objective: Vitals:   01/22/20 0729 01/22/20 0900 01/22/20 1000 01/22/20 1117  BP:  (!) 90/56 114/61   Pulse: (!) 132 (!) 120 (!) 148 (!) 145  Resp: (!) 27 (!) 27 (!) 29 18  Temp: 99.2 F (37.3 C)   98.8 F (37.1 C)  TempSrc: Oral   Oral  SpO2: 94% 94% 94% 95%  Weight:      Height:        Intake/Output Summary (Last 24  hours) at 01/22/2020 1125 Last data filed at 01/22/2020 0947 Gross per 24 hour  Intake 1600 ml  Output 750 ml  Net 850 ml   Filed Weights   01/21/20 0718 01/22/20 0342 01/22/20 0500  Weight: 74.8 kg 73.2 kg 73.2 kg    Examination:  General exam: She appears chronically ill and acutely ill.  No apparent distress.  Speaking in full sentences. Respiratory system:  Respiratory effort normal. Cardiovascular system: S1 & S2 heard, tachycardic rate. No JVD, murmurs, rubs, gallops or clicks. No pedal edema. Gastrointestinal system: Abdomen is nondistended, soft and nontender. No organomegaly or masses felt. Normal bowel sounds heard. Central nervous system: Alert and oriented. No focal neurological deficits. Extremities: Symmetric 5 x 5 power. Skin: No rashes, lesions or ulcers Psychiatry: Judgement and insight appear normal. Mood & affect appropriate.   Data Reviewed: I have personally reviewed following labs and imaging studies  CBC: Recent Labs  Lab 01/21/20 0838 01/22/20 0430  WBC 15.4* 10.1  HGB 12.4* 11.5*  HCT 38.0* 36.3*  MCV 96.2 97.1  PLT 180 127*    Basic Metabolic Panel: Recent Labs  Lab 01/21/20 0838 01/21/20 2057 01/22/20 0430  NA 132*  --  137  K 3.9  --  4.2  CL 95*  --  103  CO2 25  --  21*  GLUCOSE 117*  --  90  BUN 36*  --  30*  CREATININE 1.96* 1.50* 1.38*  CALCIUM 9.1  --  8.6*    GFR: Estimated Creatinine Clearance: 39.8 mL/min (A) (by C-G formula based on SCr of 1.38 mg/dL (H)).  Liver Function Tests: Recent Labs  Lab 01/21/20 0838 01/22/20 0430  AST 75* 153*  ALT 74* 141*  ALKPHOS 77 75  BILITOT 0.9 0.8  PROT 6.2* 5.7*  ALBUMIN 2.7* 2.3*    CBG: No results for input(s): GLUCAP in the last 168 hours.  Recent Results (from the past 240 hour(s))  Culture, blood (single) w Reflex to ID Panel     Status: None (Preliminary result)   Collection Time: 01/21/20  9:04 AM   Specimen: BLOOD  Result Value Ref Range Status   Specimen  Description BLOOD RIGHT ANTECUBITAL  Final   Special Requests   Final    BOTTLES DRAWN AEROBIC AND ANAEROBIC Blood Culture adequate volume   Culture  Setup Time   Final    GRAM POSITIVE COCCI ANAEROBIC BOTTLE ONLY Gram Stain Report Called to,Read Back By and Verified With: SHELTON @ [id number] ON [id number] BY HENDERSON L.    Culture   Final    NO GROWTH < 24 HOURS Performed at [facility], [address] [place], [place] [address]  Report Status PENDING  Incomplete  SARS Coronavirus 2 by RT PCR (hospital order, performed in [facility] hospital lab) Nasopharyngeal Nasopharyngeal Swab     Status: None   Collection Time: 01/21/20  3:14 PM   Specimen: Nasopharyngeal Swab  Result Value Ref Range Status   SARS Coronavirus 2 NEGATIVE NEGATIVE Final    Comment: (NOTE) SARS-CoV-2 target nucleic acids are NOT DETECTED.  The SARS-CoV-2 RNA is generally detectable in upper and lower respiratory specimens during the acute phase of infection. The lowest concentration of SARS-CoV-2 viral copies this assay can detect is 250 copies / mL. A negative result does not preclude SARS-CoV-2 infection and should not be used as the sole basis for treatment or other patient management decisions.  A negative result may occur with improper specimen collection / handling, submission of specimen other than nasopharyngeal swab, presence of viral mutation(s) within the areas targeted by this assay, and inadequate number of viral copies (<250 copies / mL). A negative result must be combined with clinical observations, patient history, and epidemiological information.  Fact Sheet for Patients:   StrictlyIdeas.no  Fact Sheet for Healthcare Providers: BankingDealers.co.za  This test is not yet approved or  cleared by the [place] FDA and has been authorized for detection and/or diagnosis of SARS-CoV-2 by FDA under an Emergency Use Authorization (EUA).  This EUA will  remain in effect (meaning this test can be used) for the duration of the COVID-19 declaration under Section 564(b)(1) of the Act, 21 U.S.C. section 360bbb-3(b)(1), unless the authorization is terminated or revoked sooner.  Performed at [facility], [address], [place], [place] [address]   MRSA PCR Screening     Status: None   Collection Time: 01/22/20  3:31 AM   Specimen: Nasal Mucosa; Nasopharyngeal  Result Value Ref Range Status   MRSA by PCR NEGATIVE NEGATIVE Final    Comment:        The GeneXpert MRSA Assay (FDA approved for NASAL specimens only), is one component of a comprehensive MRSA colonization surveillance program. It is not intended to diagnose MRSA infection nor to guide or monitor treatment for MRSA infections. Performed at [facility], [address] [place], [place] [address]      Radiology Studies: MR 3D Recon At Scanner  Result Date: 01/21/2020 CLINICAL DATA:  Evaluate liver and pancreas lesions EXAM: MRI ABDOMEN WITHOUT AND WITH CONTRAST (INCLUDING MRCP) TECHNIQUE: Multiplanar multisequence MR imaging of the abdomen was performed both before and after the administration of intravenous contrast. Heavily T2-weighted images of the biliary and pancreatic ducts were obtained, and three-dimensional MRCP images were rendered by post processing. CONTRAST:  101mL GADAVIST GADOBUTROL 1 MMOL/ML IV SOLN COMPARISON:  Same day CT abdomen pelvis, 01/21/2020, 04/08/2016, 10/29/2007 FINDINGS: Examination is generally limited by extensive breath motion artifact throughout. Lower chest: Trace pleural effusions. Hepatobiliary: There is a subcapsular mass in the inferior tip of the right lobe of the liver, hepatic segment VI, measuring 3.3 x 2.5 cm, with slight heterogeneous internal T2 hyperintensity, diffusion restriction, and demonstrating very gradual progressive peripheral nodular enhancement (series 19, image 49). An additional lesion in the posterior right lobe of the liver,  hepatic segment VI, measures 0.8 cm and is too small to characterize although likely a subcentimeter cyst or hemangioma. Multiple small gallstones in the dependent gallbladder. Pancreas: There is a lobulated fluid signal, nonenhancing lesion of the pancreatic head immediately adjacent to the central common bile duct measuring 1.5 x 1.4 cm (series 22, image 25, series 4,  image 22). No pancreatic ductal dilatation. Spleen:  Within normal limits in size and appearance. Adrenals/Urinary Tract: No masses identified. No evidence of hydronephrosis. Stomach/Bowel: Visualized portions within the abdomen are unremarkable. Vascular/Lymphatic: No pathologically enlarged lymph nodes identified. No abdominal aortic aneurysm demonstrated. Other:  None. Musculoskeletal: No suspicious bone lesions identified. IMPRESSION: 1. Examination is limited by extensive breath motion artifact throughout. 2. There is a subcapsular mass in the inferior tip of the right lobe of the liver, hepatic segment VI, measuring 3.3 x 2.5 cm, with very gradual progressive peripheral nodular enhancement. 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 3. An additional lesion in the posterior right lobe of the liver, hepatic segment VI, measures 0.8 cm and is too small to characterize although statistically likely a subcentimeter cyst or hemangioma. 4. There is a lobulated fluid signal, nonenhancing lesion of the pancreatic head immediately adjacent to the central common bile duct measuring 1.5 cm. 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 No further follow-up is required. This recommendation follows ACR consensus guidelines: Management of Incidental Pancreatic Cysts: A White Paper of the ACR Incidental Findings Committee. [place] 1517;[id number].  Electronically Signed   By: Eddie Candle M.D.   On: 01/21/2020 13:57   MR ABDOMEN MRCP W WO CONTAST  Result Date: 01/21/2020 CLINICAL DATA:  Evaluate liver and pancreas lesions EXAM: MRI ABDOMEN WITHOUT AND WITH CONTRAST (INCLUDING MRCP) TECHNIQUE: Multiplanar multisequence MR imaging of the abdomen was performed both before and after the administration of intravenous contrast. Heavily T2-weighted images of the biliary and pancreatic ducts were obtained, and three-dimensional MRCP images were rendered by post processing. CONTRAST:  74mL GADAVIST GADOBUTROL 1 MMOL/ML IV SOLN COMPARISON:  Same day CT abdomen pelvis, 01/21/2020, 04/08/2016, 10/29/2007 FINDINGS: Examination is generally limited by extensive breath motion artifact throughout. Lower chest: Trace pleural effusions. Hepatobiliary: There is a subcapsular mass in the inferior tip of the right lobe of the liver, hepatic segment VI, measuring 3.3 x 2.5 cm, with slight heterogeneous internal T2 hyperintensity, diffusion restriction, and demonstrating very gradual progressive peripheral nodular enhancement (series 19, image 49). An additional lesion in the posterior right lobe of the liver, hepatic segment VI, measures 0.8 cm and is too small to characterize although likely a subcentimeter cyst or hemangioma. Multiple small gallstones in the dependent gallbladder. Pancreas: There is a lobulated fluid signal, nonenhancing lesion of the pancreatic head immediately adjacent to the central common bile duct measuring 1.5 x 1.4 cm (series 22, image 25, series 4, image 22). No pancreatic ductal dilatation. Spleen:  Within normal limits in size and appearance. Adrenals/Urinary Tract: No masses identified. No evidence of hydronephrosis. Stomach/Bowel: Visualized portions within the abdomen are unremarkable. Vascular/Lymphatic: No pathologically enlarged lymph nodes identified. No abdominal aortic aneurysm demonstrated. Other:  None. Musculoskeletal: No suspicious bone  lesions identified. IMPRESSION: 1. Examination is limited by extensive breath motion artifact throughout. 2. There is a subcapsular mass in the inferior tip of the right lobe of the liver, hepatic segment VI, measuring 3.3 x 2.5 cm, with very gradual progressive peripheral nodular enhancement. 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 3. An additional lesion in the posterior right lobe of the liver, hepatic segment VI, measures 0.8 cm and is too small to characterize  although statistically likely a subcentimeter cyst or hemangioma. 4. There is a lobulated fluid signal, nonenhancing lesion of the pancreatic head immediately adjacent to the central common bile duct measuring 1.5 cm. 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 No further follow-up is required. This recommendation follows ACR consensus guidelines: Management of Incidental Pancreatic Cysts: A White Paper of the ACR Incidental Findings Committee. [place] 6720;[id number]. Electronically Signed   By: Eddie Candle M.D.   On: 01/21/2020 13:57   CT RENAL STONE STUDY  Result Date: 01/21/2020 CLINICAL DATA:  Back pain, dysuria EXAM: CT ABDOMEN AND PELVIS WITHOUT CONTRAST TECHNIQUE: Multidetector CT imaging of the abdomen and pelvis was performed following the standard protocol without IV contrast. COMPARISON:  03/08/2016 FINDINGS: Lower chest: 5 mm noncalcified posterior right lower lobe pulmonary nodule (series 4, image 40), new from prior. Mild right greater than left bibasilar subsegmental atelectasis. Coronary artery calcifications. Hepatobiliary: Slightly ill-defined 3.0 cm lesion within the inferior aspect of the right hepatic lobe (series 2, image 38), new from prior. There is at least 1 additional subcentimeter indeterminate lesions  within the liver (series 2, image 28), not definitively seen on prior. Cholelithiasis. Gallbladder is mildly distended. No biliary dilatation. Pancreas: Subtle area of relative low attenuation within the posterior aspect of the pancreatic head which appears new from prior (series 2, images 28-29). No peripancreatic stranding. No pancreatic ductal dilatation. No peripancreatic fluid. Spleen: Normal in size without focal abnormality. Adrenals/Urinary Tract: Adrenal glands are unremarkable. Kidneys are normal, without renal calculi, focal lesion, or hydronephrosis. Bladder is decompressed, limiting its evaluation. Stomach/Bowel: Stomach is within normal limits. Appendix not visualized, and may be surgically absent. Scattered colonic diverticulosis. No evidence of bowel wall thickening, distention, or inflammatory changes. Vascular/Lymphatic: Aortoiliac atherosclerotic calcification without aneurysm. Mild ectasia of the right common iliac artery, unchanged. No enlarged abdominal or pelvic lymph nodes. Reproductive: Prostatectomy. Other: No abdominal wall hernia or abnormality. No abdominopelvic ascites. Musculoskeletal: L3-5 PLIF. No acute osseous findings. The sclerotic osseous lesions seen on prior CT 03/08/2016 have resolved. IMPRESSION: 1. Subtle area of relative low attenuation within the posterior aspect of the pancreatic head which appears new from prior. Pancreatic mass is not excluded. When the patient is clinically stable and able to follow directions and hold their breath (preferably as an outpatient) further evaluation with dedicated contrast-enhanced pancreatic protocol MRI of the abdomen should be considered. 2. New slightly ill-defined 3.0 cm lesion within the inferior aspect of the right hepatic lobe with at least 1 additional subcentimeter indeterminate lesions within the liver. Findings are nonspecific but could reflect hepatic metastatic disease. These findings can also be further evaluated on the  previously recommended MRI. 3. 5 mm noncalcified right lower lobe pulmonary nodule, new from prior. 4. The sclerotic osseous lesions seen on prior CT 03/08/2016 have resolved. 5. Cholelithiasis without evidence of acute cholecystitis. 6. Colonic diverticulosis without evidence of acute diverticulitis. 7. Aortic atherosclerosis. (ICD10-I70.0). Electronically Signed   By: Davina Poke D.O.   On: 01/21/2020 09:44    Scheduled Meds: . abiraterone acetate  1,000 mg Oral Daily  . aspirin EC  81 mg Oral Daily  . Chlorhexidine Gluconate Cloth  6 each Topical Daily  . heparin injection (subcutaneous)  5,000 Units Subcutaneous Q8H  . methocarbamol  750 mg Oral TID  . multivitamin with minerals  1 tablet Oral Daily  . rosuvastatin  40 mg Oral QPC  supper  . sodium chloride flush  3 mL Intravenous Q12H   Continuous Infusions: . sodium chloride 150 mL/hr at 01/22/20 0152  . sodium chloride Stopped (01/21/20 2312)  . sodium chloride    . ceFEPime (MAXIPIME) IV 2 g (01/22/20 0843)  . fluconazole (DIFLUCAN) IV 100 mg (01/22/20 0940)  . metronidazole 500 mg (01/22/20 0606)  . norepinephrine (LEVOPHED) Adult infusion Stopped (01/21/20 2311)  . vancomycin       LOS: 1 day   Critical Care Procedure Note Authorized and Performed by: Murvin Natal MD  Total Critical Care time:  38 mins  Due to a high probability of clinically significant, life threatening deterioration, the patient required my highest level of preparedness to intervene emergently and I personally spent this critical care time directly and personally managing the patient.  This critical care time included obtaining a history; examining the patient, pulse oximetry; ordering and review of studies; arranging urgent treatment with development of a management plan; evaluation of patient's response of treatment; frequent reassessment; and discussions with other providers.  This critical care time was performed to assess and manage the high  probability of imminent and life threatening deterioration that could result in multi-organ failure.  It was exclusive of separately billable procedures and treating other patients and teaching time.   Irwin Brakeman, MD How to contact the [facility] Attending or Consulting provider [place] or covering provider during after hours [place], for this patient?  1. Check the care team in [facility] and look for a) attending/consulting [facility] provider listed and b) the [facility] team listed 2. Log into [email or web address] and use [facility] universal password to access. If you do not have the password, please contact the hospital operator. 3. Locate the [facility] provider you are looking for under [facility] and page to a number that you can be directly reached. 4. If you still have difficulty reaching the provider, please page the [facility] (Director on Call) for the Hospitalists listed on amion for assistance.  01/22/2020, 11:25 AM

## 2020-01-23 ENCOUNTER — Inpatient Hospital Stay (HOSPITAL_COMMUNITY): Payer: Medicare Other

## 2020-01-23 DIAGNOSIS — I2510 Atherosclerotic heart disease of native coronary artery without angina pectoris: Secondary | ICD-10-CM

## 2020-01-23 DIAGNOSIS — R7881 Bacteremia: Secondary | ICD-10-CM

## 2020-01-23 DIAGNOSIS — Z7189 Other specified counseling: Secondary | ICD-10-CM

## 2020-01-23 DIAGNOSIS — I4892 Unspecified atrial flutter: Secondary | ICD-10-CM | POA: Diagnosis not present

## 2020-01-23 LAB — COMPREHENSIVE METABOLIC PANEL
ALT: 93 U/L — ABNORMAL HIGH (ref 0–44)
AST: 65 U/L — ABNORMAL HIGH (ref 15–41)
Albumin: 2.1 g/dL — ABNORMAL LOW (ref 3.5–5.0)
Alkaline Phosphatase: 68 U/L (ref 38–126)
Anion gap: 7 (ref 5–15)
BUN: 21 mg/dL (ref 8–23)
CO2: 23 mmol/L (ref 22–32)
Calcium: 8.3 mg/dL — ABNORMAL LOW (ref 8.9–10.3)
Chloride: 108 mmol/L (ref 98–111)
Creatinine, Ser: 1.33 mg/dL — ABNORMAL HIGH (ref 0.61–1.24)
GFR calc Af Amer: 59 mL/min — ABNORMAL LOW (ref >60)
GFR calc non Af Amer: 51 mL/min — ABNORMAL LOW (ref >60)
Glucose, Bld: 143 mg/dL — ABNORMAL HIGH (ref 70–99)
Potassium: 3.4 mmol/L — ABNORMAL LOW (ref 3.5–5.1)
Sodium: 138 mmol/L (ref 135–145)
Total Bilirubin: 0.7 mg/dL (ref 0.3–1.2)
Total Protein: 5.1 g/dL — ABNORMAL LOW (ref 6.5–8.1)

## 2020-01-23 LAB — CBC WITH DIFFERENTIAL/PLATELET
Abs Immature Granulocytes: 0.26 10*3/uL — ABNORMAL HIGH (ref 0.00–0.07)
Basophils Absolute: 0 10*3/uL (ref 0.0–0.1)
Basophils Relative: 0 %
Eosinophils Absolute: 0 10*3/uL (ref 0.0–0.5)
Eosinophils Relative: 0 %
HCT: 33.3 % — ABNORMAL LOW (ref 39.0–52.0)
Hemoglobin: 10.7 g/dL — ABNORMAL LOW (ref 13.0–17.0)
Immature Granulocytes: 3 %
Lymphocytes Relative: 6 %
Lymphs Abs: 0.6 10*3/uL — ABNORMAL LOW (ref 0.7–4.0)
MCH: 30.9 pg (ref 26.0–34.0)
MCHC: 32.1 g/dL (ref 30.0–36.0)
MCV: 96.2 fL (ref 80.0–100.0)
Monocytes Absolute: 0.5 10*3/uL (ref 0.1–1.0)
Monocytes Relative: 4 %
Neutro Abs: 9.2 10*3/uL — ABNORMAL HIGH (ref 1.7–7.7)
Neutrophils Relative %: 87 %
Platelets: 147 10*3/uL — ABNORMAL LOW (ref 150–400)
RBC: 3.46 MIL/uL — ABNORMAL LOW (ref 4.22–5.81)
RDW: 14 % (ref 11.5–15.5)
WBC: 10.5 10*3/uL (ref 4.0–10.5)
nRBC: 0 % (ref 0.0–0.2)

## 2020-01-23 LAB — ECHOCARDIOGRAM COMPLETE
Height: 66 in
Weight: 2592.61 oz

## 2020-01-23 LAB — URINE CULTURE
Culture: NO GROWTH
Special Requests: NORMAL

## 2020-01-23 LAB — MAGNESIUM: Magnesium: 1.7 mg/dL (ref 1.7–2.4)

## 2020-01-23 MED ORDER — METOPROLOL TARTRATE 25 MG PO TABS
12.5000 mg | ORAL_TABLET | Freq: Two times a day (BID) | ORAL | Status: DC
  Administered 2020-01-23 (×2): 12.5 mg via ORAL
  Filled 2020-01-23 (×2): qty 1

## 2020-01-23 MED ORDER — POTASSIUM CHLORIDE CRYS ER 20 MEQ PO TBCR
40.0000 meq | EXTENDED_RELEASE_TABLET | Freq: Once | ORAL | Status: AC
  Administered 2020-01-23: 40 meq via ORAL
  Filled 2020-01-23: qty 2

## 2020-01-23 MED ORDER — METOPROLOL TARTRATE 5 MG/5ML IV SOLN
5.0000 mg | Freq: Four times a day (QID) | INTRAVENOUS | Status: DC | PRN
  Administered 2020-01-23 – 2020-01-24 (×2): 5 mg via INTRAVENOUS
  Filled 2020-01-23 (×2): qty 5

## 2020-01-23 MED ORDER — SODIUM CHLORIDE 0.9 % IV SOLN
2.0000 g | INTRAVENOUS | Status: DC
  Administered 2020-01-23 – 2020-01-27 (×5): 2 g via INTRAVENOUS
  Filled 2020-01-23 (×5): qty 20

## 2020-01-23 MED ORDER — DILTIAZEM HCL 25 MG/5ML IV SOLN
10.0000 mg | INTRAVENOUS | Status: DC | PRN
  Administered 2020-01-23: 10 mg via INTRAVENOUS
  Filled 2020-01-23: qty 5

## 2020-01-23 MED ORDER — METOPROLOL TARTRATE 25 MG PO TABS
25.0000 mg | ORAL_TABLET | Freq: Four times a day (QID) | ORAL | Status: DC
  Administered 2020-01-23 – 2020-01-26 (×15): 25 mg via ORAL
  Filled 2020-01-23 (×16): qty 1

## 2020-01-23 MED ORDER — MAGNESIUM SULFATE 4 GM/100ML IV SOLN
4.0000 g | Freq: Once | INTRAVENOUS | Status: AC
  Administered 2020-01-23: 4 g via INTRAVENOUS
  Filled 2020-01-23: qty 100

## 2020-01-23 NOTE — Consult Note (Addendum)
Cardiology Consult    Patient ID: Samuel Gregory; [id number]; 11-21-1940   Admit date: 01/21/2020 Date of Consult: 01/23/2020  Primary Care Provider: Celene Squibb, MD Primary Cardiologist: Samuel Lesches, MD   Patient Profile    Samuel Gregory is a 79 y.o. male with past medical history of CAD (s/p CABG in 2000, NST in 10/2017 showing mild to moderate per-infarct ischemia with medical management recommended), HTN, HLD and prostate cancer (with bone mets) who is being seen today for the evaluation of atrial fibrillation with RVR at the request of Samuel Gregory.   History of Present Illness    Samuel Gregory most recently had a phone visit with Samuel Gregory in 10/2019 and denied any recent chest pain or palpitations. He was continued on his current medication regimen at that time including ASA, Toprol-XL 56m daily and Crestor 284mdaily.   [facility] for evaluation of back pain with associated abdominal pain and nausea for the past few weeks. He was febrile and tachycardiac upon arrival. WBC was elevated to 15.4. Creatinine was at 1.96 (previously 1.3 in 2018) with AST 75 and ALT 74. EKG on admission showed NSR, HR 77 with no acute ST abnormalities. CT Renal showed a subtle area along the posterior head of the pancreas with a mass not excluded. Also had a new slightly ill-defined 3.0 cm lesion within the inferior aspect of the right hepatic lobe. MRI showed a subcapsular mass in the inferior tip of the right lobe of the liver, hepatic segment VI, measuring 3.3 x 2.5 cm, with very gradual progressive peripheral nodular enhancement concerning for a peripheral cholangiocarcinoma.   He was admitted for sepsis thought to be secondary to ascending cholangitis and started on IV Vancomycin, Flagyl and Cefepime. Initially required Levophed which has since been discontinued. Blood cultures have resulted positive for streptococcus.   Overnight, he experienced intermittent  tachycardia with HR in the 130's, telemetry appearing most consistent with atrial flutter. Was started on Lopressor 12.48m548mID by the admitting team. This morning, he appears to be in sinus rhythm with HR in the 90's to low-100's with occasional PVC's. He denies any palpitations overnight. Was unaware of his arrhythmia. He does mention he has experienced intermittent episodes of palpitations prior to admission but did not inform anyone of this.   Past Medical History:  Diagnosis Date  . Arthritis   . Collagen vascular disease ([facility] . Coronary atherosclerosis of native coronary artery    Multivessel status post CABG 2000  . Essential hypertension   . Hyperlipidemia   . [facility]   Past Surgical History:  Procedure Laterality Date  . APPENDECTOMY     8 yrs ago- Dr ZieGeroge Gregory CATARACT EXTRACTION W/PHACO Left 07/14/2014   Procedure: CATARACT EXTRACTION PHACO AND INTRAOCULAR LENS PLACEMENT (IOC);  Surgeon: Samuel BranchD;  Location: [facility] ORS;  Service: Ophthalmology;  Laterality: Left;  CDE:6.20  . CATARACT EXTRACTION W/PHACO Right 08/11/2014   Procedure: CATARACT EXTRACTION PHACO AND INTRAOCULAR LENS PLACEMENT RIGHT EYE;  Surgeon: Samuel BranchD;  Location: [facility] ORS;  Service: Ophthalmology;  Laterality: Right;  CDE:10.11  . COLONOSCOPY N/A 01/14/2015   Procedure: COLONOSCOPY;  Surgeon: NajRogene HoustonD;  Location: [facility] ENDO SUITE;  Service: Endoscopy;  Laterality: N/A;  730  . CORONARY ARTERY BYPASS GRAFT  01/1999   LIMA to LAD, SVG to diagonal, sequential SVG to PDA and PLB  . PROSTATECTOMY  2004   Samuel Gregory  . TONSILLECTOMY     age 5     Home Medications:  Prior to Admission medications   Medication Sig Start Date End Date Taking? Authorizing Provider  aspirin EC 81 MG tablet Take 1 tablet (81 mg total) by mouth daily. 12/06/18  Yes Samuel Sark, MD  Calcium-Magnesium-Vitamin D (CALCIUM 1200+D3 PO) Take 1 tablet by mouth daily.    Yes [provider]    fluticasone (FLONASE) 50 MCG/ACT nasal spray Place 1 spray into both nostrils daily as needed for allergies or rhinitis.   Yes [provider]  methocarbamol (ROBAXIN) 750 MG tablet Take 750 mg by mouth 3 (three) times daily. 11/18/19  Yes [provider]  metoprolol succinate (TOPROL-XL) 25 MG 24 hr tablet TAKE (1) TABLET BY MOUTH DAILY OR AS OTHERWISE DIRECTED. 08/01/19  Yes Samuel Sark, MD  Multiple Vitamin (MULTIVITAMIN WITH MINERALS) TABS Take 1 tablet by mouth daily.   Yes [provider]  naproxen sodium (ANAPROX) 220 MG tablet Take 220 mg by mouth daily as needed (pain).   Yes [provider]  predniSONE (DELTASONE) 5 MG tablet Take 1 tablet by mouth 2 (two) times daily. 09/20/16  Yes [provider]  ramipril (ALTACE) 5 MG capsule TAKE (1) CAPSULE BY MOUTH ONCE DAILY. 02/27/19  Yes Samuel Sark, MD  rosuvastatin (CRESTOR) 40 MG tablet Take 1 tablet (40 mg total) by mouth daily after supper. 11/29/19 02/27/20 Yes Samuel Sark, MD  Thiamine HCl (VITAMIN B-1 PO) Take 1 tablet by mouth daily.   Yes [provider]  Vitamins/Minerals TABS Take by mouth.   Yes [provider]  ZYTIGA 250 MG tablet Take 4 tablets by mouth daily. 09/20/16  Yes [provider]  nitroGLYCERIN (NITROSTAT) 0.4 MG SL tablet Place 1 tablet (0.4 mg total) under the tongue every 5 (five) minutes x 3 doses as needed for chest pain (if no relief after 3rd dose, proceed to the ED for an evaluation or call 911). 12/06/18 03/06/19  Samuel Sark, MD  predniSONE (DELTASONE) 20 MG tablet Take 20 mg by mouth 2 (two) times daily. Patient not taking: Reported on 01/21/2020 11/18/19   [provider]    Inpatient Medications: Scheduled Meds: . abiraterone acetate  1,000 mg Oral Daily  . aspirin EC  81 mg Oral Daily  . Chlorhexidine Gluconate Cloth  6 each Topical Daily  . heparin injection (subcutaneous)  5,000 Units Subcutaneous Q8H   . methocarbamol  750 mg Oral TID  . metoprolol tartrate  25 mg Oral QID  . multivitamin with minerals  1 tablet Oral Daily  . predniSONE  5 mg Oral BID WC  . rosuvastatin  40 mg Oral QPC supper  . sodium chloride flush  3 mL Intravenous Q12H   Continuous Infusions: . sodium chloride 125 mL/hr at 01/23/20 0955  . sodium chloride Stopped (01/21/20 2312)  . sodium chloride    . cefTRIAXone (ROCEPHIN)  IV    . fluconazole (DIFLUCAN) IV 100 mg (01/23/20 1006)   PRN Meds: sodium chloride, acetaminophen **OR** acetaminophen, albuterol, diltiazem, fluticasone, nitroGLYCERIN, ondansetron **OR** ondansetron (ZOFRAN) IV, polyethylene glycol, sodium chloride flush, traZODone  Allergies:    Allergies  Allergen Reactions  . Latex Rash  . Sulfa Antibiotics Rash    Social History:   Social History   Socioeconomic History  . Marital status: Married    Spouse name: Not on file  . Number of children: Not on  file  . Years of education: Not on file  . Highest education level: Not on file  Occupational History  . Not on file  Tobacco Use  . Smoking status: Never Smoker  . Smokeless tobacco: Never Used  Vaping Use  . Vaping Use: Never used  Substance and Sexual Activity  . Alcohol use: Yes    Alcohol/week: 0.0 standard drinks    Comment: daily-glass of wine  . Drug use: No  . Sexual activity: Not on file  Other Topics Concern  . Not on file  Social History Narrative  . Not on file   Social Determinants of Health   Financial Resource Strain:   . Difficulty of Paying Living Expenses:   Food Insecurity:   . Worried About Charity fundraiser in the Last Year:   . Arboriculturist in the Last Year:   Transportation Needs:   . Film/video editor (Medical):   Marland Kitchen Lack of Transportation (Non-Medical):   Physical Activity:   . Days of Exercise per Week:   . Minutes of Exercise per Session:   Stress:   . Feeling of Stress :   Social Connections:   . Frequency of Communication  with Friends and Family:   . Frequency of Social Gatherings with Friends and Family:   . Attends Religious Services:   . Active Member of Clubs or Organizations:   . Attends Archivist Meetings:   Marland Kitchen Marital Status:   Intimate Partner Violence:   . Fear of Current or Ex-Partner:   . Emotionally Abused:   Marland Kitchen Physically Abused:   . Sexually Abused:      Family History:    Family History  Problem Relation Age of Onset  . Heart attack Mother   . Cancer Mother   . Aneurysm Father       Review of Systems    General:  No night sweats or weight changes.  Positive for fever and chills.  Cardiovascular:  No chest pain, dyspnea on exertion, edema, orthopnea, palpitations, paroxysmal nocturnal dyspnea. Dermatological: No rash, lesions/masses Respiratory: No cough, dyspnea Urologic: No hematuria, dysuria Abdominal:   No nausea, vomiting, diarrhea, bright red blood per rectum, melena, or hematemesis Neurologic:  No visual changes, wkns, changes in mental status. All other systems reviewed and are otherwise negative except as noted above.  Physical Exam/Data    Vitals:   01/23/20 0800 01/23/20 0900 01/23/20 0947 01/23/20 1000  BP: 129/90 (!) 128/92    Pulse: (!) 139 (!) 140 90 96  Resp: (!) 23 (!) 27 (!) 28 (!) 28  Temp:      TempSrc:      SpO2: 99% 97% 91% 100%  Weight:      Height:        Intake/Output Summary (Last 24 hours) at 01/23/2020 1115 Last data filed at 01/23/2020 1008 Gross per 24 hour  Intake 5568.84 ml  Output 3400 ml  Net 2168.84 ml   Filed Weights   01/22/20 0342 01/22/20 0500 01/23/20 0500  Weight: 73.2 kg 73.2 kg 73.5 kg   Body mass index is 26.15 kg/m.   General: Pleasant male appearing in NAD Psych: Normal affect. Neuro: Alert and oriented X 3. Moves all extremities spontaneously. HEENT: Normal  Neck: Supple without bruits or JVD. Lungs:  Resp regular and unlabored, CTA without wheezing or rales. Heart: Regular rhythm, tachycardiac rate.  no s3, s4, or murmurs. Abdomen: Soft, non-tender, non-distended, BS + x 4.  Extremities: No clubbing,  cyanosis or edema. DP/PT/Radials 2+ and equal bilaterally.   EKG:  The EKG was personally reviewed and demonstrates: NSR, HR 77 with no acute ST abnormalities.   Labs/Studies     Relevant CV Studies:  Echocardiogram: 2012     NST: 10/2017  No diagnostic ST segment changes to indicate ischemia.  Small, moderate intensity, inferior septal defect that is reversible at the apex and partially reversible in the mid to basal segment. This is consistent with scar with mild to moderate peri-infarct ischemia.  This is an intermediate risk study.  Nuclear stress EF: 62%.     Laboratory Data:  Chemistry Recent Labs  Lab 01/21/20 [phone] 01/21/20 0838 01/21/20 2057 01/22/20 0430 01/23/20 0421  NA 132*  --   --  137 138  K 3.9  --   --  4.2 3.4*  CL 95*  --   --  103 108  CO2 25  --   --  21* 23  GLUCOSE 117*  --   --  90 143*  BUN 36*  --   --  30* 21  CREATININE 1.96*   < > 1.50* 1.38* 1.33*  CALCIUM 9.1  --   --  8.6* 8.3*  GFRNONAA 32*   < > 44* 49* 51*  GFRAA 37*   < > 51* 56* 59*  ANIONGAP 12  --   --  13 7   < > = values in this interval not displayed.    Recent Labs  Lab 01/21/20 0838 01/22/20 0430 01/23/20 0421  PROT 6.2* 5.7* 5.1*  ALBUMIN 2.7* 2.3* 2.1*  AST 75* 153* 65*  ALT 74* 141* 93*  ALKPHOS 77 75 68  BILITOT 0.9 0.8 0.7   Hematology Recent Labs  Lab 01/21/20 0838 01/22/20 0430 01/23/20 0421  WBC 15.4* 10.1 10.5  RBC 3.95* 3.74* 3.46*  HGB 12.4* 11.5* 10.7*  HCT 38.0* 36.3* 33.3*  MCV 96.2 97.1 96.2  MCH 31.4 30.7 30.9  MCHC 32.6 31.7 32.1  RDW 13.9 14.0 14.0  PLT 180 127* 147*   Cardiac EnzymesNo results for input(s): TROPONINI in the last 168 hours. No results for input(s): TROPIPOC in the last 168 hours.  BNPNo results for input(s): BNP, PROBNP in the last 168 hours.  DDimer No results for input(s): DDIMER in the last 168  hours.  Radiology/Studies:  MR 3D Recon At Scanner  Result Date: 01/21/2020 CLINICAL DATA:  Evaluate liver and pancreas lesions EXAM: MRI ABDOMEN WITHOUT AND WITH CONTRAST (INCLUDING MRCP) TECHNIQUE: Multiplanar multisequence MR imaging of the abdomen was performed both before and after the administration of intravenous contrast. Heavily T2-weighted images of the biliary and pancreatic ducts were obtained, and three-dimensional MRCP images were rendered by post processing. CONTRAST:  74m GADAVIST GADOBUTROL 1 MMOL/ML IV SOLN COMPARISON:  Same day CT abdomen pelvis, 01/21/2020, 04/08/2016, 10/29/2007 FINDINGS: Examination is generally limited by extensive breath motion artifact throughout. Lower chest: Trace pleural effusions. Hepatobiliary: There is a subcapsular mass in the inferior tip of the right lobe of the liver, hepatic segment VI, measuring 3.3 x 2.5 cm, with slight heterogeneous internal T2 hyperintensity, diffusion restriction, and demonstrating very gradual progressive peripheral nodular enhancement (series 19, image 49). An additional lesion in the posterior right lobe of the liver, hepatic segment VI, measures 0.8 cm and is too small to characterize although likely a subcentimeter cyst or hemangioma. Multiple small gallstones in the dependent gallbladder. Pancreas: There is a lobulated fluid signal, nonenhancing lesion of the pancreatic head immediately adjacent  to the central common bile duct measuring 1.5 x 1.4 cm (series 22, image 25, series 4, image 22). No pancreatic ductal dilatation. Spleen:  Within normal limits in size and appearance. Adrenals/Urinary Tract: No masses identified. No evidence of hydronephrosis. Stomach/Bowel: Visualized portions within the abdomen are unremarkable. Vascular/Lymphatic: No pathologically enlarged lymph nodes identified. No abdominal aortic aneurysm demonstrated. Other:  None. Musculoskeletal: No suspicious bone lesions identified. IMPRESSION: 1. Examination  is limited by extensive breath motion artifact throughout. 2. There is a subcapsular mass in the inferior tip of the right lobe of the liver, hepatic segment VI, measuring 3.3 x 2.5 cm, with very gradual progressive peripheral nodular enhancement. 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 3. An additional lesion in the posterior right lobe of the liver, hepatic segment VI, measures 0.8 cm and is too small to characterize although statistically likely a subcentimeter cyst or hemangioma. 4. There is a lobulated fluid signal, nonenhancing lesion of the pancreatic head immediately adjacent to the central common bile duct measuring 1.5 cm. This is most likely a side Oaklynn Stierwalt intraductal papillary mucinous neoplasm and is essentially stable when compared to remote prior CT dated 10/29/2007. No further follow-up is required. This recommendation follows ACR consensus guidelines: Management of Incidental Pancreatic Cysts: A White Paper of the ACR Incidental Findings Committee. [place] 5093;26:[id number]. Electronically Signed   By: Eddie Candle M.D.   On: 01/21/2020 13:57   MR ABDOMEN MRCP W WO CONTAST  Result Date: 01/21/2020 CLINICAL DATA:  Evaluate liver and pancreas lesions EXAM: MRI ABDOMEN WITHOUT AND WITH CONTRAST (INCLUDING MRCP) TECHNIQUE: Multiplanar multisequence MR imaging of the abdomen was performed both before and after the administration of intravenous contrast. Heavily T2-weighted images of the biliary and pancreatic ducts were obtained, and three-dimensional MRCP images were rendered by post processing. CONTRAST:  61m GADAVIST GADOBUTROL 1 MMOL/ML IV SOLN COMPARISON:  Same day CT abdomen pelvis, 01/21/2020, 04/08/2016, 10/29/2007 FINDINGS: Examination is generally limited by extensive breath motion artifact throughout. Lower chest: Trace pleural effusions. Hepatobiliary: There is a subcapsular  mass in the inferior tip of the right lobe of the liver, hepatic segment VI, measuring 3.3 x 2.5 cm, with slight heterogeneous internal T2 hyperintensity, diffusion restriction, and demonstrating very gradual progressive peripheral nodular enhancement (series 19, image 49). An additional lesion in the posterior right lobe of the liver, hepatic segment VI, measures 0.8 cm and is too small to characterize although likely a subcentimeter cyst or hemangioma. Multiple small gallstones in the dependent gallbladder. Pancreas: There is a lobulated fluid signal, nonenhancing lesion of the pancreatic head immediately adjacent to the central common bile duct measuring 1.5 x 1.4 cm (series 22, image 25, series 4, image 22). No pancreatic ductal dilatation. Spleen:  Within normal limits in size and appearance. Adrenals/Urinary Tract: No masses identified. No evidence of hydronephrosis. Stomach/Bowel: Visualized portions within the abdomen are unremarkable. Vascular/Lymphatic: No pathologically enlarged lymph nodes identified. No abdominal aortic aneurysm demonstrated. Other:  None. Musculoskeletal: No suspicious bone lesions identified. IMPRESSION: 1. Examination is limited by extensive breath motion artifact throughout. 2. There is a subcapsular mass in the inferior tip of the right lobe of the liver, hepatic segment VI, measuring 3.3 x 2.5 cm, with very gradual progressive peripheral nodular enhancement. 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 3. An additional lesion in the posterior  right lobe of the liver, hepatic segment VI, measures 0.8 cm and is too small to characterize although statistically likely a subcentimeter cyst or hemangioma. 4. There is a lobulated fluid signal, nonenhancing lesion of the pancreatic head immediately adjacent to the central common bile duct measuring 1.5 cm. This is most likely a side Nyra Anspaugh  intraductal papillary mucinous neoplasm and is essentially stable when compared to remote prior CT dated 10/29/2007. No further follow-up is required. This recommendation follows ACR consensus guidelines: Management of Incidental Pancreatic Cysts: A White Paper of the ACR Incidental Findings Committee. [place] 9163;[id number]. Electronically Signed   By: Eddie Candle M.D.   On: 01/21/2020 13:57   CT RENAL STONE STUDY  Result Date: 01/21/2020 CLINICAL DATA:  Back pain, dysuria EXAM: CT ABDOMEN AND PELVIS WITHOUT CONTRAST TECHNIQUE: Multidetector CT imaging of the abdomen and pelvis was performed following the standard protocol without IV contrast. COMPARISON:  03/08/2016 FINDINGS: Lower chest: 5 mm noncalcified posterior right lower lobe pulmonary nodule (series 4, image 40), new from prior. Mild right greater than left bibasilar subsegmental atelectasis. Coronary artery calcifications. Hepatobiliary: Slightly ill-defined 3.0 cm lesion within the inferior aspect of the right hepatic lobe (series 2, image 38), new from prior. There is at least 1 additional subcentimeter indeterminate lesions within the liver (series 2, image 28), not definitively seen on prior. Cholelithiasis. Gallbladder is mildly distended. No biliary dilatation. Pancreas: Subtle area of relative low attenuation within the posterior aspect of the pancreatic head which appears new from prior (series 2, images 28-29). No peripancreatic stranding. No pancreatic ductal dilatation. No peripancreatic fluid. Spleen: Normal in size without focal abnormality. Adrenals/Urinary Tract: Adrenal glands are unremarkable. Kidneys are normal, without renal calculi, focal lesion, or hydronephrosis. Bladder is decompressed, limiting its evaluation. Stomach/Bowel: Stomach is within normal limits. Appendix not visualized, and may be surgically absent. Scattered colonic diverticulosis. No evidence of bowel wall thickening, distention, or inflammatory  changes. Vascular/Lymphatic: Aortoiliac atherosclerotic calcification without aneurysm. Mild ectasia of the right common iliac artery, unchanged. No enlarged abdominal or pelvic lymph nodes. Reproductive: Prostatectomy. Other: No abdominal wall hernia or abnormality. No abdominopelvic ascites. Musculoskeletal: L3-5 PLIF. No acute osseous findings. The sclerotic osseous lesions seen on prior CT 03/08/2016 have resolved. IMPRESSION: 1. Subtle area of relative low attenuation within the posterior aspect of the pancreatic head which appears new from prior. Pancreatic mass is not excluded. When the patient is clinically stable and able to follow directions and hold their breath (preferably as an outpatient) further evaluation with dedicated contrast-enhanced pancreatic protocol MRI of the abdomen should be considered. 2. New slightly ill-defined 3.0 cm lesion within the inferior aspect of the right hepatic lobe with at least 1 additional subcentimeter indeterminate lesions within the liver. Findings are nonspecific but could reflect hepatic metastatic disease. These findings can also be further evaluated on the previously recommended MRI. 3. 5 mm noncalcified right lower lobe pulmonary nodule, new from prior. 4. The sclerotic osseous lesions seen on prior CT 03/08/2016 have resolved. 5. Cholelithiasis without evidence of acute cholecystitis. 6. Colonic diverticulosis without evidence of acute diverticulitis. 7. Aortic atherosclerosis. (ICD10-I70.0). Electronically Signed   By: Davina Poke D.O.   On: 01/21/2020 09:44     Assessment & Plan    1. Atrial Flutter with RVR - developed tachycardia around 2300 on 01/22/2020 which appears most consistent with atrial flutter by review of telemetry. He has converted back to NSR at this time but HR remains in the 90's to 110's and  he is at increased risk of recurrent arrhythmias given his acute illness.  - K+ 3.4 this AM and Mg 1.7. Both has been replaced by the  admitting team. An echocardiogram has also been ordered but would wait for his rates to improve prior to obtaining this.  - Now that BP has improved, agree with initiation of Lopressor. Would further titrate to at least 86m BID if BP allows. No indication for IV Amiodarone for now as long as BP remains stable.  - This patients CHA2DS2-VASc Score and unadjusted Ischemic Stroke Rate (% per year) is equal to 4.8 % stroke rate/year from a score of 4 (HTN, Vascular, Age (2)). Not an ideal anticoagulation candidate given his known prostate cancer and recently noted liver lesion. He does report intermittent palpitations prior to admission so could consider a monitor as an outpatient once further out from his acute illness to assess for recurrence.    2. CAD - he is s/p CABG in 2000 with NST in 10/2017 showing mild to moderate per-infarct ischemia with medical management recommended. EKG without acute ST changes this admission.  - continue ASA 827mdaily and BB therapy. He has been resumed on statin therapy but would follow LFT's closely (AST improved to 65 this AM with ALT at 93).   3. Sepsis/ Streptococcus Bacteremia - met Sepsis criteria on admission. WBC initially elevated to 15.4, improved to 10.5 this AM. Hypotension resolved and no longer requiring pressor support. Was started on IV Vancomycin, Flagyl and Cefepime on admission which has now been deescalated to Rocephin. Further management per Critical Care and admitting team.   4. Liver Lesion - MRI this admission shows a subcapsular mass in the inferior tip of the right lobe of the liver, hepatic segment VI, measuring 3.3 x 2.5 cm, with very gradual progressive peripheral nodular enhancement concerning for a peripheral cholangiocarcinoma. Being followed by GI.     For questions or updates, please contact [facility] consult [email or web address] for contact info under Cardiology/STEMI.  Signed, BrErma HeritagePA-C 01/23/2020, 11:15  AM Pager: [phone] Patient is seen and discussed with PA StAhmed PrimaI agree with her documentation. [age] male history of CAD with prior CABG in 2000, HTN, metastatic prostate cancer, HL admitted with septic shock. Transiently required pressors, now off.  Admission further complicated by detection of a liver mass concerning for cholangiocarcinoma. Found to have gram + cocci in blood. Cardiology consulted due to issues with aflutter with RVR this admission, a new finding. The aflutter has been paroxysmal.    K 3.4 Cr 1.33 BUN 21 AST 65 ALT 93 Tbili 0.7 WBC 10.5 Plt 147 Mg 1.7  Paroxysmal aflutter in the setting of severe systemic illness with sepsis and hypokalemia. BP's have improved with his sepsis, he was started on lopressor this AM, would increaes to 2514mvery 6 hours with hold parameters. CHADS2Vasc score of 4 (HTN, Age x2, CAD). He has a newly discovered liver mass, we will wait on guidance from GI workup whether or not anticoagulation could be considered or not   JonCarlyle Dolly

## 2020-01-23 NOTE — Progress Notes (Addendum)
Subjective:  Patient states he felt very well this morning and was able to eat most of his breakfast but he began to feel bad when he developed fever chills and his heart rate increased to 160.  He also became short of breath but did not experience any chest pain.  He denies abdominal pain or diarrhea.  Current Medications:  Current Facility-Administered Medications:  .  0.9 %  sodium chloride infusion, , Intravenous, Continuous, Johnson, Clanford L, MD, Last Rate: 40 mL/hr at 01/23/20 1332, Rate Change at 01/23/20 1332 .  0.9 %  sodium chloride infusion, 250 mL, Intravenous, Continuous, Roxan Hockey, MD, Held at 01/21/20 2312 .  0.9 %  sodium chloride infusion, 250 mL, Intravenous, PRN, Emokpae, Courage, MD .  abiraterone acetate (ZYTIGA) tablet 1,000 mg, 1,000 mg, Oral, Daily, Emokpae, Courage, MD, 1,000 mg at 01/23/20 1004 .  acetaminophen (TYLENOL) tablet 650 mg, 650 mg, Oral, Q6H PRN, 650 mg at 01/23/20 1341 **OR** acetaminophen (TYLENOL) suppository 650 mg, 650 mg, Rectal, Q6H PRN, Emokpae, Courage, MD .  albuterol (PROVENTIL) (2.5 MG/3ML) 0.083% nebulizer solution 2.5 mg, 2.5 mg, Nebulization, Q2H PRN, Emokpae, Courage, MD, 2.5 mg at 01/23/20 1148 .  aspirin EC tablet 81 mg, 81 mg, Oral, Daily, Emokpae, Courage, MD, 81 mg at 01/23/20 1003 .  cefTRIAXone (ROCEPHIN) 2 g in sodium chloride 0.9 % 100 mL IVPB, 2 g, Intravenous, Q24H, Johnson, Clanford L, MD, Stopped at 01/23/20 1226 .  Chlorhexidine Gluconate Cloth 2 % PADS 6 each, 6 each, Topical, Daily, Reubin Milan, MD, 6 each at 01/23/20 1004 .  diltiazem (CARDIZEM) injection 10 mg, 10 mg, Intravenous, Q4H PRN, Johnson, Clanford L, MD, 10 mg at 01/23/20 1302 .  fluconazole (DIFLUCAN) IVPB 100 mg, 100 mg, Intravenous, Q24H, Johnson, Clanford L, MD, Stopped at 01/23/20 1106 .  fluticasone (FLONASE) 50 MCG/ACT nasal spray 1 spray, 1 spray, Each Nare, Daily PRN, Emokpae, Courage, MD .  heparin injection 5,000 Units, 5,000 Units,  Subcutaneous, Q8H, Emokpae, Courage, MD, 5,000 Units at 01/23/20 1331 .  methocarbamol (ROBAXIN) tablet 750 mg, 750 mg, Oral, TID, Emokpae, Courage, MD, 750 mg at 01/23/20 1001 .  metoprolol tartrate (LOPRESSOR) injection 5 mg, 5 mg, Intravenous, Q6H PRN, Ahmed Prima, Brittany M, PA-C, 5 mg at 01/23/20 1329 .  metoprolol tartrate (LOPRESSOR) tablet 25 mg, 25 mg, Oral, QID, Arnoldo Lenis, MD, 25 mg at 01/23/20 1152 .  multivitamin with minerals tablet 1 tablet, 1 tablet, Oral, Daily, Emokpae, Courage, MD, 1 tablet at 01/23/20 1003 .  nitroGLYCERIN (NITROSTAT) SL tablet 0.4 mg, 0.4 mg, Sublingual, Q5 Min x 3 PRN, Emokpae, Courage, MD .  ondansetron (ZOFRAN) tablet 4 mg, 4 mg, Oral, Q6H PRN **OR** ondansetron (ZOFRAN) injection 4 mg, 4 mg, Intravenous, Q6H PRN, Emokpae, Courage, MD .  polyethylene glycol (MIRALAX / GLYCOLAX) packet 17 g, 17 g, Oral, Daily PRN, Emokpae, Courage, MD .  predniSONE (DELTASONE) tablet 5 mg, 5 mg, Oral, BID WC, Johnson, Clanford L, MD, 5 mg at 01/23/20 0735 .  rosuvastatin (CRESTOR) tablet 40 mg, 40 mg, Oral, QPC supper, Emokpae, Courage, MD, 40 mg at 01/22/20 1655 .  sodium chloride flush (NS) 0.9 % injection 3 mL, 3 mL, Intravenous, Q12H, Emokpae, Courage, MD, 3 mL at 01/23/20 1008 .  sodium chloride flush (NS) 0.9 % injection 3 mL, 3 mL, Intravenous, PRN, Emokpae, Courage, MD .  traZODone (DESYREL) tablet 50 mg, 50 mg, Oral, QHS PRN, Emokpae, Courage, MD  Objective: Blood pressure 105/69, pulse 87, temperature 99.8 F (  37.7 C), temperature source Oral, resp. rate (!) 27, height 5' 6"  (1.676 m), weight 73.5 kg, SpO2 95 %. Patient is alert and in no acute distress. Cardiac exam with regular rhythm normal S1 and S2. No murmur or gallop noted. Lungs are clear to auscultation. Abdomen pain full.  Bowel sounds are normal.  He has ecchymosis in the right lower quadrant extending posterior laterally.  On palpation abdomen is soft and nontender without organomegaly or  masses. No LE edema or clubbing noted.  Labs/studies Results:  CBC Latest Ref Rng & Units 01/23/2020 01/22/2020 01/21/2020  WBC 4.0 - 10.5 K/uL 10.5 10.1 15.4(H)  Hemoglobin 13.0 - 17.0 g/dL 10.7(L) 11.5(L) 12.4(L)  Hematocrit 39 - 52 % 33.3(L) 36.3(L) 38.0(L)  Platelets 150 - 400 K/uL 147(L) 127(L) 180    CMP Latest Ref Rng & Units 01/23/2020 01/22/2020 01/21/2020  Glucose 70 - 99 mg/dL 143(H) 90 -  BUN 8 - 23 mg/dL 21 30(H) -  Creatinine 0.61 - 1.24 mg/dL 1.33(H) 1.38(H) 1.50(H)  Sodium 135 - 145 mmol/L 138 137 -  Potassium 3.5 - 5.1 mmol/L 3.4(L) 4.2 -  Chloride 98 - 111 mmol/L 108 103 -  CO2 22 - 32 mmol/L 23 21(L) -  Calcium 8.9 - 10.3 mg/dL 8.3(L) 8.6(L) -  Total Protein 6.5 - 8.1 g/dL 5.1(L) 5.7(L) -  Total Bilirubin 0.3 - 1.2 mg/dL 0.7 0.8 -  Alkaline Phos 38 - 126 U/L 68 75 -  AST 15 - 41 U/L 65(H) 153(H) -  ALT 0 - 44 U/L 93(H) 141(H) -    Hepatic Function Latest Ref Rng & Units 01/23/2020 01/22/2020 01/21/2020  Total Protein 6.5 - 8.1 g/dL 5.1(L) 5.7(L) 6.2(L)  Albumin 3.5 - 5.0 g/dL 2.1(L) 2.3(L) 2.7(L)  AST 15 - 41 U/L 65(H) 153(H) 75(H)  ALT 0 - 44 U/L 93(H) 141(H) 74(H)  Alk Phosphatase 38 - 126 U/L 68 75 77  Total Bilirubin 0.3 - 1.2 mg/dL 0.7 0.8 0.9    Blood cultures positive for Streptococcus species.  Urine culture negative at 48 hours.  CEA of 4.3  CA 19-9 16.   Assessment:  #1.  Sepsis.  Patient presented with fever and rigors.  He had elevated transaminases and he has cholelithiasis.  MRCP was negative for choledocholithiasis.  It was felt that he may have passed a small stone or elevated transaminases secondary to sepsis and intrahepatic cholestasis.  He however is going strep species in his blood.  He only had culture from right antecubital vein.  He does not have any skin infection.  Echo is negative for vegetations.  Although unlikely source still could be biliary.  If he spikes again he needs to be recultured.  Antibiotic coverage has been tailored  based on the blood cultures that he is now on ceftriaxone.  #2.  Hepatic lesion along the inferior aspect of right lobe.  Work-up pending.  He will need ultrasound or CT-guided biopsy once acute illness has resolved.  If he spikes again would consider doing ultrasound and see if there is any change in appearance of this lesion suggest that it might be an evolving abscess.  #3.  Paroxysmal atrial flutter.  Patient had spell with rapid ventricular rate earlier today when he had temp spike.  He has been evaluated by Dr. Harl Bowie cardiology service and has converted to normal sinus rhythm with therapy.  #4.  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  No further work-up indicated lesion has remained  stable for the past few years.  #5.  History of bilateral hip and leg pain.  He has metastatic prostate carcinoma and appears to be stable on antiandrogenic therapy.  He actually is having much less pain since he has been bedridden.  #6.  Anemia and thrombocytopenia.  Drop in H&H most likely due to acute illness.  No evidence of GI bleed.  Platelet count was normal on admission and dropped to 127K yesterday and now coming back up.  Recommendations  Blood cultures x 2 if temperature rises above 100F. Repeat LFTs in a.m with sed rate.

## 2020-01-23 NOTE — Progress Notes (Signed)
Palliative: Samuel Gregory is resting quietly in bed.  He greets me making and keeping eye contact.  He is alert and oriented, calm and cooperative.  Able to make his needs known.  Present today at bedside is his wife Samuel Gregory, and his daughter Samuel Gregory.  We talk in detail about his acute illness including, but not limited to, bacteremia, yeast in urine culture, increased heart rate, antibiotic regimen, the treatment plan, medications, physical therapy evaluation and short-term rehab options.   Daughter Samuel Gregory is a Equities trader and knowledgeable and realistic about Samuel Gregory health concerns.  We talked about PT evaluation and short-term rehab as a bridge from the hospital safely home.  Samuel Gregory is considering his choices.  I share that I anticipate several months before he is back to baseline, I share this as encouragement to not be disappointed when he does not bounce back quickly.  We talked about CODE STATUS, see below.  Conference with attending, bedside nursing staff, transition of care team, related to patient condition, needs, goals of care, discharge plan.  Plan: At this point continue to treat the treatable.  CPR, intubation/ventilation for 1 week.  Samuel Gregory is agreeable PT evaluation and the possibility of short-term rehab if qualified.  Family states their first choice would be [facility].    CODE STATUS:   Samuel Gregory tells me that he would want CPR and intubation, but not long-term.  He tells me that he would like 1 week on life support, if needed.  I asked Samuel Gregory if he were unable to speak, and his family got new information, could they make a different choice, no life support.  He states that they could make a different choice if he could not speak and they had new information.  64 minutes Samuel Axe, NP Palliative Medicine Team Team Phone # [phone] Greater than 50% of this time was spent counseling and coordinating care related to the above assessment and plan.

## 2020-01-23 NOTE — Progress Notes (Signed)
*  PRELIMINARY RESULTS* Echocardiogram 2D Echocardiogram has been performed.  Samuel Gregory 01/23/2020, 11:37 AM

## 2020-01-23 NOTE — Progress Notes (Signed)
PROGRESS NOTE   Samuel Gregory  RCV:[id number] DOB: [birth year] DOA: 01/21/2020 PCP: Celene Squibb, MD   Chief Complaint  Patient presents with  . Back Pain   Brief Admission History:  79 year old gentleman with collagen vascular disease, CAD, state cancer that is metastatic, hypertension presented to the emergency department with severe back pain and admitted with severe sepsis.  Assessment & Plan:   Principal Problem:   Severe sepsis with septic shock [facility]) Active Problems:   Essential hypertension   Coronary atherosclerosis of native coronary artery   Prostate cancer --with bony metastases, status post prior prostatectomy in 2004   Hypotension   Abdominal pain-Pancreatic and liver masses   Goals of care, counseling/discussion   Palliative care by specialist   DNR (do not resuscitate) discussion   Atrial fibrillation with RVR (Theresa)  1. Severe sepsis from strep species / strep bacteremia-deescalate to ceftriaxone 2 gm IV daily.  Continue supportive care. 2. Elevated LFTs-coming down, appreciate gastroenterology consultation and recommendations.  Follow LFTs. 3. Atrial flutter with RVR - rate has been intermittently uncontrolled, metoprolol restarted, appreciate cardiology consult and recommendations.  4. AKI-prerenal-improved with IV fluid hydration. 5. Leukocytosis-WBC trending down to normal with treatments. 6. Hypotension-markedly improved and has been weaned off the Levophed infusion. 7. Metastatic prostate cancer with bone metastases-monitor closely and I have requested a palliative medicine consultation for assistance with goals of care.  His mortality risk remains very high.  DVT prophylaxis: Heparin/SCDs Code Status: Full Family Communication: Spouse and daughter at bedside Disposition:   Status is: Inpatient  Remains inpatient appropriate because:Hemodynamically unstable, Persistent severe electrolyte disturbances and IV treatments appropriate due to intensity of  illness or inability to take PO  Dispo: The patient is from: Home              Anticipated d/c is to: TBD              Anticipated d/c date is: 2 days              Patient currently is not medically stable to d/c.  Consultants:   GI  Palliative   Procedures:   Central line placement  Antimicrobials:  Anti-infectives (From admission, onward)   Start     Dose/Rate Route Frequency Ordered Stop   01/22/20 2200  vancomycin (VANCOCIN) IVPB 1000 mg/200 mL premix     Discontinue     1,000 mg 200 mL/hr over 60 Minutes Intravenous Every 24 hours 01/21/20 2203     01/22/20 0930  ceFEPIme (MAXIPIME) 2 g in sodium chloride 0.9 % 100 mL IVPB     Discontinue     2 g 200 mL/hr over 30 Minutes Intravenous Every 12 hours 01/21/20 2202     01/22/20 0900  fluconazole (DIFLUCAN) IVPB 100 mg     Discontinue     100 mg 50 mL/hr over 60 Minutes Intravenous Every 24 hours 01/22/20 0829 01/25/20 0859   01/21/20 2200  metroNIDAZOLE (FLAGYL) IVPB 500 mg     Discontinue     500 mg 100 mL/hr over 60 Minutes Intravenous Every 8 hours 01/21/20 2113     01/21/20 2115  vancomycin (VANCOREADY) IVPB 1500 mg/300 mL        1,500 mg 150 mL/hr over 120 Minutes Intravenous  Once 01/21/20 2101 01/22/20 0315   01/21/20 2115  ceFEPIme (MAXIPIME) 2 g in sodium chloride 0.9 % 100 mL IVPB  Status:  Discontinued        2 g 200 mL/hr  over 30 Minutes Intravenous Every 24 hours 01/21/20 2101 01/21/20 2202   01/21/20 1715  cefTRIAXone (ROCEPHIN) 2 g in sodium chloride 0.9 % 100 mL IVPB  Status:  Discontinued        2 g 200 mL/hr over 30 Minutes Intravenous Every 24 hours 01/21/20 1702 01/21/20 2026      Subjective: Patient says he is less SOB, denies palpitations.  He denies chest pain.    Objective: Vitals:   01/23/20 0800 01/23/20 0900 01/23/20 0947 01/23/20 1000  BP: 129/90 (!) 128/92    Pulse: (!) 139 (!) 140 90 96  Resp: (!) 23 (!) 27 (!) 28 (!) 28  Temp:      TempSrc:      SpO2: 99% 97% 91% 100%  Weight:       Height:        Intake/Output Summary (Last 24 hours) at 01/23/2020 1044 Last data filed at 01/23/2020 1008 Gross per 24 hour  Intake 5568.84 ml  Output 3400 ml  Net 2168.84 ml   Filed Weights   01/22/20 0342 01/22/20 0500 01/23/20 0500  Weight: 73.2 kg 73.2 kg 73.5 kg    Examination:  General exam: She appears chronically ill and acutely ill.  No apparent distress.  Speaking in full sentences. Respiratory system:  Respiratory effort normal. Cardiovascular system: S1 & S2 heard, tachycardic rate. No JVD, murmurs, rubs, gallops or clicks. No pedal edema. Gastrointestinal system: Abdomen is nondistended, soft and nontender. No organomegaly or masses felt. Normal bowel sounds heard. Central nervous system: Alert and oriented. No focal neurological deficits. Extremities: Symmetric 5 x 5 power. Skin: No rashes, lesions or ulcers Psychiatry: Judgement and insight appear normal. Mood & affect appropriate.   Data Reviewed: I have personally reviewed following labs and imaging studies  CBC: Recent Labs  Lab 01/21/20 0838 01/22/20 0430 01/23/20 0421  WBC 15.4* 10.1 10.5  NEUTROABS  --   --  9.2*  HGB 12.4* 11.5* 10.7*  HCT 38.0* 36.3* 33.3*  MCV 96.2 97.1 96.2  PLT 180 127* 147*    Basic Metabolic Panel: Recent Labs  Lab 01/21/20 0838 01/21/20 2057 01/22/20 0430 01/23/20 0421  NA 132*  --  137 138  K 3.9  --  4.2 3.4*  CL 95*  --  103 108  CO2 25  --  21* 23  GLUCOSE 117*  --  90 143*  BUN 36*  --  30* 21  CREATININE 1.96* 1.50* 1.38* 1.33*  CALCIUM 9.1  --  8.6* 8.3*  MG  --   --   --  1.7    GFR: Estimated Creatinine Clearance: 41.3 mL/min (A) (by C-G formula based on SCr of 1.33 mg/dL (H)).  Liver Function Tests: Recent Labs  Lab 01/21/20 0838 01/22/20 0430 01/23/20 0421  AST 75* 153* 65*  ALT 74* 141* 93*  ALKPHOS 77 75 68  BILITOT 0.9 0.8 0.7  PROT 6.2* 5.7* 5.1*  ALBUMIN 2.7* 2.3* 2.1*    CBG: No results for input(s): GLUCAP in the last 168  hours.  Recent Results (from the past 240 hour(s))  Culture, blood (single) w Reflex to ID Panel     Status: None (Preliminary result)   Collection Time: 01/21/20  9:04 AM   Specimen: BLOOD  Result Value Ref Range Status   Specimen Description   Final    BLOOD RIGHT ANTECUBITAL Performed at [facility], [address], [place], [place] [address]    Special Requests   Final  BOTTLES DRAWN AEROBIC AND ANAEROBIC Blood Culture adequate volume Performed at [facility], [address] [place], [place] [address]    Culture  Setup Time   Final    GRAM POSITIVE COCCI ANAEROBIC BOTTLE ONLY Gram Stain Report Called to,Read Back By and Verified With: SHELTON @ 6045 ON [id number] BY HENDERSON L. Organism ID to follow Performed at [facility], [place] [address], [place], [place] [address]    Culture GRAM POSITIVE COCCI  Final   Report Status PENDING  Incomplete  Blood Culture ID Panel (Reflexed)     Status: Abnormal   Collection Time: 01/21/20  9:04 AM  Result Value Ref Range Status   Enterococcus species NOT DETECTED NOT DETECTED Final   Listeria monocytogenes NOT DETECTED NOT DETECTED Final   Staphylococcus species NOT DETECTED NOT DETECTED Final   Staphylococcus aureus (BCID) NOT DETECTED NOT DETECTED Final   Streptococcus species DETECTED (A) NOT DETECTED Final    Comment: Not Enterococcus species, Streptococcus agalactiae, Streptococcus pyogenes, or Streptococcus pneumoniae. CRITICAL RESULT CALLED TO, READ BACK BY AND VERIFIED WITH: Gloris Manchester PharmD 15:45 01/22/20 ([email or web address])    Streptococcus agalactiae NOT DETECTED NOT DETECTED Final   Streptococcus pneumoniae NOT DETECTED NOT DETECTED Final   Streptococcus pyogenes NOT DETECTED NOT DETECTED Final   Acinetobacter baumannii NOT DETECTED NOT DETECTED Final   Enterobacteriaceae species NOT DETECTED NOT DETECTED Final   Enterobacter cloacae complex NOT DETECTED NOT DETECTED Final   Escherichia coli NOT DETECTED NOT DETECTED Final    Klebsiella oxytoca NOT DETECTED NOT DETECTED Final   Klebsiella pneumoniae NOT DETECTED NOT DETECTED Final   Proteus species NOT DETECTED NOT DETECTED Final   Serratia marcescens NOT DETECTED NOT DETECTED Final   Haemophilus influenzae NOT DETECTED NOT DETECTED Final   Neisseria meningitidis NOT DETECTED NOT DETECTED Final   Pseudomonas aeruginosa NOT DETECTED NOT DETECTED Final   Candida albicans NOT DETECTED NOT DETECTED Final   Candida glabrata NOT DETECTED NOT DETECTED Final   Candida krusei NOT DETECTED NOT DETECTED Final   Candida parapsilosis NOT DETECTED NOT DETECTED Final   Candida tropicalis NOT DETECTED NOT DETECTED Final    Comment: Performed at [facility], [place], [place], [place] [address]  Urine Culture     Status: None   Collection Time: 01/21/20 10:38 AM   Specimen: Urine, Clean Catch  Result Value Ref Range Status   Specimen Description   Final    URINE, CLEAN CATCH Performed at [facility], [address], [place], [place] [address]    Special Requests   Final    Normal Performed at [facility], [address], [place], [place] [address]    Culture   Final    NO GROWTH Performed at [facility], [place] [address] [place], [place] [address]    Report Status 01/23/2020 FINAL  Final  SARS Coronavirus 2 by RT PCR (hospital order, performed in [facility] hospital lab) Nasopharyngeal Nasopharyngeal Swab     Status: None   Collection Time: 01/21/20  3:14 PM   Specimen: Nasopharyngeal Swab  Result Value Ref Range Status   SARS Coronavirus 2 NEGATIVE NEGATIVE Final    Comment: (NOTE) SARS-CoV-2 target nucleic acids are NOT DETECTED.  The SARS-CoV-2 RNA is generally detectable in upper and lower respiratory specimens during the acute phase of infection. The lowest concentration of SARS-CoV-2 viral copies this assay can detect is 250 copies / mL. A negative result does not preclude SARS-CoV-2 infection and should  not be used as the sole basis for  treatment or other patient management decisions.  A negative result may occur with improper specimen collection / handling, submission of specimen other than nasopharyngeal swab, presence of viral mutation(s) within the areas targeted by this assay, and inadequate number of viral copies (<250 copies / mL). A negative result must be combined with clinical observations, patient history, and epidemiological information.  Fact Sheet for Patients:   StrictlyIdeas.no  Fact Sheet for Healthcare Providers: BankingDealers.co.za  This test is not yet approved or  cleared by the [place] FDA and has been authorized for detection and/or diagnosis of SARS-CoV-2 by FDA under an Emergency Use Authorization (EUA).  This EUA will remain in effect (meaning this test can be used) for the duration of the COVID-19 declaration under Section 564(b)(1) of the Act, 21 U.S.C. section 360bbb-3(b)(1), unless the authorization is terminated or revoked sooner.  Performed at [facility], [address], [place], [place] [address]   MRSA PCR Screening     Status: None   Collection Time: 01/22/20  3:31 AM   Specimen: Nasal Mucosa; Nasopharyngeal  Result Value Ref Range Status   MRSA by PCR NEGATIVE NEGATIVE Final    Comment:        The GeneXpert MRSA Assay (FDA approved for NASAL specimens only), is one component of a comprehensive MRSA colonization surveillance program. It is not intended to diagnose MRSA infection nor to guide or monitor treatment for MRSA infections. Performed at [facility], [address] [place], [place] [address]      Radiology Studies: MR 3D Recon At Scanner  Result Date: 01/21/2020 CLINICAL DATA:  Evaluate liver and pancreas lesions EXAM: MRI ABDOMEN WITHOUT AND WITH CONTRAST (INCLUDING MRCP) TECHNIQUE: Multiplanar multisequence MR imaging of the abdomen was performed both before and after the administration of intravenous  contrast. Heavily T2-weighted images of the biliary and pancreatic ducts were obtained, and three-dimensional MRCP images were rendered by post processing. CONTRAST:  23mL GADAVIST GADOBUTROL 1 MMOL/ML IV SOLN COMPARISON:  Same day CT abdomen pelvis, 01/21/2020, 04/08/2016, 10/29/2007 FINDINGS: Examination is generally limited by extensive breath motion artifact throughout. Lower chest: Trace pleural effusions. Hepatobiliary: There is a subcapsular mass in the inferior tip of the right lobe of the liver, hepatic segment VI, measuring 3.3 x 2.5 cm, with slight heterogeneous internal T2 hyperintensity, diffusion restriction, and demonstrating very gradual progressive peripheral nodular enhancement (series 19, image 49). An additional lesion in the posterior right lobe of the liver, hepatic segment VI, measures 0.8 cm and is too small to characterize although likely a subcentimeter cyst or hemangioma. Multiple small gallstones in the dependent gallbladder. Pancreas: There is a lobulated fluid signal, nonenhancing lesion of the pancreatic head immediately adjacent to the central common bile duct measuring 1.5 x 1.4 cm (series 22, image 25, series 4, image 22). No pancreatic ductal dilatation. Spleen:  Within normal limits in size and appearance. Adrenals/Urinary Tract: No masses identified. No evidence of hydronephrosis. Stomach/Bowel: Visualized portions within the abdomen are unremarkable. Vascular/Lymphatic: No pathologically enlarged lymph nodes identified. No abdominal aortic aneurysm demonstrated. Other:  None. Musculoskeletal: No suspicious bone lesions identified. IMPRESSION: 1. Examination is limited by extensive breath motion artifact throughout. 2. There is a subcapsular mass in the inferior tip of the right lobe of the liver, hepatic segment VI, measuring 3.3 x 2.5 cm, with very gradual progressive peripheral nodular enhancement. This mass is concerning for a peripheral cholangiocarcinoma, particularly given  that it is completely new compared  to prior CT dated 03/08/2016. 3. An additional lesion in the posterior right lobe of the liver, hepatic segment VI, measures 0.8 cm and is too small to characterize although statistically likely a subcentimeter cyst or hemangioma. 4. There is a lobulated fluid signal, nonenhancing lesion of the pancreatic head immediately adjacent to the central common bile duct measuring 1.5 cm. 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 No further follow-up is required. This recommendation follows ACR consensus guidelines: Management of Incidental Pancreatic Cysts: A White Paper of the ACR Incidental Findings Committee. [place] 6283;[id number]. Electronically Signed   By: Eddie Candle M.D.   On: 01/21/2020 13:57   MR ABDOMEN MRCP W WO CONTAST  Result Date: 01/21/2020 CLINICAL DATA:  Evaluate liver and pancreas lesions EXAM: MRI ABDOMEN WITHOUT AND WITH CONTRAST (INCLUDING MRCP) TECHNIQUE: Multiplanar multisequence MR imaging of the abdomen was performed both before and after the administration of intravenous contrast. Heavily T2-weighted images of the biliary and pancreatic ducts were obtained, and three-dimensional MRCP images were rendered by post processing. CONTRAST:  76mL GADAVIST GADOBUTROL 1 MMOL/ML IV SOLN COMPARISON:  Same day CT abdomen pelvis, 01/21/2020, 04/08/2016, 10/29/2007 FINDINGS: Examination is generally limited by extensive breath motion artifact throughout. Lower chest: Trace pleural effusions. Hepatobiliary: There is a subcapsular mass in the inferior tip of the right lobe of the liver, hepatic segment VI, measuring 3.3 x 2.5 cm, with slight heterogeneous internal T2 hyperintensity, diffusion restriction, and demonstrating very gradual progressive peripheral nodular enhancement (series 19, image 49). An additional lesion in the posterior right lobe of the  liver, hepatic segment VI, measures 0.8 cm and is too small to characterize although likely a subcentimeter cyst or hemangioma. Multiple small gallstones in the dependent gallbladder. Pancreas: There is a lobulated fluid signal, nonenhancing lesion of the pancreatic head immediately adjacent to the central common bile duct measuring 1.5 x 1.4 cm (series 22, image 25, series 4, image 22). No pancreatic ductal dilatation. Spleen:  Within normal limits in size and appearance. Adrenals/Urinary Tract: No masses identified. No evidence of hydronephrosis. Stomach/Bowel: Visualized portions within the abdomen are unremarkable. Vascular/Lymphatic: No pathologically enlarged lymph nodes identified. No abdominal aortic aneurysm demonstrated. Other:  None. Musculoskeletal: No suspicious bone lesions identified. IMPRESSION: 1. Examination is limited by extensive breath motion artifact throughout. 2. There is a subcapsular mass in the inferior tip of the right lobe of the liver, hepatic segment VI, measuring 3.3 x 2.5 cm, with very gradual progressive peripheral nodular enhancement. 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 3. An additional lesion in the posterior right lobe of the liver, hepatic segment VI, measures 0.8 cm and is too small to characterize although statistically likely a subcentimeter cyst or hemangioma. 4. There is a lobulated fluid signal, nonenhancing lesion of the pancreatic head immediately adjacent to the central common bile duct measuring 1.5 cm. 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 No further follow-up is required. This recommendation follows ACR consensus guidelines: Management of Incidental Pancreatic Cysts: A White Paper of the ACR Incidental Findings Committee. [place]  4650;[id number]. Electronically Signed   By: Eddie Candle M.D.   On: 01/21/2020 13:57   Scheduled Meds: . abiraterone acetate  1,000 mg Oral Daily  .  aspirin EC  81 mg Oral Daily  . Chlorhexidine Gluconate Cloth  6 each Topical Daily  . heparin injection (subcutaneous)  5,000 Units Subcutaneous Q8H  . methocarbamol  750 mg Oral TID  . metoprolol tartrate  25 mg Oral QID  . multivitamin with minerals  1 tablet Oral Daily  . predniSONE  5 mg Oral BID WC  . rosuvastatin  40 mg Oral QPC supper  . sodium chloride flush  3 mL Intravenous Q12H   Continuous Infusions: . sodium chloride 125 mL/hr at 01/23/20 0955  . sodium chloride Stopped (01/21/20 2312)  . sodium chloride    . ceFEPime (MAXIPIME) IV 2 g (01/23/20 1008)  . fluconazole (DIFLUCAN) IV 100 mg (01/23/20 1006)  . metronidazole Stopped (01/23/20 0656)  . vancomycin Stopped (01/22/20 2319)    LOS: 2 days   Critical Care Procedure Note Authorized and Performed by: Murvin Natal MD  Total Critical Care time:  32 mins  Due to a high probability of clinically significant, life threatening deterioration, the patient required my highest level of preparedness to intervene emergently and I personally spent this critical care time directly and personally managing the patient.  This critical care time included obtaining a history; examining the patient, pulse oximetry; ordering and review of studies; arranging urgent treatment with development of a management plan; evaluation of patient's response of treatment; frequent reassessment; and discussions with other providers.  This critical care time was performed to assess and manage the high probability of imminent and life threatening deterioration that could result in multi-organ failure.  It was exclusive of separately billable procedures and treating other patients and teaching time.   Irwin Brakeman, MD How to contact the [facility] Attending or Consulting provider [place] or covering provider during  after hours [place], for this patient?  1. Check the care team in [facility] and look for a) attending/consulting [facility] provider listed and b) the [facility] team listed 2. Log into [email or web address] and use [place]'s universal password to access. If you do not have the password, please contact the hospital operator. 3. Locate the [facility] provider you are looking for under [facility] and page to a number that you can be directly reached. 4. If you still have difficulty reaching the provider, please page the [facility] (Director on Call) for the Hospitalists listed on amion for assistance.  01/23/2020, 10:44 AM

## 2020-01-23 NOTE — Progress Notes (Signed)
At 1300 patients HR noted to be sustaining in the 160's. Patient expressed some slight pain on the left side of his chest but, otherwise not symptomatic. Patient also stated he had a headache. Patient stated he could not feel his HR racing. PRN IV Cardizem administered promptly. No change to HR after IV cardizem administered. Bernerd Pho, [place] notified and order placed for IV metoprolol 5 mg. Instructed to wait 15-20 minutes after cardizem dose before administering metoprolol. Metoprolol administered at 1329 with immediate improvement. HR down to 95. Patient stated both head and chest pain has decreased. Tylenol for headache administered

## 2020-01-24 DIAGNOSIS — M5441 Lumbago with sciatica, right side: Secondary | ICD-10-CM

## 2020-01-24 DIAGNOSIS — Z515 Encounter for palliative care: Secondary | ICD-10-CM

## 2020-01-24 DIAGNOSIS — M5442 Lumbago with sciatica, left side: Secondary | ICD-10-CM

## 2020-01-24 DIAGNOSIS — N179 Acute kidney failure, unspecified: Secondary | ICD-10-CM

## 2020-01-24 LAB — COMPREHENSIVE METABOLIC PANEL
ALT: 76 U/L — ABNORMAL HIGH (ref 0–44)
AST: 43 U/L — ABNORMAL HIGH (ref 15–41)
Albumin: 2.2 g/dL — ABNORMAL LOW (ref 3.5–5.0)
Alkaline Phosphatase: 79 U/L (ref 38–126)
Anion gap: 9 (ref 5–15)
BUN: 22 mg/dL (ref 8–23)
CO2: 22 mmol/L (ref 22–32)
Calcium: 8.2 mg/dL — ABNORMAL LOW (ref 8.9–10.3)
Chloride: 105 mmol/L (ref 98–111)
Creatinine, Ser: 1.42 mg/dL — ABNORMAL HIGH (ref 0.61–1.24)
GFR calc Af Amer: 54 mL/min — ABNORMAL LOW (ref >60)
GFR calc non Af Amer: 47 mL/min — ABNORMAL LOW (ref >60)
Glucose, Bld: 113 mg/dL — ABNORMAL HIGH (ref 70–99)
Potassium: 3.7 mmol/L (ref 3.5–5.1)
Sodium: 136 mmol/L (ref 135–145)
Total Bilirubin: 0.4 mg/dL (ref 0.3–1.2)
Total Protein: 5.4 g/dL — ABNORMAL LOW (ref 6.5–8.1)

## 2020-01-24 LAB — CBC WITH DIFFERENTIAL/PLATELET
Abs Immature Granulocytes: 0.03 10*3/uL (ref 0.00–0.07)
Basophils Absolute: 0 10*3/uL (ref 0.0–0.1)
Basophils Relative: 0 %
Eosinophils Absolute: 0 10*3/uL (ref 0.0–0.5)
Eosinophils Relative: 0 %
HCT: 33.1 % — ABNORMAL LOW (ref 39.0–52.0)
Hemoglobin: 10.8 g/dL — ABNORMAL LOW (ref 13.0–17.0)
Immature Granulocytes: 0 %
Lymphocytes Relative: 10 %
Lymphs Abs: 0.9 10*3/uL (ref 0.7–4.0)
MCH: 31.4 pg (ref 26.0–34.0)
MCHC: 32.6 g/dL (ref 30.0–36.0)
MCV: 96.2 fL (ref 80.0–100.0)
Monocytes Absolute: 0.6 10*3/uL (ref 0.1–1.0)
Monocytes Relative: 7 %
Neutro Abs: 7.2 10*3/uL (ref 1.7–7.7)
Neutrophils Relative %: 83 %
Platelets: 154 10*3/uL (ref 150–400)
RBC: 3.44 MIL/uL — ABNORMAL LOW (ref 4.22–5.81)
RDW: 14.5 % (ref 11.5–15.5)
WBC: 8.7 10*3/uL (ref 4.0–10.5)
nRBC: 0 % (ref 0.0–0.2)

## 2020-01-24 LAB — SEDIMENTATION RATE: Sed Rate: 98 mm/hr — ABNORMAL HIGH (ref 0–16)

## 2020-01-24 LAB — MAGNESIUM: Magnesium: 2.3 mg/dL (ref 1.7–2.4)

## 2020-01-24 NOTE — Care Management Important Message (Signed)
Important Message  Patient Details  Name: Samuel Gregory MRN: [id number] Date of Birth: July 22, 1941   Medicare Important Message Given:  Yes     Tommy Medal 01/24/2020, 3:36 PM

## 2020-01-24 NOTE — TOC Initial Note (Signed)
Transition of Care [facility]) - Initial/Assessment Note    Patient Details  Name: Samuel Gregory MRN: [id number] Date of Birth: Jan 25, 1941  Transition [facility] Care [facility]) CM/SW Contact:    Salome Arnt, [place] Phone Number: 01/24/2020, 2:49 PM  Clinical Narrative:  Pt admitted with sepsis. LCSW spoke with pt who reports he lives at home with his wife. He uses rolling walker at baseline. PT evaluated pt today and recommend HHPT. LCSW discussed with pt and provided choices. Referred to [facility] with [place] who accepts. Pt indicates he may need wheelchair depending on how he feels at d/c. Will continue to follow.                 Expected Discharge Plan: [place] Barriers to Discharge: Continued Medical Work up   Patient Goals and CMS Choice Patient states their goals for this hospitalization and ongoing recovery are:: return home   Choice offered to / list presented to : Patient  Expected Discharge Plan and Services Expected Discharge Plan: [place] In-house Referral: Clinical Social Work   Post Acute Care Choice: [place] arrangements for the past 2 months: [place]: PT [place]: [place] ([place]) Date [place]: 01/24/20 Time [place]: [place] Representative spoke with at [place]: Zada Finders  Prior Living Arrangements/Services Living arrangements for the past 2 months: [place] with:: Spouse Patient language and need for interpreter reviewed:: Yes Do you feel safe going back to the place where you live?: Yes      Need for Family Participation in Patient Care: No (Comment) Care giver support system in place?: Yes (comment) Current home services: DME (rolling walker) Criminal Activity/Legal Involvement Pertinent to Current Situation/Hospitalization: No - Comment as needed  Activities of Daily Living Home Assistive  Devices/Equipment: Walker (specify type) ADL Screening (condition at time of admission) Patient's cognitive ability adequate to safely complete daily activities?: Yes Is the patient deaf or have difficulty hearing?: No Does the patient have difficulty seeing, even when wearing glasses/contacts?: No Does the patient have difficulty concentrating, remembering, or making decisions?: No Patient able to express need for assistance with ADLs?: Yes Does the patient have difficulty dressing or bathing?: No Independently performs ADLs?: Yes (appropriate for developmental age) Does the patient have difficulty walking or climbing stairs?: Yes Weakness of Legs: Both Weakness of Arms/Hands: None  Permission Sought/Granted   Permission granted to share information with : Yes, Verbal Permission Granted     Permission granted to share info w AGENCY: [facility]        Emotional Assessment Appearance:: Appears stated age Attitude/Demeanor/Rapport: Engaged Affect (typically observed): Accepting Orientation: : Oriented to Self, Oriented to Place, Oriented to  Time, Oriented to Situation Alcohol / Substance Use: Not Applicable Psych Involvement: No (comment)  Admission diagnosis:  Pain [R52] Liver mass [R16.0] AKI (acute kidney injury) ([place]) [N17.9] Hypotension [I95.9] Hypotension, unspecified hypotension type [I95.9] Acute midline low back pain with bilateral sciatica [M54.42, M54.41] Patient Active Problem List   Diagnosis Date Noted  . Acute midline low back pain with bilateral sciatica   . AKI (acute kidney injury) ([place])   . Atrial flutter with rapid ventricular response ([place]) 01/23/2020  . Goals of care, counseling/discussion   . Palliative care by  specialist   . DNR (do not resuscitate) discussion   . Hypotension 01/21/2020  . Abdominal pain-Pancreatic and liver masses 01/21/2020  . Severe sepsis with septic shock ([place]) 01/21/2020  . Nocturia 11/20/2019  . Bone metastases  ([place]) 12/25/2018  . Male hypogonadism 12/25/2018  . Prostate cancer --with bony metastases, status post prior prostatectomy in 2004 12/25/2018  . Fusion of spine of lumbosacral region 12/19/2017  . Essential hypertension 01/23/2013  . Coronary atherosclerosis of native coronary artery 01/23/2013  . Mixed hyperlipidemia 01/23/2013   PCP:  Celene Squibb, MD Pharmacy:   [facility], [place] [address] [place] [address] Phone: [phone] Fax: [phone]     Social Determinants of Health (SDOH) Interventions    Readmission Risk Interventions No flowsheet data found.

## 2020-01-24 NOTE — Progress Notes (Signed)
Progress Note  Patient Name: Samuel Gregory Date of Encounter: 01/24/2020  Primary Cardiologist: Rozann Lesches, MD  Subjective   No active abdominal pain no chest pain, no shortness of breath at rest.  No nausea or emesis.  Inpatient Medications    Scheduled Meds: . abiraterone acetate  1,000 mg Oral Daily  . aspirin EC  81 mg Oral Daily  . Chlorhexidine Gluconate Cloth  6 each Topical Daily  . heparin injection (subcutaneous)  5,000 Units Subcutaneous Q8H  . methocarbamol  750 mg Oral TID  . metoprolol tartrate  25 mg Oral QID  . multivitamin with minerals  1 tablet Oral Daily  . predniSONE  5 mg Oral BID WC  . rosuvastatin  40 mg Oral QPC supper  . sodium chloride flush  3 mL Intravenous Q12H   Continuous Infusions: . sodium chloride 40 mL/hr at 01/24/20 0704  . sodium chloride Stopped (01/21/20 2312)  . sodium chloride    . cefTRIAXone (ROCEPHIN)  IV Stopped (01/23/20 1226)  . fluconazole (DIFLUCAN) IV 100 mg (01/24/20 0931)   PRN Meds: sodium chloride, acetaminophen **OR** acetaminophen, albuterol, diltiazem, fluticasone, metoprolol tartrate, nitroGLYCERIN, ondansetron **OR** ondansetron (ZOFRAN) IV, polyethylene glycol, sodium chloride flush, traZODone   Vital Signs    Vitals:   01/24/20 0600 01/24/20 0700 01/24/20 0800 01/24/20 0922  BP: (!) 162/65 (!) 163/86 135/90 132/80  Pulse: (!) 59 67 67 80  Resp:      Temp:   98.2 F (36.8 C)   TempSrc:   Oral   SpO2: 98% 93% 96%   Weight:      Height:        Intake/Output Summary (Last 24 hours) at 01/24/2020 0942 Last data filed at 01/24/2020 0924 Gross per 24 hour  Intake 1420.1 ml  Output 1600 ml  Net -179.9 ml   Filed Weights   01/22/20 0500 01/23/20 0500 01/24/20 0500  Weight: 73.2 kg 73.5 kg 73.5 kg    Telemetry    Initially sinus rhythm converting to atrial flutter with RVR this morning during rounds.  Personally reviewed.  ECG    An ECG dated 01/21/2020 was personally reviewed today and  demonstrated:  Sinus rhythm with low voltage.  Physical Exam   GEN:  Elderly male.  No acute distress.   Neck: No JVD. Cardiac: Rapid RRR, soft systolic murmur, no gallop.  Respiratory: Nonlabored. Clear to auscultation bilaterally. GI: Soft, bowel sounds present. MS: No edema; No deformity. Neuro:  Nonfocal. Psych: Alert and oriented x 3. Normal affect.  Labs    Chemistry Recent Labs  Lab 01/22/20 0430 01/23/20 0421 01/24/20 0416  NA 137 138 136  K 4.2 3.4* 3.7  CL 103 108 105  CO2 21* 23 22  GLUCOSE 90 143* 113*  BUN 30* 21 22  CREATININE 1.38* 1.33* 1.42*  CALCIUM 8.6* 8.3* 8.2*  PROT 5.7* 5.1* 5.4*  ALBUMIN 2.3* 2.1* 2.2*  AST 153* 65* 43*  ALT 141* 93* 76*  ALKPHOS 75 68 79  BILITOT 0.8 0.7 0.4  GFRNONAA 49* 51* 47*  GFRAA 56* 59* 54*  ANIONGAP 13 7 9      Hematology Recent Labs  Lab 01/22/20 0430 01/23/20 0421 01/24/20 0416  WBC 10.1 10.5 8.7  RBC 3.74* 3.46* 3.44*  HGB 11.5* 10.7* 10.8*  HCT 36.3* 33.3* 33.1*  MCV 97.1 96.2 96.2  MCH 30.7 30.9 31.4  MCHC 31.7 32.1 32.6  RDW 14.0 14.0 14.5  PLT 127* 147* 154    Radiology  DG CHEST PORT 1 VIEW  Result Date: 01/23/2020 CLINICAL DATA:  New onset wheezing and shortness of breath EXAM: PORTABLE CHEST 1 VIEW COMPARISON:  10/26/2016 FINDINGS: Post median sternotomy for CABG. Stable heart size. Hilar structures are normal. Lungs are clear.  Question small RIGHT effusion. Visualized skeletal structures on limited assessment are unremarkable. IMPRESSION: Question small RIGHT effusion.  No sign of consolidation. Electronically Signed   By: Zetta Bills M.D.   On: 01/23/2020 12:47   ECHOCARDIOGRAM COMPLETE  Result Date: 01/23/2020    ECHOCARDIOGRAM REPORT   Patient Name:   DEAGLAN LILE Date of Exam: 01/23/2020 Medical Rec #:  [id number]    Height:       66.0 in Accession #:    [id number]   Weight:       162.0 lb Date of Birth:  09-25-1940    BSA:          1.829 m Patient Age:    24 years     BP:            128/92 mmHg Patient Gender: M            HR:           97 bpm. Exam Location:  [facility] Procedure: 2D Echo Indications:    Bacteremia 790.7 / R78.81  History:        Patient has prior history of Echocardiogram examinations, most                 recent 04/12/2011. Arrythmias:Atrial Fibrillation and Atrial                 Flutter; Risk Factors:Hypertension, Dyslipidemia and Non-Smoker.                 Prostate cancer.  Sonographer:    Leavy Cella RDCS (AE) Referring Phys: [place]  1. Left ventricular ejection fraction, by estimation, is 60 to 65%. The left ventricle has normal function. The left ventricle has no regional wall motion abnormalities. There is mild left ventricular hypertrophy. Left ventricular diastolic parameters are consistent with Grade I diastolic dysfunction (impaired relaxation).  2. Right ventricular systolic function is normal. The right ventricular size is normal. There is mildly elevated pulmonary artery systolic pressure.  3. The mitral valve is normal in structure. No evidence of mitral valve regurgitation. No evidence of mitral stenosis.  4. The aortic valve is tricuspid. Aortic valve regurgitation is not visualized. No aortic stenosis is present.  5. The inferior vena cava is normal in size with greater than 50% respiratory variability, suggesting right atrial pressure of 3 mmHg. FINDINGS  Left Ventricle: Left ventricular ejection fraction, by estimation, is 60 to 65%. The left ventricle has normal function. The left ventricle has no regional wall motion abnormalities. The left ventricular internal cavity size was normal in size. There is  mild left ventricular hypertrophy. Left ventricular diastolic parameters are consistent with Grade I diastolic dysfunction (impaired relaxation). Normal left ventricular filling pressure. Right Ventricle: The right ventricular size is normal. No increase in right ventricular wall thickness. Right ventricular systolic  function is normal. There is mildly elevated pulmonary artery systolic pressure. The tricuspid regurgitant velocity is 2.81  m/s, and with an assumed right atrial pressure of 10 mmHg, the estimated right ventricular systolic pressure is 40.8 mmHg. Left Atrium: Left atrial size was normal in size. Right Atrium: Right atrial size was normal in size. Pericardium: There is no evidence of pericardial effusion. Mitral  Valve: The mitral valve is normal in structure. No evidence of mitral valve regurgitation. No evidence of mitral valve stenosis. Tricuspid Valve: The tricuspid valve is normal in structure. Tricuspid valve regurgitation is mild . No evidence of tricuspid stenosis. Aortic Valve: The aortic valve is tricuspid. Aortic valve regurgitation is not visualized. No aortic stenosis is present. Aortic valve mean gradient measures 3.1 mmHg. Aortic valve peak gradient measures 6.2 mmHg. Aortic valve area, by VTI measures 2.29 cm. Pulmonic Valve: The pulmonic valve was not well visualized. Pulmonic valve regurgitation is not visualized. No evidence of pulmonic stenosis. Aorta: The aortic root is normal in size and structure. Pulmonary Artery: Mild pulmonary HTN, PASP is 35 mmHg. Venous: The inferior vena cava is normal in size with greater than 50% respiratory variability, suggesting right atrial pressure of 3 mmHg. IAS/Shunts: No atrial level shunt detected by color flow Doppler.  LEFT VENTRICLE PLAX 2D LVIDd:         3.76 cm  Diastology LVIDs:         2.57 cm  LV e' lateral:   10.10 cm/s LV PW:         1.10 cm  LV E/e' lateral: 6.5 LV IVS:        1.12 cm  LV e' medial:    7.51 cm/s LVOT diam:     1.80 cm  LV E/e' medial:  8.7 LV SV:         45 LV SV Index:   25 LVOT Area:     2.54 cm  RIGHT VENTRICLE RV S prime:     14.60 cm/s TAPSE (M-mode): 1.5 cm LEFT ATRIUM           Index       RIGHT ATRIUM          Index LA diam:      3.20 cm 1.75 cm/m  RA Area:     9.39 cm LA Vol (A2C): 28.4 ml 15.53 ml/m RA Volume:   20.50  ml 11.21 ml/m LA Vol (A4C): 34.5 ml 18.87 ml/m  AORTIC VALVE AV Area (Vmax):    2.10 cm AV Area (Vmean):   2.08 cm AV Area (VTI):     2.29 cm AV Vmax:           124.23 cm/s AV Vmean:          83.099 cm/s AV VTI:            0.196 m AV Peak Grad:      6.2 mmHg AV Mean Grad:      3.1 mmHg LVOT Vmax:         102.35 cm/s LVOT Vmean:        68.044 cm/s LVOT VTI:          0.176 m LVOT/AV VTI ratio: 0.90  AORTA Ao Root diam: 3.30 cm MITRAL VALVE               TRICUSPID VALVE MV Area (PHT): 3.68 cm    TR Peak grad:   31.6 mmHg MV Decel Time: 206 msec    TR Vmax:        281.00 cm/s MV E velocity: 65.50 cm/s MV A velocity: 77.10 cm/s  SHUNTS MV E/A ratio:  0.85        Systemic VTI:  0.18 m                            Systemic Diam:  1.80 cm Carlyle Dolly MD Electronically signed by Carlyle Dolly MD Signature Date/Time: 01/23/2020/11:50:45 AM    Final     Patient Profile     79 y.o. male with past medical history of CAD (s/p CABG in 2000, NST in 10/2017 showing mild to moderate per-infarct ischemia with medical management recommended), HTN, HLD and prostate cancer (with bone mets) who is being seen for paroxysmal atrial flutter with RVR.   Assessment & Plan    1.  Paroxysmal atrial flutter with RVR, CHA2DS2-VASc score is 4.  Arrhythmia is newly documented in the setting of sepsis and liver lesion.  Anticoagulation is not being pursued at this time in light of ongoing clinical work-up and uncertainty of final diagnosis.  He is also anemic and has had transient thrombocytopenia.  He is currently on Lopressor 25 mg p.o. every 6 hours, did have a burst of breakthrough rapid atrial flutter this morning on rounds but returned to sinus rhythm in the 70s and has otherwise been hemodynamically stable.  Echocardiogram reveals LVEF 60 to 65%, normal RV contraction, no significant valvular disease or clear evidence of vegetations.  2.  CAD status post CABG in 2000.  No active angina at this time on medical therapy.   Ischemic testing from 2019 revealed mild to moderate peri-infarct ischemia that was managed medically.  He is currently on aspirin, Lopressor, and Crestor.  3.  Right upper lobe hepatic lesion.  Question of neoplasm versus abscess per review of Dr. Olevia Perches documentation.  Further imaging and biopsy planned.  Patient had Streptococcus intermedius by blood culture and is currently on ceftriaxone.  Presently afebrile.  I reviewed the chart, cardiology consultation note from yesterday per Dr. Harl Bowie, and also discussed the situation with patient and his family.  I was preparing to give IV Lopressor this morning when he spontaneously converted back to sinus rhythm.  For now would continue Lopressor at present dose and use IV Lopressor as needed for breakthrough arrhythmia as this was effective yesterday.  Further consideration could be given to initiation of IV amiodarone if rhythm control becomes difficult (his LFTs are coming down at baseline, but this would need to be followed closely). Chestine Spore, MD  01/24/2020, 9:42 AM

## 2020-01-24 NOTE — Progress Notes (Signed)
Subjective:  Patient feels much better.  He was able to ambulate on the floor without getting dizzy or lightheaded.  His appetite is coming back.  He denies abdominal pain melena or rectal bleeding or diarrhea.  Current Medications:  Current Facility-Administered Medications:  .  0.9 %  sodium chloride infusion, , Intravenous, Continuous, Murlean Iba, MD, Stopped at 01/24/20 0931 .  0.9 %  sodium chloride infusion, 250 mL, Intravenous, Continuous, Roxan Hockey, MD, Held at 01/21/20 2312 .  0.9 %  sodium chloride infusion, 250 mL, Intravenous, PRN, Emokpae, Courage, MD .  abiraterone acetate (ZYTIGA) tablet 1,000 mg, 1,000 mg, Oral, Daily, Emokpae, Courage, MD, 1,000 mg at 01/24/20 0923 .  acetaminophen (TYLENOL) tablet 650 mg, 650 mg, Oral, Q6H PRN, 650 mg at 01/23/20 1341 **OR** acetaminophen (TYLENOL) suppository 650 mg, 650 mg, Rectal, Q6H PRN, Emokpae, Courage, MD .  albuterol (PROVENTIL) (2.5 MG/3ML) 0.083% nebulizer solution 2.5 mg, 2.5 mg, Nebulization, Q2H PRN, Emokpae, Courage, MD, 2.5 mg at 01/23/20 1148 .  aspirin EC tablet 81 mg, 81 mg, Oral, Daily, Emokpae, Courage, MD, 81 mg at 01/24/20 0923 .  cefTRIAXone (ROCEPHIN) 2 g in sodium chloride 0.9 % 100 mL IVPB, 2 g, Intravenous, Q24H, Johnson, Clanford L, MD, Last Rate: 200 mL/hr at 01/24/20 1202, 2 g at 01/24/20 1202 .  Chlorhexidine Gluconate Cloth 2 % PADS 6 each, 6 each, Topical, Daily, Reubin Milan, MD, 6 each at 01/24/20 [phone] .  diltiazem (CARDIZEM) injection 10 mg, 10 mg, Intravenous, Q4H PRN, Johnson, Clanford L, MD, 10 mg at 01/23/20 1302 .  fluticasone (FLONASE) 50 MCG/ACT nasal spray 1 spray, 1 spray, Each Nare, Daily PRN, Emokpae, Courage, MD .  heparin injection 5,000 Units, 5,000 Units, Subcutaneous, Q8H, Emokpae, Courage, MD, 5,000 Units at 01/24/20 0640 .  methocarbamol (ROBAXIN) tablet 750 mg, 750 mg, Oral, TID, Emokpae, Courage, MD, 750 mg at 01/24/20 0922 .  metoprolol tartrate (LOPRESSOR)  injection 5 mg, 5 mg, Intravenous, Q6H PRN, Ahmed Prima, Brittany M, PA-C, 5 mg at 01/24/20 0958 .  metoprolol tartrate (LOPRESSOR) tablet 25 mg, 25 mg, Oral, QID, Arnoldo Lenis, MD, 25 mg at 01/24/20 9758 .  multivitamin with minerals tablet 1 tablet, 1 tablet, Oral, Daily, Emokpae, Courage, MD, 1 tablet at 01/24/20 0923 .  nitroGLYCERIN (NITROSTAT) SL tablet 0.4 mg, 0.4 mg, Sublingual, Q5 Min x 3 PRN, Emokpae, Courage, MD .  ondansetron (ZOFRAN) tablet 4 mg, 4 mg, Oral, Q6H PRN **OR** ondansetron (ZOFRAN) injection 4 mg, 4 mg, Intravenous, Q6H PRN, Emokpae, Courage, MD .  polyethylene glycol (MIRALAX / GLYCOLAX) packet 17 g, 17 g, Oral, Daily PRN, Emokpae, Courage, MD .  predniSONE (DELTASONE) tablet 5 mg, 5 mg, Oral, BID WC, Johnson, Clanford L, MD, 5 mg at 01/24/20 0923 .  rosuvastatin (CRESTOR) tablet 40 mg, 40 mg, Oral, QPC supper, Emokpae, Courage, MD, 40 mg at 01/23/20 1707 .  sodium chloride flush (NS) 0.9 % injection 3 mL, 3 mL, Intravenous, Q12H, Emokpae, Courage, MD, 3 mL at 01/24/20 0924 .  sodium chloride flush (NS) 0.9 % injection 3 mL, 3 mL, Intravenous, PRN, Emokpae, Courage, MD .  traZODone (DESYREL) tablet 50 mg, 50 mg, Oral, QHS PRN, Emokpae, Courage, MD   Objective: Blood pressure 131/79, pulse 62, temperature 97.8 F (36.6 C), temperature source Oral, resp. rate (!) 21, height 5' 6"  (1.676 m), weight 73.5 kg, SpO2 96 %.  Patient appears to be comfortable sitting in a chair and eating lunch. Cardiac exam with regular rhythm normal  S1-S2.  No murmur gallop noted. Auscultation lungs reveal vesicular breath sounds bilaterally. Abdomen is full but soft and nontender with organomegaly or masses.  Labs/studies Results:  CBC Latest Ref Rng & Units 01/24/2020 01/23/2020 01/22/2020  WBC 4.0 - 10.5 K/uL 8.7 10.5 10.1  Hemoglobin 13.0 - 17.0 g/dL 10.8(L) 10.7(L) 11.5(L)  Hematocrit 39 - 52 % 33.1(L) 33.3(L) 36.3(L)  Platelets 150 - 400 K/uL 154 147(L) 127(L)    CMP Latest Ref  Rng & Units 01/24/2020 01/23/2020 01/22/2020  Glucose 70 - 99 mg/dL 113(H) 143(H) 90  BUN 8 - 23 mg/dL 22 21 30(H)  Creatinine 0.61 - 1.24 mg/dL 1.42(H) 1.33(H) 1.38(H)  Sodium 135 - 145 mmol/L 136 138 137  Potassium 3.5 - 5.1 mmol/L 3.7 3.4(L) 4.2  Chloride 98 - 111 mmol/L 105 108 103  CO2 22 - 32 mmol/L 22 23 21(L)  Calcium 8.9 - 10.3 mg/dL 8.2(L) 8.3(L) 8.6(L)  Total Protein 6.5 - 8.1 g/dL 5.4(L) 5.1(L) 5.7(L)  Total Bilirubin 0.3 - 1.2 mg/dL 0.4 0.7 0.8  Alkaline Phos 38 - 126 U/L 79 68 75  AST 15 - 41 U/L 43(H) 65(H) 153(H)  ALT 0 - 44 U/L 76(H) 93(H) 141(H)    Hepatic Function Latest Ref Rng & Units 01/24/2020 01/23/2020 01/22/2020  Total Protein 6.5 - 8.1 g/dL 5.4(L) 5.1(L) 5.7(L)  Albumin 3.5 - 5.0 g/dL 2.2(L) 2.1(L) 2.3(L)  AST 15 - 41 U/L 43(H) 65(H) 153(H)  ALT 0 - 44 U/L 76(H) 93(H) 141(H)  Alk Phosphatase 38 - 126 U/L 79 68 75  Total Bilirubin 0.3 - 1.2 mg/dL 0.4 0.7 0.8    Sed rate 98.  Assessment:  #1.  Sepsis.  Blood cultures positive for strep species.  Patient had elevated transaminases on admission.  He does have cholelithiasis.  MRI was negative for choledocholithiasis.  Patient has no other source of infection therefore I believe that we are dealing with cholangitis and he passed stone spontaneously.  Cholangitis can be caused by gram-positive organisms although gram-negative are more common.  Should continue antibiotic for 5 to 7 days.  #2.  Liver lesion to be further evaluated.  Concern is that it is neoplastic process.  I have requested Dr. Mayer Camel input on CT findings.  #3.  Anemia.  H&H is stable.  No evidence of GI bleed.  Suspect anemia secondary to acute illness.  #4.  Paroxysmal atrial flutter with rapid ventricular rate.  Dr. Myles Gip note appreciated.  As he does not have chronic liver disease.  No contraindication to use amiodarone.  Will monitor LFTs closely.  If patient has to be on an anticoagulant I do not see any contraindication.  If and when  decision is made to proceed with liver biopsy we will just be interrupted.  #5.  Elevated sed rate could be due to sepsis and/or neoplasm.  We will plan to repeat in in few weeks.  #6.  Metastatic prostate carcinoma maintained on on androgenic therapy.  Recommendations  We will arrange for liver biopsy on an outpatient basis within 2 weeks or so.

## 2020-01-24 NOTE — Plan of Care (Signed)
°  Problem: Acute Rehab PT Goals(only PT should resolve) Goal: Pt Will Go Supine/Side To Sit Outcome: Progressing Flowsheets (Taken 01/24/2020 1417) Pt will go Supine/Side to Sit: with supervision Goal: Patient Will Transfer Sit To/From Stand Outcome: Progressing Flowsheets (Taken 01/24/2020 1417) Patient will transfer sit to/from stand: with supervision Goal: Pt Will Transfer Bed To Chair/Chair To Bed Outcome: Progressing Flowsheets (Taken 01/24/2020 1417) Pt will Transfer Bed to Chair/Chair to Bed: with supervision Goal: Pt Will Ambulate Outcome: Progressing Flowsheets (Taken 01/24/2020 1417) Pt will Ambulate:  75 feet  with supervision  with min guard assist  with rolling walker   2:17 PM, 01/24/20 Lonell Grandchild, MPT Physical Therapist with [facility] [phone] office [phone] mobile phone

## 2020-01-24 NOTE — Progress Notes (Addendum)
PROGRESS NOTE   [name]  YIR:[id number] DOB: February 26, 1941 DOA: 01/21/2020 PCP: Celene Squibb, MD   Chief Complaint  Patient presents with  . Back Pain   Brief Admission History:  79 year old gentleman with collagen vascular disease, CAD, state cancer that is metastatic, hypertension presented to the emergency department with severe back pain and admitted with severe sepsis.  Assessment & Plan:   Principal Problem:   Severe sepsis with septic shock [facility]) Active Problems:   Essential hypertension   Coronary atherosclerosis of native coronary artery   Prostate cancer --with bony metastases, status post prior prostatectomy in 2004   Hypotension   Abdominal pain-Pancreatic and liver masses   Goals of care, counseling/discussion   Palliative care by specialist   DNR (do not resuscitate) discussion   Atrial flutter with rapid ventricular response ([place])  1. Severe sepsis from strep species / strep bacteremia-deescalate to ceftriaxone 2 gm IV daily to continue for 5 more days.  Continue supportive care.  Suspect source was cholangitis.  Ambulate with PT.  2. Elevated LFTs-Question if he passed a stone, Enzymes are coming down, appreciate gastroenterology consultation and recommendations.  Follow LFTs. 3. Atrial flutter with RVR - rate has been intermittently uncontrolled, metoprolol restarted, appreciate cardiology consult and recommendations. I spoke with cardiology and GI, ok to use IV amiodarone (no bolus) for rate control if needed. I spoke with GI and anticoagulation is OK, will counsel with patient about starting apixaban and if agreeable will start tomorrow.  4. AKI on presumed stage 3b CKD - mproved with IV fluid hydration. 5. Leukocytosis-WBC trending down to normal with treatments. 6. Hypotension-markedly improved and has been weaned off the Levophed infusion. 7. Metastatic prostate cancer with bone metastases-outpatient follow up.  DVT prophylaxis: Heparin/SCDs Code Status:  Full Family Communication: Spouse and daughter at bedside Disposition:   Status is: Inpatient  Remains inpatient appropriate because:Hemodynamically unstable, Persistent severe electrolyte disturbances and IV treatments appropriate due to intensity of illness or inability to take PO  AFib/Flutter with RVR remains uncontrolled and likely will need IV amiodarone infusion if oral meds are not working.    Dispo: The patient is from: Home              Anticipated d/c is to: TBD              Anticipated d/c date is: 3 days              Patient currently is not medically stable to d/c.  Consultants:   GI  Palliative   Procedures:   Central line placement  Antimicrobials:  Anti-infectives (From admission, onward)   Start     Dose/Rate Route Frequency Ordered Stop   01/23/20 1200  cefTRIAXone (ROCEPHIN) 2 g in sodium chloride 0.9 % 100 mL IVPB     Discontinue     2 g 200 mL/hr over 30 Minutes Intravenous Every 24 hours 01/23/20 1051     01/22/20 2200  vancomycin (VANCOCIN) IVPB 1000 mg/200 mL premix  Status:  Discontinued        1,000 mg 200 mL/hr over 60 Minutes Intravenous Every 24 hours 01/21/20 2203 01/23/20 1055   01/22/20 0930  ceFEPIme (MAXIPIME) 2 g in sodium chloride 0.9 % 100 mL IVPB  Status:  Discontinued        2 g 200 mL/hr over 30 Minutes Intravenous Every 12 hours 01/21/20 2202 01/23/20 1051   01/22/20 0900  fluconazole (DIFLUCAN) IVPB 100 mg  100 mg 50 mL/hr over 60 Minutes Intravenous Every 24 hours 01/22/20 0829 01/24/20 1031   01/21/20 2200  metroNIDAZOLE (FLAGYL) IVPB 500 mg  Status:  Discontinued        500 mg 100 mL/hr over 60 Minutes Intravenous Every 8 hours 01/21/20 2113 01/23/20 1051   01/21/20 2115  vancomycin (VANCOREADY) IVPB 1500 mg/300 mL        1,500 mg 150 mL/hr over 120 Minutes Intravenous  Once 01/21/20 2101 01/22/20 0315   01/21/20 2115  ceFEPIme (MAXIPIME) 2 g in sodium chloride 0.9 % 100 mL IVPB  Status:  Discontinued        2 g 200  mL/hr over 30 Minutes Intravenous Every 24 hours 01/21/20 2101 01/21/20 2202   01/21/20 1715  cefTRIAXone (ROCEPHIN) 2 g in sodium chloride 0.9 % 100 mL IVPB  Status:  Discontinued        2 g 200 mL/hr over 30 Minutes Intravenous Every 24 hours 01/21/20 1702 01/21/20 2026      Subjective: Patient hungry and wants to eat more solid foods. He had some intermittent palpitations.   Objective: Vitals:   01/24/20 1005 01/24/20 1030 01/24/20 1100 01/24/20 1200  BP:  (!) 131/98 131/79   Pulse: (!) 133 (!) 58 62   Resp: (!) 21 (!) 22 (!) 21   Temp:    (!) 97.5 F (36.4 C)  TempSrc:    Oral  SpO2: 96% 96% 96%   Weight:      Height:        Intake/Output Summary (Last 24 hours) at 01/24/2020 1416 Last data filed at 01/24/2020 0951 Gross per 24 hour  Intake 1010.86 ml  Output 1600 ml  Net -589.14 ml   Filed Weights   01/22/20 0500 01/23/20 0500 01/24/20 0500  Weight: 73.2 kg 73.5 kg 73.5 kg   Examination:  General exam: Appears chronically ill and acutely ill.  No apparent distress.  Speaking in full sentences. Respiratory system:  Respiratory effort normal. Cardiovascular system: S1 & S2 heard, irregularly irregular. No JVD, murmurs, rubs, gallops or clicks. No pedal edema. Gastrointestinal system: Abdomen is nondistended, soft and nontender. No organomegaly or masses felt. Normal bowel sounds heard. Central nervous system: Alert and oriented. No focal neurological deficits. Extremities: Symmetric 5 x 5 power. Skin: No rashes, lesions or ulcers Psychiatry: Judgement and insight appear normal. Mood & affect appropriate.   Data Reviewed: I have personally reviewed following labs and imaging studies  CBC: Recent Labs  Lab 01/21/20 0838 01/22/20 0430 01/23/20 0421 01/24/20 0416  WBC 15.4* 10.1 10.5 8.7  NEUTROABS  --   --  9.2* 7.2  HGB 12.4* 11.5* 10.7* 10.8*  HCT 38.0* 36.3* 33.3* 33.1*  MCV 96.2 97.1 96.2 96.2  PLT 180 127* 147* 448    Basic Metabolic Panel: Recent  Labs  Lab 01/21/20 0838 01/21/20 2057 01/22/20 0430 01/23/20 0421 01/24/20 0416  NA 132*  --  137 138 136  K 3.9  --  4.2 3.4* 3.7  CL 95*  --  103 108 105  CO2 25  --  21* 23 22  GLUCOSE 117*  --  90 143* 113*  BUN 36*  --  30* 21 22  CREATININE 1.96* 1.50* 1.38* 1.33* 1.42*  CALCIUM 9.1  --  8.6* 8.3* 8.2*  MG  --   --   --  1.7 2.3    GFR: Estimated Creatinine Clearance: 38.7 mL/min (A) (by C-G formula based on SCr of 1.42 mg/dL (H)).  Liver Function Tests: Recent Labs  Lab 01/21/20 0838 01/22/20 0430 01/23/20 0421 01/24/20 0416  AST 75* 153* 65* 43*  ALT 74* 141* 93* 76*  ALKPHOS 77 75 68 79  BILITOT 0.9 0.8 0.7 0.4  PROT 6.2* 5.7* 5.1* 5.4*  ALBUMIN 2.7* 2.3* 2.1* 2.2*    CBG: No results for input(s): GLUCAP in the last 168 hours.  Recent Results (from the past 240 hour(s))  Culture, blood (single) w Reflex to ID Panel     Status: Abnormal (Preliminary result)   Collection Time: 01/21/20  9:04 AM   Specimen: BLOOD  Result Value Ref Range Status   Specimen Description   Final    BLOOD RIGHT ANTECUBITAL Performed at [facility], [address], [place], [place] [address]    Special Requests   Final    BOTTLES DRAWN AEROBIC AND ANAEROBIC Blood Culture adequate volume Performed at [facility], [address] [place], [place] [address]    Culture  Setup Time   Final    GRAM POSITIVE COCCI ANAEROBIC BOTTLE ONLY Gram Stain Report Called to,Read Back By and Verified With: SHELTON @ 4431 ON [id number] BY HENDERSON L. AEROBIC BOTTLE ALSO Performed at [facility], [address], [place], [place] [address]    Culture (A)  Final    STREPTOCOCCUS INTERMEDIUS SUSCEPTIBILITIES TO FOLLOW Performed at [facility], [place] [address] [place], [place] [address]    Report Status PENDING  Incomplete  Blood Culture ID Panel (Reflexed)     Status: Abnormal   Collection Time: 01/21/20  9:04 AM  Result Value Ref Range Status   Enterococcus species NOT DETECTED NOT  DETECTED Final   Listeria monocytogenes NOT DETECTED NOT DETECTED Final   Staphylococcus species NOT DETECTED NOT DETECTED Final   Staphylococcus aureus (BCID) NOT DETECTED NOT DETECTED Final   Streptococcus species DETECTED (A) NOT DETECTED Final    Comment: Not Enterococcus species, Streptococcus agalactiae, Streptococcus pyogenes, or Streptococcus pneumoniae. CRITICAL RESULT CALLED TO, READ BACK BY AND VERIFIED WITH: Gloris Manchester PharmD 15:45 01/22/20 ([email or web address])    Streptococcus agalactiae NOT DETECTED NOT DETECTED Final   Streptococcus pneumoniae NOT DETECTED NOT DETECTED Final   Streptococcus pyogenes NOT DETECTED NOT DETECTED Final   Acinetobacter baumannii NOT DETECTED NOT DETECTED Final   Enterobacteriaceae species NOT DETECTED NOT DETECTED Final   Enterobacter cloacae complex NOT DETECTED NOT DETECTED Final   Escherichia coli NOT DETECTED NOT DETECTED Final   Klebsiella oxytoca NOT DETECTED NOT DETECTED Final   Klebsiella pneumoniae NOT DETECTED NOT DETECTED Final   Proteus species NOT DETECTED NOT DETECTED Final   Serratia marcescens NOT DETECTED NOT DETECTED Final   Haemophilus influenzae NOT DETECTED NOT DETECTED Final   Neisseria meningitidis NOT DETECTED NOT DETECTED Final   Pseudomonas aeruginosa NOT DETECTED NOT DETECTED Final   Candida albicans NOT DETECTED NOT DETECTED Final   Candida glabrata NOT DETECTED NOT DETECTED Final   Candida krusei NOT DETECTED NOT DETECTED Final   Candida parapsilosis NOT DETECTED NOT DETECTED Final   Candida tropicalis NOT DETECTED NOT DETECTED Final    Comment: Performed at [facility], [place]. [address] [place], [place] [address]  Urine Culture     Status: None   Collection Time: 01/21/20 10:38 AM   Specimen: Urine, Clean Catch  Result Value Ref Range Status   Specimen Description   Final    URINE, CLEAN CATCH Performed at [facility], [address], [place], [place] [address]    Special Requests  Final    Normal Performed at  [facility], [address] [place] [address], [place], [place] [address]    Culture   Final    NO GROWTH Performed at [facility], [place] [address] [place], [place] [address]    Report Status 01/23/2020 FINAL  Final  SARS Coronavirus 2 by RT PCR (hospital order, performed in [facility] hospital lab) Nasopharyngeal Nasopharyngeal Swab     Status: None   Collection Time: 01/21/20  3:14 PM   Specimen: Nasopharyngeal Swab  Result Value Ref Range Status   SARS Coronavirus 2 NEGATIVE NEGATIVE Final    Comment: (NOTE) SARS-CoV-2 target nucleic acids are NOT DETECTED.  The SARS-CoV-2 RNA is generally detectable in upper and lower respiratory specimens during the acute phase of infection. The lowest concentration of SARS-CoV-2 viral copies this assay can detect is 250 copies / mL. A negative result does not preclude SARS-CoV-2 infection and should not be used as the sole basis for treatment or other patient management decisions.  A negative result may occur with improper specimen collection / handling, submission of specimen other than nasopharyngeal swab, presence of viral mutation(s) within the areas targeted by this assay, and inadequate number of viral copies (<250 copies / mL). A negative result must be combined with clinical observations, patient history, and epidemiological information.  Fact Sheet for Patients:   StrictlyIdeas.no  Fact Sheet for Healthcare Providers: BankingDealers.co.za  This test is not yet approved or  cleared by the [place] FDA and has been authorized for detection and/or diagnosis of SARS-CoV-2 by FDA under an Emergency Use Authorization (EUA).  This EUA will remain in effect (meaning this test can be used) for the duration of the COVID-19 declaration under Section 564(b)(1) of the Act, 21 U.S.C. section 360bbb-3(b)(1), unless the authorization is terminated or revoked sooner.  Performed at [facility],  [address], [place], [place] [address]   MRSA PCR Screening     Status: None   Collection Time: 01/22/20  3:31 AM   Specimen: Nasal Mucosa; Nasopharyngeal  Result Value Ref Range Status   MRSA by PCR NEGATIVE NEGATIVE Final    Comment:        The GeneXpert MRSA Assay (FDA approved for NASAL specimens only), is one component of a comprehensive MRSA colonization surveillance program. It is not intended to diagnose MRSA infection nor to guide or monitor treatment for MRSA infections. Performed at [facility], [address] [place], [place] [address]      Radiology Studies: DG CHEST PORT 1 VIEW  Result Date: 01/23/2020 CLINICAL DATA:  New onset wheezing and shortness of breath EXAM: PORTABLE CHEST 1 VIEW COMPARISON:  10/26/2016 FINDINGS: Post median sternotomy for CABG. Stable heart size. Hilar structures are normal. Lungs are clear.  Question small RIGHT effusion. Visualized skeletal structures on limited assessment are unremarkable. IMPRESSION: Question small RIGHT effusion.  No sign of consolidation. Electronically Signed   By: Zetta Bills M.D.   On: 01/23/2020 12:47   ECHOCARDIOGRAM COMPLETE  Result Date: 01/23/2020    ECHOCARDIOGRAM REPORT   Patient Name:   Samuel Gregory Date of Exam: 01/23/2020 Medical Rec #:  [id number]    Height:       66.0 in Accession #:    [id number]   Weight:       162.0 lb Date of Birth:  06/11/1941    BSA:          1.829 m Patient Age:    50 years  BP:           128/92 mmHg Patient Gender: M            HR:           97 bpm. Exam Location:  [facility] Procedure: 2D Echo Indications:    Bacteremia 790.7 / R78.81  History:        Patient has prior history of Echocardiogram examinations, most                 recent 04/12/2011. Arrythmias:Atrial Fibrillation and Atrial                 Flutter; Risk Factors:Hypertension, Dyslipidemia and Non-Smoker.                 Prostate cancer.  Sonographer:    Leavy Cella RDCS (AE) Referring Phys: [place]  1. Left ventricular ejection fraction, by estimation, is 60 to 65%. The left ventricle has normal function. The left ventricle has no regional wall motion abnormalities. There is mild left ventricular hypertrophy. Left ventricular diastolic parameters are consistent with Grade I diastolic dysfunction (impaired relaxation).  2. Right ventricular systolic function is normal. The right ventricular size is normal. There is mildly elevated pulmonary artery systolic pressure.  3. The mitral valve is normal in structure. No evidence of mitral valve regurgitation. No evidence of mitral stenosis.  4. The aortic valve is tricuspid. Aortic valve regurgitation is not visualized. No aortic stenosis is present.  5. The inferior vena cava is normal in size with greater than 50% respiratory variability, suggesting right atrial pressure of 3 mmHg. FINDINGS  Left Ventricle: Left ventricular ejection fraction, by estimation, is 60 to 65%. The left ventricle has normal function. The left ventricle has no regional wall motion abnormalities. The left ventricular internal cavity size was normal in size. There is  mild left ventricular hypertrophy. Left ventricular diastolic parameters are consistent with Grade I diastolic dysfunction (impaired relaxation). Normal left ventricular filling pressure. Right Ventricle: The right ventricular size is normal. No increase in right ventricular wall thickness. Right ventricular systolic function is normal. There is mildly elevated pulmonary artery systolic pressure. The tricuspid regurgitant velocity is 2.81  m/s, and with an assumed right atrial pressure of 10 mmHg, the estimated right ventricular systolic pressure is 03.4 mmHg. Left Atrium: Left atrial size was normal in size. Right Atrium: Right atrial size was normal in size. Pericardium: There is no evidence of pericardial effusion. Mitral Valve: The mitral valve is normal in structure. No evidence of mitral valve regurgitation. No  evidence of mitral valve stenosis. Tricuspid Valve: The tricuspid valve is normal in structure. Tricuspid valve regurgitation is mild . No evidence of tricuspid stenosis. Aortic Valve: The aortic valve is tricuspid. Aortic valve regurgitation is not visualized. No aortic stenosis is present. Aortic valve mean gradient measures 3.1 mmHg. Aortic valve peak gradient measures 6.2 mmHg. Aortic valve area, by VTI measures 2.29 cm. Pulmonic Valve: The pulmonic valve was not well visualized. Pulmonic valve regurgitation is not visualized. No evidence of pulmonic stenosis. Aorta: The aortic root is normal in size and structure. Pulmonary Artery: Mild pulmonary HTN, PASP is 35 mmHg. Venous: The inferior vena cava is normal in size with greater than 50% respiratory variability, suggesting right atrial pressure of 3 mmHg. IAS/Shunts: No atrial level shunt detected by color flow Doppler.  LEFT VENTRICLE PLAX 2D LVIDd:         3.76 cm  Diastology LVIDs:  2.57 cm  LV e' lateral:   10.10 cm/s LV PW:         1.10 cm  LV E/e' lateral: 6.5 LV IVS:        1.12 cm  LV e' medial:    7.51 cm/s LVOT diam:     1.80 cm  LV E/e' medial:  8.7 LV SV:         45 LV SV Index:   25 LVOT Area:     2.54 cm  RIGHT VENTRICLE RV S prime:     14.60 cm/s TAPSE (M-mode): 1.5 cm LEFT ATRIUM           Index       RIGHT ATRIUM          Index LA diam:      3.20 cm 1.75 cm/m  RA Area:     9.39 cm LA Vol (A2C): 28.4 ml 15.53 ml/m RA Volume:   20.50 ml 11.21 ml/m LA Vol (A4C): 34.5 ml 18.87 ml/m  AORTIC VALVE AV Area (Vmax):    2.10 cm AV Area (Vmean):   2.08 cm AV Area (VTI):     2.29 cm AV Vmax:           124.23 cm/s AV Vmean:          83.099 cm/s AV VTI:            0.196 m AV Peak Grad:      6.2 mmHg AV Mean Grad:      3.1 mmHg LVOT Vmax:         102.35 cm/s LVOT Vmean:        68.044 cm/s LVOT VTI:          0.176 m LVOT/AV VTI ratio: 0.90  AORTA Ao Root diam: 3.30 cm MITRAL VALVE               TRICUSPID VALVE MV Area (PHT): 3.68 cm    TR  Peak grad:   31.6 mmHg MV Decel Time: 206 msec    TR Vmax:        281.00 cm/s MV E velocity: 65.50 cm/s MV A velocity: 77.10 cm/s  SHUNTS MV E/A ratio:  0.85        Systemic VTI:  0.18 m                            Systemic Diam: 1.80 cm Carlyle Dolly MD Electronically signed by Carlyle Dolly MD Signature Date/Time: 01/23/2020/11:50:45 AM    Final    Scheduled Meds: . abiraterone acetate  1,000 mg Oral Daily  . aspirin EC  81 mg Oral Daily  . Chlorhexidine Gluconate Cloth  6 each Topical Daily  . heparin injection (subcutaneous)  5,000 Units Subcutaneous Q8H  . methocarbamol  750 mg Oral TID  . metoprolol tartrate  25 mg Oral QID  . multivitamin with minerals  1 tablet Oral Daily  . predniSONE  5 mg Oral BID WC  . rosuvastatin  40 mg Oral QPC supper  . sodium chloride flush  3 mL Intravenous Q12H   Continuous Infusions: . sodium chloride Stopped (01/24/20 0931)  . sodium chloride Stopped (01/21/20 2312)  . sodium chloride    . cefTRIAXone (ROCEPHIN)  IV 2 g (01/24/20 1202)    LOS: 3 days   Critical Care Procedure Note Authorized and Performed by: Murvin Natal MD  Total Critical Care time:  31 mins  Due  to a high probability of clinically significant, life threatening deterioration, the patient required my highest level of preparedness to intervene emergently and I personally spent this critical care time directly and personally managing the patient.  This critical care time included obtaining a history; examining the patient, pulse oximetry; ordering and review of studies; arranging urgent treatment with development of a management plan; evaluation of patient's response of treatment; frequent reassessment; and discussions with other providers.  This critical care time was performed to assess and manage the high probability of imminent and life threatening deterioration that could result in multi-organ failure.  It was exclusive of separately billable procedures and treating other patients  and teaching time.   Irwin Brakeman, MD How to contact the [facility] Attending or Consulting provider [place] or covering provider during after hours [place], for this patient?  1. Check the care team in [facility] and look for a) attending/consulting [facility] provider listed and b) the [facility] team listed 2. Log into [email or web address] and use [place]'s universal password to access. If you do not have the password, please contact the hospital operator. 3. Locate the [facility] provider you are looking for under [facility] and page to a number that you can be directly reached. 4. If you still have difficulty reaching the provider, please page the [facility] (Director on Call) for the Hospitalists listed on amion for assistance.  01/24/2020, 2:16 PM

## 2020-01-24 NOTE — Evaluation (Signed)
Physical Therapy Evaluation Patient Details Name: Samuel Gregory MRN: [id number] DOB: 1941-07-18 Today's Date: 01/24/2020   History of Present Illness  Samuel Gregory  is a 79 y.o. male history of collagen vascular disease, CAD, hypertension, prostate cancer  and chronic back pain who presents with worsening back pain for the last couple of weeks    Clinical Impression  Patient functioning near baseline for functional mobility and gait.  Patient has most difficulty with sit to stands due to BLE weakness, able to transfer to commode for a BM, ambulated in hallway using RW without loss of balance, desaturated from 92% to 87% while walking, but SpO2 increased above 95% after sitting in chair.  Patient tolerated sitting up in chair after therapy - RN aware.  Patient will benefit from continued physical therapy in hospital and recommended venue below to increase strength, balance, endurance for safe ADLs and gait.      Follow Up Recommendations Home health PT;Supervision for mobility/OOB;Supervision - Intermittent    Equipment Recommendations  None recommended by PT    Recommendations for Other Services       Precautions / Restrictions Precautions Precautions: Fall Restrictions Weight Bearing Restrictions: No      Mobility  Bed Mobility Overal bed mobility: Needs Assistance Bed Mobility: Rolling;Sidelying to Sit Rolling: Supervision Sidelying to sit: Min guard;Min assist       General bed mobility comments: increased time, labored movement  Transfers Overall transfer level: Needs assistance Equipment used: Rolling walker (2 wheeled) Transfers: Sit to/from [facility] Sit to Stand: Min assist Stand pivot transfers: Min guard       General transfer comment: has diffiuclty with sit to stands due to BLE weakness  Ambulation/Gait Ambulation/Gait assistance: Min guard;Min assist Gait Distance (Feet): 45 Feet Assistive device: Rolling walker (2 wheeled) Gait  Pattern/deviations: Decreased step length - left;Decreased stance time - right;Decreased stride length Gait velocity: decreased   General Gait Details: slow labored cadence without loss of balance, limited secondary to fatigue, on room air with SpO2 dropping from 92% to 87%  Stairs            Wheelchair Mobility    Modified Rankin (Stroke Patients Only)       Balance Overall balance assessment: Needs assistance Sitting-balance support: Feet supported;No upper extremity supported Sitting balance-Leahy Scale: Good Sitting balance - Comments: seated at EOB   Standing balance support: During functional activity;Bilateral upper extremity supported Standing balance-Leahy Scale: Fair Standing balance comment: using RW                             Pertinent Vitals/Pain Pain Assessment: No/denies pain    Home Living Family/patient expects to be discharged to:: Private residence Living Arrangements: Spouse/significant other Available Help at Discharge: Family;Available 24 hours/day Type of Home: House Home Access: Stairs to enter Entrance Stairs-Rails: Left Entrance Stairs-Number of Steps: 2-3 Home Layout: One level Home Equipment: Bedside commode;Tub bench;Walker - 2 wheels;Cane - single point      Prior Function Level of Independence: Independent with assistive device(s)         Comments: household ambulator with RW     Hand Dominance        Extremity/Trunk Assessment   Upper Extremity Assessment Upper Extremity Assessment: Generalized weakness    Lower Extremity Assessment Lower Extremity Assessment: Generalized weakness    Cervical / Trunk Assessment Cervical / Trunk Assessment: Normal  Communication   Communication: No difficulties  Cognition  Arousal/Alertness: Awake/alert Behavior During Therapy: WFL for tasks assessed/performed Overall Cognitive Status: Within Functional Limits for tasks assessed                                         General Comments      Exercises     Assessment/Plan    PT Assessment Patient needs continued PT services  PT Problem List Decreased strength;Decreased activity tolerance;Decreased balance;Decreased mobility       PT Treatment Interventions Gait training;Functional mobility training;Therapeutic activities;Therapeutic exercise;Stair training;Patient/family education;Balance training    PT Goals (Current goals can be found in the Care Plan section)  Acute Rehab PT Goals Patient Stated Goal: return home with family to assist PT Goal Formulation: With patient/family Time For Goal Achievement: 01/29/20 Potential to Achieve Goals: Good    Frequency Min 3X/week   Barriers to discharge        Co-evaluation               AM-PAC PT "6 Clicks" Mobility  Outcome Measure Help needed turning from your back to your side while in a flat bed without using bedrails?: A Little Help needed moving from lying on your back to sitting on the side of a flat bed without using bedrails?: A Little Help needed moving to and from a bed to a chair (including a wheelchair)?: A Little Help needed standing up from a chair using your arms (e.g., wheelchair or bedside chair)?: A Little Help needed to walk in hospital room?: A Little Help needed climbing 3-5 steps with a railing? : A Lot 6 Click Score: 17    End of Session   Activity Tolerance: Patient tolerated treatment well;Patient limited by fatigue Patient left: in chair;with call bell/phone within reach Nurse Communication: Mobility status PT Visit Diagnosis: Unsteadiness on feet (R26.81);Other abnormalities of gait and mobility (R26.89);Muscle weakness (generalized) (M62.81)    Time: 7681-1572 PT Time Calculation (min) (ACUTE ONLY): 26 min   Charges:   PT Evaluation $PT Eval Moderate Complexity: 1 Mod PT Treatments $Therapeutic Activity: 23-37 mins        2:12 PM, 01/24/20 Lonell Grandchild, MPT Physical  Therapist with [facility] [phone] office [phone] mobile phone

## 2020-01-25 LAB — COMPREHENSIVE METABOLIC PANEL WITH GFR
ALT: 69 U/L — ABNORMAL HIGH (ref 0–44)
AST: 43 U/L — ABNORMAL HIGH (ref 15–41)
Albumin: 2.2 g/dL — ABNORMAL LOW (ref 3.5–5.0)
Alkaline Phosphatase: 78 U/L (ref 38–126)
Anion gap: 9 (ref 5–15)
BUN: 26 mg/dL — ABNORMAL HIGH (ref 8–23)
CO2: 22 mmol/L (ref 22–32)
Calcium: 8.8 mg/dL — ABNORMAL LOW (ref 8.9–10.3)
Chloride: 108 mmol/L (ref 98–111)
Creatinine, Ser: 1.36 mg/dL — ABNORMAL HIGH (ref 0.61–1.24)
GFR calc Af Amer: 57 mL/min — ABNORMAL LOW
GFR calc non Af Amer: 49 mL/min — ABNORMAL LOW
Glucose, Bld: 101 mg/dL — ABNORMAL HIGH (ref 70–99)
Potassium: 3.6 mmol/L (ref 3.5–5.1)
Sodium: 139 mmol/L (ref 135–145)
Total Bilirubin: 0.5 mg/dL (ref 0.3–1.2)
Total Protein: 5.3 g/dL — ABNORMAL LOW (ref 6.5–8.1)

## 2020-01-25 LAB — CBC WITH DIFFERENTIAL/PLATELET
Abs Immature Granulocytes: 0.02 K/uL (ref 0.00–0.07)
Basophils Absolute: 0 K/uL (ref 0.0–0.1)
Basophils Relative: 0 %
Eosinophils Absolute: 0 K/uL (ref 0.0–0.5)
Eosinophils Relative: 0 %
HCT: 34.5 % — ABNORMAL LOW (ref 39.0–52.0)
Hemoglobin: 11.2 g/dL — ABNORMAL LOW (ref 13.0–17.0)
Immature Granulocytes: 0 %
Lymphocytes Relative: 21 %
Lymphs Abs: 1.2 K/uL (ref 0.7–4.0)
MCH: 31.2 pg (ref 26.0–34.0)
MCHC: 32.5 g/dL (ref 30.0–36.0)
MCV: 96.1 fL (ref 80.0–100.0)
Monocytes Absolute: 0.5 K/uL (ref 0.1–1.0)
Monocytes Relative: 9 %
Neutro Abs: 4 K/uL (ref 1.7–7.7)
Neutrophils Relative %: 70 %
Platelets: 180 K/uL (ref 150–400)
RBC: 3.59 MIL/uL — ABNORMAL LOW (ref 4.22–5.81)
RDW: 14.4 % (ref 11.5–15.5)
WBC: 5.8 K/uL (ref 4.0–10.5)
nRBC: 0 % (ref 0.0–0.2)

## 2020-01-25 LAB — CULTURE, BLOOD (SINGLE): Special Requests: ADEQUATE

## 2020-01-25 MED ORDER — APIXABAN 5 MG PO TABS
5.0000 mg | ORAL_TABLET | Freq: Two times a day (BID) | ORAL | Status: DC
  Administered 2020-01-25 – 2020-01-27 (×5): 5 mg via ORAL
  Filled 2020-01-25 (×5): qty 1

## 2020-01-25 NOTE — Progress Notes (Signed)
PROGRESS NOTE   Samuel Gregory  IZT:[id number] DOB: March 27, 1941 DOA: 01/21/2020 PCP: Celene Squibb, MD   Chief Complaint  Patient presents with  . Back Pain   Brief Admission History:  79 year old gentleman with collagen vascular disease, CAD, state cancer that is metastatic, hypertension presented to the emergency department with severe back pain and admitted with severe sepsis.  Assessment & Plan:   Principal Problem:   Severe sepsis with septic shock [facility]) Active Problems:   Essential hypertension   Coronary atherosclerosis of native coronary artery   Prostate cancer --with bony metastases, status post prior prostatectomy in 2004   Hypotension   Abdominal pain-Pancreatic and liver masses   Goals of care, counseling/discussion   Palliative care by specialist   DNR (do not resuscitate) discussion   Atrial flutter with rapid ventricular response ([place])   Acute midline low back pain with bilateral sciatica   AKI (acute kidney injury) ([place])  1. Severe sepsis from strep species / strep bacteremia-Sepsis resolved now.   Continue ceftriaxone 2 gm IV daily to continue for 4 more days.  Continue supportive care.  Suspect source was cholangitis.  PT recommending HHPT.  2. Elevated LFTs-Question if he passed a stone, Enzymes are coming down, appreciate gastroenterology consultation and recommendations.  Outpatient liver biopsy planned by Dr. Laural Golden in next 2 weeks.  3. Atrial flutter with RVR - rate has been intermittently uncontrolled but better controlled overnight, metoprolol per cardiology orders, appreciate cardiology consult and recommendations. I spoke with cardiology and GI, ok to use IV amiodarone (no bolus) for rate control if needed. I spoke with GI and anticoagulation is OK, I counseled with patient about starting apixaban and he was agreeable to starting.  I asked pharm D to dose apixaban and counsel. Monitor.   4. AKI on presumed stage 3b CKD - mproved with IV fluid  hydration. 5. Leukocytosis-WBC trending down to normal with treatments. 6. Hypotension-Resolved.  BP markedly improved and has been weaned off the Levophed infusion. 7. Metastatic prostate cancer with bone metastases-stable on maintenance medications, outpatient follow up.  DVT prophylaxis: Heparin/SCDs Code Status: Full Family Communication: Spouse and daughter Disposition:   Status is: Inpatient  Remains inpatient appropriate because:Hemodynamically unstable, Persistent severe electrolyte disturbances and IV treatments appropriate due to intensity of illness or inability to take PO  AFib/Flutter with RVR remains uncontrolled and likely will need IV amiodarone infusion if oral meds are not working.    Dispo: The patient is from: Home              Anticipated d/c is to: TBD              Anticipated d/c date is: 2-3 days              Patient currently is not medically stable to d/c.  Consultants:   GI  Palliative   Procedures:   Central line placement  Antimicrobials:  Anti-infectives (From admission, onward)   Start     Dose/Rate Route Frequency Ordered Stop   01/23/20 1200  cefTRIAXone (ROCEPHIN) 2 g in sodium chloride 0.9 % 100 mL IVPB     Discontinue     2 g 200 mL/hr over 30 Minutes Intravenous Every 24 hours 01/23/20 1051     01/22/20 2200  vancomycin (VANCOCIN) IVPB 1000 mg/200 mL premix  Status:  Discontinued        1,000 mg 200 mL/hr over 60 Minutes Intravenous Every 24 hours 01/21/20 2203 01/23/20 1055   01/22/20  0930  ceFEPIme (MAXIPIME) 2 g in sodium chloride 0.9 % 100 mL IVPB  Status:  Discontinued        2 g 200 mL/hr over 30 Minutes Intravenous Every 12 hours 01/21/20 2202 01/23/20 1051   01/22/20 0900  fluconazole (DIFLUCAN) IVPB 100 mg        100 mg 50 mL/hr over 60 Minutes Intravenous Every 24 hours 01/22/20 0829 01/24/20 1033   01/21/20 2200  metroNIDAZOLE (FLAGYL) IVPB 500 mg  Status:  Discontinued        500 mg 100 mL/hr over 60 Minutes Intravenous  Every 8 hours 01/21/20 2113 01/23/20 1051   01/21/20 2115  vancomycin (VANCOREADY) IVPB 1500 mg/300 mL        1,500 mg 150 mL/hr over 120 Minutes Intravenous  Once 01/21/20 2101 01/22/20 0315   01/21/20 2115  ceFEPIme (MAXIPIME) 2 g in sodium chloride 0.9 % 100 mL IVPB  Status:  Discontinued        2 g 200 mL/hr over 30 Minutes Intravenous Every 24 hours 01/21/20 2101 01/21/20 2202   01/21/20 1715  cefTRIAXone (ROCEPHIN) 2 g in sodium chloride 0.9 % 100 mL IVPB  Status:  Discontinued        2 g 200 mL/hr over 30 Minutes Intravenous Every 24 hours 01/21/20 1702 01/21/20 2026     Subjective: Patient sitting up in chair, denies SOB, chest pain and palpitations.   Objective: Vitals:   01/25/20 0300 01/25/20 0353 01/25/20 0400 01/25/20 0800  BP: 116/72  (!) 145/69 (!) 163/89  Pulse: (!) 51  (!) 59 69  Resp: 14  17   Temp:  98 F (36.7 C)  97.6 F (36.4 C)  TempSrc:  Oral  Axillary  SpO2: 91%  96% 98%  Weight:  75.5 kg    Height:        Intake/Output Summary (Last 24 hours) at 01/25/2020 1057 Last data filed at 01/25/2020 0800 Gross per 24 hour  Intake 368.66 ml  Output 1925 ml  Net -1556.34 ml   Filed Weights   01/23/20 0500 01/24/20 0500 01/25/20 0353  Weight: 73.5 kg 73.5 kg 75.5 kg   Examination:  General exam: Appears chronically ill and acutely ill.  No apparent distress.  Speaking in full sentences. Respiratory system:  Respiratory effort normal. Cardiovascular system: S1 & S2 heard, irregularly irregular. No JVD, murmurs, rubs, gallops or clicks. No pedal edema. Gastrointestinal system: Abdomen is nondistended, soft and nontender. No organomegaly or masses felt. Normal bowel sounds heard. Central nervous system: Alert and oriented. No focal neurological deficits. Extremities: Symmetric 5 x 5 power. Skin: No rashes, lesions or ulcers Psychiatry: Judgement and insight appear normal. Mood & affect appropriate.   Data Reviewed: I have personally reviewed following labs  and imaging studies  CBC: Recent Labs  Lab 01/21/20 0838 01/22/20 0430 01/23/20 0421 01/24/20 0416 01/25/20 0402  WBC 15.4* 10.1 10.5 8.7 5.8  NEUTROABS  --   --  9.2* 7.2 4.0  HGB 12.4* 11.5* 10.7* 10.8* 11.2*  HCT 38.0* 36.3* 33.3* 33.1* 34.5*  MCV 96.2 97.1 96.2 96.2 96.1  PLT 180 127* 147* 154 938    Basic Metabolic Panel: Recent Labs  Lab 01/21/20 0838 01/21/20 0838 01/21/20 2057 01/22/20 0430 01/23/20 0421 01/24/20 0416 01/25/20 0402  NA 132*  --   --  137 138 136 139  K 3.9  --   --  4.2 3.4* 3.7 3.6  CL 95*  --   --  103 108  105 108  CO2 25  --   --  21* 23 22 22   GLUCOSE 117*  --   --  90 143* 113* 101*  BUN 36*  --   --  30* 21 22 26*  CREATININE 1.96*   < > 1.50* 1.38* 1.33* 1.42* 1.36*  CALCIUM 9.1  --   --  8.6* 8.3* 8.2* 8.8*  MG  --   --   --   --  1.7 2.3  --    < > = values in this interval not displayed.    GFR: Estimated Creatinine Clearance: 40.4 mL/min (A) (by C-G formula based on SCr of 1.36 mg/dL (H)).  Liver Function Tests: Recent Labs  Lab 01/21/20 0838 01/22/20 0430 01/23/20 0421 01/24/20 0416 01/25/20 0402  AST 75* 153* 65* 43* 43*  ALT 74* 141* 93* 76* 69*  ALKPHOS 77 75 68 79 78  BILITOT 0.9 0.8 0.7 0.4 0.5  PROT 6.2* 5.7* 5.1* 5.4* 5.3*  ALBUMIN 2.7* 2.3* 2.1* 2.2* 2.2*    CBG: No results for input(s): GLUCAP in the last 168 hours.  Recent Results (from the past 240 hour(s))  Culture, blood (single) w Reflex to ID Panel     Status: Abnormal   Collection Time: 01/21/20  9:04 AM   Specimen: BLOOD  Result Value Ref Range Status   Specimen Description   Final    BLOOD RIGHT ANTECUBITAL Performed at [facility], [address] [place], [place] [address]    Special Requests   Final    BOTTLES DRAWN AEROBIC AND ANAEROBIC Blood Culture adequate volume Performed at [facility], [address], [place], [place] [address]    Culture  Setup Time   Final    GRAM POSITIVE COCCI ANAEROBIC BOTTLE ONLY Gram Stain Report Called  to,Read Back By and Verified With: SHELTON @ [id number] ON [id number] BY HENDERSON L. AEROBIC BOTTLE ALSO Performed at [facility], [address] [place], [place] [address]    Culture STREPTOCOCCUS INTERMEDIUS (A)  Final   Report Status 01/25/2020 FINAL  Final   Organism ID, Bacteria STREPTOCOCCUS INTERMEDIUS  Final      Susceptibility   Streptococcus intermedius - MIC*    PENICILLIN <=0.06 SENSITIVE Sensitive     CEFTRIAXONE 0.25 SENSITIVE Sensitive     ERYTHROMYCIN 2 RESISTANT Resistant     LEVOFLOXACIN 0.5 SENSITIVE Sensitive     VANCOMYCIN 0.5 SENSITIVE Sensitive     * STREPTOCOCCUS INTERMEDIUS  Blood Culture ID Panel (Reflexed)     Status: Abnormal   Collection Time: 01/21/20  9:04 AM  Result Value Ref Range Status   Enterococcus species NOT DETECTED NOT DETECTED Final   Listeria monocytogenes NOT DETECTED NOT DETECTED Final   Staphylococcus species NOT DETECTED NOT DETECTED Final   Staphylococcus aureus (BCID) NOT DETECTED NOT DETECTED Final   Streptococcus species DETECTED (A) NOT DETECTED Final    Comment: Not Enterococcus species, Streptococcus agalactiae, Streptococcus pyogenes, or Streptococcus pneumoniae. CRITICAL RESULT CALLED TO, READ BACK BY AND VERIFIED WITH: Gloris Manchester PharmD 15:45 01/22/20 ([email or web address])    Streptococcus agalactiae NOT DETECTED NOT DETECTED Final   Streptococcus pneumoniae NOT DETECTED NOT DETECTED Final   Streptococcus pyogenes NOT DETECTED NOT DETECTED Final   Acinetobacter baumannii NOT DETECTED NOT DETECTED Final   Enterobacteriaceae species NOT DETECTED NOT DETECTED Final   Enterobacter cloacae complex NOT DETECTED NOT DETECTED Final   Escherichia coli NOT DETECTED NOT DETECTED Final   Klebsiella oxytoca NOT DETECTED NOT DETECTED Final  Klebsiella pneumoniae NOT DETECTED NOT DETECTED Final   Proteus species NOT DETECTED NOT DETECTED Final   Serratia marcescens NOT DETECTED NOT DETECTED Final   Haemophilus influenzae NOT DETECTED NOT DETECTED Final    Neisseria meningitidis NOT DETECTED NOT DETECTED Final   Pseudomonas aeruginosa NOT DETECTED NOT DETECTED Final   Candida albicans NOT DETECTED NOT DETECTED Final   Candida glabrata NOT DETECTED NOT DETECTED Final   Candida krusei NOT DETECTED NOT DETECTED Final   Candida parapsilosis NOT DETECTED NOT DETECTED Final   Candida tropicalis NOT DETECTED NOT DETECTED Final    Comment: Performed at [facility], [place] [address] [place], [place] [address]  Urine Culture     Status: None   Collection Time: 01/21/20 10:38 AM   Specimen: Urine, Clean Catch  Result Value Ref Range Status   Specimen Description   Final    URINE, CLEAN CATCH Performed at [facility], [address] [place], [place] [address]    Special Requests   Final    Normal Performed at [facility], [address] [place], [place] [address]    Culture   Final    NO GROWTH Performed at [facility], [place] [address], [place], [place] [address]    Report Status 01/23/2020 FINAL  Final  SARS Coronavirus 2 by RT PCR (hospital order, performed in [facility] hospital lab) Nasopharyngeal Nasopharyngeal Swab     Status: None   Collection Time: 01/21/20  3:14 PM   Specimen: Nasopharyngeal Swab  Result Value Ref Range Status   SARS Coronavirus 2 NEGATIVE NEGATIVE Final    Comment: (NOTE) SARS-CoV-2 target nucleic acids are NOT DETECTED.  The SARS-CoV-2 RNA is generally detectable in upper and lower respiratory specimens during the acute phase of infection. The lowest concentration of SARS-CoV-2 viral copies this assay can detect is 250 copies / mL. A negative result does not preclude SARS-CoV-2 infection and should not be used as the sole basis for treatment or other patient management decisions.  A negative result may occur with improper specimen collection / handling, submission of specimen other than nasopharyngeal swab, presence of viral mutation(s) within the areas targeted by this assay, and inadequate  number of viral copies (<250 copies / mL). A negative result must be combined with clinical observations, patient history, and epidemiological information.  Fact Sheet for Patients:   StrictlyIdeas.no  Fact Sheet for Healthcare Providers: BankingDealers.co.za  This test is not yet approved or  cleared by the [place] FDA and has been authorized for detection and/or diagnosis of SARS-CoV-2 by FDA under an Emergency Use Authorization (EUA).  This EUA will remain in effect (meaning this test can be used) for the duration of the COVID-19 declaration under Section 564(b)(1) of the Act, 21 U.S.C. section 360bbb-3(b)(1), unless the authorization is terminated or revoked sooner.  Performed at [facility], [address] [place], [place] [address]   MRSA PCR Screening     Status: None   Collection Time: 01/22/20  3:31 AM   Specimen: Nasal Mucosa; Nasopharyngeal  Result Value Ref Range Status   MRSA by PCR NEGATIVE NEGATIVE Final    Comment:        The GeneXpert MRSA Assay (FDA approved for NASAL specimens only), is one component of a comprehensive MRSA colonization surveillance program. It is not intended to diagnose MRSA infection nor to guide or monitor treatment for MRSA infections. Performed at [facility], [address] [place], [place] [address]  Radiology Studies: DG CHEST PORT 1 VIEW  Result Date: 01/23/2020 CLINICAL DATA:  New onset wheezing and shortness of breath EXAM: PORTABLE CHEST 1 VIEW COMPARISON:  10/26/2016 FINDINGS: Post median sternotomy for CABG. Stable heart size. Hilar structures are normal. Lungs are clear.  Question small RIGHT effusion. Visualized skeletal structures on limited assessment are unremarkable. IMPRESSION: Question small RIGHT effusion.  No sign of consolidation. Electronically Signed   By: Zetta Bills M.D.   On: 01/23/2020 12:47   ECHOCARDIOGRAM COMPLETE  Result Date: 01/23/2020     ECHOCARDIOGRAM REPORT   Patient Name:   Samuel Gregory Date of Exam: 01/23/2020 Medical Rec #:  [id number]    Height:       66.0 in Accession #:    [id number]   Weight:       162.0 lb Date of Birth:  1940/11/05    BSA:          1.829 m Patient Age:    40 years     BP:           128/92 mmHg Patient Gender: M            HR:           97 bpm. Exam Location:  [facility] Procedure: 2D Echo Indications:    Bacteremia 790.7 / R78.81  History:        Patient has prior history of Echocardiogram examinations, most                 recent 04/12/2011. Arrythmias:Atrial Fibrillation and Atrial                 Flutter; Risk Factors:Hypertension, Dyslipidemia and Non-Smoker.                 Prostate cancer.  Sonographer:    Leavy Cella RDCS (AE) Referring Phys: [place]  1. Left ventricular ejection fraction, by estimation, is 60 to 65%. The left ventricle has normal function. The left ventricle has no regional wall motion abnormalities. There is mild left ventricular hypertrophy. Left ventricular diastolic parameters are consistent with Grade I diastolic dysfunction (impaired relaxation).  2. Right ventricular systolic function is normal. The right ventricular size is normal. There is mildly elevated pulmonary artery systolic pressure.  3. The mitral valve is normal in structure. No evidence of mitral valve regurgitation. No evidence of mitral stenosis.  4. The aortic valve is tricuspid. Aortic valve regurgitation is not visualized. No aortic stenosis is present.  5. The inferior vena cava is normal in size with greater than 50% respiratory variability, suggesting right atrial pressure of 3 mmHg. FINDINGS  Left Ventricle: Left ventricular ejection fraction, by estimation, is 60 to 65%. The left ventricle has normal function. The left ventricle has no regional wall motion abnormalities. The left ventricular internal cavity size was normal in size. There is  mild left ventricular hypertrophy. Left  ventricular diastolic parameters are consistent with Grade I diastolic dysfunction (impaired relaxation). Normal left ventricular filling pressure. Right Ventricle: The right ventricular size is normal. No increase in right ventricular wall thickness. Right ventricular systolic function is normal. There is mildly elevated pulmonary artery systolic pressure. The tricuspid regurgitant velocity is 2.81  m/s, and with an assumed right atrial pressure of 10 mmHg, the estimated right ventricular systolic pressure is 63.8 mmHg. Left Atrium: Left atrial size was normal in size. Right Atrium: Right atrial size was normal in size. Pericardium: There is no evidence of pericardial  effusion. Mitral Valve: The mitral valve is normal in structure. No evidence of mitral valve regurgitation. No evidence of mitral valve stenosis. Tricuspid Valve: The tricuspid valve is normal in structure. Tricuspid valve regurgitation is mild . No evidence of tricuspid stenosis. Aortic Valve: The aortic valve is tricuspid. Aortic valve regurgitation is not visualized. No aortic stenosis is present. Aortic valve mean gradient measures 3.1 mmHg. Aortic valve peak gradient measures 6.2 mmHg. Aortic valve area, by VTI measures 2.29 cm. Pulmonic Valve: The pulmonic valve was not well visualized. Pulmonic valve regurgitation is not visualized. No evidence of pulmonic stenosis. Aorta: The aortic root is normal in size and structure. Pulmonary Artery: Mild pulmonary HTN, PASP is 35 mmHg. Venous: The inferior vena cava is normal in size with greater than 50% respiratory variability, suggesting right atrial pressure of 3 mmHg. IAS/Shunts: No atrial level shunt detected by color flow Doppler.  LEFT VENTRICLE PLAX 2D LVIDd:         3.76 cm  Diastology LVIDs:         2.57 cm  LV e' lateral:   10.10 cm/s LV PW:         1.10 cm  LV E/e' lateral: 6.5 LV IVS:        1.12 cm  LV e' medial:    7.51 cm/s LVOT diam:     1.80 cm  LV E/e' medial:  8.7 LV SV:         45  LV SV Index:   25 LVOT Area:     2.54 cm  RIGHT VENTRICLE RV S prime:     14.60 cm/s TAPSE (M-mode): 1.5 cm LEFT ATRIUM           Index       RIGHT ATRIUM          Index LA diam:      3.20 cm 1.75 cm/m  RA Area:     9.39 cm LA Vol (A2C): 28.4 ml 15.53 ml/m RA Volume:   20.50 ml 11.21 ml/m LA Vol (A4C): 34.5 ml 18.87 ml/m  AORTIC VALVE AV Area (Vmax):    2.10 cm AV Area (Vmean):   2.08 cm AV Area (VTI):     2.29 cm AV Vmax:           124.23 cm/s AV Vmean:          83.099 cm/s AV VTI:            0.196 m AV Peak Grad:      6.2 mmHg AV Mean Grad:      3.1 mmHg LVOT Vmax:         102.35 cm/s LVOT Vmean:        68.044 cm/s LVOT VTI:          0.176 m LVOT/AV VTI ratio: 0.90  AORTA Ao Root diam: 3.30 cm MITRAL VALVE               TRICUSPID VALVE MV Area (PHT): 3.68 cm    TR Peak grad:   31.6 mmHg MV Decel Time: 206 msec    TR Vmax:        281.00 cm/s MV E velocity: 65.50 cm/s MV A velocity: 77.10 cm/s  SHUNTS MV E/A ratio:  0.85        Systemic VTI:  0.18 m  Systemic Diam: 1.80 cm Carlyle Dolly MD Electronically signed by Carlyle Dolly MD Signature Date/Time: 01/23/2020/11:50:45 AM    Final    Scheduled Meds: . abiraterone acetate  1,000 mg Oral Daily  . apixaban  5 mg Oral BID  . aspirin EC  81 mg Oral Daily  . Chlorhexidine Gluconate Cloth  6 each Topical Daily  . methocarbamol  750 mg Oral TID  . metoprolol tartrate  25 mg Oral QID  . multivitamin with minerals  1 tablet Oral Daily  . predniSONE  5 mg Oral BID WC  . rosuvastatin  40 mg Oral QPC supper  . sodium chloride flush  3 mL Intravenous Q12H   Continuous Infusions: . sodium chloride 40 mL/hr at 01/24/20 2014  . sodium chloride Stopped (01/21/20 2312)  . sodium chloride    . cefTRIAXone (ROCEPHIN)  IV Stopped (01/24/20 1232)    LOS: 4 days   Critical Care Procedure Note Authorized and Performed by: Murvin Natal MD  Total Critical Care time:  30 mins  Due to a high probability of clinically significant,  life threatening deterioration, the patient required my highest level of preparedness to intervene emergently and I personally spent this critical care time directly and personally managing the patient.  This critical care time included obtaining a history; examining the patient, pulse oximetry; ordering and review of studies; arranging urgent treatment with development of a management plan; evaluation of patient's response of treatment; frequent reassessment; and discussions with other providers.  This critical care time was performed to assess and manage the high probability of imminent and life threatening deterioration that could result in multi-organ failure.  It was exclusive of separately billable procedures and treating other patients and teaching time.   Irwin Brakeman, MD How to contact the [facility] Attending or Consulting provider [place] or covering provider during after hours [place], for this patient?  1. Check the care team in [facility] and look for a) attending/consulting [facility] provider listed and b) the [facility] team listed 2. Log into [email or web address] and use [place]'s universal password to access. If you do not have the password, please contact the hospital operator. 3. Locate the [facility] provider you are looking for under [facility] and page to a number that you can be directly reached. 4. If you still have difficulty reaching the provider, please page the [facility] (Director on Call) for the Hospitalists listed on amion for assistance.  01/25/2020, 10:57 AM

## 2020-01-25 NOTE — Plan of Care (Signed)

## 2020-01-25 NOTE — Progress Notes (Signed)
ANTICOAGULATION CONSULT NOTE - Initial Consult  Pharmacy Consult for Eliquis Indication: atrial fibrillation  Allergies  Allergen Reactions  . Latex Rash  . Sulfa Antibiotics Rash    Patient Measurements: Height: 5\' 6"  (167.6 cm) Weight: 75.5 kg (166 lb 7.2 oz) IBW/kg (Calculated) : 63.8  Vital Signs: Temp: 98 F (36.7 C) (06/26 0353) Temp Source: Oral (06/26 0353) BP: 163/89 (06/26 0800) Pulse Rate: 69 (06/26 0800)  Labs: Recent Labs    01/23/20 0421 01/23/20 0421 01/24/20 0416 01/25/20 0402  HGB 10.7*   < > 10.8* 11.2*  HCT 33.3*  --  33.1* 34.5*  PLT 147*  --  154 180  CREATININE 1.33*  --  1.42* 1.36*   < > = values in this interval not displayed.    Estimated Creatinine Clearance: 40.4 mL/min (A) (by C-G formula based on SCr of 1.36 mg/dL (H)).   Medical History: Past Medical History:  Diagnosis Date  . Arthritis   . Collagen vascular disease ([place])   . Coronary atherosclerosis of native coronary artery    Multivessel status post CABG 2000  . Essential hypertension   . Hyperlipidemia   . Prostate cancer ([place])     Medications:  Medications Prior to Admission  Medication Sig Dispense Refill Last Dose  . aspirin EC 81 MG tablet Take 1 tablet (81 mg total) by mouth daily. 90 tablet 3 01/20/2020 at 0900  . Calcium-Magnesium-Vitamin D (CALCIUM 1200+D3 PO) Take 1 tablet by mouth daily.    01/20/2020 at Unknown time  . fluticasone (FLONASE) 50 MCG/ACT nasal spray Place 1 spray into both nostrils daily as needed for allergies or rhinitis.     . methocarbamol (ROBAXIN) 750 MG tablet Take 750 mg by mouth 3 (three) times daily.   01/20/2020 at Unknown time  . metoprolol succinate (TOPROL-XL) 25 MG 24 hr tablet TAKE (1) TABLET BY MOUTH DAILY OR AS OTHERWISE DIRECTED. 90 tablet 3 01/20/2020 at 0900  . Multiple Vitamin (MULTIVITAMIN WITH MINERALS) TABS Take 1 tablet by mouth daily.   01/20/2020 at Unknown time  . naproxen sodium (ANAPROX) 220 MG tablet Take 220 mg by  mouth daily as needed (pain).     . predniSONE (DELTASONE) 5 MG tablet Take 1 tablet by mouth 2 (two) times daily.  11 01/20/2020 at Unknown time  . ramipril (ALTACE) 5 MG capsule TAKE (1) CAPSULE BY MOUTH ONCE DAILY. 90 capsule 3 01/20/2020 at Unknown time  . rosuvastatin (CRESTOR) 40 MG tablet Take 1 tablet (40 mg total) by mouth daily after supper. 90 tablet 3 01/20/2020 at Unknown time  . Thiamine HCl (VITAMIN B-1 PO) Take 1 tablet by mouth daily.   01/20/2020 at Unknown time  . Vitamins/Minerals TABS Take by mouth.   01/20/2020 at Unknown time  . ZYTIGA 250 MG tablet Take 4 tablets by mouth daily.  11 01/20/2020 at Unknown time  . nitroGLYCERIN (NITROSTAT) 0.4 MG SL tablet Place 1 tablet (0.4 mg total) under the tongue every 5 (five) minutes x 3 doses as needed for chest pain (if no relief after 3rd dose, proceed to the ED for an evaluation or call 911). 25 tablet 3   . predniSONE (DELTASONE) 20 MG tablet Take 20 mg by mouth 2 (two) times daily. (Patient not taking: Reported on 01/21/2020)   Not Taking at Unknown time    Assessment: 79 year old gentleman with collagen vascular disease, CAD, state cancer that is metastatic, hypertension presented to the emergency department with severe back pain and admitted with severe  sepsis/ strep bacteremia. Patient also with new onset afib. Pharmacy asked to dose  Goal of Therapy:  Monitor platelets by anticoagulation protocol: Yes   Plan:  Eliquis 5mg  po bid Monitor for s/s of bleeding Educate on eliquis  Isac Sarna, BS Vena Austria, BCPS Clinical Pharmacist Pager [phone] 01/25/2020,8:21 AM

## 2020-01-26 LAB — CBC
HCT: 35.5 % — ABNORMAL LOW (ref 39.0–52.0)
Hemoglobin: 11.5 g/dL — ABNORMAL LOW (ref 13.0–17.0)
MCH: 31.1 pg (ref 26.0–34.0)
MCHC: 32.4 g/dL (ref 30.0–36.0)
MCV: 95.9 fL (ref 80.0–100.0)
Platelets: 244 10*3/uL (ref 150–400)
RBC: 3.7 MIL/uL — ABNORMAL LOW (ref 4.22–5.81)
RDW: 14.3 % (ref 11.5–15.5)
WBC: 5.1 10*3/uL (ref 4.0–10.5)
nRBC: 0 % (ref 0.0–0.2)

## 2020-01-26 NOTE — Progress Notes (Signed)
PROGRESS NOTE   Samuel Gregory  ERX:[id number] DOB: 1940/11/13 DOA: 01/21/2020 PCP: Celene Squibb, MD   Chief Complaint  Patient presents with  . Back Pain   Brief Admission History:  79 year old gentleman with collagen vascular disease, CAD, state cancer that is metastatic, hypertension presented to the emergency department with severe back pain and admitted with severe sepsis.  Assessment & Plan:   Principal Problem:   Severe sepsis with septic shock [facility]) Active Problems:   Essential hypertension   Coronary atherosclerosis of native coronary artery   Prostate cancer --with bony metastases, status post prior prostatectomy in 2004   Hypotension   Abdominal pain-Pancreatic and liver masses   Goals of care, counseling/discussion   Palliative care by specialist   DNR (do not resuscitate) discussion   Atrial flutter with rapid ventricular response ([place])   Acute midline low back pain with bilateral sciatica   AKI (acute kidney injury) ([place])  1. Severe sepsis from strep species / strep bacteremia-Sepsis resolved now.   Continue ceftriaxone 2 gm IV daily. Plan to continue antibiotics for 3 more days.  Continue supportive care.  Suspect source was cholangitis.  PT recommending HHPT.  2. Elevated LFTs-Question if he passed a stone, Enzymes are coming down, appreciate gastroenterology consultation and recommendations.  Outpatient liver biopsy planned by Dr. Laural Golden in next 2 weeks.  3. Atrial flutter with RVR - rate has been better controlled on oral metoprolol per cardiology orders, appreciate cardiology consult and recommendations. I spoke with cardiology and GI, ok to use IV amiodarone (no bolus) for rate control if needed. I spoke with GI Dr. Laural Golden and anticoagulation is OK, I counseled with patient about starting apixaban and he was agreeable to starting.  I asked pharm D to dose apixaban and counsel. Monitor.   4. AKI on presumed stage 3b CKD - improved with IV fluid  hydration. 5. Leukocytosis-WBC trending down to normal with treatments. 6. Hypotension-Resolved.  BP markedly improved and has been weaned off the Levophed infusion. 7. Metastatic prostate cancer with bone metastases-stable on maintenance medications, outpatient follow up.  DVT prophylaxis: Heparin/SCDs Code Status: Full Family Communication: Spouse and daughter at bedside Disposition:   Status is: Inpatient  Remains inpatient appropriate because:Hemodynamically unstable, Persistent severe electrolyte disturbances and IV treatments appropriate due to intensity of illness or inability to take PO  Home in next 24-48 hours if HR remains controlled and stable and ok with cardiology and GI.   Dispo: The patient is from: Home              Anticipated d/c is to: Home with HHPT              Anticipated d/c date is: 1 days              Patient currently is not medically stable to d/c.  Consultants:   GI  Palliative   Procedures:   Central line placement  Antimicrobials:  Anti-infectives (From admission, onward)   Start     Dose/Rate Route Frequency Ordered Stop   01/23/20 1200  cefTRIAXone (ROCEPHIN) 2 g in sodium chloride 0.9 % 100 mL IVPB     Discontinue     2 g 200 mL/hr over 30 Minutes Intravenous Every 24 hours 01/23/20 1051     01/22/20 2200  vancomycin (VANCOCIN) IVPB 1000 mg/200 mL premix  Status:  Discontinued        1,000 mg 200 mL/hr over 60 Minutes Intravenous Every 24 hours 01/21/20 2203 01/23/20  1055   01/22/20 0930  ceFEPIme (MAXIPIME) 2 g in sodium chloride 0.9 % 100 mL IVPB  Status:  Discontinued        2 g 200 mL/hr over 30 Minutes Intravenous Every 12 hours 01/21/20 2202 01/23/20 1051   01/22/20 0900  fluconazole (DIFLUCAN) IVPB 100 mg        100 mg 50 mL/hr over 60 Minutes Intravenous Every 24 hours 01/22/20 0829 01/24/20 1033   01/21/20 2200  metroNIDAZOLE (FLAGYL) IVPB 500 mg  Status:  Discontinued        500 mg 100 mL/hr over 60 Minutes Intravenous Every 8  hours 01/21/20 2113 01/23/20 1051   01/21/20 2115  vancomycin (VANCOREADY) IVPB 1500 mg/300 mL        1,500 mg 150 mL/hr over 120 Minutes Intravenous  Once 01/21/20 2101 01/22/20 0315   01/21/20 2115  ceFEPIme (MAXIPIME) 2 g in sodium chloride 0.9 % 100 mL IVPB  Status:  Discontinued        2 g 200 mL/hr over 30 Minutes Intravenous Every 24 hours 01/21/20 2101 01/21/20 2202   01/21/20 1715  cefTRIAXone (ROCEPHIN) 2 g in sodium chloride 0.9 % 100 mL IVPB  Status:  Discontinued        2 g 200 mL/hr over 30 Minutes Intravenous Every 24 hours 01/21/20 1702 01/21/20 2026     Subjective: Patient says he is starting to feel himself again.    Objective: Vitals:   01/25/20 1344 01/25/20 2147 01/26/20 0109 01/26/20 0617  BP: 139/84 (!) 151/84 (!) 104/48 134/84  Pulse: 64 68 87 68  Resp: 16 16 16 18   Temp: 98.5 F (36.9 C) 99 F (37.2 C) 98.9 F (37.2 C) 98.8 F (37.1 C)  TempSrc: Oral Oral Oral Oral  SpO2: 99% 97% 97% 99%  Weight:      Height:        Intake/Output Summary (Last 24 hours) at 01/26/2020 1327 Last data filed at 01/26/2020 0658 Gross per 24 hour  Intake 490 ml  Output 700 ml  Net -210 ml   Filed Weights   01/23/20 0500 01/24/20 0500 01/25/20 0353  Weight: 73.5 kg 73.5 kg 75.5 kg   Examination:  General exam: Appears chronically ill and acutely ill.  No apparent distress.  Speaking in full sentences. Respiratory system:  Respiratory effort normal. Cardiovascular system: S1 & S2 heard, irregularly irregular. No JVD, murmurs, rubs, gallops or clicks. No pedal edema. Gastrointestinal system: Abdomen is nondistended, soft and nontender. No organomegaly or masses felt. Normal bowel sounds heard. Central nervous system: Alert and oriented. No focal neurological deficits. Extremities: Symmetric 5 x 5 power. Skin: No rashes, lesions or ulcers Psychiatry: Judgement and insight appear normal. Mood & affect appropriate.   Data Reviewed: I have personally reviewed following  labs and imaging studies  CBC: Recent Labs  Lab 01/22/20 0430 01/23/20 0421 01/24/20 0416 01/25/20 0402 01/26/20 0834  WBC 10.1 10.5 8.7 5.8 5.1  NEUTROABS  --  9.2* 7.2 4.0  --   HGB 11.5* 10.7* 10.8* 11.2* 11.5*  HCT 36.3* 33.3* 33.1* 34.5* 35.5*  MCV 97.1 96.2 96.2 96.1 95.9  PLT 127* 147* 154 180 846    Basic Metabolic Panel: Recent Labs  Lab 01/21/20 0838 01/21/20 0838 01/21/20 2057 01/22/20 0430 01/23/20 0421 01/24/20 0416 01/25/20 0402  NA 132*  --   --  137 138 136 139  K 3.9  --   --  4.2 3.4* 3.7 3.6  CL 95*  --   --  103 108 105 108  CO2 25  --   --  21* 23 22 22   GLUCOSE 117*  --   --  90 143* 113* 101*  BUN 36*  --   --  30* 21 22 26*  CREATININE 1.96*   < > 1.50* 1.38* 1.33* 1.42* 1.36*  CALCIUM 9.1  --   --  8.6* 8.3* 8.2* 8.8*  MG  --   --   --   --  1.7 2.3  --    < > = values in this interval not displayed.    GFR: Estimated Creatinine Clearance: 40.4 mL/min (A) (by C-G formula based on SCr of 1.36 mg/dL (H)).  Liver Function Tests: Recent Labs  Lab 01/21/20 0838 01/22/20 0430 01/23/20 0421 01/24/20 0416 01/25/20 0402  AST 75* 153* 65* 43* 43*  ALT 74* 141* 93* 76* 69*  ALKPHOS 77 75 68 79 78  BILITOT 0.9 0.8 0.7 0.4 0.5  PROT 6.2* 5.7* 5.1* 5.4* 5.3*  ALBUMIN 2.7* 2.3* 2.1* 2.2* 2.2*    CBG: No results for input(s): GLUCAP in the last 168 hours.  Recent Results (from the past 240 hour(s))  Culture, blood (single) w Reflex to ID Panel     Status: Abnormal   Collection Time: 01/21/20  9:04 AM   Specimen: BLOOD  Result Value Ref Range Status   Specimen Description   Final    BLOOD RIGHT ANTECUBITAL Performed at [facility], [address], [place], [place] [address]    Special Requests   Final    BOTTLES DRAWN AEROBIC AND ANAEROBIC Blood Culture adequate volume Performed at [facility], [address] [place], [place] [address]    Culture  Setup Time   Final    GRAM POSITIVE COCCI ANAEROBIC BOTTLE ONLY Gram Stain Report  Called to,Read Back By and Verified With: SHE[place] @ [id number] ON [id number] BY HENDERSON L. AEROBIC BOTTLE ALSO Performed at [facility], [address], [place], [place] [address]    Culture STREPTOCOCCUS INTERMEDIUS (A)  Final   Report Status 01/25/2020 FINAL  Final   Organism ID, Bacteria STREPTOCOCCUS INTERMEDIUS  Final      Susceptibility   Streptococcus intermedius - MIC*    PENICILLIN <=0.06 SENSITIVE Sensitive     CEFTRIAXONE 0.25 SENSITIVE Sensitive     ERYTHROMYCIN 2 RESISTANT Resistant     LEVOFLOXACIN 0.5 SENSITIVE Sensitive     VANCOMYCIN 0.5 SENSITIVE Sensitive     * STREPTOCOCCUS INTERMEDIUS  Blood Culture ID Panel (Reflexed)     Status: Abnormal   Collection Time: 01/21/20  9:04 AM  Result Value Ref Range Status   Enterococcus species NOT DETECTED NOT DETECTED Final   Listeria monocytogenes NOT DETECTED NOT DETECTED Final   Staphylococcus species NOT DETECTED NOT DETECTED Final   Staphylococcus aureus (BCID) NOT DETECTED NOT DETECTED Final   Streptococcus species DETECTED (A) NOT DETECTED Final    Comment: Not Enterococcus species, Streptococcus agalactiae, Streptococcus pyogenes, or Streptococcus pneumoniae. CRITICAL RESULT CALLED TO, READ BACK BY AND VERIFIED WITH: Gloris Manchester PharmD 15:45 01/22/20 ([email or web address])    Streptococcus agalactiae NOT DETECTED NOT DETECTED Final   Streptococcus pneumoniae NOT DETECTED NOT DETECTED Final   Streptococcus pyogenes NOT DETECTED NOT DETECTED Final   Acinetobacter baumannii NOT DETECTED NOT DETECTED Final   Enterobacteriaceae species NOT DETECTED NOT DETECTED Final   Enterobacter cloacae complex NOT DETECTED NOT DETECTED Final   Escherichia coli NOT DETECTED NOT DETECTED Final   Klebsiella oxytoca NOT DETECTED NOT DETECTED Final  Klebsiella pneumoniae NOT DETECTED NOT DETECTED Final   Proteus species NOT DETECTED NOT DETECTED Final   Serratia marcescens NOT DETECTED NOT DETECTED Final   Haemophilus influenzae NOT DETECTED NOT DETECTED Final    Neisseria meningitidis NOT DETECTED NOT DETECTED Final   Pseudomonas aeruginosa NOT DETECTED NOT DETECTED Final   Candida albicans NOT DETECTED NOT DETECTED Final   Candida glabrata NOT DETECTED NOT DETECTED Final   Candida krusei NOT DETECTED NOT DETECTED Final   Candida parapsilosis NOT DETECTED NOT DETECTED Final   Candida tropicalis NOT DETECTED NOT DETECTED Final    Comment: Performed at [facility], [place] [address] [place], [place] [address]  Urine Culture     Status: None   Collection Time: 01/21/20 10:38 AM   Specimen: Urine, Clean Catch  Result Value Ref Range Status   Specimen Description   Final    URINE, CLEAN CATCH Performed at [facility], [address], [place], [place] [address]    Special Requests   Final    Normal Performed at [facility], [address], [place], [place] [address]    Culture   Final    NO GROWTH Performed at [facility], [place] [address] [place], [place] [address]    Report Status 01/23/2020 FINAL  Final  SARS Coronavirus 2 by RT PCR (hospital order, performed in [facility] hospital lab) Nasopharyngeal Nasopharyngeal Swab     Status: None   Collection Time: 01/21/20  3:14 PM   Specimen: Nasopharyngeal Swab  Result Value Ref Range Status   SARS Coronavirus 2 NEGATIVE NEGATIVE Final    Comment: (NOTE) SARS-CoV-2 target nucleic acids are NOT DETECTED.  The SARS-CoV-2 RNA is generally detectable in upper and lower respiratory specimens during the acute phase of infection. The lowest concentration of SARS-CoV-2 viral copies this assay can detect is 250 copies / mL. A negative result does not preclude SARS-CoV-2 infection and should not be used as the sole basis for treatment or other patient management decisions.  A negative result may occur with improper specimen collection / handling, submission of specimen other than nasopharyngeal swab, presence of viral mutation(s) within the areas targeted by this assay, and inadequate  number of viral copies (<250 copies / mL). A negative result must be combined with clinical observations, patient history, and epidemiological information.  Fact Sheet for Patients:   StrictlyIdeas.no  Fact Sheet for Healthcare Providers: BankingDealers.co.za  This test is not yet approved or  cleared by the [place] FDA and has been authorized for detection and/or diagnosis of SARS-CoV-2 by FDA under an Emergency Use Authorization (EUA).  This EUA will remain in effect (meaning this test can be used) for the duration of the COVID-19 declaration under Section 564(b)(1) of the Act, 21 U.S.C. section 360bbb-3(b)(1), unless the authorization is terminated or revoked sooner.  Performed at [facility], [address] [place], [place] [address]   MRSA PCR Screening     Status: None   Collection Time: 01/22/20  3:31 AM   Specimen: Nasal Mucosa; Nasopharyngeal  Result Value Ref Range Status   MRSA by PCR NEGATIVE NEGATIVE Final    Comment:        The GeneXpert MRSA Assay (FDA approved for NASAL specimens only), is one component of a comprehensive MRSA colonization surveillance program. It is not intended to diagnose MRSA infection nor to guide or monitor treatment for MRSA infections. Performed at [facility], [address], [place], [place] [address]  Radiology Studies: No results found. Scheduled Meds: . abiraterone acetate  1,000 mg Oral Daily  . apixaban  5 mg Oral BID  . aspirin EC  81 mg Oral Daily  . Chlorhexidine Gluconate Cloth  6 each Topical Daily  . methocarbamol  750 mg Oral TID  . metoprolol tartrate  25 mg Oral QID  . multivitamin with minerals  1 tablet Oral Daily  . predniSONE  5 mg Oral BID WC  . rosuvastatin  40 mg Oral QPC supper  . sodium chloride flush  3 mL Intravenous Q12H   Continuous Infusions: . sodium chloride Stopped (01/21/20 2312)  . sodium chloride    . cefTRIAXone (ROCEPHIN)  IV 2  g (01/26/20 1255)    LOS: 5 days   Irwin Brakeman, MD How to contact the [facility] Attending or Consulting provider [place] or covering provider during after hours [place], for this patient?  1. Check the care team in [facility] and look for a) attending/consulting TR[facility] provider listed and b) the [facility] team listed 2. Log into [email or web address] and use [place]'s universal password to access. If you do not have the password, please contact the hospital operator. 3. Locate the [facility] provider you are looking for under [facility] and page to a number that you can be directly reached. 4. If you still have difficulty reaching the provider, please page the [facility] (Director on Call) for the Hospitalists listed on amion for assistance.  01/26/2020, 1:27 PM

## 2020-01-27 LAB — CBC
HCT: 36.2 % — ABNORMAL LOW (ref 39.0–52.0)
Hemoglobin: 11.4 g/dL — ABNORMAL LOW (ref 13.0–17.0)
MCH: 30.6 pg (ref 26.0–34.0)
MCHC: 31.5 g/dL (ref 30.0–36.0)
MCV: 97.1 fL (ref 80.0–100.0)
Platelets: 269 10*3/uL (ref 150–400)
RBC: 3.73 MIL/uL — ABNORMAL LOW (ref 4.22–5.81)
RDW: 14.4 % (ref 11.5–15.5)
WBC: 5.3 10*3/uL (ref 4.0–10.5)
nRBC: 0 % (ref 0.0–0.2)

## 2020-01-27 LAB — COMPREHENSIVE METABOLIC PANEL
ALT: 92 U/L — ABNORMAL HIGH (ref 0–44)
AST: 61 U/L — ABNORMAL HIGH (ref 15–41)
Albumin: 2.5 g/dL — ABNORMAL LOW (ref 3.5–5.0)
Alkaline Phosphatase: 82 U/L (ref 38–126)
Anion gap: 10 (ref 5–15)
BUN: 28 mg/dL — ABNORMAL HIGH (ref 8–23)
CO2: 25 mmol/L (ref 22–32)
Calcium: 8.7 mg/dL — ABNORMAL LOW (ref 8.9–10.3)
Chloride: 104 mmol/L (ref 98–111)
Creatinine, Ser: 1.33 mg/dL — ABNORMAL HIGH (ref 0.61–1.24)
GFR calc Af Amer: 59 mL/min — ABNORMAL LOW (ref >60)
GFR calc non Af Amer: 51 mL/min — ABNORMAL LOW (ref >60)
Glucose, Bld: 93 mg/dL (ref 70–99)
Potassium: 4 mmol/L (ref 3.5–5.1)
Sodium: 139 mmol/L (ref 135–145)
Total Bilirubin: 0.5 mg/dL (ref 0.3–1.2)
Total Protein: 5.5 g/dL — ABNORMAL LOW (ref 6.5–8.1)

## 2020-01-27 MED ORDER — CEFDINIR 300 MG PO CAPS
300.0000 mg | ORAL_CAPSULE | Freq: Two times a day (BID) | ORAL | 0 refills | Status: DC
Start: 2020-01-27 — End: 2020-01-31

## 2020-01-27 MED ORDER — METOPROLOL TARTRATE 75 MG PO TABS: 75.0000 mg | ORAL_TABLET | Freq: Two times a day (BID) | ORAL | 1 refills | Status: DC

## 2020-01-27 MED ORDER — METOPROLOL TARTRATE 50 MG PO TABS
75.0000 mg | ORAL_TABLET | Freq: Two times a day (BID) | ORAL | Status: DC
  Administered 2020-01-27: 75 mg via ORAL
  Filled 2020-01-27: qty 1

## 2020-01-27 MED ORDER — APIXABAN 5 MG PO TABS: 5.0000 mg | ORAL_TABLET | Freq: Two times a day (BID) | ORAL | 1 refills | Status: DC

## 2020-01-27 NOTE — Progress Notes (Signed)
Nsg Discharge Note  Admit Date:  01/21/2020 Discharge date: 01/27/2020   Clovis Fredrickson to be D/C'd Home per MD order.  Patient able to verbalize understanding.  Discharge Medication: Allergies as of 01/27/2020      Reactions   Latex Rash   Sulfa Antibiotics Rash      Medication List    STOP taking these medications   aspirin EC 81 MG tablet   metoprolol succinate 25 MG 24 hr tablet Commonly known as: TOPROL-XL   ramipril 5 MG capsule Commonly known as: ALTACE   Vitamins/Minerals Tabs     TAKE these medications   apixaban 5 MG Tabs tablet Commonly known as: ELIQUIS Take 1 tablet (5 mg total) by mouth 2 (two) times daily.   CALCIUM 1200+D3 PO Take 1 tablet by mouth daily.   cefdinir 300 MG capsule Commonly known as: OMNICEF Take 1 capsule (300 mg total) by mouth 2 (two) times daily for 3 days.   fluticasone 50 MCG/ACT nasal spray Commonly known as: FLONASE Place 1 spray into both nostrils daily as needed for allergies or rhinitis.   methocarbamol 750 MG tablet Commonly known as: ROBAXIN Take 750 mg by mouth 3 (three) times daily.   Metoprolol Tartrate 75 MG Tabs Take 75 mg by mouth 2 (two) times daily.   multivitamin with minerals Tabs tablet Take 1 tablet by mouth daily.   naproxen sodium 220 MG tablet Commonly known as: ALEVE Take 220 mg by mouth daily as needed (pain).   nitroGLYCERIN 0.4 MG SL tablet Commonly known as: NITROSTAT Place 1 tablet (0.4 mg total) under the tongue every 5 (five) minutes x 3 doses as needed for chest pain (if no relief after 3rd dose, proceed to the ED for an evaluation or call 911).   predniSONE 5 MG tablet Commonly known as: DELTASONE Take 1 tablet by mouth 2 (two) times daily. What changed: Another medication with the same name was removed. Continue taking this medication, and follow the directions you see here.   rosuvastatin 40 MG tablet Commonly known as: CRESTOR Take 1 tablet (40 mg total) by mouth daily after  supper.   VITAMIN B-1 PO Take 1 tablet by mouth daily.   Zytiga 250 MG tablet Generic drug: abiraterone acetate Take 4 tablets by mouth daily.       Discharge Assessment: Vitals:   01/27/20 0544 01/27/20 0843  BP: (!) 152/83   Pulse: 65   Resp: 18   Temp: 98.4 F (36.9 C)   SpO2: 96% 99%   Skin clean, dry and intact without evidence of skin break down, no evidence of skin tears noted. IV catheter discontinued intact. Site without signs and symptoms of complications - no redness or edema noted at insertion site, patient denies c/o pain - only slight tenderness at site.  Dressing with slight pressure applied.  D/c Instructions-Education: Discharge instructions given to patient with verbalized understanding. D/c education completed with patient including follow up instructions, medication list, d/c activities limitations if indicated, with other d/c instructions as indicated by MD - patient able to verbalize understanding, all questions fully answered. Patient instructed to return to ED, call 911, or call MD for any changes in condition.  Patient escorted via [place], and D/C home via private auto.  Berton Bon, RN 01/27/2020 12:07 PM

## 2020-01-27 NOTE — TOC Transition Note (Signed)
Transition of Care [facility]) - CM/SW Discharge Note   Patient Details  Name: Samuel Gregory MRN: [id number] Date of Birth: 05/25/1941  Transition of Care [facility]) CM/SW Contact:  Shade Flood, LCSW Phone Number: 01/27/2020, 10:41 AM   Clinical Narrative:     Pt stable for dc home today per MD. Damaris Schooner with pt and his wife to determine if pt needs any DME for dc. They each indicate that pt should not need any DME. Will update Linda with [facility] of pt's dc as they will follow for HHPT.  No other TOC needs identified.   Final next level of care: [place] Barriers to Discharge: Barriers Resolved   Patient Goals and CMS Choice Patient states their goals for this hospitalization and ongoing recovery are:: return home   Choice offered to / list presented to : Patient  Discharge Placement                       Discharge Plan and Services In-house Referral: Clinical Social Work   Post Acute Care Choice: [place]: PT [facility] Agency: [place] ([place]) Date [place]: 01/24/20 Time [place]: [place] Representative spoke with at [place]: Zada Finders  Social Determinants of Health (SDOH) Interventions     Readmission Risk Interventions No flowsheet data found.

## 2020-01-27 NOTE — Discharge Instructions (Signed)
Bacteremia, Adult Bacteremia is the presence of bacteria in the blood. When bacteria enter the bloodstream, they can cause a life-threatening reaction called sepsis. Sepsis is a medical emergency. What are the causes? This condition is caused by bacteria that get into the blood. Bacteria can enter the blood from an infection, including:  A skin infection or injury, such as a burn or a cut.  A lung infection (pneumonia).  An infection in the stomach or intestines.  An infection in the bladder or urinary system (urinary tract infection).  A bacterial infection in another part of the body that spreads to the blood. Bacteria can also enter the blood during a dental or medical procedure, from bleeding gums, or through use of an unclean needle. What increases the risk? This condition is more likely to develop in children, older adults, and people who have:  A long-term (chronic) disease or condition like diabetes or chronic kidney failure.  An artificial joint or heart valve, or heart valve disease.  A tube inserted to treat a medical condition, such as a urinary catheter or IV.  A weak disease-fighting system (immune system).  Injected illegal drugs.  Been hospitalized for more than 10 days in a row. What are the signs or symptoms? Symptoms of this condition include:  Fever and chills.  Fast heartbeat and shortness of breath.  Dizziness, weakness, and low blood pressure.  Confusion or anxiety.  Pain in the abdomen, nausea, vomiting, and diarrhea. Bacteremia that has spread to other parts of the body may cause symptoms in those areas. In some cases, there are no symptoms. How is this diagnosed? This condition may be diagnosed with a physical exam and tests, such as:  Blood tests to check for bacteria (cultures) or other signs of infection.  Tests of any tubes that you have had inserted. These tests check for a source of infection.  Urine tests to check for bacteria in the  urine.  Imaging tests, such as an X-ray, a CT scan, an MRI, or a heart ultrasound. These check for a source of infection in other parts of your body, such as your lungs, heart valves, or joints. How is this treated? This condition is usually treated in the hospital. If you are treated at home, you may need to return to the hospital for medicines, blood tests, and evaluation. Treatment may include:  Antibiotic medicines. These may be given by mouth or directly into your blood through an IV. You may need antibiotics for several weeks. At first, you may be given an antibiotic to kill most types of blood bacteria. If tests show that a certain kind of bacteria is causing the problem, you may be given a different antibiotic.  IV fluids.  Removing any catheter or device that could be a source of infection.  Blood pressure and breathing support, if needed.  Surgery to control the source or the spread of infection, such as surgery to remove an implanted device, abscess, or infected tissue. Follow these instructions at home: Medicines  Take over-the-counter and prescription medicines only as told by your health care provider.  If you were prescribed an antibiotic medicine, take it as told by your health care provider. Do not stop taking the antibiotic even if you start to feel better. General instructions   Rest as needed. Ask your health care provider when you may return to normal activities.  Drink enough fluid to keep your urine pale yellow.  Do not use any products that contain nicotine or  tobacco, such as cigarettes, e-cigarettes, and chewing tobacco. If you need help quitting, ask your health care provider.  Keep all follow-up visits as told by your health care provider. This is important. How is this prevented?   Wash your hands regularly with soap and water. If soap and water are not available, use hand sanitizer.  You should wash your hands: ? After using the toilet or changing a  diaper. ? Before preparing, cooking, serving, or eating food. ? While caring for a sick person or while visiting someone in a hospital. ? Before and after changing bandages (dressings) over wounds.  Clean any scrapes or cuts with soap and water and cover them with a clean bandage.  Get vaccinations as recommended by your health care provider.  Practice good oral hygiene. Brush your teeth two times a day, and floss regularly.  Take good care of your skin. This includes bathing and moisturizing on a regular basis. Contact a health care provider if:  Your symptoms get worse, and medicines do not help.  You have severe pain. Get help right away if you have:  Pain.  A fever or chills.  Trouble breathing.  A fast heart rate.  Skin that is blotchy, pale, or clammy.  Confusion.  Weakness.  Lack of energy or unusual sleepiness.  New symptoms that develop after treatment has started. These symptoms may represent a serious problem that is an emergency. Do not wait to see if the symptoms will go away. Get medical help right away. Call your local emergency services (911 in the [place] Do not drive yourself to the hospital. Summary  Bacteremia is the presence of bacteria in the blood. When bacteria enter the bloodstream, they can cause a life-threatening reaction called sepsis.  Bacteremia is usually treated with antibiotic medicines in the hospital.  If you were prescribed an antibiotic medicine, take it as told by your health care provider. Do not stop taking the antibiotic even if you start to feel better.  Get help right away if you have any new symptoms that develop after treatment has started. This information is not intended to replace advice given to you by your health care provider. Make sure you discuss any questions you have with your health care provider. Document Revised: 12/07/2018 Document Reviewed: 12/07/2018 Else[employer] Patient Education  [employer].    IMPORTANT INFORMATION: PAY CLOSE ATTENTION   PHYSICIAN DISCHARGE INSTRUCTIONS  Follow with Primary care provider  Celene Squibb, MD  and other consultants as instructed by your Hospitalist Physician  [place] IF SYMPTOMS COME BACK, WORSEN OR NEW PROBLEM DEVELOPS   Please note: You were cared for by a hospitalist during your hospital stay. Every effort will be made to forward records to your primary care provider.  You can request that your primary care provider send for your hospital records if they have not received them.  Once you are discharged, your primary care physician will handle any further medical issues. Please note that NO REFILLS for any discharge medications will be authorized once you are discharged, as it is imperative that you return to your primary care physician (or establish a relationship with a primary care physician if you do not have one) for your post hospital discharge needs so that they can reassess your need for medications and monitor your lab values.  Please get a complete blood count and chemistry panel checked by your Primary MD at your next visit, and again  as instructed by your Primary MD.  Get Medicines reviewed and adjusted: Please take all your medications with you for your next visit with your Primary MD  Laboratory/radiological data: Please request your Primary MD to go over all hospital tests and procedure/radiological results at the follow up, please ask your primary care provider to get all Hospital records sent to his/her office.  In some cases, they will be blood work, cultures and biopsy results pending at the time of your discharge. Please request that your primary care provider follow up on these results.  If you are diabetic, please bring your blood sugar readings with you to your follow up appointment with primary care.    Please call and make your follow up appointments as soon as possible.     Also Note the following: If you experience worsening of your admission symptoms, develop shortness of breath, life threatening emergency, suicidal or homicidal thoughts you must seek medical attention immediately by calling 911 or calling your MD immediately  if symptoms less severe.  You must read complete instructions/literature along with all the possible adverse reactions/side effects for all the Medicines you take and that have been prescribed to you. Take any new Medicines after you have completely understood and accpet all the possible adverse reactions/side effects.   Do not drive when taking Pain medications or sleeping medications (Benzodiazepines)  Do not take more than prescribed Pain, Sleep and Anxiety Medications. It is not advisable to combine anxiety,sleep and pain medications without talking with your primary care practitioner  Special Instructions: If you have smoked or chewed Tobacco  in the last 2 yrs please stop smoking, stop any regular Alcohol  and or any Recreational drug use.  Wear Seat belts while driving.  Do not drive if taking any narcotic, mind altering or controlled substances or recreational drugs or alcohol.

## 2020-01-27 NOTE — Care Management Important Message (Signed)
Important Message  Patient Details  Name: Samuel Gregory MRN: [id number] Date of Birth: Nov 18, 1940   Medicare Important Message Given:  Yes     Tommy Medal 01/27/2020, 12:56 PM

## 2020-01-27 NOTE — Progress Notes (Signed)
Telemetry reviewed shows still some short episodes of aflutter.    Patient with new diagnosis of paroxyamal aflutter this admission in setting of severe systemic illness. He is on lopressor 25mg  every 6 hours. We will change lopressor to 75mg  bid.  Initially anticoag was not started due to newly diagnosed liver lesion, from primary team notes after discussions with GI ok to start eliquis which patient is now on.   Would consider an outpatient event monitor in the next few months at f/u to see if flutter was isolated in setting of severe systemic illness or a longer term issue.   Carlyle Dolly MD

## 2020-01-27 NOTE — Discharge Summary (Signed)
Physician Discharge Summary  [name]:[id number] DOB: [birth year] DOA: 01/21/2020  PCP: Celene Squibb, MD Cardiologist: Dennard Schaumann date: 01/21/2020 Discharge date: 01/27/2020  Admitted From:  Home  Disposition:  Home   Recommendations for Outpatient Follow-up:  1. Follow up with PCP in 1-2 weeks 2. Follow up with cardiologist in 2 weeks as already scheduled 3. Please follow up with Dr. Laural Golden to discuss liver biopsy in 1-2 weeks 4. Please obtain CBC/CMP in 1-2 weeks 5. Please consider outpatient 30 day heart monitor   Home Health: PT   Discharge Condition: STABLE   CODE STATUS: FULL    Brief Hospitalization Summary: Please see all hospital notes, images, labs for full details of the hospitalization. ADMISSION HPI:  Samuel Gregory  is a 79 y.o. male history of collagen vascular disease, CAD, hypertension, prostate cancer and chronic back pain who presents with worsening back pain for the last couple of weeks  -No vomiting or diarrhea -Denies fevers or chills\- -had some nausea and slight abdominal discomfort -No dysuria or hematuria -Denies any recent fall or trauma In ED--is found to have a white count of 15.4 and hemoglobin of 12.4 (no recent baseline available) -History remarkable for sodium of 132, chloride of 95, glucose of 117 creatinine was up to 1.96, no recent creatinines available, -LFTs were elevated with AST of 75 and ALT of 74, -Lipase was only 24 -UA was suggestive of possible UTI urine culture pending -CT Renal Stone Protocol-- IMPRESSION: 1. Subtle area of relative low attenuation within the posterior aspect of the pancreatic head which appears new from prior. Pancreatic mass is not excluded. When the patient is clinically stable and able to follow directions and hold their breath (preferably as an outpatient) further evaluation with dedicated ontrast-enhanced pancreatic protocol MRI of the abdomen should be considered. 2. New slightly ill-defined 3.0 cm  lesion within the inferior aspect of the right hepatic lobe with at least 1 additional subcentimeter indeterminate lesions within the liver. Findings are nonspecific but could reflect hepatic metastatic disease. These findings can also be further evaluated on the previously recommended MRI. 3. 5 mm noncalcified right lower lobe pulmonary nodule, new from prior. 4. The sclerotic osseous lesions seen on prior CT 03/08/2016 have resolved. 5. Cholelithiasis without evidence of acute cholecystitis. 6. Colonic diverticulosis without evidence of acute diverticulitis. 7. Aortic atherosclerosis. (ICD10-I70.0).  Abd MRI-- There is a subcapsular mass in the inferior tip of the right lobe of the liver, hepatic segment VI, measuring 3.3 x 2.5 cm, with very gradual progressive peripheral nodular enhancement. 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  --I called -his spouse Ms Katharine Look Meunier---[phone]--as per patient's wife patient has had fevers and chills at least for over 24 hours  --Patient remained persistently hypotensive-despite IV fluid boluses -Blood cultures urine cultures obtained, IV Vanco, flagyl and cefepime started patient had received IV Rocephin earlier, -EDP to place central line for Levophed due to persistent hypotension  -- patient meets criteria for severe sepsis with septic shock due to has tachycardia, tachypnea as well as fever, chills, wbc 15k, presumed AKI and persistent hypotension  ________________________________________________________________________________  Brief Admission History:  79 year old gentleman with collagen vascular disease, CAD, state cancer that is metastatic, hypertension presented to the emergency department with severe back pain and admitted with severe sepsis.  Assessment & Plan:   Principal Problem:   Severe sepsis with septic shock Active  Problems:  Essential hypertension   Coronary atherosclerosis of native coronary artery   Prostate cancer --with bony metastases, status post prior prostatectomy in 2004   Hypotension   Abdominal pain-Pancreatic and liver masses   Goals of care, counseling/discussion   Palliative care by specialist   DNR (do not resuscitate) discussion   Atrial flutter with rapid ventricular response ([place])   Acute midline low back pain with bilateral sciatica   AKI (acute kidney injury) ([place])  1. Severe sepsis from strep species / strep bacteremia-Sepsis resolved now.   He was treated ceftriaxone 2 gm IV daily and will continue oral antibiotics for 3 more days to complete full 10 day course of therapy.   C  Suspect source was cholangitis.  PT recommending HHPT.  2. Elevated LFTs-Question if he passed a stone, Enzymes are coming down, appreciate gastroenterology consultation and recommendations.  Outpatient liver biopsy planned by Dr. Laural Golden in next 2 weeks.  3. Atrial flutter with RVR - rate has been better controlled on oral metoprolol per cardiology orders, appreciate cardiology consult and recommendations.  I spoke with GI Dr. Laural Golden and anticoagulation is OK, I counseled with patient about starting apixaban and he was agreeable to starting.  I asked pharm D to dose apixaban and counsel. Monitor.  Cardiology recommending metoprolol 75 mg BID.  Outpatient follow up for consideration of 30 day heart monitor.  Bleeding precautions while on anticoagulant therapy.   4. AKI on presumed stage 3b CKD - improved with IV fluid hydration. 5. Leukocytosis-WBC trending down to normal with treatments. 6. Hypotension-Resolved.  BP markedly improved and has been weaned off the Levophed infusion. 7. Metastatic prostate cancer with bone metastases-stable on maintenance medications, outpatient follow up.  DVT prophylaxis: apixaban Code Status: Full Family Communication: Spouse and daughter Disposition:   Discharge  Diagnoses:  Principal Problem:   Severe sepsis with septic shock ([place]) Active Problems:   Essential hypertension   Coronary atherosclerosis of native coronary artery   Prostate cancer --with bony metastases, status post prior prostatectomy in 2004   Hypotension   Abdominal pain-Pancreatic and liver masses   Goals of care, counseling/discussion   Palliative care by specialist   DNR (do not resuscitate) discussion   Atrial flutter with rapid ventricular response ([place])   Acute midline low back pain with bilateral sciatica   AKI (acute kidney injury) [facility])   Discharge Instructions:  Allergies as of 01/27/2020      Reactions   Latex Rash   Sulfa Antibiotics Rash      Medication List    STOP taking these medications   aspirin EC 81 MG tablet   metoprolol succinate 25 MG 24 hr tablet Commonly known as: TOPROL-XL   ramipril 5 MG capsule Commonly known as: ALTACE   Vitamins/Minerals Tabs     TAKE these medications   apixaban 5 MG Tabs tablet Commonly known as: ELIQUIS Take 1 tablet (5 mg total) by mouth 2 (two) times daily.   CALCIUM 1200+D3 PO Take 1 tablet by mouth daily.   cefdinir 300 MG capsule Commonly known as: OMNICEF Take 1 capsule (300 mg total) by mouth 2 (two) times daily for 3 days.   fluticasone 50 MCG/ACT nasal spray Commonly known as: FLONASE Place 1 spray into both nostrils daily as needed for allergies or rhinitis.   methocarbamol 750 MG tablet Commonly known as: ROBAXIN Take 750 mg by mouth 3 (three) times daily.   Metoprolol Tartrate 75 MG Tabs Take 75 mg by mouth 2 (two) times daily.  multivitamin with minerals Tabs tablet Take 1 tablet by mouth daily.   naproxen sodium 220 MG tablet Commonly known as: ALEVE Take 220 mg by mouth daily as needed (pain).   nitroGLYCERIN 0.4 MG SL tablet Commonly known as: NITROSTAT Place 1 tablet (0.4 mg total) under the tongue every 5 (five) minutes x 3 doses as needed for chest pain (if no relief  after 3rd dose, proceed to the ED for an evaluation or call 911).   predniSONE 5 MG tablet Commonly known as: DELTASONE Take 1 tablet by mouth 2 (two) times daily. What changed: Another medication with the same name was removed. Continue taking this medication, and follow the directions you see here.   rosuvastatin 40 MG tablet Commonly known as: CRESTOR Take 1 tablet (40 mg total) by mouth daily after supper.   VITAMIN B-1 PO Take 1 tablet by mouth daily.   Zytiga 250 MG tablet Generic drug: abiraterone acetate Take 4 tablets by mouth daily.       Follow-up Information    [facility] Follow [facility].   Specialty: Home Health Services Why: Will call you to arrange home health PT.        Celene Squibb, MD. Schedule an appointment as soon as possible for a visit in 1 week.   Specialty: Internal Medicine Contact information: [place] [facility] [id number] [phone]        Satira Sark, MD. Schedule an appointment as soon as possible for a visit on 02/17/2020.   Specialty: Cardiology Why: 1:30 with Natasha Mead information: [place] [address] [phone]        Rogene Houston, MD. Schedule an appointment as soon as possible for a visit in 1 week(s).   Specialty: Gastroenterology Why: Hospital Follow Up  Contact information: [address], [address] [place] [address] [phone]              Allergies  Allergen Reactions  . Latex Rash  . Sulfa Antibiotics Rash   Allergies as of 01/27/2020      Reactions   Latex Rash   Sulfa Antibiotics Rash      Medication List    STOP taking these medications   aspirin EC 81 MG tablet   metoprolol succinate 25 MG 24 hr tablet Commonly known as: TOPROL-XL   ramipril 5 MG capsule Commonly known as: ALTACE   Vitamins/Minerals Tabs     TAKE these medications   apixaban 5 MG Tabs tablet Commonly known as: ELIQUIS Take 1 tablet (5 mg total) by  mouth 2 (two) times daily.   CALCIUM 1200+D3 PO Take 1 tablet by mouth daily.   cefdinir 300 MG capsule Commonly known as: OMNICEF Take 1 capsule (300 mg total) by mouth 2 (two) times daily for 3 days.   fluticasone 50 MCG/ACT nasal spray Commonly known as: FLONASE Place 1 spray into both nostrils daily as needed for allergies or rhinitis.   methocarbamol 750 MG tablet Commonly known as: ROBAXIN Take 750 mg by mouth 3 (three) times daily.   Metoprolol Tartrate 75 MG Tabs Take 75 mg by mouth 2 (two) times daily.   multivitamin with minerals Tabs tablet Take 1 tablet by mouth daily.   naproxen sodium 220 MG tablet Commonly known as: ALEVE Take 220 mg by mouth daily as needed (pain).   nitroGLYCERIN 0.4 MG SL tablet Commonly known as: NITROSTAT Place 1 tablet (0.4 mg total) under the tongue  every 5 (five) minutes x 3 doses as needed for chest pain (if no relief after 3rd dose, proceed to the ED for an evaluation or call 911).   predniSONE 5 MG tablet Commonly known as: DELTASONE Take 1 tablet by mouth 2 (two) times daily. What changed: Another medication with the same name was removed. Continue taking this medication, and follow the directions you see here.   rosuvastatin 40 MG tablet Commonly known as: CRESTOR Take 1 tablet (40 mg total) by mouth daily after supper.   VITAMIN B-1 PO Take 1 tablet by mouth daily.   Zytiga 250 MG tablet Generic drug: abiraterone acetate Take 4 tablets by mouth daily.       Procedures/Studies: MR 3D Recon At Scanner  Result Date: 01/21/2020 CLINICAL DATA:  Evaluate liver and pancreas lesions EXAM: MRI ABDOMEN WITHOUT AND WITH CONTRAST (INCLUDING MRCP) TECHNIQUE: Multiplanar multisequence MR imaging of the abdomen was performed both before and after the administration of intravenous contrast. Heavily T2-weighted images of the biliary and pancreatic ducts were obtained, and three-dimensional MRCP images were rendered by post processing.  CONTRAST:  37mL GADAVIST GADOBUTROL 1 MMOL/ML IV SOLN COMPARISON:  Same day CT abdomen pelvis, 01/21/2020, 04/08/2016, 10/29/2007 FINDINGS: Examination is generally limited by extensive breath motion artifact throughout. Lower chest: Trace pleural effusions. Hepatobiliary: There is a subcapsular mass in the inferior tip of the right lobe of the liver, hepatic segment VI, measuring 3.3 x 2.5 cm, with slight heterogeneous internal T2 hyperintensity, diffusion restriction, and demonstrating very gradual progressive peripheral nodular enhancement (series 19, image 49). An additional lesion in the posterior right lobe of the liver, hepatic segment VI, measures 0.8 cm and is too small to characterize although likely a subcentimeter cyst or hemangioma. Multiple small gallstones in the dependent gallbladder. Pancreas: There is a lobulated fluid signal, nonenhancing lesion of the pancreatic head immediately adjacent to the central common bile duct measuring 1.5 x 1.4 cm (series 22, image 25, series 4, image 22). No pancreatic ductal dilatation. Spleen:  Within normal limits in size and appearance. Adrenals/Urinary Tract: No masses identified. No evidence of hydronephrosis. Stomach/Bowel: Visualized portions within the abdomen are unremarkable. Vascular/Lymphatic: No pathologically enlarged lymph nodes identified. No abdominal aortic aneurysm demonstrated. Other:  None. Musculoskeletal: No suspicious bone lesions identified. IMPRESSION: 1. Examination is limited by extensive breath motion artifact throughout. 2. There is a subcapsular mass in the inferior tip of the right lobe of the liver, hepatic segment VI, measuring 3.3 x 2.5 cm, with very gradual progressive peripheral nodular enhancement. 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 3. An additional lesion in the posterior right lobe of the liver, hepatic segment VI,  measures 0.8 cm and is too small to characterize although statistically likely a subcentimeter cyst or hemangioma. 4. There is a lobulated fluid signal, nonenhancing lesion of the pancreatic head immediately adjacent to the central common bile duct measuring 1.5 cm. 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 No further follow-up is required. This recommendation follows ACR consensus guidelines: Management of Incidental Pancreatic Cysts: A White Paper of the ACR Incidental Findings Committee. [place] 2637;85:[id number]. Electronically Signed   By: Eddie Candle M.D.   On: 01/21/2020 13:57   DG CHEST PORT 1 VIEW  Result Date: 01/23/2020 CLINICAL DATA:  New onset wheezing and shortness of breath  EXAM: PORTABLE CHEST 1 VIEW COMPARISON:  10/26/2016 FINDINGS: Post median sternotomy for CABG. Stable heart size. Hilar structures are normal. Lungs are clear.  Question small RIGHT effusion. Visualized skeletal structures on limited assessment are unremarkable. IMPRESSION: Question small RIGHT effusion.  No sign of consolidation. Electronically Signed   By: Zetta Bills M.D.   On: 01/23/2020 12:47   DG Epidural/Nerve Root  Result Date: 01/13/2020 CLINICAL DATA:  Connective tissue a disc stenosis of intervertebral foramina of the lumbar region. Lumbar fusion L3-4 and L4-5. Transitional anatomy. Adjacent level disease with a broad-based disc protrusion at L2-3 FLUOROSCOPY TIME:  Radiation Exposure Index (as provided by the fluoroscopic device): 43.11 uGy*m2 PROCEDURE: SELECTIVE NERVE ROOT AND TRANSFORAMINAL EPIDURAL STEROID INJECTION: A transforaminal approach was performed on the right at the L2-3 foramen. The overlying skin was cleansed and anesthetized. A 22 gauge spinal needle was advanced into the foramen from a lateral oblique approach. Injection of 2cc of Isovue-M 200 outlined the nerve root and  extended into the epidural space. No vascular or intrathecal spread is evident. I then injected 60 mg of Depo-Medrol and 0.75 ml of 1% lidocaine. The patient tolerated the procedure without evidence for complication. A transforaminal approach was performed on the left at the L2-3 foramen. The overlying skin was cleansed and anesthetized. A 22 gauge spinal needle was advanced into the foramen from a lateral oblique approach. Injection of 2cc of Isovue-M 200 outlined the nerve root and extended into the epidural space. No vascular or intrathecal spread is evident. I then injected 60 mg of Depo-Medrol and 0.75 ml of 1% lidocaine. The patient tolerated the procedure without evidence for complication. The patient was observed for 20 minutes prior to discharge in stable neurologic condition. IMPRESSION: Technically successful selective nerve root block and transforaminal epidural steroid injection bilaterally at the L2-3 foramina. Electronically Signed   By: San Morelle M.D.   On: 01/13/2020 15:29   MR ABDOMEN MRCP W WO CONTAST  Result Date: 01/21/2020 CLINICAL DATA:  Evaluate liver and pancreas lesions EXAM: MRI ABDOMEN WITHOUT AND WITH CONTRAST (INCLUDING MRCP) TECHNIQUE: Multiplanar multisequence MR imaging of the abdomen was performed both before and after the administration of intravenous contrast. Heavily T2-weighted images of the biliary and pancreatic ducts were obtained, and three-dimensional MRCP images were rendered by post processing. CONTRAST:  61mL GADAVIST GADOBUTROL 1 MMOL/ML IV SOLN COMPARISON:  Same day CT abdomen pelvis, 01/21/2020, 04/08/2016, 10/29/2007 FINDINGS: Examination is generally limited by extensive breath motion artifact throughout. Lower chest: Trace pleural effusions. Hepatobiliary: There is a subcapsular mass in the inferior tip of the right lobe of the liver, hepatic segment VI, measuring 3.3 x 2.5 cm, with slight heterogeneous internal T2 hyperintensity, diffusion  restriction, and demonstrating very gradual progressive peripheral nodular enhancement (series 19, image 49). An additional lesion in the posterior right lobe of the liver, hepatic segment VI, measures 0.8 cm and is too small to characterize although likely a subcentimeter cyst or hemangioma. Multiple small gallstones in the dependent gallbladder. Pancreas: There is a lobulated fluid signal, nonenhancing lesion of the pancreatic head immediately adjacent to the central common bile duct measuring 1.5 x 1.4 cm (series 22, image 25, series 4, image 22). No pancreatic ductal dilatation. Spleen:  Within normal limits in size and appearance. Adrenals/Urinary Tract: No masses identified. No evidence of hydronephrosis. Stomach/Bowel: Visualized portions within the abdomen are unremarkable. Vascular/Lymphatic: No pathologically enlarged lymph nodes identified. No abdominal aortic aneurysm demonstrated. Other:  None. Musculoskeletal: No suspicious bone lesions identified.  IMPRESSION: 1. Examination is limited by extensive breath motion artifact throughout. 2. There is a subcapsular mass in the inferior tip of the right lobe of the liver, hepatic segment VI, measuring 3.3 x 2.5 cm, with very gradual progressive peripheral nodular enhancement. 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 3. An additional lesion in the posterior right lobe of the liver, hepatic segment VI, measures 0.8 cm and is too small to characterize although statistically likely a subcentimeter cyst or hemangioma. 4. There is a lobulated fluid signal, nonenhancing lesion of the pancreatic head immediately adjacent to the central common bile duct measuring 1.5 cm. 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 No further follow-up is required. This  recommendation follows ACR consensus guidelines: Management of Incidental Pancreatic Cysts: A White Paper of the ACR Incidental Findings Committee. [place] 8341;96:[id number]. Electronically Signed   By: Eddie Candle M.D.   On: 01/21/2020 13:57   ECHOCARDIOGRAM COMPLETE  Result Date: 01/23/2020    ECHOCARDIOGRAM REPORT   Patient Name:   ABSHIR PAOLINI Date of Exam: 01/23/2020 Medical Rec #:  [id number]    Height:       66.0 in Accession #:    [id number]   Weight:       162.0 lb Date of Birth:  [birth year]    BSA:          1.829 m Patient Age:    79 years     BP:           128/92 mmHg Patient Gender: M            HR:           97 bpm. Exam Location:  [facility] Procedure: 2D Echo Indications:    Bacteremia 790.7 / R78.81  History:        Patient has prior history of Echocardiogram examinations, most                 recent 04/12/2011. Arrythmias:Atrial Fibrillation and Atrial                 Flutter; Risk Factors:Hypertension, Dyslipidemia and Non-Smoker.                 Prostate cancer.  Sonographer:    Leavy Cella RDCS (AE) Referring Phys: [place]  1. Left ventricular ejection fraction, by estimation, is 60 to 65%. The left ventricle has normal function. The left ventricle has no regional wall motion abnormalities. There is mild left ventricular hypertrophy. Left ventricular diastolic parameters are consistent with Grade I diastolic dysfunction (impaired relaxation).  2. Right ventricular systolic function is normal. The right ventricular size is normal. There is mildly elevated pulmonary artery systolic pressure.  3. The mitral valve is normal in structure. No evidence of mitral valve regurgitation. No evidence of mitral stenosis.  4. The aortic valve is tricuspid. Aortic valve regurgitation is not visualized. No aortic stenosis is present.  5. The inferior vena cava is normal in size with greater than 50% respiratory variability, suggesting right atrial pressure of 3 mmHg.  FINDINGS  Left Ventricle: Left ventricular ejection fraction, by estimation, is 60 to 65%. The left ventricle has normal function. The left ventricle has no regional wall motion abnormalities. The left ventricular internal cavity size was normal in size. There  is  mild left ventricular hypertrophy. Left ventricular diastolic parameters are consistent with Grade I diastolic dysfunction (impaired relaxation). Normal left ventricular filling pressure. Right Ventricle: The right ventricular size is normal. No increase in right ventricular wall thickness. Right ventricular systolic function is normal. There is mildly elevated pulmonary artery systolic pressure. The tricuspid regurgitant velocity is 2.81  m/s, and with an assumed right atrial pressure of 10 mmHg, the estimated right ventricular systolic pressure is 41.7 mmHg. Left Atrium: Left atrial size was normal in size. Right Atrium: Right atrial size was normal in size. Pericardium: There is no evidence of pericardial effusion. Mitral Valve: The mitral valve is normal in structure. No evidence of mitral valve regurgitation. No evidence of mitral valve stenosis. Tricuspid Valve: The tricuspid valve is normal in structure. Tricuspid valve regurgitation is mild . No evidence of tricuspid stenosis. Aortic Valve: The aortic valve is tricuspid. Aortic valve regurgitation is not visualized. No aortic stenosis is present. Aortic valve mean gradient measures 3.1 mmHg. Aortic valve peak gradient measures 6.2 mmHg. Aortic valve area, by VTI measures 2.29 cm. Pulmonic Valve: The pulmonic valve was not well visualized. Pulmonic valve regurgitation is not visualized. No evidence of pulmonic stenosis. Aorta: The aortic root is normal in size and structure. Pulmonary Artery: Mild pulmonary HTN, PASP is 35 mmHg. Venous: The inferior vena cava is normal in size with greater than 50% respiratory variability, suggesting right atrial pressure of 3 mmHg. IAS/Shunts: No atrial level  shunt detected by color flow Doppler.  LEFT VENTRICLE PLAX 2D LVIDd:         3.76 cm  Diastology LVIDs:         2.57 cm  LV e' lateral:   10.10 cm/s LV PW:         1.10 cm  LV E/e' lateral: 6.5 LV IVS:        1.12 cm  LV e' medial:    7.51 cm/s LVOT diam:     1.80 cm  LV E/e' medial:  8.7 LV SV:         45 LV SV Index:   25 LVOT Area:     2.54 cm  RIGHT VENTRICLE RV S prime:     14.60 cm/s TAPSE (M-mode): 1.5 cm LEFT ATRIUM           Index       RIGHT ATRIUM          Index LA diam:      3.20 cm 1.75 cm/m  RA Area:     9.39 cm LA Vol (A2C): 28.4 ml 15.53 ml/m RA Volume:   20.50 ml 11.21 ml/m LA Vol (A4C): 34.5 ml 18.87 ml/m  AORTIC VALVE AV Area (Vmax):    2.10 cm AV Area (Vmean):   2.08 cm AV Area (VTI):     2.29 cm AV Vmax:           124.23 cm/s AV Vmean:          83.099 cm/s AV VTI:            0.196 m AV Peak Grad:      6.2 mmHg AV Mean Grad:      3.1 mmHg LVOT Vmax:         102.35 cm/s LVOT Vmean:        68.044 cm/s LVOT VTI:          0.176 m LVOT/AV VTI ratio: 0.90  AORTA Ao Root diam: 3.30 cm MITRAL VALVE  TRICUSPID VALVE MV Area (PHT): 3.68 cm    TR Peak grad:   31.6 mmHg MV Decel Time: 206 msec    TR Vmax:        281.00 cm/s MV E velocity: 65.50 cm/s MV A velocity: 77.10 cm/s  SHUNTS MV E/A ratio:  0.85        Systemic VTI:  0.18 m                            Systemic Diam: 1.80 cm Carlyle Dolly MD Electronically signed by Carlyle Dolly MD Signature Date/Time: 01/23/2020/11:50:45 AM    Final    CT RENAL STONE STUDY  Result Date: 01/21/2020 CLINICAL DATA:  Back pain, dysuria EXAM: CT ABDOMEN AND PELVIS WITHOUT CONTRAST TECHNIQUE: Multidetector CT imaging of the abdomen and pelvis was performed following the standard protocol without IV contrast. COMPARISON:  03/08/2016 FINDINGS: Lower chest: 5 mm noncalcified posterior right lower lobe pulmonary nodule (series 4, image 40), new from prior. Mild right greater than left bibasilar subsegmental atelectasis. Coronary artery  calcifications. Hepatobiliary: Slightly ill-defined 3.0 cm lesion within the inferior aspect of the right hepatic lobe (series 2, image 38), new from prior. There is at least 1 additional subcentimeter indeterminate lesions within the liver (series 2, image 28), not definitively seen on prior. Cholelithiasis. Gallbladder is mildly distended. No biliary dilatation. Pancreas: Subtle area of relative low attenuation within the posterior aspect of the pancreatic head which appears new from prior (series 2, images 28-29). No peripancreatic stranding. No pancreatic ductal dilatation. No peripancreatic fluid. Spleen: Normal in size without focal abnormality. Adrenals/Urinary Tract: Adrenal glands are unremarkable. Kidneys are normal, without renal calculi, focal lesion, or hydronephrosis. Bladder is decompressed, limiting its evaluation. Stomach/Bowel: Stomach is within normal limits. Appendix not visualized, and may be surgically absent. Scattered colonic diverticulosis. No evidence of bowel wall thickening, distention, or inflammatory changes. Vascular/Lymphatic: Aortoiliac atherosclerotic calcification without aneurysm. Mild ectasia of the right common iliac artery, unchanged. No enlarged abdominal or pelvic lymph nodes. Reproductive: Prostatectomy. Other: No abdominal wall hernia or abnormality. No abdominopelvic ascites. Musculoskeletal: L3-5 PLIF. No acute osseous findings. The sclerotic osseous lesions seen on prior CT 03/08/2016 have resolved. IMPRESSION: 1. Subtle area of relative low attenuation within the posterior aspect of the pancreatic head which appears new from prior. Pancreatic mass is not excluded. When the patient is clinically stable and able to follow directions and hold their breath (preferably as an outpatient) further evaluation with dedicated contrast-enhanced pancreatic protocol MRI of the abdomen should be considered. 2. New slightly ill-defined 3.0 cm lesion within the inferior aspect of the  right hepatic lobe with at least 1 additional subcentimeter indeterminate lesions within the liver. Findings are nonspecific but could reflect hepatic metastatic disease. These findings can also be further evaluated on the previously recommended MRI. 3. 5 mm noncalcified right lower lobe pulmonary nodule, new from prior. 4. The sclerotic osseous lesions seen on prior CT 03/08/2016 have resolved. 5. Cholelithiasis without evidence of acute cholecystitis. 6. Colonic diverticulosis without evidence of acute diverticulitis. 7. Aortic atherosclerosis. (ICD10-I70.0). Electronically Signed   By: Davina Poke D.O.   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 Lumbar fusion L3-4 and L4-5. Transitional anatomy. Adjacent level disease with a broad-based disc protrusion at L2-3 FLUOROSCOPY TIME:  Radiation Exposure Index (as  provided by the fluoroscopic device): 43.11 uGy*m2 PROCEDURE: SELECTIVE NERVE ROOT AND TRANSFORAMINAL EPIDURAL STEROID INJECTION: A transforaminal approach was performed on the right at the L2-3 foramen. The overlying skin was cleansed and anesthetized. A 22 gauge spinal needle was advanced into the foramen from a lateral oblique approach. Injection of 2cc of Isovue-M 200 outlined the nerve root and extended into the epidural space. No vascular or intrathecal spread is evident. I then injected 60 mg of Depo-Medrol and 0.75 ml of 1% lidocaine. The patient tolerated the procedure without evidence for complication. A transforaminal approach was performed on the left at the L2-3 foramen. The overlying skin was cleansed and anesthetized. A 22 gauge spinal needle was advanced into the foramen from a lateral oblique approach. Injection of 2cc of Isovue-M 200 outlined the nerve root and extended into the epidural space. No vascular or intrathecal spread is  evident. I then injected 60 mg of Depo-Medrol and 0.75 ml of 1% lidocaine. The patient tolerated the procedure without evidence for complication. The patient was observed for 20 minutes prior to discharge in stable neurologic condition. IMPRESSION: Technically successful selective nerve root block and transforaminal epidural steroid injection bilaterally at the L2-3 foramina. Electronically Signed   By: San Morelle M.D.   On: 01/13/2020 15:29      Subjective: Pt says that he feels much better and he wants to go home.  He denies chest pain and palpitations.  He denies SOB.  He says he feels well and would recover better at home.   Discharge Exam: Vitals:   01/27/20 0544 01/27/20 0843  BP: (!) 152/83   Pulse: 65   Resp: 18   Temp: 98.4 F (36.9 C)   SpO2: 96% 99%   Vitals:   01/26/20 2053 01/26/20 2054 01/27/20 0544 01/27/20 0843  BP: (!) 141/79  (!) 152/83   Pulse: 64 68 65   Resp: 18  18   Temp: 98.9 F (37.2 C)  98.4 F (36.9 C)   TempSrc: Oral  Oral   SpO2: 97%  96% 99%  Weight:      Height:       General: Pt is alert, awake, not in acute distress Cardiovascular: irregularly irregular, S1/S2 +, no rubs, no gallops Respiratory: CTA bilaterally, no wheezing, no rhonchi Abdominal: Soft, NT, ND, bowel sounds + Extremities: no edema, no cyanosis   The results of significant diagnostics from this hospitalization (including imaging, microbiology, ancillary and laboratory) are listed below for reference.     Microbiology: Recent Results (from the past 240 hour(s))  Culture, blood (single) w Reflex to ID Panel     Status: Abnormal   Collection Time: 01/21/20  9:04 AM   Specimen: BLOOD  Result Value Ref Range Status   Specimen Description   Final    BLOOD RIGHT ANTECUBITAL Performed at [facility], [address] [place], [place] [address]    Special Requests   Final    BOTTLES DRAWN AEROBIC AND ANAEROBIC Blood Culture adequate volume Performed at [facility], [address], [place], [place] [address]    Culture  Setup Time   Final    GRAM POSITIVE COCCI ANAEROBIC BOTTLE ONLY Gram Stain Report Called to,Read Back By and Verified With: SHELTON @ 8101 ON [id number] BY HENDERSON L. AEROBIC BOTTLE ALSO Performed at [facility], [address] [place], [place] [address]    Culture STREPTOCOCCUS INTERMEDIUS (A)  Final   Report Status 01/25/2020 FINAL  Final   Organism ID, Bacteria  STREPTOCOCCUS INTERMEDIUS  Final      Susceptibility   Streptococcus intermedius - MIC*    PENICILLIN <=0.06 SENSITIVE Sensitive     CEFTRIAXONE 0.25 SENSITIVE Sensitive     ERYTHROMYCIN 2 RESISTANT Resistant     LEVOFLOXACIN 0.5 SENSITIVE Sensitive     VANCOMYCIN 0.5 SENSITIVE Sensitive     * STREPTOCOCCUS INTERMEDIUS  Blood Culture ID Panel (Reflexed)     Status: Abnormal   Collection Time: 01/21/20  9:04 AM  Result Value Ref Range Status   Enterococcus species NOT DETECTED NOT DETECTED Final   Listeria monocytogenes NOT DETECTED NOT DETECTED Final   Staphylococcus species NOT DETECTED NOT DETECTED Final   Staphylococcus aureus (BCID) NOT DETECTED NOT DETECTED Final   Streptococcus species DETECTED (A) NOT DETECTED Final    Comment: Not Enterococcus species, Streptococcus agalactiae, Streptococcus pyogenes, or Streptococcus pneumoniae. CRITICAL RESULT CALLED TO, READ BACK BY AND VERIFIED WITH: Gloris [place] PharmD 15:45 01/22/20 ([email or web address])    Streptococcus agalactiae NOT DETECTED NOT DETECTED Final   Streptococcus pneumoniae NOT DETECTED NOT DETECTED Final   Streptococcus pyogenes NOT DETECTED NOT DETECTED Final   Acinetobacter baumannii NOT DETECTED NOT DETECTED Final   Enterobacteriaceae species NOT DETECTED NOT DETECTED Final   Enterobacter cloacae complex NOT DETECTED NOT DETECTED Final   Escherichia coli NOT DETECTED NOT DETECTED Final   Klebsiella oxytoca NOT DETECTED NOT DETECTED Final   Klebsiella pneumoniae NOT DETECTED NOT DETECTED Final   Proteus species  NOT DETECTED NOT DETECTED Final   Serratia marcescens NOT DETECTED NOT DETECTED Final   Haemophilus influenzae NOT DETECTED NOT DETECTED Final   Neisseria meningitidis NOT DETECTED NOT DETECTED Final   Pseudomonas aeruginosa NOT DETECTED NOT DETECTED Final   Candida albicans NOT DETECTED NOT DETECTED Final   Candida glabrata NOT DETECTED NOT DETECTED Final   Candida krusei NOT DETECTED NOT DETECTED Final   Candida parapsilosis NOT DETECTED NOT DETECTED Final   Candida tropicalis NOT DETECTED NOT DETECTED Final    Comment: Performed at [facility], [place]. [address] [place], [place] [address]  Urine Culture     Status: None   Collection Time: 01/21/20 10:38 AM   Specimen: Urine, Clean Catch  Result Value Ref Range Status   Specimen Description   Final    URINE, CLEAN CATCH Performed at [facility], [address], [place], [place] [address]    Special Requests   Final    Normal Performed at [facility], [address] [place], [place] [address]    Culture   Final    NO GROWTH Performed at [facility], [place] [address] [place], [place] [address]    Report Status 01/23/2020 FINAL  Final  SARS Coronavirus 2 by RT PCR (hospital order, performed in [facility] hospital lab) Nasopharyngeal Nasopharyngeal Swab     Status: None   Collection Time: 01/21/20  3:14 PM   Specimen: Nasopharyngeal Swab  Result Value Ref Range Status   SARS Coronavirus 2 NEGATIVE NEGATIVE Final    Comment: (NOTE) SARS-CoV-2 target nucleic acids are NOT DETECTED.  The SARS-CoV-2 RNA is generally detectable in upper and lower respiratory specimens during the acute phase of infection. The lowest concentration of SARS-CoV-2 viral copies this assay can detect is 250 copies / mL. A negative result does not preclude SARS-CoV-2 infection and should not be used as the sole basis for treatment or other patient management decisions.  A negative result may occur with improper specimen collection / handling,  submission  of specimen other than nasopharyngeal swab, presence of viral mutation(s) within the areas targeted by this assay, and inadequate number of viral copies (<250 copies / mL). A negative result must be combined with clinical observations, patient history, and epidemiological information.  Fact Sheet for Patients:   StrictlyIdeas.no  Fact Sheet for Healthcare Providers: BankingDealers.co.za  This test is not yet approved or  cleared by the [place] FDA and has been authorized for detection and/or diagnosis of SARS-CoV-2 by FDA under an Emergency Use Authorization (EUA).  This EUA will remain in effect (meaning this test can be used) for the duration of the COVID-19 declaration under Section 564(b)(1) of the Act, 21 U.S.C. section 360bbb-3(b)(1), unless the authorization is terminated or revoked sooner.  Performed at [facility], [address], [place], [place] [address]   MRSA PCR Screening     Status: None   Collection Time: 01/22/20  3:31 AM   Specimen: Nasal Mucosa; Nasopharyngeal  Result Value Ref Range Status   MRSA by PCR NEGATIVE NEGATIVE Final    Comment:        The GeneXpert MRSA Assay (FDA approved for NASAL specimens only), is one component of a comprehensive MRSA colonization surveillance program. It is not intended to diagnose MRSA infection nor to guide or monitor treatment for MRSA infections. Performed at [facility], [address] [place], [place] [address]      Labs: BNP (last 3 results) No results for input(s): BNP in the last 8760 hours. Basic Metabolic Panel: Recent Labs  Lab 01/22/20 0430 01/23/20 0421 01/24/20 0416 01/25/20 0402 01/27/20 0503  NA 137 138 136 139 139  K 4.2 3.4* 3.7 3.6 4.0  CL 103 108 105 108 104  CO2 21* 23 22 22 25   GLUCOSE 90 143* 113* 101* 93  BUN 30* 21 22 26* 28*  CREATININE 1.38* 1.33* 1.42* 1.36* 1.33*  CALCIUM 8.6* 8.3* 8.2* 8.8* 8.7*  MG  --  1.7  2.3  --   --    Liver Function Tests: Recent Labs  Lab 01/22/20 0430 01/23/20 0421 01/24/20 0416 01/25/20 0402 01/27/20 0503  AST 153* 65* 43* 43* 61*  ALT 141* 93* 76* 69* 92*  ALKPHOS 75 68 79 78 82  BILITOT 0.8 0.7 0.4 0.5 0.5  PROT 5.7* 5.1* 5.4* 5.3* 5.5*  ALBUMIN 2.3* 2.1* 2.2* 2.2* 2.5*   Recent Labs  Lab 01/21/20 0838  LIPASE 24   No results for input(s): AMMONIA in the last 168 hours. CBC: Recent Labs  Lab 01/23/20 0421 01/24/20 0416 01/25/20 0402 01/26/20 0834 01/27/20 0503  WBC 10.5 8.7 5.8 5.1 5.3  NEUTROABS 9.2* 7.2 4.0  --   --   HGB 10.7* 10.8* 11.2* 11.5* 11.4*  HCT 33.3* 33.1* 34.5* 35.5* 36.2*  MCV 96.2 96.2 96.1 95.9 97.1  PLT 147* 154 180 244 269   Cardiac Enzymes: No results for input(s): CKTOTAL, CKMB, CKMBINDEX, TROPONINI in the last 168 hours. BNP: Invalid input(s): POCBNP CBG: No results for input(s): GLUCAP in the last 168 hours. D-Dimer No results for input(s): DDIMER in the last 72 hours. Hgb A1c No results for input(s): HGBA1C in the last 72 hours. Lipid Profile No results for input(s): CHOL, HDL, LDLCALC, TRIG, CHOLHDL, LDLDIRECT in the last 72 hours. Thyroid function studies No results for input(s): TSH, T4TOTAL, T3FREE, THYROIDAB in the last 72 hours.  Invalid input(s): FREET3 Anemia work up No results for input(s): VITAMINB12, FOLATE, FERRITIN, TIBC, IRON, RETICCTPCT in the last 72 hours. Urinalysis  Component Value Date/Time   COLORURINE AMBER (A) 01/21/2020 1038   APPEARANCEUR CLOUDY (A) 01/21/2020 1038   LABSPEC 1.021 01/21/2020 1038   PHURINE 5.0 01/21/2020 1038   GLUCOSEU NEGATIVE 01/21/2020 1038   HGBUR SMALL (A) 01/21/2020 1038   BILIRUBINUR NEGATIVE 01/21/2020 1038   KETONESUR NEGATIVE 01/21/2020 1038   PROTEINUR 100 (A) 01/21/2020 1038   UROBILINOGEN 0.2 10/29/2007 2036   NITRITE NEGATIVE 01/21/2020 1038   LEUKOCYTESUR NEGATIVE 01/21/2020 1038   Sepsis Labs Invalid input(s): PROCALCITONIN,  WBC,   LACTICIDVEN Microbiology Recent Results (from the past 240 hour(s))  Culture, blood (single) w Reflex to ID Panel     Status: Abnormal   Collection Time: 01/21/20  9:04 AM   Specimen: BLOOD  Result Value Ref Range Status   Specimen Description   Final    BLOOD RIGHT ANTECUBITAL Performed at [facility], [address] [place], [place] [address]    Special Requests   Final    BOTTLES DRAWN AEROBIC AND ANAEROBIC Blood Culture adequate volume Performed at [facility], [address] [place], [place] [address]    Culture  Setup Time   Final    [place] ONLY Gram Stain Report Called to,Read Back By and Verified With: SHELTON @ 9163 ON [id number] BY HENDERSON L. AEROBIC BOTTLE ALSO Performed at [facility], [address], [place], [place] [address]    Culture STREPTOCOCCUS INTERMEDIUS (A)  Final   Report Status 01/25/2020 FINAL  Final   Organism ID, Bacteria STREPTOCOCCUS INTERMEDIUS  Final      Susceptibility   Streptococcus intermedius - MIC*    PENICILLIN <=0.06 SENSITIVE Sensitive     CEFTRIAXONE 0.25 SENSITIVE Sensitive     ERYTHROMYCIN 2 RESISTANT Resistant     LEVOFLOXACIN 0.5 SENSITIVE Sensitive     VANCOMYCIN 0.5 SENSITIVE Sensitive     * STREPTOCOCCUS INTERMEDIUS  Blood Culture ID Panel (Reflexed)     Status: Abnormal   Collection Time: 01/21/20  9:04 AM  Result Value Ref Range Status   Enterococcus species NOT DETECTED NOT DETECTED Final   Listeria monocytogenes NOT DETECTED NOT DETECTED Final   Staphylococcus species NOT DETECTED NOT DETECTED Final   Staphylococcus aureus (BCID) NOT DETECTED NOT DETECTED Final   Streptococcus species DETECTED (A) NOT DETECTED Final    Comment: Not Enterococcus species, Streptococcus agalactiae, Streptococcus pyogenes, or Streptococcus pneumoniae. CRITICAL RESULT CALLED TO, READ BACK BY AND VERIFIED WITH: Gloris Manchester PharmD 15:45 01/22/20 ([email or web address])    Streptococcus agalactiae NOT DETECTED NOT DETECTED Final    Streptococcus pneumoniae NOT DETECTED NOT DETECTED Final   Streptococcus pyogenes NOT DETECTED NOT DETECTED Final   Acinetobacter baumannii NOT DETECTED NOT DETECTED Final   Enterobacteriaceae species NOT DETECTED NOT DETECTED Final   Enterobacter cloacae complex NOT DETECTED NOT DETECTED Final   Escherichia coli NOT DETECTED NOT DETECTED Final   Klebsiella oxytoca NOT DETECTED NOT DETECTED Final   Klebsiella pneumoniae NOT DETECTED NOT DETECTED Final   Proteus species NOT DETECTED NOT DETECTED Final   Serratia marcescens NOT DETECTED NOT DETECTED Final   Haemophilus influenzae NOT DETECTED NOT DETECTED Final   Neisseria meningitidis NOT DETECTED NOT DETECTED Final   Pseudomonas aeruginosa NOT DETECTED NOT DETECTED Final   Candida albicans NOT DETECTED NOT DETECTED Final   Candida glabrata NOT DETECTED NOT DETECTED Final   Candida krusei NOT DETECTED NOT DETECTED Final   Candida parapsilosis NOT DETECTED NOT DETECTED Final   Candida tropicalis NOT DETECTED NOT DETECTED Final  Comment: Performed at [facility], [place] [address] [place] [address], [place], [place] [address]  Urine Culture     Status: None   Collection Time: 01/21/20 10:38 AM   Specimen: Urine, Clean Catch  Result Value Ref Range Status   Specimen Description   Final    URINE, CLEAN CATCH Performed at [facility], [address] [place], [place] [address]    Special Requests   Final    Normal Performed at [facility], [address], [place], [place] [address]    Culture   Final    NO GROWTH Performed at [facility], [place] [address], [place], [place] [address]    Report Status 01/23/2020 FINAL  Final  SARS Coronavirus 2 by RT PCR (hospital order, performed in [facility] hospital lab) Nasopharyngeal Nasopharyngeal Swab     Status: None   Collection Time: 01/21/20  3:14 PM   Specimen: Nasopharyngeal Swab  Result Value Ref Range Status   SARS Coronavirus 2 NEGATIVE NEGATIVE Final    Comment: (NOTE) SARS-CoV-2 target  nucleic acids are NOT DETECTED.  The SARS-CoV-2 RNA is generally detectable in upper and lower respiratory specimens during the acute phase of infection. The lowest concentration of SARS-CoV-2 viral copies this assay can detect is 250 copies / mL. A negative result does not preclude SARS-CoV-2 infection and should not be used as the sole basis for treatment or other patient management decisions.  A negative result may occur with improper specimen collection / handling, submission of specimen other than nasopharyngeal swab, presence of viral mutation(s) within the areas targeted by this assay, and inadequate number of viral copies (<250 copies / mL). A negative result must be combined with clinical observations, patient history, and epidemiological information.  Fact Sheet for Patients:   StrictlyIdeas.no  Fact Sheet for Healthcare Providers: BankingDealers.co.za  This test is not yet approved or  cleared by the [place] FDA and has been authorized for detection and/or diagnosis of SARS-CoV-2 by FDA under an Emergency Use Authorization (EUA).  This EUA will remain in effect (meaning this test can be used) for the duration of the COVID-19 declaration under Section 564(b)(1) of the Act, 21 U.S.C. section 360bbb-3(b)(1), unless the authorization is terminated or revoked sooner.  Performed at [facility], [address], [place], [place] [address]   MRSA PCR Screening     Status: None   Collection Time: 01/22/20  3:31 AM   Specimen: Nasal Mucosa; Nasopharyngeal  Result Value Ref Range Status   MRSA by PCR NEGATIVE NEGATIVE Final    Comment:        The GeneXpert MRSA Assay (FDA approved for NASAL specimens only), is one component of a comprehensive MRSA colonization surveillance program. It is not intended to diagnose MRSA infection nor to guide or monitor treatment for MRSA infections. Performed at [facility] [address], [place], [place] [address]    Time coordinating discharge: 35 minutes   SIGNED:  Irwin Brakeman, MD  [facility] 01/27/2020, 11:14 AM How to contact the [facility] Attending or Consulting provider [place] or covering provider during after hours [place], for this patient?  1. Check the care team in [facility] and look for a) attending/consulting [facility] provider listed and b) the [facility] team listed 2. Log into [email or web address] and use [facility]'s universal password to access. If you do not have the password, please contact the hospital operator. 3. Locate the [facility] provider you are looking for under [facility] and  page to a number that you can be directly reached. 4. If you still have difficulty reaching the provider, please page the [facility] (Director on Call) for the Hospitalists listed on amion for assistance.

## 2020-01-28 ENCOUNTER — Other Ambulatory Visit (INDEPENDENT_AMBULATORY_CARE_PROVIDER_SITE_OTHER): Payer: Self-pay | Admitting: *Deleted

## 2020-01-28 DIAGNOSIS — R160 Hepatomegaly, not elsewhere classified: Secondary | ICD-10-CM | POA: Diagnosis not present

## 2020-01-28 DIAGNOSIS — A419 Sepsis, unspecified organism: Secondary | ICD-10-CM | POA: Diagnosis not present

## 2020-01-28 DIAGNOSIS — I959 Hypotension, unspecified: Secondary | ICD-10-CM | POA: Diagnosis not present

## 2020-01-28 DIAGNOSIS — I2510 Atherosclerotic heart disease of native coronary artery without angina pectoris: Secondary | ICD-10-CM | POA: Diagnosis not present

## 2020-01-28 DIAGNOSIS — C7951 Secondary malignant neoplasm of bone: Secondary | ICD-10-CM | POA: Diagnosis not present

## 2020-01-28 DIAGNOSIS — Z7951 Long term (current) use of inhaled steroids: Secondary | ICD-10-CM | POA: Diagnosis not present

## 2020-01-28 DIAGNOSIS — M359 Systemic involvement of connective tissue, unspecified: Secondary | ICD-10-CM | POA: Diagnosis not present

## 2020-01-28 DIAGNOSIS — N1832 Chronic kidney disease, stage 3b: Secondary | ICD-10-CM | POA: Diagnosis not present

## 2020-01-28 DIAGNOSIS — I129 Hypertensive chronic kidney disease with stage 1 through stage 4 chronic kidney disease, or unspecified chronic kidney disease: Secondary | ICD-10-CM | POA: Diagnosis not present

## 2020-01-28 DIAGNOSIS — I4892 Unspecified atrial flutter: Secondary | ICD-10-CM | POA: Diagnosis not present

## 2020-01-28 DIAGNOSIS — C61 Malignant neoplasm of prostate: Secondary | ICD-10-CM | POA: Diagnosis not present

## 2020-01-28 DIAGNOSIS — E785 Hyperlipidemia, unspecified: Secondary | ICD-10-CM | POA: Diagnosis not present

## 2020-01-28 DIAGNOSIS — R6521 Severe sepsis with septic shock: Secondary | ICD-10-CM | POA: Diagnosis not present

## 2020-01-28 DIAGNOSIS — M5442 Lumbago with sciatica, left side: Secondary | ICD-10-CM | POA: Diagnosis not present

## 2020-01-28 DIAGNOSIS — G8929 Other chronic pain: Secondary | ICD-10-CM | POA: Diagnosis not present

## 2020-01-28 DIAGNOSIS — M5441 Lumbago with sciatica, right side: Secondary | ICD-10-CM | POA: Diagnosis not present

## 2020-01-28 DIAGNOSIS — M6281 Muscle weakness (generalized): Secondary | ICD-10-CM | POA: Diagnosis not present

## 2020-01-28 DIAGNOSIS — K769 Liver disease, unspecified: Secondary | ICD-10-CM

## 2020-01-28 DIAGNOSIS — N179 Acute kidney failure, unspecified: Secondary | ICD-10-CM | POA: Diagnosis not present

## 2020-01-28 DIAGNOSIS — Z7901 Long term (current) use of anticoagulants: Secondary | ICD-10-CM | POA: Diagnosis not present

## 2020-01-28 DIAGNOSIS — K869 Disease of pancreas, unspecified: Secondary | ICD-10-CM | POA: Diagnosis not present

## 2020-01-28 DIAGNOSIS — M1990 Unspecified osteoarthritis, unspecified site: Secondary | ICD-10-CM | POA: Diagnosis not present

## 2020-01-31 ENCOUNTER — Encounter (HOSPITAL_COMMUNITY): Payer: Self-pay

## 2020-01-31 DIAGNOSIS — A419 Sepsis, unspecified organism: Secondary | ICD-10-CM | POA: Diagnosis not present

## 2020-01-31 DIAGNOSIS — I959 Hypotension, unspecified: Secondary | ICD-10-CM | POA: Diagnosis not present

## 2020-01-31 DIAGNOSIS — N179 Acute kidney failure, unspecified: Secondary | ICD-10-CM | POA: Diagnosis not present

## 2020-01-31 DIAGNOSIS — R6521 Severe sepsis with septic shock: Secondary | ICD-10-CM | POA: Diagnosis not present

## 2020-01-31 DIAGNOSIS — M5441 Lumbago with sciatica, right side: Secondary | ICD-10-CM | POA: Diagnosis not present

## 2020-01-31 DIAGNOSIS — M6281 Muscle weakness (generalized): Secondary | ICD-10-CM | POA: Diagnosis not present

## 2020-01-31 NOTE — Progress Notes (Signed)
  Samuel Gregory Male, 79 y.o., 07/19/1941 MRN:  [id number] Phone:  [phone] Samuel Gregory) PCP:  Celene Squibb, MD Primary Cvg:  Medicare/Medicare Part A And B Next Appt With Radiology ([facility]-US 2) 02/07/2020 at 1:00 PM  RE: Blood Thinner/Biopsy Received: Today Message Details  Rehman, Mechele Dawley, MD  [facility] [place] E Yes it is ok to hold Eliquis.  Cardiology aware that this would need to be done.   Rehman   Previous Messages  ----- Message -----  From: Lenore Cordia  Sent: 01/30/2020 10:04 AM EDT  To: Rogene Houston, MD  Subject: Blood Thinner/Biopsy               Hello Dr Laural Golden   Mr. Ollis is scheduled for a Biopsy  July 9th, pt needs to hold his Eliquis two  Days before.  Your permission is needed   Caiya Bettes  ----- Message -----  From: Sandi Mariscal, MD  Sent: 01/29/2020  5:06 PM EDT  To: Lenore Cordia  Subject: RE: Biopsy                    OK for US guided liver lesion Bx.   Abdominal MRI - 01/23/20 - image 49, series 19.   Cathren Harsh  ----- Message -----  From: Lenore Cordia  Sent: 01/29/2020  4:35 PM EDT  To: Ir Procedure Requests  Subject: Biopsy                      Procedure Requested: US LIver Biopsy    Reason for Procedure: Liver Lesion Right Lobe    Provider Requesting:Rehman, Mechele Dawley  Provider Telephone: [phone]    Other Info: Rad exam in Epic

## 2020-02-03 ENCOUNTER — Emergency Department (HOSPITAL_COMMUNITY): Payer: Medicare Other

## 2020-02-03 ENCOUNTER — Emergency Department (HOSPITAL_COMMUNITY)
Admission: EM | Admit: 2020-02-03 | Discharge: 2020-02-03 | Disposition: A | Discharge status: Home / Self Care | Payer: Medicare Other | Source: Home / Self Care | Attending: Emergency Medicine | Admitting: Emergency Medicine

## 2020-02-03 ENCOUNTER — Other Ambulatory Visit: Payer: Self-pay

## 2020-02-03 ENCOUNTER — Encounter (HOSPITAL_COMMUNITY): Payer: Self-pay

## 2020-02-03 DIAGNOSIS — Z91040 Latex allergy status: Secondary | ICD-10-CM | POA: Insufficient documentation

## 2020-02-03 DIAGNOSIS — A0472 Enterocolitis due to Clostridium difficile, not specified as recurrent: Secondary | ICD-10-CM | POA: Diagnosis not present

## 2020-02-03 DIAGNOSIS — I10 Essential (primary) hypertension: Secondary | ICD-10-CM | POA: Insufficient documentation

## 2020-02-03 DIAGNOSIS — Z7901 Long term (current) use of anticoagulants: Secondary | ICD-10-CM | POA: Insufficient documentation

## 2020-02-03 DIAGNOSIS — I2510 Atherosclerotic heart disease of native coronary artery without angina pectoris: Secondary | ICD-10-CM | POA: Insufficient documentation

## 2020-02-03 DIAGNOSIS — K5792 Diverticulitis of intestine, part unspecified, without perforation or abscess without bleeding: Secondary | ICD-10-CM | POA: Diagnosis not present

## 2020-02-03 DIAGNOSIS — R509 Fever, unspecified: Secondary | ICD-10-CM | POA: Insufficient documentation

## 2020-02-03 LAB — COMPREHENSIVE METABOLIC PANEL
ALT: 38 U/L (ref 0–44)
AST: 27 U/L (ref 15–41)
Albumin: 3.1 g/dL — ABNORMAL LOW (ref 3.5–5.0)
Alkaline Phosphatase: 77 U/L (ref 38–126)
Anion gap: 11 (ref 5–15)
BUN: 24 mg/dL — ABNORMAL HIGH (ref 8–23)
CO2: 26 mmol/L (ref 22–32)
Calcium: 9.6 mg/dL (ref 8.9–10.3)
Chloride: 100 mmol/L (ref 98–111)
Creatinine, Ser: 1.32 mg/dL — ABNORMAL HIGH (ref 0.61–1.24)
GFR calc Af Amer: 59 mL/min — ABNORMAL LOW (ref >60)
GFR calc non Af Amer: 51 mL/min — ABNORMAL LOW (ref >60)
Glucose, Bld: 97 mg/dL (ref 70–99)
Potassium: 3.8 mmol/L (ref 3.5–5.1)
Sodium: 137 mmol/L (ref 135–145)
Total Bilirubin: 0.8 mg/dL (ref 0.3–1.2)
Total Protein: 6.4 g/dL — ABNORMAL LOW (ref 6.5–8.1)

## 2020-02-03 LAB — URINALYSIS, ROUTINE W REFLEX MICROSCOPIC
Bacteria, UA: NONE SEEN
Bilirubin Urine: NEGATIVE
Glucose, UA: NEGATIVE mg/dL
Hgb urine dipstick: NEGATIVE
Ketones, ur: NEGATIVE mg/dL
Leukocytes,Ua: NEGATIVE
Nitrite: NEGATIVE
Protein, ur: 30 mg/dL — AB
Specific Gravity, Urine: 1.019 (ref 1.005–1.030)
pH: 5 (ref 5.0–8.0)

## 2020-02-03 LAB — CBC WITH DIFFERENTIAL/PLATELET
Abs Immature Granulocytes: 0.03 10*3/uL (ref 0.00–0.07)
Basophils Absolute: 0.1 10*3/uL (ref 0.0–0.1)
Basophils Relative: 1 %
Eosinophils Absolute: 0 10*3/uL (ref 0.0–0.5)
Eosinophils Relative: 0 %
HCT: 37.4 % — ABNORMAL LOW (ref 39.0–52.0)
Hemoglobin: 11.7 g/dL — ABNORMAL LOW (ref 13.0–17.0)
Immature Granulocytes: 0 %
Lymphocytes Relative: 17 %
Lymphs Abs: 1.7 10*3/uL (ref 0.7–4.0)
MCH: 30.8 pg (ref 26.0–34.0)
MCHC: 31.3 g/dL (ref 30.0–36.0)
MCV: 98.4 fL (ref 80.0–100.0)
Monocytes Absolute: 0.9 10*3/uL (ref 0.1–1.0)
Monocytes Relative: 9 %
Neutro Abs: 7 10*3/uL (ref 1.7–7.7)
Neutrophils Relative %: 73 %
Platelets: 375 10*3/uL (ref 150–400)
RBC: 3.8 MIL/uL — ABNORMAL LOW (ref 4.22–5.81)
RDW: 14.2 % (ref 11.5–15.5)
WBC: 9.7 10*3/uL (ref 4.0–10.5)
nRBC: 0 % (ref 0.0–0.2)

## 2020-02-03 LAB — LACTIC ACID, PLASMA
Lactic Acid, Venous: 2 mmol/L (ref 0.5–1.9)
Lactic Acid, Venous: 1 mmol/L (ref 0.5–1.9)

## 2020-02-03 LAB — LIPASE, BLOOD: Lipase: 53 U/L — ABNORMAL HIGH (ref 11–51)

## 2020-02-03 MED ORDER — SODIUM CHLORIDE 0.9 % IV BOLUS
500.0000 mL | Freq: Once | INTRAVENOUS | Status: AC
  Administered 2020-02-03: 500 mL via INTRAVENOUS

## 2020-02-03 NOTE — ED Provider Notes (Signed)
[facility] EMERGENCY DEPARTMENT Provider Note   CSN: [id number] Arrival date & time: 02/03/20  0947     History Chief Complaint  Patient presents with   Fever    Samuel Gregory is a 79 y.o. male.  Pt presents to the ED today with a fever (99.4).  Pt took a tylenol around 0830. He was admitted from 6/22 to 6/28 for strep sepsis.  He required levophed in the ED for septic shock.  He has been feeling well since he went home.  His wife checked his temp today and it was mildly elevated.  He was told to come back to the ED if he developed a fever.  Pt's sepsis was thought to be cholangitis.  He also had a mass on his liver which is getting biopsied by Dr. Laural Golden on 7/9.  Pt has been having issues with back pain.  He had an abdominal MRI while admitted, and there were no suspicious bony lesions noted.  He had a lumbar MRI on 5/11, but has had epidural injections since then.        Past Medical History:  Diagnosis Date   Arthritis    Collagen vascular disease ([place])    Coronary atherosclerosis of native coronary artery    Multivessel status post CABG 2000   Essential hypertension    Hyperlipidemia    Prostate cancer [facility])     Patient Active Problem List   Diagnosis Date Noted   Acute midline low back pain with bilateral sciatica    AKI (acute kidney injury) ([place])    Atrial flutter with rapid ventricular response ([place]) 01/23/2020   Goals of care, counseling/discussion    Palliative care by specialist    DNR (do not resuscitate) discussion    Hypotension 01/21/2020   Abdominal pain-Pancreatic and liver masses 01/21/2020   Severe sepsis with septic shock ([place]) 01/21/2020   Nocturia 11/20/2019   Bone metastases ([place]) 12/25/2018   Male hypogonadism 12/25/2018   Prostate cancer --with bony metastases, status post prior prostatectomy in 2004 12/25/2018   Fusion of spine of lumbosacral region 12/19/2017   Essential hypertension 01/23/2013   Coronary  atherosclerosis of native coronary artery 01/23/2013   Mixed hyperlipidemia 01/23/2013    Past Surgical History:  Procedure Laterality Date   APPENDECTOMY     8 yrs ago- Dr Geroge Baseman   CATARACT EXTRACTION [facility] Left 07/14/2014   Procedure: CATARACT EXTRACTION PHACO AND INTRAOCULAR LENS PLACEMENT ([place]);  Surgeon: Tonny Branch, MD;  Location: [facility] ORS;  Service: Ophthalmology;  Laterality: Left;  CDE:6.20   CATARACT EXTRACTION W/PHACO Right 08/11/2014   Procedure: CATARACT EXTRACTION PHACO AND INTRAOCULAR LENS PLACEMENT RIGHT EYE;  Surgeon: Tonny Branch, MD;  Location: [facility] ORS;  Service: Ophthalmology;  Laterality: Right;  CDE:10.11   COLONOSCOPY N/A 01/14/2015   Procedure: COLONOSCOPY;  Surgeon: Rogene Houston, MD;  Location: [facility] ENDO SUITE;  Service: Endoscopy;  Laterality: N/A;  730   CORONARY ARTERY BYPASS GRAFT  01/1999   LIMA to LAD, SVG to diagonal, sequential SVG to PDA and PLB   PROSTATECTOMY  2004   [place]   TONSILLECTOMY     age 40       Family History  Problem Relation Age of Onset   Heart attack Mother    Cancer Mother    Aneurysm Father     Social History   Tobacco Use   Smoking status: Never Smoker   Smokeless tobacco: Never Used  Vaping Use   Vaping Use: Never  used  Substance Use Topics   Alcohol use: Yes    Alcohol/week: 0.0 standard drinks    Comment: daily-glass of wine   Drug use: No    Home Medications Prior to Admission medications   Medication Sig Start Date End Date Taking? Authorizing Provider  apixaban (ELIQUIS) 5 MG TABS tablet Take 1 tablet (5 mg total) by mouth 2 (two) times daily. 01/27/20 03/27/20  Murlean Iba, MD  Calcium-Magnesium-Vitamin D (CALCIUM 1200+D3 PO) Take 1 tablet by mouth daily.     [provider]  fluticasone (FLONASE) 50 MCG/ACT nasal spray Place 1 spray into both nostrils daily as needed for allergies or rhinitis.    [provider]  methocarbamol (ROBAXIN) 750 MG tablet Take 750  mg by mouth 3 (three) times daily. 11/18/19   [provider]  Metoprolol Tartrate 75 MG TABS Take 75 mg by mouth 2 (two) times daily. 01/27/20 03/27/20  Murlean Iba, MD  Multiple Vitamin (MULTIVITAMIN WITH MINERALS) TABS Take 1 tablet by mouth daily.    [provider]  nitroGLYCERIN (NITROSTAT) 0.4 MG SL tablet Place 1 tablet (0.4 mg total) under the tongue every 5 (five) minutes x 3 doses as needed for chest pain (if no relief after 3rd dose, proceed to the ED for an evaluation or call 911). 12/06/18 01/31/20  Satira Sark, MD  predniSONE (DELTASONE) 5 MG tablet Take 5 mg by mouth 2 (two) times daily.  09/20/16   [provider]  rosuvastatin (CRESTOR) 40 MG tablet Take 1 tablet (40 mg total) by mouth daily after supper. 11/29/19 02/27/20  Satira Sark, MD  Thiamine HCl (VITAMIN B-1 PO) Take 1 tablet by mouth daily.    [provider]  ZYTIGA 250 MG tablet Take 4 tablets by mouth daily. 09/20/16   [provider]    Allergies    Latex and Sulfa antibiotics  Review of Systems   Review of Systems  Constitutional: Positive for fever.  Musculoskeletal: Positive for back pain.  All other systems reviewed and are negative.   Physical Exam Updated Vital Signs BP (!) 124/58    Pulse 64    Temp 99.4 F (37.4 C) (Oral)    Resp 19    Ht 5\' 6"  (1.676 m)    Wt 76.7 kg    SpO2 96%    BMI 27.28 kg/m   Physical Exam Vitals and nursing note reviewed.  Constitutional:      Appearance: Normal appearance.  HENT:     Head: Normocephalic and atraumatic.     Right Ear: External ear normal.     Left Ear: External ear normal.     Nose: Nose normal.     Mouth/Throat:     Mouth: Mucous membranes are moist.     Pharynx: Oropharynx is clear.  Eyes:     Extraocular Movements: Extraocular movements intact.     Conjunctiva/sclera: Conjunctivae normal.     Pupils: Pupils are equal, round, and reactive to light.  Cardiovascular:     Rate and Rhythm:  Normal rate and regular rhythm.     Pulses: Normal pulses.     Heart sounds: Normal heart sounds.  Pulmonary:     Effort: Pulmonary effort is normal.     Breath sounds: Normal breath sounds.  Abdominal:     General: Abdomen is flat. Bowel sounds are normal.     Palpations: Abdomen is soft.  Musculoskeletal:        General: Normal range  of motion.     Cervical back: Normal range of motion and neck supple.  Skin:    General: Skin is warm.     Capillary Refill: Capillary refill takes less than 2 seconds.  Neurological:     General: No focal deficit present.     Mental Status: He is alert and oriented to person, place, and time.  Psychiatric:        Mood and Affect: Mood normal.        Behavior: Behavior normal.        Thought Content: Thought content normal.        Judgment: Judgment normal.     ED Results / Procedures / Treatments   Labs (all labs ordered are listed, but only abnormal results are displayed) Labs Reviewed  COMPREHENSIVE METABOLIC PANEL - Abnormal; Notable for the following components:      Result Value   BUN 24 (*)    Creatinine, Ser 1.32 (*)    Total Protein 6.4 (*)    Albumin 3.1 (*)    GFR calc non Af Amer 51 (*)    GFR calc Af Amer 59 (*)    All other components within normal limits  CBC WITH DIFFERENTIAL/PLATELET - Abnormal; Notable for the following components:   RBC 3.80 (*)    Hemoglobin 11.7 (*)    HCT 37.4 (*)    All other components within normal limits  LACTIC ACID, PLASMA - Abnormal; Notable for the following components:   Lactic Acid, Venous 2.0 (*)    All other components within normal limits  URINALYSIS, ROUTINE W REFLEX MICROSCOPIC - Abnormal; Notable for the following components:   Color, Urine AMBER (*)    APPearance CLOUDY (*)    Protein, ur 30 (*)    All other components within normal limits  LIPASE, BLOOD - Abnormal; Notable for the following components:   Lipase 53 (*)    All other components within normal limits  CULTURE,  BLOOD (ROUTINE X 2)  CULTURE, BLOOD (ROUTINE X 2)  URINE CULTURE  LACTIC ACID, PLASMA    EKG EKG Interpretation  Date/Time:  Monday February 03 2020 09:42:37 EDT Ventricular Rate:  71 PR Interval:    QRS Duration: 99 QT Interval:  376 QTC Calculation: 409 R Axis:   -30 Text Interpretation: Sinus rhythm RSR' in V1 or V2, right VCD or RVH Inferior infarct, old No significant change since last tracing Confirmed by Isla Pence [phone]) on 02/03/2020 10:03:53 AM   Radiology DG Chest 2 View  Result Date: 02/03/2020 CLINICAL DATA:  Fever EXAM: CHEST - 2 VIEW COMPARISON:  January 23, 2020 FINDINGS: The heart size and mediastinal contours are stable. Both lungs are clear. The visualized skeletal structures are unremarkable. IMPRESSION: No active cardiopulmonary disease. Electronically Signed   By: Abelardo Diesel M.D.   On: 02/03/2020 10:47    Procedures Procedures (including critical care time)  Medications Ordered in ED Medications  sodium chloride 0.9 % bolus 500 mL (0 mLs Intravenous Stopped 02/03/20 1124)    ED Course  I have reviewed the triage vital signs and the nursing notes.  Pertinent labs & imaging results that were available during my care of the patient were reviewed by me and considered in my medical decision making (see chart for details).    MDM Rules/Calculators/A&P                          No fever here, but he  did take a tylenol.  Pt's initial lactic is 2, but it came down to 1.0 after IVFs.  Pt's wbc is nl.  LFTs are nl.  Blood cultures have been sent.  Pt feels ok.  Pt is stable for d/c.  Return if worse.  Final Clinical Impression(s) / ED Diagnoses Final diagnoses:  Elevated temperature    Rx / DC Orders ED Discharge Orders    None       Isla Pence, MD 02/03/20 1249

## 2020-02-03 NOTE — ED Triage Notes (Signed)
Pt reports that he was discharged from hospital a week ago due to sepsis. Running a fever of 99.4 this morning.

## 2020-02-03 NOTE — ED Notes (Signed)
Date and time results received: 02/03/20 1041 (use smartphrase ".now" to insert current time)  Test: lactic acid Critical Value: 2.0  Name of Provider Notified: DR. Gilford Raid  Orders Received? Or Actions Taken?: n/a

## 2020-02-04 ENCOUNTER — Emergency Department (HOSPITAL_COMMUNITY): Payer: Medicare Other

## 2020-02-04 ENCOUNTER — Inpatient Hospital Stay (HOSPITAL_COMMUNITY)
Admission: EM | Admit: 2020-02-04 | Discharge: 2020-02-06 | DRG: 372 | Disposition: A | Discharge status: Home / Self Care | Payer: Medicare Other | Attending: Family Medicine | Admitting: Family Medicine

## 2020-02-04 ENCOUNTER — Encounter (HOSPITAL_COMMUNITY): Payer: Self-pay | Admitting: Emergency Medicine

## 2020-02-04 ENCOUNTER — Other Ambulatory Visit: Payer: Self-pay

## 2020-02-04 DIAGNOSIS — Z8546 Personal history of malignant neoplasm of prostate: Secondary | ICD-10-CM | POA: Diagnosis not present

## 2020-02-04 DIAGNOSIS — D6489 Other specified anemias: Secondary | ICD-10-CM | POA: Diagnosis present

## 2020-02-04 DIAGNOSIS — I2510 Atherosclerotic heart disease of native coronary artery without angina pectoris: Secondary | ICD-10-CM | POA: Diagnosis present

## 2020-02-04 DIAGNOSIS — Z7952 Long term (current) use of systemic steroids: Secondary | ICD-10-CM

## 2020-02-04 DIAGNOSIS — I10 Essential (primary) hypertension: Secondary | ICD-10-CM | POA: Diagnosis present

## 2020-02-04 DIAGNOSIS — I959 Hypotension, unspecified: Secondary | ICD-10-CM | POA: Diagnosis present

## 2020-02-04 DIAGNOSIS — Z9079 Acquired absence of other genital organ(s): Secondary | ICD-10-CM | POA: Diagnosis not present

## 2020-02-04 DIAGNOSIS — I129 Hypertensive chronic kidney disease with stage 1 through stage 4 chronic kidney disease, or unspecified chronic kidney disease: Secondary | ICD-10-CM | POA: Diagnosis present

## 2020-02-04 DIAGNOSIS — K8020 Calculus of gallbladder without cholecystitis without obstruction: Secondary | ICD-10-CM | POA: Diagnosis present

## 2020-02-04 DIAGNOSIS — K5792 Diverticulitis of intestine, part unspecified, without perforation or abscess without bleeding: Secondary | ICD-10-CM | POA: Diagnosis not present

## 2020-02-04 DIAGNOSIS — C61 Malignant neoplasm of prostate: Secondary | ICD-10-CM | POA: Diagnosis present

## 2020-02-04 DIAGNOSIS — E876 Hypokalemia: Secondary | ICD-10-CM | POA: Diagnosis present

## 2020-02-04 DIAGNOSIS — Z79899 Other long term (current) drug therapy: Secondary | ICD-10-CM | POA: Diagnosis not present

## 2020-02-04 DIAGNOSIS — Z20822 Contact with and (suspected) exposure to covid-19: Secondary | ICD-10-CM | POA: Diagnosis present

## 2020-02-04 DIAGNOSIS — Z951 Presence of aortocoronary bypass graft: Secondary | ICD-10-CM | POA: Diagnosis not present

## 2020-02-04 DIAGNOSIS — A0472 Enterocolitis due to Clostridium difficile, not specified as recurrent: Secondary | ICD-10-CM | POA: Diagnosis present

## 2020-02-04 DIAGNOSIS — K769 Liver disease, unspecified: Secondary | ICD-10-CM | POA: Diagnosis present

## 2020-02-04 DIAGNOSIS — C7951 Secondary malignant neoplasm of bone: Secondary | ICD-10-CM | POA: Diagnosis present

## 2020-02-04 DIAGNOSIS — N1831 Chronic kidney disease, stage 3a: Secondary | ICD-10-CM | POA: Diagnosis present

## 2020-02-04 LAB — COMPREHENSIVE METABOLIC PANEL
ALT: 33 U/L (ref 0–44)
AST: 26 U/L (ref 15–41)
Albumin: 3 g/dL — ABNORMAL LOW (ref 3.5–5.0)
Alkaline Phosphatase: 78 U/L (ref 38–126)
Anion gap: 10 (ref 5–15)
BUN: 21 mg/dL (ref 8–23)
CO2: 24 mmol/L (ref 22–32)
Calcium: 9 mg/dL (ref 8.9–10.3)
Chloride: 97 mmol/L — ABNORMAL LOW (ref 98–111)
Creatinine, Ser: 1.43 mg/dL — ABNORMAL HIGH (ref 0.61–1.24)
GFR calc Af Amer: 54 mL/min — ABNORMAL LOW (ref >60)
GFR calc non Af Amer: 47 mL/min — ABNORMAL LOW (ref >60)
Glucose, Bld: 98 mg/dL (ref 70–99)
Potassium: 3.6 mmol/L (ref 3.5–5.1)
Sodium: 131 mmol/L — ABNORMAL LOW (ref 135–145)
Total Bilirubin: 1.1 mg/dL (ref 0.3–1.2)
Total Protein: 6.3 g/dL — ABNORMAL LOW (ref 6.5–8.1)

## 2020-02-04 LAB — URINALYSIS, ROUTINE W REFLEX MICROSCOPIC
Bilirubin Urine: NEGATIVE
Glucose, UA: NEGATIVE mg/dL
Hgb urine dipstick: NEGATIVE
Ketones, ur: NEGATIVE mg/dL
Leukocytes,Ua: NEGATIVE
Nitrite: NEGATIVE
Protein, ur: 30 mg/dL — AB
Specific Gravity, Urine: 1.017 (ref 1.005–1.030)
pH: 5 (ref 5.0–8.0)

## 2020-02-04 LAB — CBC WITH DIFFERENTIAL/PLATELET
Abs Immature Granulocytes: 0.04 10*3/uL (ref 0.00–0.07)
Basophils Absolute: 0.1 10*3/uL (ref 0.0–0.1)
Basophils Relative: 1 %
Eosinophils Absolute: 0 10*3/uL (ref 0.0–0.5)
Eosinophils Relative: 1 %
HCT: 36 % — ABNORMAL LOW (ref 39.0–52.0)
Hemoglobin: 11.5 g/dL — ABNORMAL LOW (ref 13.0–17.0)
Immature Granulocytes: 1 %
Lymphocytes Relative: 21 %
Lymphs Abs: 1.8 10*3/uL (ref 0.7–4.0)
MCH: 31.3 pg (ref 26.0–34.0)
MCHC: 31.9 g/dL (ref 30.0–36.0)
MCV: 97.8 fL (ref 80.0–100.0)
Monocytes Absolute: 0.8 10*3/uL (ref 0.1–1.0)
Monocytes Relative: 10 %
Neutro Abs: 6 10*3/uL (ref 1.7–7.7)
Neutrophils Relative %: 66 %
Platelets: 357 10*3/uL (ref 150–400)
RBC: 3.68 MIL/uL — ABNORMAL LOW (ref 4.22–5.81)
RDW: 14.2 % (ref 11.5–15.5)
WBC: 8.8 10*3/uL (ref 4.0–10.5)
nRBC: 0 % (ref 0.0–0.2)

## 2020-02-04 LAB — URINE CULTURE: Culture: NO GROWTH

## 2020-02-04 LAB — LACTIC ACID, PLASMA: Lactic Acid, Venous: 1.6 mmol/L (ref 0.5–1.9)

## 2020-02-04 LAB — SARS CORONAVIRUS 2 BY RT PCR (HOSPITAL ORDER, PERFORMED IN ~~LOC~~ HOSPITAL LAB): SARS Coronavirus 2: NEGATIVE

## 2020-02-04 LAB — PROTIME-INR
INR: 1.2 (ref 0.8–1.2)
Prothrombin Time: 15.1 seconds (ref 11.4–15.2)

## 2020-02-04 MED ORDER — CIPROFLOXACIN IN D5W 400 MG/200ML IV SOLN
400.0000 mg | Freq: Once | INTRAVENOUS | Status: AC
  Administered 2020-02-04: 400 mg via INTRAVENOUS
  Filled 2020-02-04: qty 200

## 2020-02-04 MED ORDER — VANCOMYCIN 50 MG/ML ORAL SOLUTION
125.0000 mg | Freq: Four times a day (QID) | ORAL | Status: DC
  Administered 2020-02-04 – 2020-02-06 (×7): 125 mg via ORAL
  Filled 2020-02-04 (×12): qty 2.5

## 2020-02-04 MED ORDER — SODIUM CHLORIDE 0.9 % IV BOLUS
1000.0000 mL | Freq: Once | INTRAVENOUS | Status: AC
  Administered 2020-02-04: 1000 mL via INTRAVENOUS

## 2020-02-04 MED ORDER — VANCOMYCIN HCL IN DEXTROSE 1-5 GM/200ML-% IV SOLN: 1000.0000 mg | INTRAVENOUS | Status: DC

## 2020-02-04 MED ORDER — ONDANSETRON HCL 4 MG/2ML IJ SOLN: 4.0000 mg | Freq: Four times a day (QID) | INTRAMUSCULAR | Status: DC | PRN

## 2020-02-04 MED ORDER — ROSUVASTATIN CALCIUM 20 MG PO TABS
40.0000 mg | ORAL_TABLET | Freq: Every day | ORAL | Status: DC
  Administered 2020-02-04: 40 mg via ORAL
  Filled 2020-02-04: qty 2

## 2020-02-04 MED ORDER — IOHEXOL 300 MG/ML  SOLN
100.0000 mL | Freq: Once | INTRAMUSCULAR | Status: AC | PRN
  Administered 2020-02-04: 100 mL via INTRAVENOUS

## 2020-02-04 MED ORDER — PREDNISONE 10 MG PO TABS
5.0000 mg | ORAL_TABLET | Freq: Two times a day (BID) | ORAL | Status: DC
  Administered 2020-02-04 – 2020-02-06 (×4): 5 mg via ORAL
  Filled 2020-02-04 (×4): qty 1

## 2020-02-04 MED ORDER — POTASSIUM CHLORIDE IN NACL 20-0.9 MEQ/L-% IV SOLN: INTRAVENOUS | Status: AC

## 2020-02-04 MED ORDER — ONDANSETRON HCL 4 MG PO TABS: 4.0000 mg | ORAL_TABLET | Freq: Four times a day (QID) | ORAL | Status: DC | PRN

## 2020-02-04 MED ORDER — POLYETHYLENE GLYCOL 3350 17 G PO PACK: 17.0000 g | PACK | Freq: Every day | ORAL | Status: DC | PRN

## 2020-02-04 MED ORDER — ACETAMINOPHEN 325 MG PO TABS
650.0000 mg | ORAL_TABLET | Freq: Once | ORAL | Status: AC
  Administered 2020-02-04: 650 mg via ORAL
  Filled 2020-02-04: qty 2

## 2020-02-04 MED ORDER — BOOST / RESOURCE BREEZE PO LIQD CUSTOM
1.0000 | Freq: Three times a day (TID) | ORAL | Status: DC
  Administered 2020-02-04: 1 via ORAL

## 2020-02-04 MED ORDER — VANCOMYCIN HCL 1500 MG/300ML IV SOLN
1500.0000 mg | Freq: Once | INTRAVENOUS | Status: AC
  Administered 2020-02-04: 1500 mg via INTRAVENOUS
  Filled 2020-02-04: qty 300

## 2020-02-04 MED ORDER — METRONIDAZOLE IN NACL 5-0.79 MG/ML-% IV SOLN
500.0000 mg | Freq: Three times a day (TID) | INTRAVENOUS | Status: DC
  Administered 2020-02-05 (×2): 500 mg via INTRAVENOUS
  Filled 2020-02-04 (×2): qty 100

## 2020-02-04 MED ORDER — ACETAMINOPHEN 650 MG RE SUPP: 650.0000 mg | Freq: Four times a day (QID) | RECTAL | Status: DC | PRN

## 2020-02-04 MED ORDER — SODIUM CHLORIDE 0.9 % IV SOLN
2.0000 g | INTRAVENOUS | Status: DC
  Administered 2020-02-04: 2 g via INTRAVENOUS
  Filled 2020-02-04: qty 20

## 2020-02-04 MED ORDER — ABIRATERONE ACETATE 250 MG PO TABS: 1000.0000 mg | ORAL_TABLET | Freq: Every morning | ORAL | Status: DC

## 2020-02-04 MED ORDER — APIXABAN 5 MG PO TABS
5.0000 mg | ORAL_TABLET | Freq: Two times a day (BID) | ORAL | Status: DC
  Administered 2020-02-04 – 2020-02-06 (×4): 5 mg via ORAL
  Filled 2020-02-04 (×4): qty 1

## 2020-02-04 MED ORDER — METRONIDAZOLE IN NACL 5-0.79 MG/ML-% IV SOLN
500.0000 mg | Freq: Once | INTRAVENOUS | Status: AC
  Administered 2020-02-04: 500 mg via INTRAVENOUS
  Filled 2020-02-04: qty 100

## 2020-02-04 MED ORDER — METOPROLOL TARTRATE 50 MG PO TABS
75.0000 mg | ORAL_TABLET | Freq: Two times a day (BID) | ORAL | Status: DC
  Administered 2020-02-04 – 2020-02-06 (×4): 75 mg via ORAL
  Filled 2020-02-04 (×4): qty 1

## 2020-02-04 MED ORDER — ACETAMINOPHEN 325 MG PO TABS
650.0000 mg | ORAL_TABLET | Freq: Four times a day (QID) | ORAL | Status: DC | PRN
  Administered 2020-02-05: 650 mg via ORAL
  Filled 2020-02-04: qty 2

## 2020-02-04 NOTE — ED Provider Notes (Signed)
[facility] Provider Note   CSN: [id number] Arrival date & time: 02/04/20  1230     History Chief Complaint  Patient presents with  . Abnormal Lab    Samuel Gregory is a 79 y.o. male.  HPI Patient is a 79 year old male with medical history as noted below.  He is anticoagulated on Eliquis.  He was most recently seen in the emergency department yesterday.  Prior to this he was admitted from June 22 through June 28 for sepsis secondary to strep.  He required Levophed in the emergency department for septic shock.  After being discharged patient was feeling well since he had been home.  He lives with his wife.  He went to the emergency department yesterday after his wife noticed that his temperature was elevated.  Patient took Tylenol and then went to the emergency department.  He had no significant fever in the ED.  His initial lactic yesterday was 2 but after a liter of IV fluid normalized.  No leukocytosis yesterday.  LFTs were normal yesterday.  He has a liver biopsy scheduled in 3 days.  Patient was ultimately discharged.  Patient presents today, once again febrile.  His initial temperature is 101.2 F.  He states he was instructed by Dr. Laural Golden with GI to come to the emergency department for a CT scan of the abdomen.  Patient has no abdominal pain at this time.  No nausea, vomiting, diarrhea.  He does report some mild dysuria for the past week.  No known hematuria.  Additionally complains of some chronic low back pain but denies this has acutely worsened.  No acute chest pain or shortness of breath.  No other complaints at this time.     Past Medical History:  Diagnosis Date  . Arthritis   . Collagen vascular disease ([place])   . Coronary atherosclerosis of native coronary artery    Multivessel status post CABG 2000  . Essential hypertension   . Hyperlipidemia   . Prostate cancer [facility])     Patient Active Problem List   Diagnosis Date Noted  . Acute midline low  back pain with bilateral sciatica   . AKI (acute kidney injury) ([place])   . Atrial flutter with rapid ventricular response ([place]) 01/23/2020  . Goals of care, counseling/discussion   . Palliative care by specialist   . DNR (do not resuscitate) discussion   . Hypotension 01/21/2020  . Abdominal pain-Pancreatic and liver masses 01/21/2020  . Severe sepsis with septic shock ([place]) 01/21/2020  . Nocturia 11/20/2019  . Bone metastases ([place]) 12/25/2018  . Male hypogonadism 12/25/2018  . Prostate cancer --with bony metastases, status post prior prostatectomy in 2004 12/25/2018  . Fusion of spine of lumbosacral region 12/19/2017  . Essential hypertension 01/23/2013  . Coronary atherosclerosis of native coronary artery 01/23/2013  . Mixed hyperlipidemia 01/23/2013    Past Surgical History:  Procedure Laterality Date  . APPENDECTOMY     8 yrs ago- Dr Geroge Baseman  . CATARACT EXTRACTION W/PHACO Left 07/14/2014   Procedure: CATARACT EXTRACTION PHACO AND INTRAOCULAR LENS PLACEMENT ([facility]);  Surgeon: Tonny Branch, MD;  Location: [facility] ORS;  Service: Ophthalmology;  Laterality: Left;  CDE:6.20  . CATARACT EXTRACTION W/PHACO Right 08/11/2014   Procedure: CATARACT EXTRACTION PHACO AND INTRAOCULAR LENS PLACEMENT RIGHT EYE;  Surgeon: Tonny Branch, MD;  Location: [facility] ORS;  Service: Ophthalmology;  Laterality: Right;  CDE:10.11  . COLONOSCOPY N/A 01/14/2015   Procedure: COLONOSCOPY;  Surgeon: Rogene Houston, MD;  Location: [facility]  ENDO SUITE;  Service: Endoscopy;  Laterality: N/A;  730  . CORONARY ARTERY BYPASS GRAFT  01/1999   LIMA to LAD, SVG to diagonal, sequential SVG to PDA and PLB  . PROSTATECTOMY  2004   [name]  . TONSILLECTOMY     age 19       Family History  Problem Relation Age of Onset  . Heart attack Mother   . Cancer Mother   . Aneurysm Father     Social History   Tobacco Use  . Smoking status: Never Smoker  . Smokeless tobacco: Never Used  Vaping Use  . Vaping Use: Never used  Substance  Use Topics  . Alcohol use: Yes    Alcohol/week: 0.0 standard drinks    Comment: daily-glass of wine  . Drug use: No   Home Medications Prior to Admission medications   Medication Sig Start Date End Date Taking? Authorizing Provider  apixaban (ELIQUIS) 5 MG TABS tablet Take 1 tablet (5 mg total) by mouth 2 (two) times daily. 01/27/20 03/27/20  Murlean Iba, MD  Calcium-Magnesium-Vitamin D (CALCIUM 1200+D3 PO) Take 1 tablet by mouth daily.     [provider]  fluticasone (FLONASE) 50 MCG/ACT nasal spray Place 1 spray into both nostrils daily as needed for allergies or rhinitis.    [provider]  methocarbamol (ROBAXIN) 750 MG tablet Take 750 mg by mouth 3 (three) times daily. 11/18/19   [provider]  Metoprolol Tartrate 75 MG TABS Take 75 mg by mouth 2 (two) times daily. 01/27/20 03/27/20  Murlean Iba, MD  Multiple Vitamin (MULTIVITAMIN WITH MINERALS) TABS Take 1 tablet by mouth daily.    [provider]  nitroGLYCERIN (NITROSTAT) 0.4 MG SL tablet Place 1 tablet (0.4 mg total) under the tongue every 5 (five) minutes x 3 doses as needed for chest pain (if no relief after 3rd dose, proceed to the ED for an evaluation or call 911). 12/06/18 01/31/20  Satira Sark, MD  predniSONE (DELTASONE) 5 MG tablet Take 5 mg by mouth 2 (two) times daily.  09/20/16   [provider]  rosuvastatin (CRESTOR) 40 MG tablet Take 1 tablet (40 mg total) by mouth daily after supper. 11/29/19 02/27/20  Satira Sark, MD  Thiamine HCl (VITAMIN B-1 PO) Take 1 tablet by mouth daily.    [provider]  ZYTIGA 250 MG tablet Take 4 tablets by mouth daily. 09/20/16   [provider]    Allergies    Latex and Sulfa antibiotics  Review of Systems   Review of Systems  All other systems reviewed and are negative. Ten systems reviewed and are negative for acute change, except as noted in the HPI.   Physical Exam Updated Vital Signs BP 106/67  (BP Location: Right Arm)   Pulse 82   Temp (!) 101.2 F (38.4 C) (Oral)   Resp 17   Ht 5\' 6"  (1.676 m)   Wt 76 kg   SpO2 95%   BMI 27.04 kg/m   Physical Exam Vitals and nursing note reviewed.  Constitutional:      General: He is not in acute distress.    Appearance: Normal appearance. He is ill-appearing. He is not toxic-appearing or diaphoretic.  HENT:     Head: Normocephalic and atraumatic.     Right Ear: External ear normal.     Left Ear: External ear normal.     Nose: Nose normal.     Mouth/Throat:  Mouth: Mucous membranes are dry.     Pharynx: Oropharynx is clear. No oropharyngeal exudate or posterior oropharyngeal erythema.  Eyes:     General: No scleral icterus.       Right eye: No discharge.        Left eye: No discharge.     Extraocular Movements: Extraocular movements intact.     Conjunctiva/sclera: Conjunctivae normal.     Pupils: Pupils are equal, round, and reactive to light.  Cardiovascular:     Rate and Rhythm: Normal rate and regular rhythm.     Pulses: Normal pulses.     Heart sounds: Normal heart sounds. No murmur heard.  No friction rub. No gallop.   Pulmonary:     Effort: Pulmonary effort is normal. No respiratory distress.     Breath sounds: Normal breath sounds. No stridor. No wheezing, rhonchi or rales.  Chest:     Chest wall: No tenderness.  Abdominal:     General: Abdomen is flat.     Palpations: Abdomen is soft.     Tenderness: There is no abdominal tenderness.     Comments: Abdomen is soft and nontender in all 4 quadrants.  Ecchymosis noted in the left lower and right lower quadrant.  Patient states this is been present since his prior admission.  Musculoskeletal:        General: Normal range of motion.     Cervical back: Normal range of motion and neck supple. No tenderness.     Right lower leg: No edema.     Left lower leg: No edema.     Comments: No pedal edema appreciated.  Palpable pedal pulses.  Skin:    General: Skin is warm  and dry.     Comments: Friable skin noted on all 4 extremities.  Neurological:     General: No focal deficit present.     Mental Status: He is alert and oriented to person, place, and time.  Psychiatric:        Mood and Affect: Mood normal.        Behavior: Behavior normal.    ED Results / Procedures / Treatments   Labs (all labs ordered are listed, but only abnormal results are displayed) Labs Reviewed  COMPREHENSIVE METABOLIC PANEL - Abnormal; Notable for the following components:      Result Value   Sodium 131 (*)    Chloride 97 (*)    Creatinine, Ser 1.43 (*)    Total Protein 6.3 (*)    Albumin 3.0 (*)    GFR calc non Af Amer 47 (*)    GFR calc Af Amer 54 (*)    All other components within normal limits  CBC WITH DIFFERENTIAL/PLATELET - Abnormal; Notable for the following components:   RBC 3.68 (*)    Hemoglobin 11.5 (*)    HCT 36.0 (*)    All other components within normal limits  URINALYSIS, ROUTINE W REFLEX MICROSCOPIC - Abnormal; Notable for the following components:   APPearance CLOUDY (*)    Protein, ur 30 (*)    Bacteria, UA RARE (*)    All other components within normal limits  CULTURE, BLOOD (ROUTINE X 2)  CULTURE, BLOOD (ROUTINE X 2)  SARS CORONAVIRUS 2 BY RT PCR (HOSPITAL ORDER, [facility])  C DIFFICILE QUICK SCREEN W PCR REFLEX  LACTIC ACID, PLASMA  PROTIME-INR   EKG None  Radiology DG Chest 2 View  Result Date: 02/04/2020 CLINICAL DATA:  Suspected sepsis EXAM:  CHEST - 2 VIEW COMPARISON:  02/03/2020 FINDINGS: No new consolidation or edema. No pleural effusion or pneumothorax. Stable cardiomediastinal contours with normal heart size. No acute osseous abnormality. IMPRESSION: No acute process in the chest. Electronically Signed   By: Macy Mis M.D.   On: 02/04/2020 13:53   DG Chest 2 View  Result Date: 02/03/2020 CLINICAL DATA:  Fever EXAM: CHEST - 2 VIEW COMPARISON:  January 23, 2020 FINDINGS: The heart size and  mediastinal contours are stable. Both lungs are clear. The visualized skeletal structures are unremarkable. IMPRESSION: No active cardiopulmonary disease. Electronically Signed   By: Abelardo Diesel M.D.   On: 02/03/2020 10:47   Procedures Procedures   Medications Ordered in ED Medications  ciprofloxacin (CIPRO) IVPB 400 mg (400 mg Intravenous New Bag/Given 02/04/20 1659)    And  metroNIDAZOLE (FLAGYL) IVPB 500 mg (500 mg Intravenous New Bag/Given 02/04/20 1702)  vancomycin (VANCOCIN) 50 mg/mL oral solution 125 mg (has no administration in time range)  sodium chloride 0.9 % bolus 1,000 mL (0 mLs Intravenous Stopped 02/04/20 1655)  acetaminophen (TYLENOL) tablet 650 mg (650 mg Oral Given 02/04/20 1500)  iohexol (OMNIPAQUE) 300 MG/ML solution 100 mL (100 mLs Intravenous Contrast Given 02/04/20 1518)   ED Course  I have reviewed the triage vital signs and the nursing notes.  Pertinent labs & imaging results that were available during my care of the patient were reviewed by me and considered in my medical decision making (see chart for details).  Clinical Course as of Feb 03 1702  Tue Feb 04, 2020  1339 Moderately hypotensive compared to prior blood pressures yesterday.  BP: 106/67 [LJ]  1339 Febrile.  Temp(!): 101.2 F (38.4 C) [LJ]  1359 I spoke to Dr. Laural Golden with gastroenterology.  Request that we obtain a CT scan of the abdomen and pelvis due to patient's recurrent fever and known liver mass.  Concern whether this is abscess versus cholangiocarcinoma.   [LJ]  1429 1.32 yesterday  Creatinine(!): 1.43 [LJ]  1429 Albumin(!): 3.0 [LJ]  1429 Similar to prior baseline values  Hemoglobin(!): 11.5 [LJ]  1430 Lactic Acid, Venous: 1.6 [LJ]  1430 No acute process found in the chest  DG Chest 2 View [LJ]  1542 Repeated rectally. 650 of apap given after.   Temp(!): 100.6 F (38.1 C) [LJ]  1542 CT called me notifying me that they did not see the note asking for "liver protocol" on the CT scan. Pt had a  standard CT scan of the abd/pelvis performed with contrast. Due to his GFR/creatinine, will not have scan repeated at this time.    [LJ]  2035 Spoke to Dr. Laural Golden who has already evaluated the patient's CT scan.  He feels that the mass in his liver has actually decreased in size.  He is seeing diverticulitis on the CT scan.  Recommended that we start the patient on Cipro/Flagyl.  I will do so.  Patient will be admitted.   [LJ]  46 While Dr. Laural Golden was meeting with patient he did begin to endorse loose stools. Concern for possible c-diff as well as diverticulitis. Stool studies ordered. PO vancomycin ordered.    [LJ]    Clinical Course User Index [LJ] Rayna Sexton, PA-C   MDM Rules/Calculators/A&P                          Patient is a 79 year old male with a history, physical exam, ED clinical course as noted above.  CT  scan of the abdomen and pelvis shows:  1. Occasional sigmoid diverticula, with wall thickening and fat stranding about a prominent diverticulum of the mid sigmoid, consistent with acute diverticulitis. No evidence of perforation or abscess. 2. Redemonstrated peripherally enhancing hypodense mass of the inferior tip of the right lobe of the liver, hepatic segment VI, measuring approximately 2.5 cm on this examination, better characterized by prior multiphasic contrast enhanced MRI. 3. Redemonstrated low-attenuation lesion of the pancreatic head adjacent to the common bile duct measuring 1.5 cm, again better characterized by prior multiphasic MRI. 4. Status post prostatectomy. 5. Coronary artery disease. Aortic Atherosclerosis (ICD10-I70.0).  Patient has been discussed with and evaluated by Dr. Laural Golden with gastroenterology.  Will obtain a COVID-19 test at this time.  Will admit the patient for IV antibiotics and further management.  Note: Portions of this report may have been transcribed using voice recognition software. Every effort was made to ensure accuracy;  however, inadvertent computerized transcription errors may be present.    Final Clinical Impression(s) / ED Diagnoses Final diagnoses:  Diverticulitis   Rx / DC Orders ED Discharge Orders    None       Rayna Sexton, PA-C 02/04/20 1704    Veryl Speak, MD 02/05/20 [phone]

## 2020-02-04 NOTE — ED Notes (Signed)
PT is aware we need urine sample, urinal at bedside.  

## 2020-02-04 NOTE — Progress Notes (Signed)
Abiraterone (Zytiga) Hold Criteria:  ALT or AST > 5x ULN  TBili > 3x ULN  Active infection  Note that patient should be continued on prednisone if was receiving prednisone as outpatient.  Abiraterone held her inpatient P&T policy while being treated for active infection.  Consider also hold prednisone if clinically appropriate.    Netta Cedars, PharmD, BCPS 02/04/2020@10 :83 PM

## 2020-02-04 NOTE — ED Triage Notes (Signed)
Pt states Dr. Shonna Chock sent him here for an Abd CT scan. Patient has no complaints of pain.

## 2020-02-04 NOTE — H&P (Addendum)
History and Physical    Samuel Gregory DOB: [birth year] DOA: 02/04/2020  PCP: Celene Squibb, MD   Patient coming from: Home  I have personally briefly reviewed patient's old medical records in [place]  Chief Complaint: fevers  HPI: Samuel Gregory is a 79 y.o. male with medical history significant for prostate cancer with bone metastasis, coronary artery disease, hypertension, atrial flutter. Patient presented to the ED with complaints of fever and chills since yesterday.  He also reports some pain with urination.  He reports 4 episodes of loose stools this morning.  No vomiting.  No abdominal pain.  No difficulty breathing, reports a mild occasional cough.  Mild headache earlier today has resolved, no neck pain or stiffness.  He has no pain or symptoms related to history throat at this time.  Recent hospitalization 6/22 through 6/28-for severe sepsis secondary to strep bacteremia.  Initially treated with broad-spectrum antibiotics then narrowed to IV ceftriaxone, sent home on cefdinir.  Patient completed a 10-day course of antibiotics.suspect source was cholangitis.  Elevated liver enzymes down trended.  CT renal protocol and MRI abdomen suggested possible peripheral cholangiocarcinoma.  Outpatient liver biopsy was planned.  Patient called GI office today, and it was recommended that patient come back to the ER today for evaluation, and a CT of his abdomen.  ED Course: Temperature 101.2, blood pressure 106- 139.  Lactic acid 1.6.  WBC 8.8.  Abdominal CT with contrast showed findings consistent with acute diverticulitis, MRI recommended for further evaluation of liver and pancreatic head lesion.  Dr. Laural Golden was consulted in the ED, recommended IV Cipro and metronidazole- which was started, rule out C. Difficile, oral vancomycin, liver biopsy will be postponed.  Review of Systems: As per HPI all other systems reviewed and negative.  Past Medical History:  Diagnosis Date  .  Arthritis   . Collagen vascular disease ([place])   . Coronary atherosclerosis of native coronary artery    Multivessel status post CABG 2000  . Essential hypertension   . Hyperlipidemia   . Prostate cancer [facility])     Past Surgical History:  Procedure Laterality Date  . APPENDECTOMY     8 yrs ago- Dr Geroge Baseman  . CATARACT EXTRACTION W/PHACO Left 07/14/2014   Procedure: CATARACT EXTRACTION PHACO AND INTRAOCULAR LENS PLACEMENT (IOC);  Surgeon: Tonny Branch, MD;  Location: [facility] ORS;  Service: Ophthalmology;  Laterality: Left;  CDE:6.20  . CATARACT EXTRACTION W/PHACO Right 08/11/2014   Procedure: CATARACT EXTRACTION PHACO AND INTRAOCULAR LENS PLACEMENT RIGHT EYE;  Surgeon: Tonny Branch, MD;  Location: [facility] ORS;  Service: Ophthalmology;  Laterality: Right;  CDE:10.11  . COLONOSCOPY N/A 01/14/2015   Procedure: COLONOSCOPY;  Surgeon: Rogene Houston, MD;  Location: [facility] ENDO SUITE;  Service: Endoscopy;  Laterality: N/A;  730  . CORONARY ARTERY BYPASS GRAFT  01/1999   LIMA to LAD, SVG to diagonal, sequential SVG to PDA and PLB  . PROSTATECTOMY  2004   [name]  . TONSILLECTOMY     age 38     reports that he has never smoked. He has never used smokeless tobacco. He reports current alcohol use. He reports that he does not use drugs.  Allergies  Allergen Reactions  . Latex Rash  . Sulfa Antibiotics Rash    Family History  Problem Relation Age of Onset  . Heart attack Mother   . Cancer Mother   . Aneurysm Father      Prior to Admission medications   Medication  Sig Start Date End Date Taking? Authorizing Provider  apixaban (ELIQUIS) 5 MG TABS tablet Take 1 tablet (5 mg total) by mouth 2 (two) times daily. 01/27/20 03/27/20 Yes Johnson, Clanford L, MD  Calcium-Magnesium-Vitamin D (CALCIUM 1200+D3 PO) Take 1 tablet by mouth daily.    Yes [provider]  fluticasone (FLONASE) 50 MCG/ACT nasal spray Place 1 spray into both nostrils daily as needed for allergies or rhinitis.   Yes [provider]  metoprolol tartrate (LOPRESSOR) 25 MG tablet Take 75 mg by mouth 2 (two) times daily.   Yes [provider]  Multiple Vitamin (MULTIVITAMIN WITH MINERALS) TABS Take 1 tablet by mouth daily.   Yes [provider]  nitroGLYCERIN (NITROSTAT) 0.4 MG SL tablet Place 1 tablet (0.4 mg total) under the tongue every 5 (five) minutes x 3 doses as needed for chest pain (if no relief after 3rd dose, proceed to the ED for an evaluation or call 911). 12/06/18 02/04/20 Yes Satira Sark, MD  pantoprazole (PROTONIX) 40 MG tablet Take 40 mg by mouth daily as needed (for acid reflux).  12/27/19  Yes [provider]  predniSONE (DELTASONE) 5 MG tablet Take 5 mg by mouth 2 (two) times daily.  09/20/16  Yes [provider]  rosuvastatin (CRESTOR) 40 MG tablet Take 1 tablet (40 mg total) by mouth daily after supper. 11/29/19 02/27/20 Yes Satira Sark, MD  Thiamine HCl (VITAMIN B-1 PO) Take 1 tablet by mouth every morning.    Yes [provider]  traMADol (ULTRAM) 50 MG tablet Take 50 mg by mouth every 6 (six) hours as needed for moderate pain or severe pain.  12/16/19  Yes [provider]  ZYTIGA 250 MG tablet Take 4 tablets by mouth every morning.  09/20/16  Yes [provider]  cefdinir (OMNICEF) 300 MG capsule Take 300 mg by mouth 2 (two) times daily. 3 day course starting on 01/27/2020--COMPLETED on 01/29/2020 Patient not taking: Reported on 02/04/2020    [provider]  Metoprolol Tartrate 75 MG TABS Take 75 mg by mouth 2 (two) times daily. Patient not taking: Reported on 02/04/2020 01/27/20 03/27/20  Murlean Iba, MD    Physical Exam: Vitals:   02/04/20 1553 02/04/20 1711 02/04/20 2000 02/04/20 2048  BP: 114/63  117/70 139/83  Pulse:    77  Resp:    20  Temp:  98.1 F (36.7 C)  98.3 F (36.8 C)  TempSrc:    Oral  SpO2:    99%  Weight:    70.7 kg  Height:    5\' 6"  (1.676 m)    Constitutional: NAD, calm,  comfortable Vitals:   02/04/20 1553 02/04/20 1711 02/04/20 2000 02/04/20 2048  BP: 114/63  117/70 139/83  Pulse:    77  Resp:    20  Temp:  98.1 F (36.7 C)  98.3 F (36.8 C)  TempSrc:    Oral  SpO2:    99%  Weight:    70.7 kg  Height:    5\' 6"  (1.676 m)   Eyes: PERRL, lids and conjunctivae normal ENMT: Mucous membranes are dry  Neck: normal, supple, no masses, no thyromegaly Respiratory: clear to auscultation bilaterally, no wheezing, no crackles. Normal respiratory effort. No accessory muscle use.  Cardiovascular: Regular rate and rhythm, no murmurs / rubs / gallops. No extremity edema. 2+ pedal pulses.   Abdomen: moderate lower abdominal tenderness, no masses palpated. No hepatosplenomegaly. Bowel sounds positive.  Musculoskeletal: no clubbing /  cyanosis. No joint deformity upper and lower extremities. Good ROM, no contractures. Skin: no rashes, lesions, ulcers. No induration Neurologic: No apparent cranial normality, moving extremities spontaneously. Psychiatric: Normal judgment and insight. Alert and oriented x 3. Normal mood.   Labs on Admission: I have personally reviewed following labs and imaging studies  CBC: Recent Labs  Lab 02/03/20 1003 02/04/20 1331  WBC 9.7 8.8  NEUTROABS 7.0 6.0  HGB 11.7* 11.5*  HCT 37.4* 36.0*  MCV 98.4 97.8  PLT 375 650   Basic Metabolic Panel: Recent Labs  Lab 02/03/20 1003 02/04/20 1331  NA 137 131*  K 3.8 3.6  CL 100 97*  CO2 26 24  GLUCOSE 97 98  BUN 24* 21  CREATININE 1.32* 1.43*  CALCIUM 9.6 9.0   Liver Function Tests: Recent Labs  Lab 02/03/20 1003 02/04/20 1331  AST 27 26  ALT 38 33  ALKPHOS 77 78  BILITOT 0.8 1.1  PROT 6.4* 6.3*  ALBUMIN 3.1* 3.0*   Recent Labs  Lab 02/03/20 0958  LIPASE 53*   Coagulation Profile: Recent Labs  Lab 02/04/20 1331  INR 1.2   Urine analysis:    Component Value Date/Time   COLORURINE YELLOW 02/04/2020 1250   APPEARANCEUR CLOUDY (A) 02/04/2020 1250   LABSPEC 1.017  02/04/2020 1250   PHURINE 5.0 02/04/2020 1250   GLUCOSEU NEGATIVE 02/04/2020 1250   HGBUR NEGATIVE 02/04/2020 1250   BILIRUBINUR NEGATIVE 02/04/2020 1250   KETONESUR NEGATIVE 02/04/2020 1250   PROTEINUR 30 (A) 02/04/2020 1250   UROBILINOGEN 0.2 10/29/2007 2036   NITRITE NEGATIVE 02/04/2020 1250   LEUKOCYTESUR NEGATIVE 02/04/2020 1250    Radiological Exams on Admission: DG Chest 2 View  Result Date: 02/04/2020 CLINICAL DATA:  Suspected sepsis EXAM: CHEST - 2 VIEW COMPARISON:  02/03/2020 FINDINGS: No new consolidation or edema. No pleural effusion or pneumothorax. Stable cardiomediastinal contours with normal heart size. No acute osseous abnormality. IMPRESSION: No acute process in the chest. Electronically Signed   By: Macy Mis M.D.   On: 02/04/2020 13:53   DG Chest 2 View  Result Date: 02/03/2020 CLINICAL DATA:  Fever EXAM: CHEST - 2 VIEW COMPARISON:  January 23, 2020 FINDINGS: The heart size and mediastinal contours are stable. Both lungs are clear. The visualized skeletal structures are unremarkable. IMPRESSION: No active cardiopulmonary disease. Electronically Signed   By: Abelardo Diesel M.D.   On: 02/03/2020 10:47   CT ABDOMEN PELVIS W CONTRAST  Result Date: 02/04/2020 CLINICAL DATA:  Liver mass, abdominal pain EXAM: CT ABDOMEN AND PELVIS WITH CONTRAST TECHNIQUE: Multidetector CT imaging of the abdomen and pelvis was performed using the standard protocol following bolus administration of intravenous contrast. CONTRAST:  169mL OMNIPAQUE IOHEXOL 300 MG/ML  SOLN COMPARISON:  MR abdomen pelvis, 01/21/2020, noncontrast CT abdomen pelvis, 01/21/2020 FINDINGS: Lower chest: No acute abnormality. Three-vessel coronary artery calcifications. Hepatobiliary: Redemonstrated peripherally enhancing hypodense mass of the inferior tip of the right lobe of the liver, hepatic segment VI, measuring approximately 2.5 cm on this examination, better characterized by prior multiphasic contrast enhanced MRI.  Gallstones in the dependent gallbladder. No gallbladder wall thickening, or biliary dilatation. Pancreas: Redemonstrated low-attenuation lesion of the pancreatic head adjacent to the common bile duct measuring 1.5 cm, again better characterized by prior multiphasic MRI. No pancreatic ductal dilatation or surrounding inflammatory changes. Spleen: Normal in size without significant abnormality. Adrenals/Urinary Tract: Adrenal glands are unremarkable. Kidneys are normal, without renal calculi, solid lesion, or hydronephrosis. Bladder is unremarkable. Stomach/Bowel: Stomach is within normal limits. Status  post appendectomy. Occasional sigmoid diverticula, with wall thickening and fat stranding about a prominent diverticulum of the mid sigmoid (series 2, image 92). Vascular/Lymphatic: Aortic atherosclerosis. No enlarged abdominal or pelvic lymph nodes. Reproductive: Status post prostatectomy. Other: No abdominal wall hernia or abnormality. No abdominopelvic ascites. Musculoskeletal: No acute or significant osseous findings. IMPRESSION: 1. Occasional sigmoid diverticula, with wall thickening and fat stranding about a prominent diverticulum of the mid sigmoid, consistent with acute diverticulitis. No evidence of perforation or abscess. 2. Redemonstrated peripherally enhancing hypodense mass of the inferior tip of the right lobe of the liver, hepatic segment VI, measuring approximately 2.5 cm on this examination, better characterized by prior multiphasic contrast enhanced MRI. 3. Redemonstrated low-attenuation lesion of the pancreatic head adjacent to the common bile duct measuring 1.5 cm, again better characterized by prior multiphasic MRI. 4. Status post prostatectomy. 5. Coronary artery disease.  Aortic Atherosclerosis (ICD10-I70.0). Electronically Signed   By: Eddie Candle M.D.   On: 02/04/2020 16:40    EKG: Independently reviewed.  Sinus rhythm, QTC 409.  No significant ST-T wave changes compared to prior  EKG.  Assessment/Plan Principal Problem:   Acute diverticulitis Active Problems:   Essential hypertension   Coronary atherosclerosis of native coronary artery   Bone metastases (HCC)   Prostate cancer --with bony metastases, status post prior prostatectomy in 2004    Acute diverticulitis-fever to 101.2, WBC 8.8, with normal lactic acid of 1.6.  Rules out for sepsis.  Abdominal CT findings consistent with acute diverticulitis.  4 episodes of loose stools this morning none since.  Ports dysuria, UA shows rare bacteria.  Covid test and two-view chest x-ray negative. - Bowel rest with clear liquid diet - IV ceftriaxone and metronidazole (with concern for possible C. difficile, avoiding quinolones at this time) -Follow-up blood and urine cultures - 1 L bolus given , cont N/s + 20 KCL 74cc/hr x 15hrs -GI consulted in ED, stool C. difficile, p.o. vancomycin, outpatient biopsy to be rescheduled.  Possible bacteremia- 1 culture bottle from yesterday growing gram-positive cocci.  Would explain fevers if not a contaminant. -Follow-up final blood cultures -Vancomycin pharmacy to dose  Hypertension-stable. -Resume metoprolol in the morning.  Prostate cancer with bone metastasis-status post prostatectomy 2004 -Zytiga- held -Resume home prednisone 5mg  BID  CKD 3-stable.  Creatinine 1.4.   History of coronary artery disease- history of multivessel disease status post CABG in 2000.  EKG Unchanged. -Resume statins, Eliquis.  History of atrial flutter-. -Resume metoprolol, Eliquis  DVT prophylaxis: Eliquis Code Status: Full code, problem list has DNR status, but patient says he does not want to be resuscitated for longer.  Hence full code. Family Communication: None at bedside. Disposition Plan: > 2 days, acute diverticulitis requiring IV antibiotics, need to rule out bacteremia. Consults called: Gastroenterology Admission status: Inpatient, telemetry I certify that at the point of admission  it is my clinical judgment that the patient will require inpatient hospital care spanning beyond 2 midnights from the point of admission due to high intensity of service, high risk for further deterioration and high frequency of surveillance required. The following factors support the patient status of inpatient: Requiring IV antibiotics.   Bethena Roys MD [facility]  02/04/2020, 9:37 PM

## 2020-02-04 NOTE — Progress Notes (Signed)
Pharmacy Antibiotic Note  Samuel Gregory is a 79 y.o. male admitted on 02/04/2020 with fever and chills x 24 hours, some pain with urination, 4 loose stools.  CT concerning for acute diverticulitis.  Also r/o cdiff, r/o bacteremia with 1/4 BCx showing GPC.  Pharmacy has been consulted for Vancomcyin dosing.  Plan: Vancomycin 1500 mg IV x 1 dose, then Vancomycin 1000 IV every 24 hours.  Goal trough 15-20 mcg/mL.  Rocephin 2gm IV q24h Flagyl 500mg  IV q8h Rule out cdiff Follow-up BCx  Height: 5\' 6"  (167.6 cm) Weight: 70.7 kg (155 lb 13.8 oz) IBW/kg (Calculated) : 63.8  Temp (24hrs), Avg:99.6 F (37.6 C), Min:98.1 F (36.7 C), Max:101.2 F (38.4 C)  Recent Labs  Lab 02/03/20 1003 02/03/20 1157 02/04/20 1331 02/04/20 1332  WBC 9.7  --  8.8  --   CREATININE 1.32*  --  1.43*  --   LATICACIDVEN 2.0* 1.0  --  1.6    Estimated Creatinine Clearance: 38.4 mL/min (A) (by C-G formula based on SCr of 1.43 mg/dL (H)).    Allergies  Allergen Reactions  . Latex Rash  . Sulfa Antibiotics Rash    Antimicrobials this admission: Cipro IV x 1 7/6 Vanc 7/6 >> Vanc PO 7/6 >> Rocephin 7/6 >> Flagyl 7/6 >>  Dose adjustments this admission:   Microbiology results: 7/6 BCx x 2 >> 7/5 UCx neg 7/5 BCx x 2 >> GPC in 1/4 bottles  Thank you for allowing pharmacy to be a part of this patient's care.  [employer], Pharm.D., BCPS Clinical Pharmacist  02/04/2020 9:52 PM

## 2020-02-04 NOTE — Consult Note (Signed)
Referring Provider: Veryl Speak, MD and Rayna Sexton, [facility] Primary Care Physician:  Celene Squibb, MD Primary Gastroenterologist:  Dr. Laural Golden  Reason for Consultation:    Fever and chills in a patient with recent hospitalization for streptococcal septicemia who also has new liver lesion.  HPI:   Patient is 79 year old Caucasian male with history of metastatic prostate carcinoma maintained on Lupron and Zytiga who was admitted to this facility on 01/21/2020 for fever and chills as well as worsening back hip and leg pain.  He was found to have cholelithiasis without evidence of choledocholithiasis(negative MRCP).  Urine cultures are negative but blood cultures positive for streptococcal species.  He responded to IV antibiotic.  He also had echocardiogram for SVT and no vegetations were seen.  Regarding the liver lesion plans were made for him to undergo CT-guided biopsy on 02/07/2020.  Patient was also found to have a pancreatic lesion which has been stable for many years and felt to be irrelevant to his acute illness.  Patient's transaminases improved during hospitalization. Patient finished cefdinir on 01/29/2020. Patient did all right except for feeling tired last week.  Yesterday morning he had low-grade fever and chills.  He took Tylenol.  He came to the emergency room.  Evaluation revealed normal WBC is normal AST and ALT.  Lactic acid level was 2.  Was repeated 2 hours later and was down to 1. Urinalysis revealed 0-5 WBCs and 0-5 RBCs and protein at 30 mg/dL.  Urine and blood cultures were obtained and patient was discharged. Patient had another spell of fever and chills.  His wife called our office and I recommended that patient be brought back to the emergency room for further evaluation.  I was concerned that liver lesion might be an abscess.  Once again patient CBC and comprehensive respiratory panel was unremarkable except creatinine of 1.43 and serum albumin of 3.0. Abdominal pelvic CT was  obtained with contrast.  This study revealed peripherally enhancing lesion in hepatic segment 6 measuring 2.5 cm.  Once again it showed pancreatic head lesion and new finding of sigmoid diverticula with wall thickening and stranding and a prominent diverticulum in the sigmoid colon felt to be consistent with acute diverticulitis.  There was no abscess or pneumoperitoneum.  Patient begun on Cipro and metronidazole IV.  Patient reports very mild transient hypogastric pain which is noted when he coughs.  His wife states that he had 4 loose stools this morning.  No melena or rectal bleeding.  He did not experience nausea vomiting chest pain or shortness of breath.  He denies dysuria or hematuria. Back hip and leg pain has decreased because of bedrest.  Is under care of orthopedic surgeon in [facility].  Patient is married. He has 2 grownup children.  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  Even though he is retired he was ill doing some work until his back and hip pain got worse.  He has never smoked cigarettes.  He drinks 2 drinks per day. Father died of ruptured AAA at age 65.  Mother had some type of cancer lived to be 53.  He has a brother age 52 who is also being treated for metastatic prostate carcinoma.  He had a half sister who died of bladder cancer at age 79.   Past Medical History:  Diagnosis Date  . Arthritis   . Collagen vascular disease ([place])   . Coronary atherosclerosis of native coronary artery    Multivessel status  post CABG 2000  . Essential hypertension   . Hyperlipidemia   . Prostate cancer [facility])        Cholelithiasis     Pancreatic head lesion most likely IPMN stable for more than 10 years.  Past Surgical History:  Procedure Laterality Date  . APPENDECTOMY     8 yrs ago- Dr Geroge Baseman  . CATARACT EXTRACTION W/PHACO Left 07/14/2014   Procedure: CATARACT EXTRACTION PHACO AND INTRAOCULAR LENS PLACEMENT (IOC);  Surgeon: Tonny Branch, MD;   Location: [facility]S;  Service: Ophthalmology;  Laterality: Left;  CDE:6.20  . CATARACT EXTRACTION W/PHACO Right 08/11/2014   Procedure: CATARACT EXTRACTION PHACO AND INTRAOCULAR LENS PLACEMENT RIGHT EYE;  Surgeon: Tonny Branch, MD;  Location: [facility];  Service: Ophthalmology;  Laterality: Right;  CDE:10.11  . COLONOSCOPY N/A 01/14/2015   Procedure: COLONOSCOPY;  Surgeon: Rogene Houston, MD;  Location: [facility] END[facility] SUITE;  Service: Endoscopy;  Laterality: N/A;  730  . CORONARY ARTERY BYPASS GRAFT  01/1999   LIMA to LAD, SVG to diagonal, sequential SVG to PDA and PLB  . PROSTATECTOMY  2004   [name]  . TONSILLECTOMY     age 79    Prior to Admission medications   Medication Sig Start Date End Date Taking? Authorizing Provider  apixaban (ELIQUIS) 5 MG TABS tablet Take 1 tablet (5 mg total) by mouth 2 (two) times daily. 01/27/20 03/27/20  Murlean Iba, MD  Calcium-Magnesium-Vitamin D (CALCIUM 1200+D3 PO) Take 1 tablet by mouth daily.     [provider]  fluticasone (FLONASE) 50 MCG/ACT nasal spray Place 1 spray into both nostrils daily as needed for allergies or rhinitis.    [provider]  methocarbamol (ROBAXIN) 750 MG tablet Take 750 mg by mouth 3 (three) times daily. 11/18/19   [provider]  Metoprolol Tartrate 75 MG TABS Take 75 mg by mouth 2 (two) times daily. 01/27/20 03/27/20  Murlean Iba, MD  Multiple Vitamin (MULTIVITAMIN WITH MINERALS) TABS Take 1 tablet by mouth daily.    [provider]  nitroGLYCERIN (NITROSTAT) 0.4 MG SL tablet Place 1 tablet (0.4 mg total) under the tongue every 5 (five) minutes x 3 doses as needed for chest pain (if no relief after 3rd dose, proceed to the ED for an evaluation or call 911). 12/06/18 01/31/20  Satira Sark, MD  predniSONE (DELTASONE) 5 MG tablet Take 5 mg by mouth 2 (two) times daily.  09/20/16   [provider]  rosuvastatin (CRESTOR) 40 MG tablet Take 1 tablet (40 mg total) by mouth daily  after supper. 11/29/19 02/27/20  Satira Sark, MD  Thiamine HCl (VITAMIN B-1 PO) Take 1 tablet by mouth daily.    [provider]  ZYTIGA 250 MG tablet Take 4 tablets by mouth daily. 09/20/16   [provider]    Current Facility-Administered Medications  Medication Dose Route Frequency Provider Last Rate Last Admin  . vancomycin (VANCOCIN) 50 mg/mL oral solution 125 mg  125 mg Oral QID Rogene Houston, MD   125 mg at 02/04/20 1710   Current Outpatient Medications  Medication Sig Dispense Refill  . apixaban (ELIQUIS) 5 MG TABS tablet Take 1 tablet (5 mg total) by mouth 2 (two) times daily. 60 tablet 1  . Calcium-Magnesium-Vitamin D (CALCIUM 1200+D3 PO) Take 1 tablet by mouth daily.     . fluticasone (FLONASE) 50 MCG/ACT nasal spray Place 1 spray into both nostrils daily as needed for allergies or rhinitis.    Marland Kitchen  methocarbamol (ROBAXIN) 750 MG tablet Take 750 mg by mouth 3 (three) times daily.    . Metoprolol Tartrate 75 MG TABS Take 75 mg by mouth 2 (two) times daily. 60 tablet 1  . Multiple Vitamin (MULTIVITAMIN WITH MINERALS) TABS Take 1 tablet by mouth daily.    . nitroGLYCERIN (NITROSTAT) 0.4 MG SL tablet Place 1 tablet (0.4 mg total) under the tongue every 5 (five) minutes x 3 doses as needed for chest pain (if no relief after 3rd dose, proceed to the ED for an evaluation or call 911). 25 tablet 3  . pantoprazole (PROTONIX) 40 MG tablet Take 40 mg by mouth daily.    . predniSONE (DELTASONE) 5 MG tablet Take 5 mg by mouth 2 (two) times daily.   11  . rosuvastatin (CRESTOR) 40 MG tablet Take 1 tablet (40 mg total) by mouth daily after supper. 90 tablet 3  . Thiamine HCl (VITAMIN B-1 PO) Take 1 tablet by mouth daily.    . traMADol (ULTRAM) 50 MG tablet Take 50 mg by mouth every 6 (six) hours as needed for moderate pain or severe pain.     Marland Kitchen ZYTIGA 250 MG tablet Take 4 tablets by mouth daily.  11    Allergies as of 02/04/2020 - Review Complete 02/04/2020  Allergen  Reaction Noted  . Latex Rash 01/02/2015  . Sulfa antibiotics Rash 10/30/2012    Family History  Problem Relation Age of Onset  . Heart attack Mother   . Cancer Mother   . Aneurysm Father     Social History   Socioeconomic History  . Marital status: Married    Spouse name: Not on file  . Number of children: Not on file  . Years of education: Not on file  . Highest education level: Not on file  Occupational History  . Not on file  Tobacco Use  . Smoking status: Never Smoker  . Smokeless tobacco: Never Used  Vaping Use  . Vaping Use: Never used  Substance and Sexual Activity  . Alcohol use: Yes    Alcohol/week: 0.0 standard drinks    Comment: daily-glass of wine  . Drug use: No  . Sexual activity: Not on file  Other Topics Concern  . Not on file  Social History Narrative  . Not on file   Social Determinants of Health   Financial Resource Strain:   . Difficulty of Paying Living Expenses:   Food Insecurity:   . Worried About Charity fundraiser in the Last Year:   . Arboriculturist in the Last Year:   Transportation Needs:   . Film/video editor (Medical):   Marland Kitchen Lack of Transportation (Non-Medical):   Physical Activity:   . Days of Exercise per Week:   . Minutes of Exercise per Session:   Stress:   . Feeling of Stress :   Social Connections:   . Frequency of Communication with Friends and Family:   . Frequency of Social Gatherings with Friends and Family:   . Attends Religious Services:   . Active Member of Clubs or Organizations:   . Attends Archivist Meetings:   Marland Kitchen Marital Status:   Intimate Partner Violence:   . Fear of Current or Ex-Partner:   . Emotionally Abused:   Marland Kitchen Physically Abused:   . Sexually Abused:     Review of Systems: See HPI, otherwise normal ROS  Physical Exam: Temp:  [98.1 F (36.7 C)-101.2 F (38.4 C)] 98.1 F (36.7 C) (  07/06 1711) Pulse Rate:  [82] 82 (07/06 1239) Resp:  [17] 17 (07/06 1239) BP:  (106-114)/(63-67) 114/63 (07/06 1553) SpO2:  [95 %] 95 % (07/06 1239) Weight:  [76 kg] 76 kg (07/06 1241)   Patient is alert and does not appear to be acutely ill. He has round facies. Conjunctivae is pink.  Sclerae nonicteric. Oropharyngeal mucosa is normal.  Dentition in satisfactory condition. No neck masses or thyromegaly noted. Cardiac exam with regular rhythm normal S1 and S2.  No murmur or gallop noted. Auscultation lungs reveal vesicular breath sounds bilaterally. Abdomen is symmetrical.  He has lower midline scar and 2 large ecchymosis one in each iliac fossa.  Bowel sounds are normal.  Palpation reveals soft abdomen with mild tenderness in hypogastric region on deep palpation.  No guarding.  Organomegaly or masses. He has long scar along anteromedial aspect of right leg.  He has trace edema involving left ankle and 1+ edema involving right leg. Lab Results: Recent Labs    02/03/20 1003 02/04/20 1331  WBC 9.7 8.8  HGB 11.7* 11.5*  HCT 37.4* 36.0*  PLT 375 357   BMET Recent Labs    02/03/20 1003 02/04/20 1331  NA 137 131*  K 3.8 3.6  CL 100 97*  CO2 26 24  GLUCOSE 97 98  BUN 24* 21  CREATININE 1.32* 1.43*  CALCIUM 9.6 9.0   LFT Recent Labs    02/04/20 1331  PROT 6.3*  ALBUMIN 3.0*  AST 26  ALT 33  ALKPHOS 78  BILITOT 1.1   PT/INR Recent Labs    02/04/20 1331  LABPROT 15.1  INR 1.2    Studies/Results: DG Chest 2 View  Result Date: 02/04/2020 CLINICAL DATA:  Suspected sepsis EXAM: CHEST - 2 VIEW COMPARISON:  02/03/2020 FINDINGS: No new consolidation or edema. No pleural effusion or pneumothorax. Stable cardiomediastinal contours with normal heart size. No acute osseous abnormality. IMPRESSION: No acute process in the chest. Electronically Signed   By: Macy Mis M.D.   On: 02/04/2020 13:53   DG Chest 2 View  Result Date: 02/03/2020 CLINICAL DATA:  Fever EXAM: CHEST - 2 VIEW COMPARISON:  January 23, 2020 FINDINGS: The heart size and mediastinal  contours are stable. Both lungs are clear. The visualized skeletal structures are unremarkable. IMPRESSION: No active cardiopulmonary disease. Electronically Signed   By: Abelardo Diesel M.D.   On: 02/03/2020 10:47   CT ABDOMEN PELVIS W CONTRAST  Result Date: 02/04/2020 CLINICAL DATA:  Liver mass, abdominal pain EXAM: CT ABDOMEN AND PELVIS WITH CONTRAST TECHNIQUE: Multidetector CT imaging of the abdomen and pelvis was performed using the standard protocol following bolus administration of intravenous contrast. CONTRAST:  111mL OMNIPAQUE IOHEXOL 300 MG/ML  SOLN COMPARISON:  MR abdomen pelvis, 01/21/2020, noncontrast CT abdomen pelvis, 01/21/2020 FINDINGS: Lower chest: No acute abnormality. Three-vessel coronary artery calcifications. Hepatobiliary: Redemonstrated peripherally enhancing hypodense mass of the inferior tip of the right lobe of the liver, hepatic segment VI, measuring approximately 2.5 cm on this examination, better characterized by prior multiphasic contrast enhanced MRI. Gallstones in the dependent gallbladder. No gallbladder wall thickening, or biliary dilatation. Pancreas: Redemonstrated low-attenuation lesion of the pancreatic head adjacent to the common bile duct measuring 1.5 cm, again better characterized by prior multiphasic MRI. No pancreatic ductal dilatation or surrounding inflammatory changes. Spleen: Normal in size without significant abnormality. Adrenals/Urinary Tract: Adrenal glands are unremarkable. Kidneys are normal, without renal calculi, solid lesion, or hydronephrosis. Bladder is unremarkable. Stomach/Bowel: Stomach is within normal limits. Status post  appendectomy. Occasional sigmoid diverticula, with wall thickening and fat stranding about a prominent diverticulum of the mid sigmoid (series 2, image 92). Vascular/Lymphatic: Aortic atherosclerosis. No enlarged abdominal or pelvic lymph nodes. Reproductive: Status post prostatectomy. Other: No abdominal wall hernia or abnormality.  No abdominopelvic ascites. Musculoskeletal: No acute or significant osseous findings. IMPRESSION: 1. Occasional sigmoid diverticula, with wall thickening and fat stranding about a prominent diverticulum of the mid sigmoid, consistent with acute diverticulitis. No evidence of perforation or abscess. 2. Redemonstrated peripherally enhancing hypodense mass of the inferior tip of the right lobe of the liver, hepatic segment VI, measuring approximately 2.5 cm on this examination, better characterized by prior multiphasic contrast enhanced MRI. 3. Redemonstrated low-attenuation lesion of the pancreatic head adjacent to the common bile duct measuring 1.5 cm, again better characterized by prior multiphasic MRI. 4. Status post prostatectomy. 5. Coronary artery disease.  Aortic Atherosclerosis (ICD10-I70.0). Electronically Signed   By: Eddie Candle M.D.   On: 02/04/2020 16:40   I have reviewed to CT images and MR with Dr. Thornton Papas and she her CT with patient's wife who is at bedside.  Assessment;  #1.  Fever and chills.  Patient was recently hospitalized for streptococcal sepsis thought to be originating from cholangitis given that he had elevated transaminases and cholelithiasis.  It was felt he must have passed stone spontaneously.  He responded to antibiotic.  He now returns with recurrence of fever and chills since yesterday.  Urine and blood cultures from yesterday remain negative.  CT now reveals changes of sigmoid diverticulitis.  He has marked inflammatory changes but no air-fluid level or pneumoperitoneum.  However he had 4 loose stools today.  Therefore need to rule out C. difficile colitis.  We may be dealing with C. difficile colitis mimicking diverticulitis.  #2.  Peripheral enhancing lesion involving hepatic segment 6 concerning for neoplasm.  This lesion appears to have decreased in size on enhanced study.  Patient is scheduled for CT-guided biopsy in 3 days and it may have to be postponed given patient's  acute illness.  #3.  Chronic/stable pancreatic head lesion most likely due to sidebranch IPMN.  No further evaluation warranted at this time.  #4.  Cholelithiasis.  No changes to suggest acute cholecystitis.  #5.  History of metastatic prostate carcinoma stable on dual therapy.  Recommendations;  Continue Cipro and metronidazole IV. Will request stool studies for C. difficile antigen and toxin and if antigen is positive and toxin is negative we will do follow-up PCR testing. Begin oral vancomycin at a dose of 125 mg p.o. 4 times daily. Will need to postpone liver biopsy until acute condition resolved. No NSAIDs.   LOS: 0 days   Aariyah Sampey  02/04/2020, 6:07 PM

## 2020-02-05 LAB — COMPREHENSIVE METABOLIC PANEL
ALT: 24 U/L (ref 0–44)
AST: 18 U/L (ref 15–41)
Albumin: 2.2 g/dL — ABNORMAL LOW (ref 3.5–5.0)
Alkaline Phosphatase: 60 U/L (ref 38–126)
Anion gap: 7 (ref 5–15)
BUN: 18 mg/dL (ref 8–23)
CO2: 23 mmol/L (ref 22–32)
Calcium: 8 mg/dL — ABNORMAL LOW (ref 8.9–10.3)
Chloride: 103 mmol/L (ref 98–111)
Creatinine, Ser: 1.29 mg/dL — ABNORMAL HIGH (ref 0.61–1.24)
GFR calc Af Amer: >60 mL/min (ref >60)
GFR calc non Af Amer: 53 mL/min — ABNORMAL LOW (ref >60)
Glucose, Bld: 155 mg/dL — ABNORMAL HIGH (ref 70–99)
Potassium: 3 mmol/L — ABNORMAL LOW (ref 3.5–5.1)
Sodium: 133 mmol/L — ABNORMAL LOW (ref 135–145)
Total Bilirubin: 0.7 mg/dL (ref 0.3–1.2)
Total Protein: 4.8 g/dL — ABNORMAL LOW (ref 6.5–8.1)

## 2020-02-05 LAB — BLOOD CULTURE ID PANEL (REFLEXED)

## 2020-02-05 LAB — CULTURE, BLOOD (ROUTINE X 2): Special Requests: ADEQUATE

## 2020-02-05 LAB — CBC
HCT: 30.3 % — ABNORMAL LOW (ref 39.0–52.0)
Hemoglobin: 9.7 g/dL — ABNORMAL LOW (ref 13.0–17.0)
MCH: 31.1 pg (ref 26.0–34.0)
MCHC: 32 g/dL (ref 30.0–36.0)
MCV: 97.1 fL (ref 80.0–100.0)
Platelets: 305 10*3/uL (ref 150–400)
RBC: 3.12 MIL/uL — ABNORMAL LOW (ref 4.22–5.81)
RDW: 14.4 % (ref 11.5–15.5)
WBC: 6.3 10*3/uL (ref 4.0–10.5)
nRBC: 0 % (ref 0.0–0.2)

## 2020-02-05 LAB — C DIFFICILE QUICK SCREEN W PCR REFLEX
C Diff antigen: POSITIVE — AB
C Diff interpretation: DETECTED
C Diff toxin: POSITIVE — AB

## 2020-02-05 MED ORDER — SACCHAROMYCES BOULARDII 250 MG PO CAPS
250.0000 mg | ORAL_CAPSULE | Freq: Two times a day (BID) | ORAL | Status: DC
  Administered 2020-02-05 – 2020-02-06 (×2): 250 mg via ORAL
  Filled 2020-02-05 (×2): qty 1

## 2020-02-05 MED ORDER — BOOST / RESOURCE BREEZE PO LIQD CUSTOM
1.0000 | Freq: Two times a day (BID) | ORAL | Status: DC
  Administered 2020-02-05: 1 via ORAL

## 2020-02-05 MED ORDER — POTASSIUM CHLORIDE CRYS ER 20 MEQ PO TBCR
40.0000 meq | EXTENDED_RELEASE_TABLET | ORAL | Status: AC
  Administered 2020-02-05 (×2): 40 meq via ORAL
  Filled 2020-02-05 (×2): qty 2

## 2020-02-05 MED ORDER — ENSURE ENLIVE PO LIQD
237.0000 mL | Freq: Two times a day (BID) | ORAL | Status: DC
  Administered 2020-02-05 – 2020-02-06 (×2): 237 mL via ORAL

## 2020-02-05 NOTE — Progress Notes (Signed)
Initial Nutrition Assessment  DOCUMENTATION CODES:   Not applicable  INTERVENTION:  Decrease Boost Breeze po BID, each supplement provides 250 kcal and 9 grams of protein (pt likes peach) Ensure Enlive po BID, each supplement provides 350 kcal and 20 grams of protein (strawberry) Advance diet as medically feasible  Recommend monitoring magnesium, potassium, and phosphorus daily for at least 3 days, MD to replete as needed, as pt is at risk for refeeding syndrome given noted hypokalemia, 2-3 days poor po intake per pt report.   NUTRITION DIAGNOSIS:   Inadequate oral intake related to acute illness (acute diverticulitis) as evidenced by per patient/family report, energy intake < or equal to 75% for > or equal to 1 month.  GOAL:   Patient will meet greater than or equal to 90% of their needs    MONITOR:   PO intake, Supplement acceptance, Labs, Weight trends, I & O's, Diet advancement  REASON FOR ASSESSMENT:   Malnutrition Screening Tool    ASSESSMENT:  79 year old male with past medical history significant for prostate cancer metastatic to bone, CAD, HTN, atrial flutter, recently hospitalized 6/22-6/28 for severe sepsis secondary to strep bacteremia, presented with complaints of fever with chills and 4 episodes of loose stools admitted for acute diverticulitis.  Patient awake, alert this morning, wife at bedside. Patient reports feeling better today, recalls tolerating clear liquid breakfast. Patient reports having 2 loose stools today. Patient is ordered Boost Breeze TID, patient reports he has not received supplement today, would like to try peach flavor. Diet advanced to full liquids this afternoon. Will order Ensure Enlive supplement and decrease Boost Breeze to twice daily, to aid with meeting needs. Patient recalls normally having good appetite and intake at home. Over the past few weeks he reports decreased po intake secondary to recent hospitalization, recalls CLD during  prior admission as well as little po intake over the last 2-3 days. Given dietary recall, patient is at risk for refeeding, noted hypokalemia, recommend monitoring electrolytes.   Current wt 156 lb Per chart, weights have trended down 10.6 lb (6.4%) in 3 weeks which is significant. Suspect some loss related to fluid shifts secondary to noted +2 BLE edema as well as IVF received during 6/22-6/28 admission. Stable history, 160-166 lb over the past 2 years.   I/Os: -182 ml since admit UOP: 900 ml since admit  Medications reviewed and include: Klor-con, Prednisone, Vancomycin IVF: NaCl with KCl 20 mEq/L @ 75 ml/hr IVPB: Rocephin, Flagyl Labs: Na 133 (L), K 3.0 (L), Hgb 9.7 -C Diff antigen;toxin positive  NUTRITION - FOCUSED PHYSICAL EXAM: Mild orbital fat depletion  Diet Order:   Diet Order            Diet full liquid Room service appropriate? Yes; Fluid consistency: Thin  Diet effective now                 EDUCATION NEEDS:   No education needs have been identified at this time  Skin:  Skin Assessment: Reviewed RN Assessment  Last BM:  7/6 type 7  Height:   Ht Readings from Last 1 Encounters:  02/04/20 5\' 6"  (1.676 m)    Weight:   Wt Readings from Last 1 Encounters:  02/04/20 70.7 kg    Ideal Body Weight:  64.55 kg  BMI:  Body mass index is 25.16 kg/m.  Estimated Nutritional Needs:   Kcal:  6333-5456  Protein:  95-106  Fluid:  >1.7 L   Lajuan Lines, RD, LDN Clinical Nutrition  After Hours/Weekend Pager # in [employer]

## 2020-02-05 NOTE — Progress Notes (Signed)
Patient Demographics:    Coulton Schlink, is a 79 y.o. male, DOB - 1940/12/30, OFB:[id number]  Admit date - 02/04/2020   Admitting Physician Ejiroghene Arlyce Dice, MD  Outpatient Primary MD for the patient is Celene Squibb, MD  LOS - 1   Chief Complaint  Patient presents with  . Abnormal Lab        Subjective:    Ramond Darnell today has  no emesis,  No chest pain,   --Wife at bedside, watery stools persist -Tolerating liquid diet well -Nausea persist, fever resolving - fevers, nausea poor oral intake and diarrhea with risk for dehydration -Hopefully can be discharged home when frequency and volume of stools improve   Assessment  & Plan :    Principal Problem:   Acute diverticulitis Active Problems:   Prostate cancer --with bony metastases, status post prior prostatectomy in 2004   Essential hypertension   Coronary atherosclerosis of native coronary artery   Bone metastases (HCC)   1) C. difficile colitis--- p.o. Vanco as ordered , concomitant acute diverticulitis deemed to be less likely and after further review of imaging studies  -GI input appreciated ---discontinue Rocephin and Flagyl -Unable to discharge home until  tolerating oral intake and frequency and volume of stools does not put patient at risk for significant dehydration, electrolyte abnormalities and AKI  2)HTN-stable continue metoprolol  3)Possible gram-positive bacteremia----blood cultures from 02/03/2020 with ?? MICROCOCCUS LUTEUS/LYLAE -- --which is probably a contaminant -Repeat blood cultures from 02/04/2020 without growth  4) history of metastatic prostate cancer with mets to the bone-status post prior prostatectomy in 2004, okay to continue prednisone and Zytiga  5)paroxysmal atrial flutter-stable, continue metoprolol 75 mg twice daily for rate control and Eliquis for anticoagulation  6) multivessel CAD--stable, chest pain-free,  asymptomatic, status post prior CABG in 2000, continue current regimen including statin, metoprolol  7) CKD stage III--- creatinine currently 1.29 which is at a better than recent baseline - renally adjust medications, avoid nephrotoxic agents / dehydration  / hypotension  Disposition/Need for in-Hospital Stay- patient unable to be discharged at this time due to --- fevers, nausea poor oral intake and diarrhea with risk for dehydration -Hopefully can be discharged home when frequency and volume of stools improve  Status is: Inpatient  Remains inpatient appropriate because: fevers, nausea poor oral intake and diarrhea with risk for dehydration   Disposition: The patient is from: Home              Anticipated d/c is to: Home              Anticipated d/c date is: 2 days              Patient currently is not medically stable to d/c. Barriers: Not Clinically Stable- - fevers, nausea poor oral intake and diarrhea with risk for dehydration -Hopefully can be discharged home when frequency and volume of stools improve  Code Status : full code  Family Communication:    (patient is alert, awake and coherent)  -Discussed with patient's wife at bedside  Consults  :  Gi  DVT Prophylaxis  : Eliquis  Lab Results  Component Value Date   PLT 305 02/05/2020    Inpatient Medications  Scheduled Meds: . apixaban  5 mg Oral BID  . feeding supplement  1 Container Oral BID BM  . feeding supplement (ENSURE ENLIVE)  237 mL Oral BID AC  . metoprolol tartrate  75 mg Oral BID  . predniSONE  5 mg Oral BID  . rosuvastatin  40 mg Oral QPC supper  . saccharomyces boulardii  250 mg Oral BID  . vancomycin  125 mg Oral QID   Continuous Infusions: PRN Meds:.acetaminophen **OR** acetaminophen, ondansetron **OR** ondansetron (ZOFRAN) IV, polyethylene glycol    Anti-infectives (From admission, onward)   Start     Dose/Rate Route Frequency Ordered Stop   02/05/20 2200  vancomycin (VANCOCIN) IVPB 1000  mg/200 mL premix  Status:  Discontinued       "Followed by" Linked Group Details   1,000 mg 200 mL/hr over 60 Minutes Intravenous Every 24 hours 02/04/20 2153 02/05/20 1001   02/05/20 0000  metroNIDAZOLE (FLAGYL) IVPB 500 mg  Status:  Discontinued        500 mg 100 mL/hr over 60 Minutes Intravenous Every 8 hours 02/04/20 2136 02/05/20 1541   02/04/20 2200  cefTRIAXone (ROCEPHIN) 2 g in sodium chloride 0.9 % 100 mL IVPB  Status:  Discontinued        2 g 200 mL/hr over 30 Minutes Intravenous Every 24 hours 02/04/20 2136 02/05/20 1541   02/04/20 2200  vancomycin (VANCOREADY) IVPB 1500 mg/300 mL       "Followed by" Linked Group Details   1,500 mg 150 mL/hr over 120 Minutes Intravenous  Once 02/04/20 2153 02/05/20 0122   02/04/20 1800  vancomycin (VANCOCIN) 50 mg/mL oral solution 125 mg     Discontinue     125 mg Oral 4 times daily 02/04/20 1640 02/14/20 1759   02/04/20 1630  ciprofloxacin (CIPRO) IVPB 400 mg       "And" Linked Group Details   400 mg 200 mL/hr over 60 Minutes Intravenous  Once 02/04/20 1624 02/04/20 1904   02/04/20 1630  metroNIDAZOLE (FLAGYL) IVPB 500 mg       "And" Linked Group Details   500 mg 100 mL/hr over 60 Minutes Intravenous  Once 02/04/20 1624 02/04/20 1904        Objective:   Vitals:   02/04/20 2048 02/05/20 0018 02/05/20 0511 02/05/20 1000  BP: 139/83 105/65 97/67 131/75  Pulse: 77 70 60 66  Resp: 20 20 20    Temp: 98.3 F (36.8 C) (!) 100.6 F (38.1 C) 99.4 F (37.4 C) 98.2 F (36.8 C)  TempSrc: Oral Oral Oral Oral  SpO2: 99% 98% 95% 99%  Weight: 70.7 kg     Height: 5\' 6"  (1.676 m)       Wt Readings from Last 3 Encounters:  02/04/20 70.7 kg  02/03/20 76.7 kg  01/25/20 75.5 kg     Intake/Output Summary (Last 24 hours) at 02/05/2020 1944 Last data filed at 02/05/2020 0920 Gross per 24 hour  Intake 957.69 ml  Output 900 ml  Net 57.69 ml     Physical Exam  Gen:- Awake Alert,  In no apparent distress  HEENT:- [place].AT, No sclera  icterus Neck-Supple Neck,No JVD,.  Lungs-  CTAB , fair symmetrical air movement CV- S1, S2 normal, regular , prior sternotomy scar Abd-  +ve B.Sounds, Abd Soft, lower abdominal tenderness without rebound or guarding Extremity/Skin:- No  edema, pedal pulses present  Psych-affect is appropriate, oriented x3 Neuro-no new focal deficits, no tremors   Data Review:   Micro Results Recent Results (from the past 240 hour(s))  Culture, blood (routine x 2)     Status: None (Preliminary result)   Collection Time: 02/03/20 10:03 AM   Specimen: Left Antecubital; Blood  Result Value Ref Range Status   Specimen Description LEFT ANTECUBITAL  Final   Special Requests   Final    BOTTLES DRAWN AEROBIC AND ANAEROBIC Blood Culture adequate volume   Culture   Final    NO GROWTH 2 DAYS Performed at [facility], [address] [place], [place] [address]    Report Status PENDING  Incomplete  Culture, blood (routine x 2)     Status: Abnormal   Collection Time: 02/03/20 10:03 AM   Specimen: Right Antecubital; Blood  Result Value Ref Range Status   Specimen Description   Final    RIGHT ANTECUBITAL Performed at [facility], [address] [place], [place] [address]    Special Requests   Final    BOTTLES DRAWN AEROBIC AND ANAEROBIC Blood Culture adequate volume Performed at [facility], [address], [place], [place] [address]    Culture  Setup Time   Final    GRAM POSITIVE COCCI Gram Stain Report Called to,Read Back By and Verified With: BELTON,C ON 02/04/20 AT 1955 BY LOY,C AEROBIC BOTTLE PERFORMED AT [facility] Organism ID to follow    Culture (A)  Final    MICROCOCCUS LUTEUS/LYLAE Standardized susceptibility testing for this organism is not available. Performed at [facility], [place] [address], [place], [place] [address]    Report Status 02/05/2020 FINAL  Final  Blood Culture ID Panel (Reflexed)     Status: None   Collection Time: 02/03/20 10:03 AM  Result Value Ref Range Status    Enterococcus species NOT DETECTED NOT DETECTED Final   Listeria monocytogenes NOT DETECTED NOT DETECTED Final   Staphylococcus species NOT DETECTED NOT DETECTED Final   Staphylococcus aureus (BCID) NOT DETECTED NOT DETECTED Final   Streptococcus species NOT DETECTED NOT DETECTED Final   Streptococcus agalactiae NOT DETECTED NOT DETECTED Final   Streptococcus pneumoniae NOT DETECTED NOT DETECTED Final   Streptococcus pyogenes NOT DETECTED NOT DETECTED Final   Acinetobacter baumannii NOT DETECTED NOT DETECTED Final   Enterobacteriaceae species NOT DETECTED NOT DETECTED Final   Enterobacter cloacae complex NOT DETECTED NOT DETECTED Final   Escherichia coli NOT DETECTED NOT DETECTED Final   Klebsiella oxytoca NOT DETECTED NOT DETECTED Final   Klebsiella pneumoniae NOT DETECTED NOT DETECTED Final   Proteus species NOT DETECTED NOT DETECTED Final   Serratia marcescens NOT DETECTED NOT DETECTED Final   Haemophilus influenzae NOT DETECTED NOT DETECTED Final   Neisseria meningitidis NOT DETECTED NOT DETECTED Final   Pseudomonas aeruginosa NOT DETECTED NOT DETECTED Final   Candida albicans NOT DETECTED NOT DETECTED Final   Candida glabrata NOT DETECTED NOT DETECTED Final   Candida krusei NOT DETECTED NOT DETECTED Final   Candida parapsilosis NOT DETECTED NOT DETECTED Final   Candida tropicalis NOT DETECTED NOT DETECTED Final    Comment: Performed at [facility], [place] [address] [place], [place] [address]  Urine culture     Status: None   Collection Time: 02/03/20 12:00 PM   Specimen: Urine, Clean Catch  Result Value Ref Range Status   Specimen Description   Final    URINE, CLEAN CATCH Performed at [facility], [address] [place], [place]  [address]    Special Requests   Final    NONE Performed at [facility], [address], [place], [place] [address]  Culture   Final    NO GROWTH Performed at [facility], [place] [address], [place], [place] [address]    Report Status  02/04/2020 FINAL  Final  Culture, blood (Routine x 2)     Status: None (Preliminary result)   Collection Time: 02/04/20  1:32 PM   Specimen: Right Antecubital; Blood  Result Value Ref Range Status   Specimen Description RIGHT ANTECUBITAL  Final   Special Requests   Final    BOTTLES DRAWN AEROBIC AND ANAEROBIC Blood Culture adequate volume   Culture   Final    NO GROWTH < 24 HOURS Performed at [facility], [address], [place], [place] [address]    Report Status PENDING  Incomplete  Culture, blood (Routine x 2)     Status: None (Preliminary result)   Collection Time: 02/04/20  2:20 PM   Specimen: Left Antecubital; Blood  Result Value Ref Range Status   Specimen Description LEFT ANTECUBITAL  Final   Special Requests   Final    BOTTLES DRAWN AEROBIC AND ANAEROBIC Blood Culture adequate volume   Culture   Final    NO GROWTH < 24 HOURS Performed at [facility], [address] [place], [place] [address]    Report Status PENDING  Incomplete  SARS Coronavirus 2 by RT PCR (hospital order, performed in [place] hospital lab) Nasopharyngeal Nasopharyngeal Swab     Status: None   Collection Time: 02/04/20  4:25 PM   Specimen: Nasopharyngeal Swab  Result Value Ref Range Status   SARS Coronavirus 2 NEGATIVE NEGATIVE Final    Comment: (NOTE) SARS-CoV-2 target nucleic acids are NOT DETECTED.  The SARS-CoV-2 RNA is generally detectable in upper and lower respiratory specimens during the acute phase of infection. The lowest concentration of SARS-CoV-2 viral copies this assay can detect is 250 copies / mL. A negative result does not preclude SARS-CoV-2 infection and should not be used as the sole basis for treatment or other patient management decisions.  A negative result may occur with improper specimen collection / handling, submission of specimen other than nasopharyngeal swab, presence of viral mutation(s) within the areas targeted by this assay, and inadequate number of viral  copies (<250 copies / mL). A negative result must be combined with clinical observations, patient history, and epidemiological information.  Fact Sheet for Patients:   StrictlyIdeas.no  Fact Sheet for Healthcare Providers: BankingDealers.co.za  This test is not yet approved or  cleared by the [place] FDA and has been authorized for detection and/or diagnosis of SARS-CoV-2 by FDA under an Emergency Use Authorization (EUA).  This EUA will remain in effect (meaning this test can be used) for the duration of the COVID-19 declaration under Section 564(b)(1) of the Act, 21 U.S.C. section 360bbb-3(b)(1), unless the authorization is terminated or revoked sooner.  Performed at [facility], [address], [place], [place] [address]   C Difficile Quick Screen w PCR reflex     Status: Abnormal   Collection Time: 02/05/20  6:35 AM   Specimen: STOOL  Result Value Ref Range Status   C Diff antigen POSITIVE (A) NEGATIVE Final   C Diff toxin POSITIVE (A) NEGATIVE Final   C Diff interpretation Toxin producing C. difficile detected.  Final    Comment: CRITICAL RESULT CALLED TO, READ BACK BY AND VERIFIED WITH: SANDERS,[id number]  BY MATTHEWS, B 7.7.2021 Performed at [facility], [address], [place], [place] [address]     Radiology Reports DG Chest 2 View  Result  Date: 02/04/2020 CLINICAL DATA:  Suspected sepsis EXAM: CHEST - 2 VIEW COMPARISON:  02/03/2020 FINDINGS: No new consolidation or edema. No pleural effusion or pneumothorax. Stable cardiomediastinal contours with normal heart size. No acute osseous abnormality. IMPRESSION: No acute process in the chest. Electronically Signed   By: Macy Mis M.D.   On: 02/04/2020 13:53   DG Chest 2 View  Result Date: 02/03/2020 CLINICAL DATA:  Fever EXAM: CHEST - 2 VIEW COMPARISON:  January 23, 2020 FINDINGS: The heart size and mediastinal contours are stable. Both lungs are clear. The visualized skeletal  structures are unremarkable. IMPRESSION: No active cardiopulmonary disease. Electronically Signed   By: Abelardo Diesel M.D.   On: 02/03/2020 10:47   CT ABDOMEN PELVIS W CONTRAST  Result Date: 02/04/2020 CLINICAL DATA:  Liver mass, abdominal pain EXAM: CT ABDOMEN AND PELVIS WITH CONTRAST TECHNIQUE: Multidetector CT imaging of the abdomen and pelvis was performed using the standard protocol following bolus administration of intravenous contrast. CONTRAST:  153mL OMNIPAQUE IOHEXOL 300 MG/ML  SOLN COMPARISON:  MR abdomen pelvis, 01/21/2020, noncontrast CT abdomen pelvis, 01/21/2020 FINDINGS: Lower chest: No acute abnormality. Three-vessel coronary artery calcifications. Hepatobiliary: Redemonstrated peripherally enhancing hypodense mass of the inferior tip of the right lobe of the liver, hepatic segment VI, measuring approximately 2.5 cm on this examination, better characterized by prior multiphasic contrast enhanced MRI. Gallstones in the dependent gallbladder. No gallbladder wall thickening, or biliary dilatation. Pancreas: Redemonstrated low-attenuation lesion of the pancreatic head adjacent to the common bile duct measuring 1.5 cm, again better characterized by prior multiphasic MRI. No pancreatic ductal dilatation or surrounding inflammatory changes. Spleen: Normal in size without significant abnormality. Adrenals/Urinary Tract: Adrenal glands are unremarkable. Kidneys are normal, without renal calculi, solid lesion, or hydronephrosis. Bladder is unremarkable. Stomach/Bowel: Stomach is within normal limits. Status post appendectomy. Occasional sigmoid diverticula, with wall thickening and fat stranding about a prominent diverticulum of the mid sigmoid (series 2, image 92). Vascular/Lymphatic: Aortic atherosclerosis. No enlarged abdominal or pelvic lymph nodes. Reproductive: Status post prostatectomy. Other: No abdominal wall hernia or abnormality. No abdominopelvic ascites. Musculoskeletal: No acute or  significant osseous findings. IMPRESSION: 1. Occasional sigmoid diverticula, with wall thickening and fat stranding about a prominent diverticulum of the mid sigmoid, consistent with acute diverticulitis. No evidence of perforation or abscess. 2. Redemonstrated peripherally enhancing hypodense mass of the inferior tip of the right lobe of the liver, hepatic segment VI, measuring approximately 2.5 cm on this examination, better characterized by prior multiphasic contrast enhanced MRI. 3. Redemonstrated low-attenuation lesion of the pancreatic head adjacent to the common bile duct measuring 1.5 cm, again better characterized by prior multiphasic MRI. 4. Status post prostatectomy. 5. Coronary artery disease.  Aortic Atherosclerosis (ICD10-I70.0). Electronically Signed   By: Eddie Candle M.D.   On: 02/04/2020 16:40   MR 3D Recon At Scanner  Result Date: 01/21/2020 CLINICAL DATA:  Evaluate liver and pancreas lesions EXAM: MRI ABDOMEN WITHOUT AND WITH CONTRAST (INCLUDING MRCP) TECHNIQUE: Multiplanar multisequence MR imaging of the abdomen was performed both before and after the administration of intravenous contrast. Heavily T2-weighted images of the biliary and pancreatic ducts were obtained, and three-dimensional MRCP images were rendered by post processing. CONTRAST:  49mL GADAVIST GADOBUTROL 1 MMOL/ML IV SOLN COMPARISON:  Same day CT abdomen pelvis, 01/21/2020, 04/08/2016, 10/29/2007 FINDINGS: Examination is generally limited by extensive breath motion artifact throughout. Lower chest: Trace pleural effusions. Hepatobiliary: There is a subcapsular mass in the inferior tip of the right lobe of the liver, hepatic segment VI,  measuring 3.3 x 2.5 cm, with slight heterogeneous internal T2 hyperintensity, diffusion restriction, and demonstrating very gradual progressive peripheral nodular enhancement (series 19, image 49). An additional lesion in the posterior right lobe of the liver, hepatic segment VI, measures 0.8 cm  and is too small to characterize although likely a subcentimeter cyst or hemangioma. Multiple small gallstones in the dependent gallbladder. Pancreas: There is a lobulated fluid signal, nonenhancing lesion of the pancreatic head immediately adjacent to the central common bile duct measuring 1.5 x 1.4 cm (series 22, image 25, series 4, image 22). No pancreatic ductal dilatation. Spleen:  Within normal limits in size and appearance. Adrenals/Urinary Tract: No masses identified. No evidence of hydronephrosis. Stomach/Bowel: Visualized portions within the abdomen are unremarkable. Vascular/Lymphatic: No pathologically enlarged lymph nodes identified. No abdominal aortic aneurysm demonstrated. Other:  None. Musculoskeletal: No suspicious bone lesions identified. IMPRESSION: 1. Examination is limited by extensive breath motion artifact throughout. 2. There is a subcapsular mass in the inferior tip of the right lobe of the liver, hepatic segment VI, measuring 3.3 x 2.5 cm, with very gradual progressive peripheral nodular enhancement. 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 3. An additional lesion in the posterior right lobe of the liver, hepatic segment VI, measures 0.8 cm and is too small to characterize although statistically likely a subcentimeter cyst or hemangioma. 4. There is a lobulated fluid signal, nonenhancing lesion of the pancreatic head immediately adjacent to the central common bile duct measuring 1.5 cm. 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 No further follow-up is required. This recommendation follows ACR consensus guidelines: Management of Incidental Pancreatic Cysts: A White Paper of the ACR Incidental Findings Committee. [place] [id number];96:[id number]. Electronically Signed   By: Eddie Candle M.D.   On: 01/21/2020 13:57   DG CHEST PORT 1 VIEW  Result Date: 01/23/2020 CLINICAL DATA:  New onset wheezing and shortness of breath EXAM: PORTABLE CHEST 1 VIEW COMPARISON:  10/26/2016 FINDINGS: Post median sternotomy for CABG. Stable heart size. Hilar structures are normal. Lungs are clear.  Question small RIGHT effusion. Visualized skeletal structures on limited assessment are unremarkable. IMPRESSION: Question small RIGHT effusion.  No sign of consolidation. Electronically Signed   By: Zetta Bills M.D.   On: 01/23/2020 12:47   DG Epidural/Nerve Root  Result Date: 01/13/2020 CLINICAL DATA:  Connective tissue a disc stenosis of intervertebral foramina of the lumbar region. Lumbar fusion L3-4 and L4-5. Transitional anatomy. Adjacent level disease with a broad-based disc protrusion at L2-3 FLUOROSCOPY TIME:  Radiation Exposure Index (as provided by the fluoroscopic device): 43.11 uGy*m2 PROCEDURE: SELECTIVE NERVE ROOT AND TRANSFORAMINAL EPIDURAL STEROID INJECTION: A transforaminal approach was performed on the right at the L2-3 foramen. The overlying skin was cleansed and anesthetized. A 22 gauge spinal needle was advanced into the foramen from a lateral oblique approach. Injection of 2cc of Isovue-M 200 outlined the nerve root and extended into the epidural space. No vascular or intrathecal spread is evident. I then injected 60 mg of Depo-Medrol and 0.75 ml of 1% lidocaine. The patient tolerated the procedure without evidence for complication. A transforaminal approach was performed on the left at the L2-3 foramen. The overlying skin was cleansed and anesthetized. A 22 gauge spinal needle was advanced into the foramen from a lateral oblique approach. Injection of 2cc of Isovue-M  200 outlined the nerve root and extended into the epidural space. No vascular or intrathecal spread is evident. I then injected 60 mg of Depo-Medrol and 0.75 ml of 1% lidocaine. The patient tolerated the procedure without  evidence for complication. The patient was observed for 20 minutes prior to discharge in stable neurologic condition. IMPRESSION: Technically successful selective nerve root block and transforaminal epidural steroid injection bilaterally at the L2-3 foramina. Electronically Signed   By: San Morelle M.D.   On: 01/13/2020 15:29   MR ABDOMEN MRCP W WO CONTAST  Result Date: 01/21/2020 CLINICAL DATA:  Evaluate liver and pancreas lesions EXAM: MRI ABDOMEN WITHOUT AND WITH CONTRAST (INCLUDING MRCP) TECHNIQUE: Multiplanar multisequence MR imaging of the abdomen was performed both before and after the administration of intravenous contrast. Heavily T2-weighted images of the biliary and pancreatic ducts were obtained, and three-dimensional MRCP images were rendered by post processing. CONTRAST:  12mL GADAVIST GADOBUTROL 1 MMOL/ML IV SOLN COMPARISON:  Same day CT abdomen pelvis, 01/21/2020, 04/08/2016, 10/29/2007 FINDINGS: Examination is generally limited by extensive breath motion artifact throughout. Lower chest: Trace pleural effusions. Hepatobiliary: There is a subcapsular mass in the inferior tip of the right lobe of the liver, hepatic segment VI, measuring 3.3 x 2.5 cm, with slight heterogeneous internal T2 hyperintensity, diffusion restriction, and demonstrating very gradual progressive peripheral nodular enhancement (series 19, image 49). An additional lesion in the posterior right lobe of the liver, hepatic segment VI, measures 0.8 cm and is too small to characterize although likely a subcentimeter cyst or hemangioma. Multiple small gallstones in the dependent gallbladder. Pancreas: There is a lobulated fluid signal, nonenhancing lesion of the pancreatic head immediately adjacent to the central common bile duct measuring 1.5 x 1.4 cm (series 22, image 25, series 4, image 22). No pancreatic ductal dilatation. Spleen:  Within normal limits in size and appearance. Adrenals/Urinary Tract: No masses  identified. No evidence of hydronephrosis. Stomach/Bowel: Visualized portions within the abdomen are unremarkable. Vascular/Lymphatic: No pathologically enlarged lymph nodes identified. No abdominal aortic aneurysm demonstrated. Other:  None. Musculoskeletal: No suspicious bone lesions identified. IMPRESSION: 1. Examination is limited by extensive breath motion artifact throughout. 2. There is a subcapsular mass in the inferior tip of the right lobe of the liver, hepatic segment VI, measuring 3.3 x 2.5 cm, with very gradual progressive peripheral nodular enhancement. 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 3. An additional lesion in the posterior right lobe of the liver, hepatic segment VI, measures 0.8 cm and is too small to characterize although statistically likely a subcentimeter cyst or hemangioma. 4. There is a lobulated fluid signal, nonenhancing lesion of the pancreatic head immediately adjacent to the central common bile duct measuring 1.5 cm. 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 No further follow-up is required. This recommendation follows ACR consensus guidelines: Management of Incidental Pancreatic Cysts: A White Paper of the ACR Incidental Findings Committee. [place] [id number]. Electronically Signed   By: Eddie Candle M.D.   On: 01/21/2020 13:57   ECHOCARDIOGRAM COMPLETE  Result Date: 01/23/2020    ECHOCARDIOGRAM REPORT   Patient Name:   YUSUKE BEZA Date of Exam: 01/23/2020 Medical Rec #:  [id number]    Height:       66.0 in Accession #:    [id number]   Weight:  162.0 lb Date of Birth:  September 25, 1940    BSA:          1.829 m Patient Age:    89 years     BP:           128/92 mmHg Patient Gender: M            HR:           97 bpm. Exam Location:  [facility] Procedure: 2D  Echo Indications:    Bacteremia 790.7 / R78.81  History:        Patient has prior history of Echocardiogram examinations, most                 recent 04/12/2011. Arrythmias:Atrial Fibrillation and Atrial                 Flutter; Risk Factors:Hypertension, Dyslipidemia and Non-Smoker.                 Prostate cancer.  Sonographer:    Leavy Cella RDCS (AE) Referring Phys: [place]  1. Left ventricular ejection fraction, by estimation, is 60 to 65%. The left ventricle has normal function. The left ventricle has no regional wall motion abnormalities. There is mild left ventricular hypertrophy. Left ventricular diastolic parameters are consistent with Grade I diastolic dysfunction (impaired relaxation).  2. Right ventricular systolic function is normal. The right ventricular size is normal. There is mildly elevated pulmonary artery systolic pressure.  3. The mitral valve is normal in structure. No evidence of mitral valve regurgitation. No evidence of mitral stenosis.  4. The aortic valve is tricuspid. Aortic valve regurgitation is not visualized. No aortic stenosis is present.  5. The inferior vena cava is normal in size with greater than 50% respiratory variability, suggesting right atrial pressure of 3 mmHg. FINDINGS  Left Ventricle: Left ventricular ejection fraction, by estimation, is 60 to 65%. The left ventricle has normal function. The left ventricle has no regional wall motion abnormalities. The left ventricular internal cavity size was normal in size. There is  mild left ventricular hypertrophy. Left ventricular diastolic parameters are consistent with Grade I diastolic dysfunction (impaired relaxation). Normal left ventricular filling pressure. Right Ventricle: The right ventricular size is normal. No increase in right ventricular wall thickness. Right ventricular systolic function is normal. There is mildly elevated pulmonary artery systolic pressure. The tricuspid regurgitant  velocity is 2.81  m/s, and with an assumed right atrial pressure of 10 mmHg, the estimated right ventricular systolic pressure is 84.1 mmHg. Left Atrium: Left atrial size was normal in size. Right Atrium: Right atrial size was normal in size. Pericardium: There is no evidence of pericardial effusion. Mitral Valve: The mitral valve is normal in structure. No evidence of mitral valve regurgitation. No evidence of mitral valve stenosis. Tricuspid Valve: The tricuspid valve is normal in structure. Tricuspid valve regurgitation is mild . No evidence of tricuspid stenosis. Aortic Valve: The aortic valve is tricuspid. Aortic valve regurgitation is not visualized. No aortic stenosis is present. Aortic valve mean gradient measures 3.1 mmHg. Aortic valve peak gradient measures 6.2 mmHg. Aortic valve area, by VTI measures 2.29 cm. Pulmonic Valve: The pulmonic valve was not well visualized. Pulmonic valve regurgitation is not visualized. No evidence of pulmonic stenosis. Aorta: The aortic root is normal in size and structure. Pulmonary Artery: Mild pulmonary HTN, PASP is 35 mmHg. Venous: The inferior vena cava is normal in size with greater than 50% respiratory variability, suggesting right atrial pressure  of 3 mmHg. IAS/Shunts: No atrial level shunt detected by color flow Doppler.  LEFT VENTRICLE PLAX 2D LVIDd:         3.76 cm  Diastology LVIDs:         2.57 cm  LV e' lateral:   10.10 cm/s LV PW:         1.10 cm  LV E/e' lateral: 6.5 LV IVS:        1.12 cm  LV e' medial:    7.51 cm/s LVOT diam:     1.80 cm  LV E/e' medial:  8.7 LV SV:         45 LV SV Index:   25 LVOT Area:     2.54 cm  RIGHT VENTRICLE RV S prime:     14.60 cm/s TAPSE (M-mode): 1.5 cm LEFT ATRIUM           Index       RIGHT ATRIUM          Index LA diam:      3.20 cm 1.75 cm/m  RA Area:     9.39 cm LA Vol (A2C): 28.4 ml 15.53 ml/m RA Volume:   20.50 ml 11.21 ml/m LA Vol (A4C): 34.5 ml 18.87 ml/m  AORTIC VALVE AV Area (Vmax):    2.10 cm AV Area (Vmean):    2.08 cm AV Area (VTI):     2.29 cm AV Vmax:           124.23 cm/s AV Vmean:          83.099 cm/s AV VTI:            0.196 m AV Peak Grad:      6.2 mmHg AV Mean Grad:      3.1 mmHg LVOT Vmax:         102.35 cm/s LVOT Vmean:        68.044 cm/s LVOT VTI:          0.176 m LVOT/AV VTI ratio: 0.90  AORTA Ao Root diam: 3.30 cm MITRAL VALVE               TRICUSPID VALVE MV Area (PHT): 3.68 cm    TR Peak grad:   31.6 mmHg MV Decel Time: 206 msec    TR Vmax:        281.00 cm/s MV E velocity: 65.50 cm/s MV A velocity: 77.10 cm/s  SHUNTS MV E/A ratio:  0.85        Systemic VTI:  0.18 m                            Systemic Diam: 1.80 cm Carlyle Dolly MD Electronically signed by Carlyle Dolly MD Signature Date/Time: 01/23/2020/11:50:45 AM    Final    CT RENAL STONE STUDY  Result Date: 01/21/2020 CLINICAL DATA:  Back pain, dysuria EXAM: CT ABDOMEN AND PELVIS WITHOUT CONTRAST TECHNIQUE: Multidetector CT imaging of the abdomen and pelvis was performed following the standard protocol without IV contrast. COMPARISON:  03/08/2016 FINDINGS: Lower chest: 5 mm noncalcified posterior right lower lobe pulmonary nodule (series 4, image 40), new from prior. Mild right greater than left bibasilar subsegmental atelectasis. Coronary artery calcifications. Hepatobiliary: Slightly ill-defined 3.0 cm lesion within the inferior aspect of the right hepatic lobe (series 2, image 38), new from prior. There is at least 1 additional subcentimeter indeterminate lesions within the liver (series 2, image 28), not definitively seen on prior. Cholelithiasis.  Gallbladder is mildly distended. No biliary dilatation. Pancreas: Subtle area of relative low attenuation within the posterior aspect of the pancreatic head which appears new from prior (series 2, images 28-29). No peripancreatic stranding. No pancreatic ductal dilatation. No peripancreatic fluid. Spleen: Normal in size without focal abnormality. Adrenals/Urinary Tract: Adrenal glands are  unremarkable. Kidneys are normal, without renal calculi, focal lesion, or hydronephrosis. Bladder is decompressed, limiting its evaluation. Stomach/Bowel: Stomach is within normal limits. Appendix not visualized, and may be surgically absent. Scattered colonic diverticulosis. No evidence of bowel wall thickening, distention, or inflammatory changes. Vascular/Lymphatic: Aortoiliac atherosclerotic calcification without aneurysm. Mild ectasia of the right common iliac artery, unchanged. No enlarged abdominal or pelvic lymph nodes. Reproductive: Prostatectomy. Other: No abdominal wall hernia or abnormality. No abdominopelvic ascites. Musculoskeletal: L3-5 PLIF. No acute osseous findings. The sclerotic osseous lesions seen on prior CT 03/08/2016 have resolved. IMPRESSION: 1. Subtle area of relative low attenuation within the posterior aspect of the pancreatic head which appears new from prior. Pancreatic mass is not excluded. When the patient is clinically stable and able to follow directions and hold their breath (preferably as an outpatient) further evaluation with dedicated contrast-enhanced pancreatic protocol MRI of the abdomen should be considered. 2. New slightly ill-defined 3.0 cm lesion within the inferior aspect of the right hepatic lobe with at least 1 additional subcentimeter indeterminate lesions within the liver. Findings are nonspecific but could reflect hepatic metastatic disease. These findings can also be further evaluated on the previously recommended MRI. 3. 5 mm noncalcified right lower lobe pulmonary nodule, new from prior. 4. The sclerotic osseous lesions seen on prior CT 03/08/2016 have resolved. 5. Cholelithiasis without evidence of acute cholecystitis. 6. Colonic diverticulosis without evidence of acute diverticulitis. 7. Aortic atherosclerosis. (ICD10-I70.0). Electronically Signed   By: Davina Poke D.O.   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 Lumbar fusion L3-4 and L4-5. Transitional anatomy. Adjacent level disease with a broad-based disc protrusion at L2-3 FLUOROSCOPY TIME:  Radiation Exposure Index (as provided by the fluoroscopic device): 43.11 uGy*m2 PROCEDURE: SELECTIVE NERVE ROOT AND TRANSFORAMINAL EPIDURAL STEROID INJECTION: A transforaminal approach was performed on the right at the L2-3 foramen. The overlying skin was cleansed and anesthetized. A 22 gauge spinal needle was advanced into the foramen from a lateral oblique approach. Injection of 2cc of Isovue-M 200 outlined the nerve root and extended into the epidural space. No vascular or intrathecal spread is evident. I then injected 60 mg of Depo-Medrol and 0.75 ml of 1% lidocaine. The patient tolerated the procedure without evidence for complication. A transforaminal approach was performed on the left at the L2-3 foramen. The overlying skin was cleansed and anesthetized. A 22 gauge spinal needle was advanced into the foramen from a lateral oblique approach. Injection of 2cc of Isovue-M 200 outlined the nerve root and extended into the epidural space. No vascular or intrathecal spread is evident. I then injected 60 mg of Depo-Medrol and 0.75 ml of 1% lidocaine. The patient tolerated the procedure without evidence for complication. The patient was observed for 20 minutes prior to discharge in stable neurologic condition. IMPRESSION: Technically successful selective nerve root block and transforaminal epidural steroid injection bilaterally at the L2-3 foramina. Electronically Signed   By: San Morelle M.D.   On: 01/13/2020 15:29     CBC Recent Labs  Lab 02/03/20 1003 02/04/20 1331  02/05/20 0413  WBC 9.7 8.8 6.3  HGB 11.7* 11.5* 9.7*  HCT 37.4* 36.0* 30.3*  PLT 375 357 305  MCV 98.4 97.8 97.1  MCH 30.8 31.3 31.1  MCHC 31.3 31.9 32.0  RDW 14.2 14.2 14.4   LYMPHSABS 1.7 1.8  --   MONOABS 0.9 0.8  --   EOSABS 0.0 0.0  --   BASOSABS 0.1 0.1  --     Chemistries  Recent Labs  Lab 02/03/20 1003 02/04/20 1331 02/05/20 0413  NA 137 131* 133*  K 3.8 3.6 3.0*  CL 100 97* 103  CO2 26 24 23   GLUCOSE 97 98 155*  BUN 24* 21 18  CREATININE 1.32* 1.43* 1.29*  CALCIUM 9.6 9.0 8.0*  AST 27 26 18   ALT 38 33 24  ALKPHOS 77 78 60  BILITOT 0.8 1.1 0.7   ------------------------------------------------------------------------------------------------------------------ No results for input(s): CHOL, HDL, LDLCALC, TRIG, CHOLHDL, LDLDIRECT in the last 72 hours.  No results found for: HGBA1C ------------------------------------------------------------------------------------------------------------------ No results for input(s): TSH, T4TOTAL, T3FREE, THYROIDAB in the last 72 hours.  Invalid input(s): FREET3 ------------------------------------------------------------------------------------------------------------------ No results for input(s): VITAMINB12, FOLATE, FERRITIN, TIBC, IRON, RETICCTPCT in the last 72 hours.  Coagulation profile Recent Labs  Lab 02/04/20 1331  INR 1.2    No results for input(s): DDIMER in the last 72 hours.  Cardiac Enzymes No results for input(s): CKMB, TROPONINI, MYOGLOBIN in the last 168 hours.  Invalid input(s): CK ------------------------------------------------------------------------------------------------------------------ No results found for: BNP   Roxan Hockey M.D on 02/05/2020 at 7:44 PM  Go to [email or web address] - for contact info  [facility] - Office  [phone]

## 2020-02-05 NOTE — Progress Notes (Signed)
Subjective:  Patient feels better.  His appetite is improved.  He has had 3 loose stools today.  There small volume.  No melena or rectal bleeding.  He says he has lower abdominal soreness.  It is no worse than it was yesterday.   Objective: Blood pressure 131/75, pulse 66, temperature 98.2 F (36.8 C), temperature source Oral, resp. rate 20, height 5' 6"  (1.676 m), weight 70.7 kg, SpO2 99 %. Patient is alert and in no acute distress. He is sitting in a recliner. He has round facies. Abdomen is full with ecchymosis across lower abdomen.  Bowel sounds are hyperactive.  He has mild tenderness in hypogastric region on deep palpation.  No guarding or rebound.   Labs/studies Results:  CBC Latest Ref Rng & Units 02/05/2020 02/04/2020 02/03/2020  WBC 4.0 - 10.5 K/uL 6.3 8.8 9.7  Hemoglobin 13.0 - 17.0 g/dL 9.7(L) 11.5(L) 11.7(L)  Hematocrit 39 - 52 % 30.3(L) 36.0(L) 37.4(L)  Platelets 150 - 400 K/uL 305 357 375    CMP Latest Ref Rng & Units 02/05/2020 02/04/2020 02/03/2020  Glucose 70 - 99 mg/dL 155(H) 98 97  BUN 8 - 23 mg/dL 18 21 24(H)  Creatinine 0.61 - 1.24 mg/dL 1.29(H) 1.43(H) 1.32(H)  Sodium 135 - 145 mmol/L 133(L) 131(L) 137  Potassium 3.5 - 5.1 mmol/L 3.0(L) 3.6 3.8  Chloride 98 - 111 mmol/L 103 97(L) 100  CO2 22 - 32 mmol/L 23 24 26   Calcium 8.9 - 10.3 mg/dL 8.0(L) 9.0 9.6  Total Protein 6.5 - 8.1 g/dL 4.8(L) 6.3(L) 6.4(L)  Total Bilirubin 0.3 - 1.2 mg/dL 0.7 1.1 0.8  Alkaline Phos 38 - 126 U/L 60 78 77  AST 15 - 41 U/L 18 26 27   ALT 0 - 44 U/L 24 33 38    Hepatic Function Latest Ref Rng & Units 02/05/2020 02/04/2020 02/03/2020  Total Protein 6.5 - 8.1 g/dL 4.8(L) 6.3(L) 6.4(L)  Albumin 3.5 - 5.0 g/dL 2.2(L) 3.0(L) 3.1(L)  AST 15 - 41 U/L 18 26 27   ALT 0 - 44 U/L 24 33 38  Alk Phosphatase 38 - 126 U/L 60 78 77  Total Bilirubin 0.3 - 1.2 mg/dL 0.7 1.1 0.8    Stool C. difficile antigen and toxin positive.  Urine cultures from from 02/03/2020 and 02/04/2020 remain negative. Blood cultures  from 02/03/2020 and 02/04/2020 remain negative.  CT reviewed again with Dr. Thornton Papas.  There appears to be wall thickening to transverse colon as well.  Assessment:  #1.  Acute illness appears to be secondary to C. difficile colitis and not due to diverticulitis.  Therefore he does not need IV antibiotics.  He seems to be responding to p.o. vancomycin which was started 24 hours ago.  #2.  Liver lesion involving hepatic segment 6.  Patient was scheduled for CT-guided liver biopsy on 02/07/2020.  Liver biopsy canceled given acute illness.  Will plan liver biopsy in 3 weeks or so.   #3.  Anemia.  Anemia secondary to acute illness.  While he does not have gross GI bleeding he could be losing blood due to C. difficile colitis.  #4.  Hypokalemia being addressed by Dr. Denton Brick.  Recommendations   Diet advanced to full liquids. Discontinue IV antibiotics such as Cipro and metronidazole. Continue oral vancomycin at current dose which is 125 mg 4 times a day.  He will need 2-week course. Florastor 1 capsule p.o. twice daily.  Dr. Gala Romney and associates will be providing GI coverage while I will be out of town  until 02/11/2020.

## 2020-02-06 DIAGNOSIS — A0472 Enterocolitis due to Clostridium difficile, not specified as recurrent: Secondary | ICD-10-CM | POA: Diagnosis present

## 2020-02-06 LAB — CBC
HCT: 30.3 % — ABNORMAL LOW (ref 39.0–52.0)
Hemoglobin: 9.6 g/dL — ABNORMAL LOW (ref 13.0–17.0)
MCH: 30.9 pg (ref 26.0–34.0)
MCHC: 31.7 g/dL (ref 30.0–36.0)
MCV: 97.4 fL (ref 80.0–100.0)
Platelets: 303 10*3/uL (ref 150–400)
RBC: 3.11 MIL/uL — ABNORMAL LOW (ref 4.22–5.81)
RDW: 14.3 % (ref 11.5–15.5)
WBC: 6.2 10*3/uL (ref 4.0–10.5)
nRBC: 0 % (ref 0.0–0.2)

## 2020-02-06 LAB — URINE CULTURE: Culture: NO GROWTH

## 2020-02-06 MED ORDER — SACCHAROMYCES BOULARDII 250 MG PO CAPS: 250.0000 mg | ORAL_CAPSULE | Freq: Two times a day (BID) | ORAL | 0 refills | Status: DC

## 2020-02-06 MED ORDER — VANCOMYCIN HCL 125 MG PO CAPS: 125.0000 mg | ORAL_CAPSULE | Freq: Four times a day (QID) | ORAL | 0 refills | Status: DC

## 2020-02-06 MED ORDER — METOPROLOL TARTRATE 75 MG PO TABS: 75.0000 mg | ORAL_TABLET | Freq: Two times a day (BID) | ORAL | 3 refills | Status: DC

## 2020-02-06 MED ORDER — ACETAMINOPHEN 325 MG PO TABS: 650.0000 mg | ORAL_TABLET | Freq: Four times a day (QID) | ORAL | 1 refills | Status: AC | PRN

## 2020-02-06 MED ORDER — POTASSIUM CHLORIDE CRYS ER 20 MEQ PO TBCR: 40.0000 meq | EXTENDED_RELEASE_TABLET | Freq: Once | ORAL | Status: DC

## 2020-02-06 NOTE — Discharge Instructions (Signed)
1)Please follow-up with gastroenterologist Dr. Laural Golden to reschedule your Liver biopsy  2) you are taking apixaban/Eliquis which is a blood thinner so please Avoid ibuprofen/Advil/Aleve/Motrin/Goody Powders/Naproxen/BC powders/Meloxicam/Diclofenac/Indomethacin and other Nonsteroidal anti-inflammatory medications as these will make you more likely to bleed and can cause stomach ulcers, can also cause Kidney problems.   3) please call or return if persistent diarrhea, nausea or vomiting or fevers or persistent abdominal pain

## 2020-02-06 NOTE — Discharge Summary (Signed)
Samuel Gregory, is a 79 y.o. male  DOB 07/14/1941  MRN [id number].  Admission date:  02/04/2020  Admitting Physician  Bethena Roys, MD  Discharge Date:  02/06/2020   Primary MD  Celene Squibb, MD  Recommendations for primary care physician for things to follow:     1)Please follow-up with gastroenterologist Dr. Laural Golden to reschedule your Liver biopsy  2) you are taking apixaban/Eliquis which is a blood thinner so please Avoid ibuprofen/Advil/Aleve/Motrin/Goody Powders/Naproxen/BC powders/Meloxicam/Diclofenac/Indomethacin and other Nonsteroidal anti-inflammatory medications as these will make you more likely to bleed and can cause stomach ulcers, can also cause Kidney problems.   3) please call or return if persistent diarrhea, nausea or vomiting or fevers or persistent abdominal pain  Admission Diagnosis  Diverticulitis [K57.92] Acute diverticulitis [K57.92]   Discharge Diagnosis  Diverticulitis [K57.92] Acute diverticulitis [K57.92]    Principal Problem:   Enteritis due to Clostridium difficile Active Problems:   Prostate cancer --with bony metastases, status post prior prostatectomy in 2004   Essential hypertension   Coronary atherosclerosis of native coronary artery   Bone metastases (HCC)   Acute diverticulitis      Past Medical History:  Diagnosis Date  . Arthritis   . Collagen vascular disease ([place])   . Coronary atherosclerosis of native coronary artery    Multivessel status post CABG 2000  . Essential hypertension   . Hyperlipidemia   . Prostate cancer [facility])     Past Surgical History:  Procedure Laterality Date  . APPENDECTOMY     8 yrs ago- Dr Geroge Baseman  . CATARACT EXTRACTION W/PHACO Left 07/14/2014   Procedure: CATARACT EXTRACTION PHACO AND INTRAOCULAR LENS PLACEMENT (IOC);  Surgeon: Tonny Branch, MD;  Location: [facility] ORS;  Service: Ophthalmology;  Laterality: Left;  CDE:6.20  .  CATARACT EXTRACTION W/PHACO Right 08/11/2014   Procedure: CATARACT EXTRACTION PHACO AND INTRAOCULAR LENS PLACEMENT RIGHT EYE;  Surgeon: Tonny Branch, MD;  Location: [facility] ORS;  Service: Ophthalmology;  Laterality: Right;  CDE:10.11  . COLONOSCOPY N/A 01/14/2015   Procedure: COLONOSCOPY;  Surgeon: Rogene Houston, MD;  Location: [facility] ENDO SUITE;  Service: Endoscopy;  Laterality: N/A;  730  . CORONARY ARTERY BYPASS GRAFT  01/1999   LIMA to LAD, SVG to diagonal, sequential SVG to PDA and PLB  . PROSTATECTOMY  2004   [name]  . TONSILLECTOMY     age 47       HPI  from the history and physical done on the day of admission:   Chief Complaint: fevers  HPI: Samuel Gregory is a 79 y.o. male with medical history significant for prostate cancer with bone metastasis, coronary artery disease, hypertension, atrial flutter. Patient presented to the ED with complaints of fever and chills since yesterday.  He also reports some pain with urination.  He reports 4 episodes of loose stools this morning.  No vomiting.  No abdominal pain.  No difficulty breathing, reports a mild occasional cough.  Mild headache earlier today has resolved, no neck pain or stiffness.  He has no pain or symptoms related to history throat at this time.  Recent hospitalization 6/22 through 6/28-for severe sepsis secondary to strep bacteremia.  Initially treated with broad-spectrum antibiotics then narrowed to IV ceftriaxone, sent home on cefdinir.  Patient completed a 10-day course of antibiotics.suspect source was cholangitis.  Elevated liver enzymes down trended.  CT renal protocol and MRI abdomen suggested possible peripheral cholangiocarcinoma.  Outpatient liver biopsy was planned.  Patient called GI office today, and it was recommended that patient come back to the ER today for evaluation, and a CT of his abdomen.  ED Course: Temperature 101.2, blood pressure 106- 139.  Lactic acid 1.6.  WBC 8.8.  Abdominal CT with contrast  showed findings consistent with acute diverticulitis, MRI recommended for further evaluation of liver and pancreatic head lesion.  Dr. Laural Golden was consulted in the ED, recommended IV Cipro and metronidazole- which was started, rule out C. Difficile, oral vancomycin, liver biopsy will be postponed.  Review of Systems: As per HPI all other systems reviewed and negative.   Hospital Course:   1) C. difficile colitis--- p.o. Vanco as ordered , concomitant acute diverticulitis deemed to be less likely and after further review of imaging studies  -GI input appreciated ---discontinued Rocephin and Flagyl -eating and drinking well, only 1 BM in the last 12 hours No fever  Or chills  -No significant nausea, no vomiting          2)HTN-stable continue metoprolol  3)Possible gram-positive bacteremia----blood cultures from 02/03/2020 with ?? MICROCOCCUS LUTEUS/LYLAE -- --which is probably a contaminant -Repeat blood cultures from 02/04/2020 without growth  4) history of metastatic prostate cancer with mets to the bone-status post prior prostatectomy in 2004, okay to continue prednisone and Zytiga  5)paroxysmal atrial flutter-stable, continue metoprolol 75 mg twice daily for rate control and Eliquis for anticoagulation  6) multivessel CAD--stable, chest pain-free, asymptomatic, status post prior CABG in 2000, continue current regimen including statin, metoprolol  7) CKD stage III--- creatinine currently 1.29 which is at a better than recent baseline - renally adjust medications, avoid nephrotoxic agents / dehydration  / hypotension  Disposition- home   Code Status : full code  Family Communication:    (patient is alert, awake and coherent)  -Discussed with patient's wife Consults  :  Gi    Discharge Condition: stable  Follow UP   Follow-up Information    Rehman, Mechele Dawley, MD. Schedule an appointment as soon as possible for a visit in 3 week(s).   Specialty: Gastroenterology Contact  information: [address], [address] [place] [address] [phone]                Consults obtained -   Diet and Activity recommendation:  As advised  Discharge Instructions    Discharge Instructions    Call MD for:  difficulty breathing, headache or visual disturbances   Complete by: As directed    Call MD for:  persistant dizziness or light-headedness   Complete by: As directed    Call MD for:  persistant nausea and vomiting   Complete by: As directed    Call MD for:  severe uncontrolled pain   Complete by: As directed    Call MD for:  temperature >100.4   Complete by: As directed    Diet - low sodium heart healthy   Complete by: As directed    Discharge instructions   Complete by: As directed    1)Please follow-up with gastroenterologist Dr. Laural Golden to  reschedule your Liver biopsy 2) you are taking apixaban/Eliquis which is a blood thinner so please Avoid ibuprofen/Advil/Aleve/Motrin/Goody Powders/Naproxen/BC powders/Meloxicam/Diclofenac/Indomethacin and other Nonsteroidal anti-inflammatory medications as these will make you more likely to bleed and can cause stomach ulcers, can also cause Kidney problems.   3) please call or return if persistent diarrhea, nausea or vomiting or fevers or persistent abdominal pain   Increase activity slowly   Complete by: As directed         Discharge Medications     Allergies as of 02/06/2020      Reactions   Latex Rash   Sulfa Antibiotics Rash      Medication List    TAKE these medications   acetaminophen 325 MG tablet Commonly known as: TYLENOL Take 2 tablets (650 mg total) by mouth every 6 (six) hours as needed for mild pain, fever or headache (or Fever >/= 101).   apixaban 5 MG Tabs tablet Commonly known as: ELIQUIS Take 1 tablet (5 mg total) by mouth 2 (two) times daily.   CALCIUM 1200+D3 PO Take 1 tablet by mouth daily.   fluticasone 50 MCG/ACT nasal spray Commonly known as: FLONASE Place 1 spray into  both nostrils daily as needed for allergies or rhinitis.   Metoprolol Tartrate 75 MG Tabs Take 75 mg by mouth 2 (two) times daily. What changed:   medication strength  Another medication with the same name was removed. Continue taking this medication, and follow the directions you see here.   multivitamin with minerals Tabs tablet Take 1 tablet by mouth daily.   nitroGLYCERIN 0.4 MG SL tablet Commonly known as: NITROSTAT Place 1 tablet (0.4 mg total) under the tongue every 5 (five) minutes x 3 doses as needed for chest pain (if no relief after 3rd dose, proceed to the ED for an evaluation or call 911).   pantoprazole 40 MG tablet Commonly known as: PROTONIX Take 40 mg by mouth daily as needed (for acid reflux).   predniSONE 5 MG tablet Commonly known as: DELTASONE Take 5 mg by mouth 2 (two) times daily.   rosuvastatin 40 MG tablet Commonly known as: CRESTOR Take 1 tablet (40 mg total) by mouth daily after supper.   saccharomyces boulardii 250 MG capsule Commonly known as: FLORASTOR Take 1 capsule (250 mg total) by mouth 2 (two) times daily for 10 days.   traMADol 50 MG tablet Commonly known as: ULTRAM Take 50 mg by mouth every 6 (six) hours as needed for moderate pain or severe pain.   vancomycin 125 MG capsule Commonly known as: Vancocin HCl Take 1 capsule (125 mg total) by mouth 4 (four) times daily for 12 days.   VITAMIN B-1 PO Take 1 tablet by mouth every morning.   Zytiga 250 MG tablet Generic drug: abiraterone acetate Take 4 tablets by mouth every morning.       Major procedures and Radiology Reports - PLEASE review detailed and final reports for all details, in brief -   DG Chest 2 View  Result Date: 02/04/2020 CLINICAL DATA:  Suspected sepsis EXAM: CHEST - 2 VIEW COMPARISON:  02/03/2020 FINDINGS: No new consolidation or edema. No pleural effusion or pneumothorax. Stable cardiomediastinal contours with normal heart size. No acute osseous abnormality.  IMPRESSION: No acute process in the chest. Electronically Signed   By: Macy Mis M.D.   On: 02/04/2020 13:53   DG Chest 2 View  Result Date: 02/03/2020 CLINICAL DATA:  Fever EXAM: CHEST - 2 VIEW COMPARISON:  January 23, 2020  FINDINGS: The heart size and mediastinal contours are stable. Both lungs are clear. The visualized skeletal structures are unremarkable. IMPRESSION: No active cardiopulmonary disease. Electronically Signed   By: Abelardo Diesel M.D.   On: 02/03/2020 10:47   CT ABDOMEN PELVIS W CONTRAST  Result Date: 02/04/2020 CLINICAL DATA:  Liver mass, abdominal pain EXAM: CT ABDOMEN AND PELVIS WITH CONTRAST TECHNIQUE: Multidetector CT imaging of the abdomen and pelvis was performed using the standard protocol following bolus administration of intravenous contrast. CONTRAST:  134mL OMNIPAQUE IOHEXOL 300 MG/ML  SOLN COMPARISON:  MR abdomen pelvis, 01/21/2020, noncontrast CT abdomen pelvis, 01/21/2020 FINDINGS: Lower chest: No acute abnormality. Three-vessel coronary artery calcifications. Hepatobiliary: Redemonstrated peripherally enhancing hypodense mass of the inferior tip of the right lobe of the liver, hepatic segment VI, measuring approximately 2.5 cm on this examination, better characterized by prior multiphasic contrast enhanced MRI. Gallstones in the dependent gallbladder. No gallbladder wall thickening, or biliary dilatation. Pancreas: Redemonstrated low-attenuation lesion of the pancreatic head adjacent to the common bile duct measuring 1.5 cm, again better characterized by prior multiphasic MRI. No pancreatic ductal dilatation or surrounding inflammatory changes. Spleen: Normal in size without significant abnormality. Adrenals/Urinary Tract: Adrenal glands are unremarkable. Kidneys are normal, without renal calculi, solid lesion, or hydronephrosis. Bladder is unremarkable. Stomach/Bowel: Stomach is within normal limits. Status post appendectomy. Occasional sigmoid diverticula, with wall  thickening and fat stranding about a prominent diverticulum of the mid sigmoid (series 2, image 92). Vascular/Lymphatic: Aortic atherosclerosis. No enlarged abdominal or pelvic lymph nodes. Reproductive: Status post prostatectomy. Other: No abdominal wall hernia or abnormality. No abdominopelvic ascites. Musculoskeletal: No acute or significant osseous findings. IMPRESSION: 1. Occasional sigmoid diverticula, with wall thickening and fat stranding about a prominent diverticulum of the mid sigmoid, consistent with acute diverticulitis. No evidence of perforation or abscess. 2. Redemonstrated peripherally enhancing hypodense mass of the inferior tip of the right lobe of the liver, hepatic segment VI, measuring approximately 2.5 cm on this examination, better characterized by prior multiphasic contrast enhanced MRI. 3. Redemonstrated low-attenuation lesion of the pancreatic head adjacent to the common bile duct measuring 1.5 cm, again better characterized by prior multiphasic MRI. 4. Status post prostatectomy. 5. Coronary artery disease.  Aortic Atherosclerosis (ICD10-I70.0). Electronically Signed   By: Eddie Candle M.D.   On: 02/04/2020 16:40   MR 3D Recon At Scanner  Result Date: 01/21/2020 CLINICAL DATA:  Evaluate liver and pancreas lesions EXAM: MRI ABDOMEN WITHOUT AND WITH CONTRAST (INCLUDING MRCP) TECHNIQUE: Multiplanar multisequence MR imaging of the abdomen was performed both before and after the administration of intravenous contrast. Heavily T2-weighted images of the biliary and pancreatic ducts were obtained, and three-dimensional MRCP images were rendered by post processing. CONTRAST:  86mL GADAVIST GADOBUTROL 1 MMOL/ML IV SOLN COMPARISON:  Same day CT abdomen pelvis, 01/21/2020, 04/08/2016, 10/29/2007 FINDINGS: Examination is generally limited by extensive breath motion artifact throughout. Lower chest: Trace pleural effusions. Hepatobiliary: There is a subcapsular mass in the inferior tip of the right  lobe of the liver, hepatic segment VI, measuring 3.3 x 2.5 cm, with slight heterogeneous internal T2 hyperintensity, diffusion restriction, and demonstrating very gradual progressive peripheral nodular enhancement (series 19, image 49). An additional lesion in the posterior right lobe of the liver, hepatic segment VI, measures 0.8 cm and is too small to characterize although likely a subcentimeter cyst or hemangioma. Multiple small gallstones in the dependent gallbladder. Pancreas: There is a lobulated fluid signal, nonenhancing lesion of the pancreatic head immediately adjacent to the central common bile  duct measuring 1.5 x 1.4 cm (series 22, image 25, series 4, image 22). No pancreatic ductal dilatation. Spleen:  Within normal limits in size and appearance. Adrenals/Urinary Tract: No masses identified. No evidence of hydronephrosis. Stomach/Bowel: Visualized portions within the abdomen are unremarkable. Vascular/Lymphatic: No pathologically enlarged lymph nodes identified. No abdominal aortic aneurysm demonstrated. Other:  None. Musculoskeletal: No suspicious bone lesions identified. IMPRESSION: 1. Examination is limited by extensive breath motion artifact throughout. 2. There is a subcapsular mass in the inferior tip of the right lobe of the liver, hepatic segment VI, measuring 3.3 x 2.5 cm, with very gradual progressive peripheral nodular enhancement. 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 3. An additional lesion in the posterior right lobe of the liver, hepatic segment VI, measures 0.8 cm and is too small to characterize although statistically likely a subcentimeter cyst or hemangioma. 4. There is a lobulated fluid signal, nonenhancing lesion of the pancreatic head immediately adjacent to the central common bile duct measuring 1.5 cm. This is most likely a side branch intraductal papillary mucinous  neoplasm and is essentially stable when compared to remote prior CT dated 10/29/2007. No further follow-up is required. This recommendation follows ACR consensus guidelines: Management of Incidental Pancreatic Cysts: A White Paper of the ACR Incidental Findings Committee. [place] 5573;22:[id number]. Electronically Signed   By: Eddie Candle M.D.   On: 01/21/2020 13:57   DG CHEST PORT 1 VIEW  Result Date: 01/23/2020 CLINICAL DATA:  New onset wheezing and shortness of breath EXAM: PORTABLE CHEST 1 VIEW COMPARISON:  10/26/2016 FINDINGS: Post median sternotomy for CABG. Stable heart size. Hilar structures are normal. Lungs are clear.  Question small RIGHT effusion. Visualized skeletal structures on limited assessment are unremarkable. IMPRESSION: Question small RIGHT effusion.  No sign of consolidation. Electronically Signed   By: Zetta Bills M.D.   On: 01/23/2020 12:47   DG Epidural/Nerve Root  Result Date: 01/13/2020 CLINICAL DATA:  Connective tissue a disc stenosis of intervertebral foramina of the lumbar region. Lumbar fusion L3-4 and L4-5. Transitional anatomy. Adjacent level disease with a broad-based disc protrusion at L2-3 FLUOROSCOPY TIME:  Radiation Exposure Index (as provided by the fluoroscopic device): 43.11 uGy*m2 PROCEDURE: SELECTIVE NERVE ROOT AND TRANSFORAMINAL EPIDURAL STEROID INJECTION: A transforaminal approach was performed on the right at the L2-3 foramen. The overlying skin was cleansed and anesthetized. A 22 gauge spinal needle was advanced into the foramen from a lateral oblique approach. Injection of 2cc of Isovue-M 200 outlined the nerve root and extended into the epidural space. No vascular or intrathecal spread is evident. I then injected 60 mg of Depo-Medrol and 0.75 ml of 1% lidocaine. The patient tolerated the procedure without evidence for complication. A transforaminal approach was performed on the left at the L2-3 foramen. The overlying skin was cleansed and  anesthetized. A 22 gauge spinal needle was advanced into the foramen from a lateral oblique approach. Injection of 2cc of Isovue-M 200 outlined the nerve root and extended into the epidural space. No vascular or intrathecal spread is evident. I then injected 60 mg of Depo-Medrol and 0.75 ml of 1% lidocaine. The patient tolerated the procedure without evidence for complication. The patient was observed for 20 minutes prior to discharge in stable neurologic condition. IMPRESSION: Technically successful selective nerve root block and transforaminal epidural steroid injection bilaterally at the L2-3 foramina. Electronically Signed   By: Wynetta Fines.D.  On: 01/13/2020 15:29   MR ABDOMEN MRCP W WO CONTAST  Result Date: 01/21/2020 CLINICAL DATA:  Evaluate liver and pancreas lesions EXAM: MRI ABDOMEN WITHOUT AND WITH CONTRAST (INCLUDING MRCP) TECHNIQUE: Multiplanar multisequence MR imaging of the abdomen was performed both before and after the administration of intravenous contrast. Heavily T2-weighted images of the biliary and pancreatic ducts were obtained, and three-dimensional MRCP images were rendered by post processing. CONTRAST:  65mL GADAVIST GADOBUTROL 1 MMOL/ML IV SOLN COMPARISON:  Same day CT abdomen pelvis, 01/21/2020, 04/08/2016, 10/29/2007 FINDINGS: Examination is generally limited by extensive breath motion artifact throughout. Lower chest: Trace pleural effusions. Hepatobiliary: There is a subcapsular mass in the inferior tip of the right lobe of the liver, hepatic segment VI, measuring 3.3 x 2.5 cm, with slight heterogeneous internal T2 hyperintensity, diffusion restriction, and demonstrating very gradual progressive peripheral nodular enhancement (series 19, image 49). An additional lesion in the posterior right lobe of the liver, hepatic segment VI, measures 0.8 cm and is too small to characterize although likely a subcentimeter cyst or hemangioma. Multiple small gallstones in the  dependent gallbladder. Pancreas: There is a lobulated fluid signal, nonenhancing lesion of the pancreatic head immediately adjacent to the central common bile duct measuring 1.5 x 1.4 cm (series 22, image 25, series 4, image 22). No pancreatic ductal dilatation. Spleen:  Within normal limits in size and appearance. Adrenals/Urinary Tract: No masses identified. No evidence of hydronephrosis. Stomach/Bowel: Visualized portions within the abdomen are unremarkable. Vascular/Lymphatic: No pathologically enlarged lymph nodes identified. No abdominal aortic aneurysm demonstrated. Other:  None. Musculoskeletal: No suspicious bone lesions identified. IMPRESSION: 1. Examination is limited by extensive breath motion artifact throughout. 2. There is a subcapsular mass in the inferior tip of the right lobe of the liver, hepatic segment VI, measuring 3.3 x 2.5 cm, with very gradual progressive peripheral nodular enhancement. 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 3. An additional lesion in the posterior right lobe of the liver, hepatic segment VI, measures 0.8 cm and is too small to characterize although statistically likely a subcentimeter cyst or hemangioma. 4. There is a lobulated fluid signal, nonenhancing lesion of the pancreatic head immediately adjacent to the central common bile duct measuring 1.5 cm. 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 No further follow-up is required. This recommendation follows ACR consensus guidelines: Management of Incidental Pancreatic Cysts: A White Paper of the ACR Incidental Findings Committee. [place] 2774;12:[id number]. Electronically Signed   By: Eddie Candle M.D.   On: 01/21/2020 13:57   ECHOCARDIOGRAM COMPLETE  Result Date: 01/23/2020    ECHOCARDIOGRAM REPORT    Patient Name:   Samuel Gregory Date of Exam: 01/23/2020 Medical Rec #:  [id number]    Height:       66.0 in Accession #:    [id number]   Weight:       162.0 lb Date of Birth:  Feb 15, 1941    BSA:          1.829 m Patient Age:    46 years     BP:           128/92 mmHg Patient Gender: M            HR:           97 bpm. Exam Location:  [facility] Procedure: 2D  Echo Indications:    Bacteremia 790.7 / R78.81  History:        Patient has prior history of Echocardiogram examinations, most                 recent 04/12/2011. Arrythmias:Atrial Fibrillation and Atrial                 Flutter; Risk Factors:Hypertension, Dyslipidemia and Non-Smoker.                 Prostate cancer.  Sonographer:    Leavy Cella RDCS (AE) Referring Phys: [place]  1. Left ventricular ejection fraction, by estimation, is 60 to 65%. The left ventricle has normal function. The left ventricle has no regional wall motion abnormalities. There is mild left ventricular hypertrophy. Left ventricular diastolic parameters are consistent with Grade I diastolic dysfunction (impaired relaxation).  2. Right ventricular systolic function is normal. The right ventricular size is normal. There is mildly elevated pulmonary artery systolic pressure.  3. The mitral valve is normal in structure. No evidence of mitral valve regurgitation. No evidence of mitral stenosis.  4. The aortic valve is tricuspid. Aortic valve regurgitation is not visualized. No aortic stenosis is present.  5. The inferior vena cava is normal in size with greater than 50% respiratory variability, suggesting right atrial pressure of 3 mmHg. FINDINGS  Left Ventricle: Left ventricular ejection fraction, by estimation, is 60 to 65%. The left ventricle has normal function. The left ventricle has no regional wall motion abnormalities. The left ventricular internal cavity size was normal in size. There is  mild left ventricular hypertrophy. Left ventricular diastolic parameters  are consistent with Grade I diastolic dysfunction (impaired relaxation). Normal left ventricular filling pressure. Right Ventricle: The right ventricular size is normal. No increase in right ventricular wall thickness. Right ventricular systolic function is normal. There is mildly elevated pulmonary artery systolic pressure. The tricuspid regurgitant velocity is 2.81  m/s, and with an assumed right atrial pressure of 10 mmHg, the estimated right ventricular systolic pressure is 61.9 mmHg. Left Atrium: Left atrial size was normal in size. Right Atrium: Right atrial size was normal in size. Pericardium: There is no evidence of pericardial effusion. Mitral Valve: The mitral valve is normal in structure. No evidence of mitral valve regurgitation. No evidence of mitral valve stenosis. Tricuspid Valve: The tricuspid valve is normal in structure. Tricuspid valve regurgitation is mild . No evidence of tricuspid stenosis. Aortic Valve: The aortic valve is tricuspid. Aortic valve regurgitation is not visualized. No aortic stenosis is present. Aortic valve mean gradient measures 3.1 mmHg. Aortic valve peak gradient measures 6.2 mmHg. Aortic valve area, by VTI measures 2.29 cm. Pulmonic Valve: The pulmonic valve was not well visualized. Pulmonic valve regurgitation is not visualized. No evidence of pulmonic stenosis. Aorta: The aortic root is normal in size and structure. Pulmonary Artery: Mild pulmonary HTN, PASP is 35 mmHg. Venous: The inferior vena cava is normal in size with greater than 50% respiratory variability, suggesting right atrial pressure of 3 mmHg. IAS/Shunts: No atrial level shunt detected by color flow Doppler.  LEFT VENTRICLE PLAX 2D LVIDd:         3.76 cm  Diastology LVIDs:         2.57 cm  LV e' lateral:   10.10 cm/s LV PW:         1.10 cm  LV E/e' lateral: 6.5 LV IVS:        1.12 cm  LV e'  medial:    7.51 cm/s LVOT diam:     1.80 cm  LV E/e' medial:  8.7 LV SV:         45 LV SV Index:   25 LVOT Area:      2.54 cm  RIGHT VENTRICLE RV S prime:     14.60 cm/s TAPSE (M-mode): 1.5 cm LEFT ATRIUM           Index       RIGHT ATRIUM          Index LA diam:      3.20 cm 1.75 cm/m  RA Area:     9.39 cm LA Vol (A2C): 28.4 ml 15.53 ml/m RA Volume:   20.50 ml 11.21 ml/m LA Vol (A4C): 34.5 ml 18.87 ml/m  AORTIC VALVE AV Area (Vmax):    2.10 cm AV Area (Vmean):   2.08 cm AV Area (VTI):     2.29 cm AV Vmax:           124.23 cm/s AV Vmean:          83.099 cm/s AV VTI:            0.196 m AV Peak Grad:      6.2 mmHg AV Mean Grad:      3.1 mmHg LVOT Vmax:         102.35 cm/s LVOT Vmean:        68.044 cm/s LVOT VTI:          0.176 m LVOT/AV VTI ratio: 0.90  AORTA Ao Root diam: 3.30 cm MITRAL VALVE               TRICUSPID VALVE MV Area (PHT): 3.68 cm    TR Peak grad:   31.6 mmHg MV Decel Time: 206 msec    TR Vmax:        281.00 cm/s MV E velocity: 65.50 cm/s MV A velocity: 77.10 cm/s  SHUNTS MV E/A ratio:  0.85        Systemic VTI:  0.18 m                            Systemic Diam: 1.80 cm Carlyle Dolly MD Electronically signed by Carlyle Dolly MD Signature Date/Time: 01/23/2020/11:50:45 AM    Final    CT RENAL STONE STUDY  Result Date: 01/21/2020 CLINICAL DATA:  Back pain, dysuria EXAM: CT ABDOMEN AND PELVIS WITHOUT CONTRAST TECHNIQUE: Multidetector CT imaging of the abdomen and pelvis was performed following the standard protocol without IV contrast. COMPARISON:  03/08/2016 FINDINGS: Lower chest: 5 mm noncalcified posterior right lower lobe pulmonary nodule (series 4, image 40), new from prior. Mild right greater than left bibasilar subsegmental atelectasis. Coronary artery calcifications. Hepatobiliary: Slightly ill-defined 3.0 cm lesion within the inferior aspect of the right hepatic lobe (series 2, image 38), new from prior. There is at least 1 additional subcentimeter indeterminate lesions within the liver (series 2, image 28), not definitively seen on prior. Cholelithiasis. Gallbladder is mildly distended. No  biliary dilatation. Pancreas: Subtle area of relative low attenuation within the posterior aspect of the pancreatic head which appears new from prior (series 2, images 28-29). No peripancreatic stranding. No pancreatic ductal dilatation. No peripancreatic fluid. Spleen: Normal in size without focal abnormality. Adrenals/Urinary Tract: Adrenal glands are unremarkable. Kidneys are normal, without renal calculi, focal lesion, or hydronephrosis. Bladder is decompressed, limiting its evaluation. Stomach/Bowel: Stomach is within normal limits. Appendix not visualized, and  may be surgically absent. Scattered colonic diverticulosis. No evidence of bowel wall thickening, distention, or inflammatory changes. Vascular/Lymphatic: Aortoiliac atherosclerotic calcification without aneurysm. Mild ectasia of the right common iliac artery, unchanged. No enlarged abdominal or pelvic lymph nodes. Reproductive: Prostatectomy. Other: No abdominal wall hernia or abnormality. No abdominopelvic ascites. Musculoskeletal: L3-5 PLIF. No acute osseous findings. The sclerotic osseous lesions seen on prior CT 03/08/2016 have resolved. IMPRESSION: 1. Subtle area of relative low attenuation within the posterior aspect of the pancreatic head which appears new from prior. Pancreatic mass is not excluded. When the patient is clinically stable and able to follow directions and hold their breath (preferably as an outpatient) further evaluation with dedicated contrast-enhanced pancreatic protocol MRI of the abdomen should be considered. 2. New slightly ill-defined 3.0 cm lesion within the inferior aspect of the right hepatic lobe with at least 1 additional subcentimeter indeterminate lesions within the liver. Findings are nonspecific but could reflect hepatic metastatic disease. These findings can also be further evaluated on the previously recommended MRI. 3. 5 mm noncalcified right lower lobe pulmonary nodule, new from prior. 4. The sclerotic osseous  lesions seen on prior CT 03/08/2016 have resolved. 5. Cholelithiasis without evidence of acute cholecystitis. 6. Colonic diverticulosis without evidence of acute diverticulitis. 7. Aortic atherosclerosis. (ICD10-I70.0). Electronically Signed   By: Davina Poke D.O.   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 Lumbar fusion L3-4 and L4-5. Transitional anatomy. Adjacent level disease with a broad-based disc protrusion at L2-3 FLUOROSCOPY TIME:  Radiation Exposure Index (as provided by the fluoroscopic device): 43.11 uGy*m2 PROCEDURE: SELECTIVE NERVE ROOT AND TRANSFORAMINAL EPIDURAL STEROID INJECTION: A transforaminal approach was performed on the right at the L2-3 foramen. The overlying skin was cleansed and anesthetized. A 22 gauge spinal needle was advanced into the foramen from a lateral oblique approach. Injection of 2cc of Isovue-M 200 outlined the nerve root and extended into the epidural space. No vascular or intrathecal spread is evident. I then injected 60 mg of Depo-Medrol and 0.75 ml of 1% lidocaine. The patient tolerated the procedure without evidence for complication. A transforaminal approach was performed on the left at the L2-3 foramen. The overlying skin was cleansed and anesthetized. A 22 gauge spinal needle was advanced into the foramen from a lateral oblique approach. Injection of 2cc of Isovue-M 200 outlined the nerve root and extended into the epidural space. No vascular or intrathecal spread is evident. I then injected 60 mg of Depo-Medrol and 0.75 ml of 1% lidocaine. The patient tolerated the procedure without evidence for complication. The patient was observed for 20 minutes prior to discharge in stable neurologic condition. IMPRESSION: Technically successful selective nerve root block and transforaminal epidural steroid injection  bilaterally at the L2-3 foramina. Electronically Signed   By: San Morelle M.D.   On: 01/13/2020 15:29    Micro Results   Recent Results (from the past 240 hour(s))  Culture, blood (routine x 2)     Status: None (Preliminary result)   Collection Time: 02/03/20 10:03 AM   Specimen: Left Antecubital; Blood  Result Value Ref Range Status   Specimen Description LEFT ANTECUBITAL  Final   Special Requests   Final    BOTTLES DRAWN AEROBIC AND ANAEROBIC Blood Culture adequate volume   Culture   Final    NO GROWTH 3 DAYS Performed at [facility], [address] [place], [place] [address]    Report Status PENDING  Incomplete  Culture, blood (routine x 2)     Status: Abnormal   Collection Time: 02/03/20 10:03 AM   Specimen: Right Antecubital; Blood  Result Value Ref Range Status   Specimen Description   Final    RIGHT ANTECUBITAL Performed at [facility], [address] [place], [place] [address]    Special Requests   Final    BOTTLES DRAWN AEROBIC AND ANAEROBIC Blood Culture adequate volume Performed at [facility], [address], [place], [place] [address]    Culture  Setup Time   Final    GRAM POSITIVE COCCI Gram Stain Report Called to,Read Back By and Verified With: BELTON,C ON 02/04/20 AT 1955 BY LOY,C AEROBIC BOTTLE PERFORMED AT [facility] Organism ID to follow    Culture (A)  Final    MICROCOCCUS LUTEUS/LYLAE Standardized susceptibility testing for this organism is not available. Performed at [facility], [place] [address], [place], [place] [address]    Report Status 02/05/2020 FINAL  Final  Blood Culture ID Panel (Reflexed)     Status: None   Collection Time: 02/03/20 10:03 AM  Result Value Ref Range Status   Enterococcus species NOT DETECTED NOT DETECTED Final   Listeria monocytogenes NOT DETECTED NOT DETECTED Final   Staphylococcus species NOT DETECTED NOT DETECTED Final   Staphylococcus aureus (BCID) NOT DETECTED NOT DETECTED Final   Streptococcus species NOT  DETECTED NOT DETECTED Final   Streptococcus agalactiae NOT DETECTED NOT DETECTED Final   Streptococcus pneumoniae NOT DETECTED NOT DETECTED Final   Streptococcus pyogenes NOT DETECTED NOT DETECTED Final   Acinetobacter baumannii NOT DETECTED NOT DETECTED Final   Enterobacteriaceae species NOT DETECTED NOT DETECTED Final   Enterobacter cloacae complex NOT DETECTED NOT DETECTED Final   Escherichia coli NOT DETECTED NOT DETECTED Final   Klebsiella oxytoca NOT DETECTED NOT DETECTED Final   Klebsiella pneumoniae NOT DETECTED NOT DETECTED Final   Proteus species NOT DETECTED NOT DETECTED Final   Serratia marcescens NOT DETECTED NOT DETECTED Final   Haemophilus influenzae NOT DETECTED NOT DETECTED Final   Neisseria meningitidis NOT DETECTED NOT DETECTED Final   Pseudomonas aeruginosa NOT DETECTED NOT DETECTED Final   Candida albicans NOT DETECTED NOT DETECTED Final   Candida glabrata NOT DETECTED NOT DETECTED Final   Candida krusei NOT DETECTED NOT DETECTED Final   Candida parapsilosis NOT DETECTED NOT DETECTED Final   Candida tropicalis NOT DETECTED NOT DETECTED Final    Comment: Performed at [facility], [place] [address], [place], [place] [address]  Urine culture     Status: None   Collection Time: 02/03/20 12:00 PM   Specimen: Urine, Clean Catch  Result Value Ref Range Status   Specimen Description   Final    URINE, CLEAN CATCH Performed at [facility], [address] [place], [place] [address]    Special Requests   Final    NONE Performed at [facility], [address] [place], [place] [address]    Culture   Final    NO GROWTH Performed at [facility], [place] [address], [place],  [address]    Report Status 02/04/2020 FINAL  Final  Culture, blood (Routine x 2)     Status: None (Preliminary result)   Collection Time: 02/04/20  1:32 PM   Specimen: Right Antecubital; Blood  Result Value Ref Range Status   Specimen Description RIGHT ANTECUBITAL  Final   Special  Requests  Final    BOTTLES DRAWN AEROBIC AND ANAEROBIC Blood Culture adequate volume   Culture   Final    NO GROWTH 2 DAYS Performed at [facility], [address] [place], [place] [address]    Report Status PENDING  Incomplete  Culture, blood (Routine x 2)     Status: None (Preliminary result)   Collection Time: 02/04/20  2:20 PM   Specimen: Left Antecubital; Blood  Result Value Ref Range Status   Specimen Description LEFT ANTECUBITAL  Final   Special Requests   Final    BOTTLES DRAWN AEROBIC AND ANAEROBIC Blood Culture adequate volume   Culture   Final    NO GROWTH 2 DAYS Performed at [facility], [address], [place], [place] [address]    Report Status PENDING  Incomplete  SARS Coronavirus 2 by RT PCR (hospital order, performed in [place] hospital lab) Nasopharyngeal Nasopharyngeal Swab     Status: None   Collection Time: 02/04/20  4:25 PM   Specimen: Nasopharyngeal Swab  Result Value Ref Range Status   SARS Coronavirus 2 NEGATIVE NEGATIVE Final    Comment: (NOTE) SARS-CoV-2 target nucleic acids are NOT DETECTED.  The SARS-CoV-2 RNA is generally detectable in upper and lower respiratory specimens during the acute phase of infection. The lowest concentration of SARS-CoV-2 viral copies this assay can detect is 250 copies / mL. A negative result does not preclude SARS-CoV-2 infection and should not be used as the sole basis for treatment or other patient management decisions.  A negative result may occur with improper specimen collection / handling, submission of specimen other than nasopharyngeal swab, presence of viral mutation(s) within the areas targeted by this assay, and inadequate number of viral copies (<250 copies / mL). A negative result must be combined with clinical observations, patient history, and epidemiological information.  Fact Sheet for Patients:   StrictlyIdeas.no  Fact Sheet for Healthcare  Providers: BankingDealers.co.za  This test is not yet approved or  cleared by the [place] FDA and has been authorized for detection and/or diagnosis of SARS-CoV-2 by FDA under an Emergency Use Authorization (EUA).  This EUA will remain in effect (meaning this test can be used) for the duration of the COVID-19 declaration under Section 564(b)(1) of the Act, 21 U.S.C. section 360bbb-3(b)(1), unless the authorization is terminated or revoked sooner.  Performed at [facility], [address], [place], [place] [address]   Culture, Urine     Status: None   Collection Time: 02/04/20  6:02 PM   Specimen: Urine, Clean Catch  Result Value Ref Range Status   Specimen Description   Final    URINE, CLEAN CATCH Performed at [facility], [address] [place], [place] [address]    Special Requests   Final    NONE Performed at [facility], [address] [place], [place] [address]    Culture   Final    NO GROWTH Performed at [facility], [place] [address] [place], [place] [address]    Report Status 02/06/2020 FINAL  Final  C Difficile Quick Screen w PCR reflex     Status: Abnormal   Collection Time: 02/05/20  6:35 AM   Specimen: STOOL  Result Value Ref Range Status   C Diff antigen POSITIVE (A) NEGATIVE Final   C Diff toxin POSITIVE (A) NEGATIVE Final   C Diff interpretation Toxin producing C. difficile detected.  Final    Comment: CRITICAL RESULT CALLED TO, READ BACK BY AND VERIFIED WITH:  SANDERS,D@[id number]  BY MATTHEWS, B 7.7.2021 Performed at [facility], [address] [place], [place] [address]        Today   Subjective    Samuel Gregory today has no new complaints, eating and drinking well, only 1 BM in the last 12 hours No fever  Or chills  -No significant nausea, no vomiting          Patient has been seen and examined prior to discharge   Objective   Blood pressure 132/76, pulse 73, temperature 100.1 F (37.8 C), temperature source Oral,  resp. rate 20, height 5\' 6"  (1.676 m), weight 70.7 kg, SpO2 97 %.  No intake or output data in the 24 hours ending 02/06/20 1715  Exam Gen:- Awake Alert, no acute distress  HEENT:- [address].AT, No sclera icterus Neck-Supple Neck,No JVD,.  Lungs-  CTAB , good air movement bilaterally  CV- S1, S2 normal, regular, prior CABG scar Abd-  +ve B.Sounds, Abd Soft, No tenderness,    Extremity/Skin:- No  edema,   good pulses Psych-affect is appropriate, oriented x3 Neuro-no new focal deficits, no tremors    Data Review   CBC w Diff:  Lab Results  Component Value Date   WBC 6.2 02/06/2020   HGB 9.6 (L) 02/06/2020   HCT 30.3 (L) 02/06/2020   PLT 303 02/06/2020   LYMPHOPCT 21 02/04/2020   MONOPCT 10 02/04/2020   EOSPCT 1 02/04/2020   BASOPCT 1 02/04/2020    CMP:  Lab Results  Component Value Date   NA 133 (L) 02/05/2020   K 3.0 (L) 02/05/2020   CL 103 02/05/2020   CO2 23 02/05/2020   BUN 18 02/05/2020   CREATININE 1.29 (H) 02/05/2020   CREATININE 1.09 09/08/2014   PROT 4.8 (L) 02/05/2020   ALBUMIN 2.2 (L) 02/05/2020   BILITOT 0.7 02/05/2020   ALKPHOS 60 02/05/2020   AST 18 02/05/2020   ALT 24 02/05/2020  .   Total Discharge time is about 33 minutes  Roxan Hockey M.D on 02/06/2020 at 5:15 PM  Go to [email or web address] -  for contact info  [facility] - Office  [phone]

## 2020-02-06 NOTE — Care Management Important Message (Signed)
Important Message  Patient Details  Name: Samuel Gregory MRN: [id number] Date of Birth: Aug 24, 1940   Medicare Important Message Given:  Yes Larene Beach, RN agreed to delivering letter due to contact precautions)     Tommy Medal 02/06/2020, 4:08 PM

## 2020-02-06 NOTE — Progress Notes (Signed)
Nsg Discharge Note  Admit Date:  02/04/2020 Discharge date: 02/06/2020   Samuel Gregory to be D/C'd Home per MD order.  AVS completed.  Copy for chart, and copy for patient signed, and dated. Removed IV-clean, dry, intact. Reviewed d/c paperwork with patient and wife. Answered all questions. Wheeled stable patient and belongings to main entrance whewre he was picked up by his wife to d/c to home. Patient/caregiver able to verbalize understanding.  Discharge Medication: Allergies as of 02/06/2020      Reactions   Latex Rash   Sulfa Antibiotics Rash      Medication List    TAKE these medications   acetaminophen 325 MG tablet Commonly known as: TYLENOL Take 2 tablets (650 mg total) by mouth every 6 (six) hours as needed for mild pain, fever or headache (or Fever >/= 101).   apixaban 5 MG Tabs tablet Commonly known as: ELIQUIS Take 1 tablet (5 mg total) by mouth 2 (two) times daily.   CALCIUM 1200+D3 PO Take 1 tablet by mouth daily.   fluticasone 50 MCG/ACT nasal spray Commonly known as: FLONASE Place 1 spray into both nostrils daily as needed for allergies or rhinitis.   Metoprolol Tartrate 75 MG Tabs Take 75 mg by mouth 2 (two) times daily. What changed:   medication strength  Another medication with the same name was removed. Continue taking this medication, and follow the directions you see here.   multivitamin with minerals Tabs tablet Take 1 tablet by mouth daily.   nitroGLYCERIN 0.4 MG SL tablet Commonly known as: NITROSTAT Place 1 tablet (0.4 mg total) under the tongue every 5 (five) minutes x 3 doses as needed for chest pain (if no relief after 3rd dose, proceed to the ED for an evaluation or call 911).   pantoprazole 40 MG tablet Commonly known as: PROTONIX Take 40 mg by mouth daily as needed (for acid reflux).   predniSONE 5 MG tablet Commonly known as: DELTASONE Take 5 mg by mouth 2 (two) times daily.   rosuvastatin 40 MG tablet Commonly known as:  CRESTOR Take 1 tablet (40 mg total) by mouth daily after supper.   saccharomyces boulardii 250 MG capsule Commonly known as: FLORASTOR Take 1 capsule (250 mg total) by mouth 2 (two) times daily for 10 days.   traMADol 50 MG tablet Commonly known as: ULTRAM Take 50 mg by mouth every 6 (six) hours as needed for moderate pain or severe pain.   vancomycin 125 MG capsule Commonly known as: Vancocin HCl Take 1 capsule (125 mg total) by mouth 4 (four) times daily for 12 days.   VITAMIN B-1 PO Take 1 tablet by mouth every morning.   Zytiga 250 MG tablet Generic drug: abiraterone acetate Take 4 tablets by mouth every morning.       Discharge Assessment: Vitals:   02/06/20 0405 02/06/20 1432  BP: 132/76   Pulse: 73   Resp: 20   Temp: 100.1 F (37.8 C)   SpO2: 96% 97%   Skin clean, dry and intact without evidence of skin break down, no evidence of skin tears noted. IV catheter discontinued intact. Site without signs and symptoms of complications - no redness or edema noted at insertion site, patient denies c/o pain - only slight tenderness at site.  Dressing with slight pressure applied.  D/c Instructions-Education: Discharge instructions given to patient/family with verbalized understanding. D/c education completed with patient/family including follow up instructions, medication list, d/c activities limitations if indicated, with other d/c instructions  as indicated by MD - patient able to verbalize understanding, all questions fully answered. Patient instructed to return to ED, call 911, or call MD for any changes in condition.  Patient escorted via [place], and D/C home via private auto.  Santa Lighter, RN 02/06/2020 5:49 PM

## 2020-02-06 NOTE — Plan of Care (Signed)
  Problem: Education: Goal: Knowledge of General Education information will improve Description Including pain rating scale, medication(s)/side effects and non-pharmacologic comfort measures Outcome: Progressing   Problem: Health Behavior/Discharge Planning: Goal: Ability to manage health-related needs will improve Outcome: Progressing   

## 2020-02-06 NOTE — Progress Notes (Signed)
Subjective:  Patient feeling a lot better. Had only one stool during the night, about 5 in last 24 hours. Stool consistency improving. No abdominal pain or vomiting. Tolerated full liquid diet.   Objective: Vital signs in last 24 hours: Temp:  [98.2 F (36.8 C)-100.1 F (37.8 C)] 100.1 F (37.8 C) (07/08 0405) Pulse Rate:  [66-73] 73 (07/08 0405) Resp:  [20] 20 (07/08 0405) BP: (113-132)/(73-76) 132/76 (07/08 0405) SpO2:  [96 %-99 %] 96 % (07/08 0405) Last BM Date: 02/05/20 General:   Alert,  Well-developed, well-nourished, pleasant and cooperative in NAD Head:  Normocephalic and atraumatic. Eyes:  Sclera clear, no icterus.  Abdomen:  Soft, nontender and nondistended. No masses, hepatosplenomegaly or hernias noted. Normal bowel sounds, without guarding, and without rebound.   Extremities:  Without clubbing, deformity or edema. Neurologic:  Alert and  oriented x4;  grossly normal neurologically. Skin:  Intact without significant lesions or rashes. Psych:  Alert and cooperative. Normal mood and affect.  Intake/Output from previous day: 07/07 0701 - 07/08 0700 In: 240 [P.O.:240] Out: -  Intake/Output this shift: No intake/output data recorded.  Lab Results: CBC Recent Labs    02/04/20 1331 02/05/20 0413 02/06/20 0340  WBC 8.8 6.3 6.2  HGB 11.5* 9.7* 9.6*  HCT 36.0* 30.3* 30.3*  MCV 97.8 97.1 97.4  PLT 357 305 303   BMET Recent Labs    02/03/20 1003 02/04/20 1331 02/05/20 0413  NA 137 131* 133*  K 3.8 3.6 3.0*  CL 100 97* 103  CO2 26 24 23   GLUCOSE 97 98 155*  BUN 24* 21 18  CREATININE 1.32* 1.43* 1.29*  CALCIUM 9.6 9.0 8.0*   LFTs Recent Labs    02/03/20 1003 02/04/20 1331 02/05/20 0413  BILITOT 0.8 1.1 0.7  ALKPHOS 77 78 60  AST 27 26 18   ALT 38 33 24  PROT 6.4* 6.3* 4.8*  ALBUMIN 3.1* 3.0* 2.2*   Recent Labs    02/03/20 0958  LIPASE 53*   PT/INR Recent Labs    02/04/20 1331  LABPROT 15.1  INR 1.2      Imaging Studies: DG Chest 2  View  Result Date: 02/04/2020 CLINICAL DATA:  Suspected sepsis EXAM: CHEST - 2 VIEW COMPARISON:  02/03/2020 FINDINGS: No new consolidation or edema. No pleural effusion or pneumothorax. Stable cardiomediastinal contours with normal heart size. No acute osseous abnormality. IMPRESSION: No acute process in the chest. Electronically Signed   By: Macy Mis M.D.   On: 02/04/2020 13:53   DG Chest 2 View  Result Date: 02/03/2020 CLINICAL DATA:  Fever EXAM: CHEST - 2 VIEW COMPARISON:  January 23, 2020 FINDINGS: The heart size and mediastinal contours are stable. Both lungs are clear. The visualized skeletal structures are unremarkable. IMPRESSION: No active cardiopulmonary disease. Electronically Signed   By: Abelardo Diesel M.D.   On: 02/03/2020 10:47   CT ABDOMEN PELVIS W CONTRAST  Result Date: 02/04/2020 CLINICAL DATA:  Liver mass, abdominal pain EXAM: CT ABDOMEN AND PELVIS WITH CONTRAST TECHNIQUE: Multidetector CT imaging of the abdomen and pelvis was performed using the standard protocol following bolus administration of intravenous contrast. CONTRAST:  161mL OMNIPAQUE IOHEXOL 300 MG/ML  SOLN COMPARISON:  MR abdomen pelvis, 01/21/2020, noncontrast CT abdomen pelvis, 01/21/2020 FINDINGS: Lower chest: No acute abnormality. Three-vessel coronary artery calcifications. Hepatobiliary: Redemonstrated peripherally enhancing hypodense mass of the inferior tip of the right lobe of the liver, hepatic segment VI, measuring approximately 2.5 cm on this examination, better characterized by prior multiphasic contrast  enhanced MRI. Gallstones in the dependent gallbladder. No gallbladder wall thickening, or biliary dilatation. Pancreas: Redemonstrated low-attenuation lesion of the pancreatic head adjacent to the common bile duct measuring 1.5 cm, again better characterized by prior multiphasic MRI. No pancreatic ductal dilatation or surrounding inflammatory changes. Spleen: Normal in size without significant abnormality.  Adrenals/Urinary Tract: Adrenal glands are unremarkable. Kidneys are normal, without renal calculi, solid lesion, or hydronephrosis. Bladder is unremarkable. Stomach/Bowel: Stomach is within normal limits. Status post appendectomy. Occasional sigmoid diverticula, with wall thickening and fat stranding about a prominent diverticulum of the mid sigmoid (series 2, image 92). Vascular/Lymphatic: Aortic atherosclerosis. No enlarged abdominal or pelvic lymph nodes. Reproductive: Status post prostatectomy. Other: No abdominal wall hernia or abnormality. No abdominopelvic ascites. Musculoskeletal: No acute or significant osseous findings. IMPRESSION: 1. Occasional sigmoid diverticula, with wall thickening and fat stranding about a prominent diverticulum of the mid sigmoid, consistent with acute diverticulitis. No evidence of perforation or abscess. 2. Redemonstrated peripherally enhancing hypodense mass of the inferior tip of the right lobe of the liver, hepatic segment VI, measuring approximately 2.5 cm on this examination, better characterized by prior multiphasic contrast enhanced MRI. 3. Redemonstrated low-attenuation lesion of the pancreatic head adjacent to the common bile duct measuring 1.5 cm, again better characterized by prior multiphasic MRI. 4. Status post prostatectomy. 5. Coronary artery disease.  Aortic Atherosclerosis (ICD10-I70.0). Electronically Signed   By: Eddie Candle M.D.   On: 02/04/2020 16:40   MR 3D Recon At Scanner  Result Date: 01/21/2020 CLINICAL DATA:  Evaluate liver and pancreas lesions EXAM: MRI ABDOMEN WITHOUT AND WITH CONTRAST (INCLUDING MRCP) TECHNIQUE: Multiplanar multisequence MR imaging of the abdomen was performed both before and after the administration of intravenous contrast. Heavily T2-weighted images of the biliary and pancreatic ducts were obtained, and three-dimensional MRCP images were rendered by post processing. CONTRAST:  101mL GADAVIST GADOBUTROL 1 MMOL/ML IV SOLN  COMPARISON:  Same day CT abdomen pelvis, 01/21/2020, 04/08/2016, 10/29/2007 FINDINGS: Examination is generally limited by extensive breath motion artifact throughout. Lower chest: Trace pleural effusions. Hepatobiliary: There is a subcapsular mass in the inferior tip of the right lobe of the liver, hepatic segment VI, measuring 3.3 x 2.5 cm, with slight heterogeneous internal T2 hyperintensity, diffusion restriction, and demonstrating very gradual progressive peripheral nodular enhancement (series 19, image 49). An additional lesion in the posterior right lobe of the liver, hepatic segment VI, measures 0.8 cm and is too small to characterize although likely a subcentimeter cyst or hemangioma. Multiple small gallstones in the dependent gallbladder. Pancreas: There is a lobulated fluid signal, nonenhancing lesion of the pancreatic head immediately adjacent to the central common bile duct measuring 1.5 x 1.4 cm (series 22, image 25, series 4, image 22). No pancreatic ductal dilatation. Spleen:  Within normal limits in size and appearance. Adrenals/Urinary Tract: No masses identified. No evidence of hydronephrosis. Stomach/Bowel: Visualized portions within the abdomen are unremarkable. Vascular/Lymphatic: No pathologically enlarged lymph nodes identified. No abdominal aortic aneurysm demonstrated. Other:  None. Musculoskeletal: No suspicious bone lesions identified. IMPRESSION: 1. Examination is limited by extensive breath motion artifact throughout. 2. There is a subcapsular mass in the inferior tip of the right lobe of the liver, hepatic segment VI, measuring 3.3 x 2.5 cm, with very gradual progressive peripheral nodular enhancement. 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 3. An additional lesion in the posterior right lobe of the liver, hepatic segment VI, measures 0.8 cm  and is too small to characterize although  statistically likely a subcentimeter cyst or hemangioma. 4. There is a lobulated fluid signal, nonenhancing lesion of the pancreatic head immediately adjacent to the central common bile duct measuring 1.5 cm. 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 No further follow-up is required. This recommendation follows ACR consensus guidelines: Management of Incidental Pancreatic Cysts: A White Paper of the ACR Incidental Findings Committee. [place] [id number];[id number]. Electronically Signed   By: Eddie Candle M.D.   On: 01/21/2020 13:57   DG CHEST PORT 1 VIEW  Result Date: 01/23/2020 CLINICAL DATA:  New onset wheezing and shortness of breath EXAM: PORTABLE CHEST 1 VIEW COMPARISON:  10/26/2016 FINDINGS: Post median sternotomy for CABG. Stable heart size. Hilar structures are normal. Lungs are clear.  Question small RIGHT effusion. Visualized skeletal structures on limited assessment are unremarkable. IMPRESSION: Question small RIGHT effusion.  No sign of consolidation. Electronically Signed   By: Zetta Bills M.D.   On: 01/23/2020 12:47   DG Epidural/Nerve Root  Result Date: 01/13/2020 CLINICAL DATA:  Connective tissue a disc stenosis of intervertebral foramina of the lumbar region. Lumbar fusion L3-4 and L4-5. Transitional anatomy. Adjacent level disease with a broad-based disc protrusion at L2-3 FLUOROSCOPY TIME:  Radiation Exposure Index (as provided by the fluoroscopic device): 43.11 uGy*m2 PROCEDURE: SELECTIVE NERVE ROOT AND TRANSFORAMINAL EPIDURAL STEROID INJECTION: A transforaminal approach was performed on the right at the L2-3 foramen. The overlying skin was cleansed and anesthetized. A 22 gauge spinal needle was advanced into the foramen from a lateral oblique approach. Injection of 2cc of Isovue-M 200 outlined the nerve root and extended into the epidural space. No vascular or intrathecal  spread is evident. I then injected 60 mg of Depo-Medrol and 0.75 ml of 1% lidocaine. The patient tolerated the procedure without evidence for complication. A transforaminal approach was performed on the left at the L2-3 foramen. The overlying skin was cleansed and anesthetized. A 22 gauge spinal needle was advanced into the foramen from a lateral oblique approach. Injection of 2cc of Isovue-M 200 outlined the nerve root and extended into the epidural space. No vascular or intrathecal spread is evident. I then injected 60 mg of Depo-Medrol and 0.75 ml of 1% lidocaine. The patient tolerated the procedure without evidence for complication. The patient was observed for 20 minutes prior to discharge in stable neurologic condition. IMPRESSION: Technically successful selective nerve root block and transforaminal epidural steroid injection bilaterally at the L2-3 foramina. Electronically Signed   By: San Morelle M.D.   On: 01/13/2020 15:29   MR ABDOMEN MRCP W WO CONTAST  Result Date: 01/21/2020 CLINICAL DATA:  Evaluate liver and pancreas lesions EXAM: MRI ABDOMEN WITHOUT AND WITH CONTRAST (INCLUDING MRCP) TECHNIQUE: Multiplanar multisequence MR imaging of the abdomen was performed both before and after the administration of intravenous contrast. Heavily T2-weighted images of the biliary and pancreatic ducts were obtained, and three-dimensional MRCP images were rendered by post processing. CONTRAST:  27mL GADAVIST GADOBUTROL 1 MMOL/ML IV SOLN COMPARISON:  Same day CT abdomen pelvis, 01/21/2020, 04/08/2016, 10/29/2007 FINDINGS: Examination is generally limited by extensive breath motion artifact throughout. Lower chest: Trace pleural effusions. Hepatobiliary: There is a subcapsular mass in the inferior tip of the right lobe of the liver, hepatic segment VI, measuring 3.3 x 2.5 cm, with slight heterogeneous internal T2 hyperintensity, diffusion restriction, and demonstrating very gradual progressive peripheral  nodular  enhancement (series 19, image 49). An additional lesion in the posterior right lobe of the liver, hepatic segment VI, measures 0.8 cm and is too small to characterize although likely a subcentimeter cyst or hemangioma. Multiple small gallstones in the dependent gallbladder. Pancreas: There is a lobulated fluid signal, nonenhancing lesion of the pancreatic head immediately adjacent to the central common bile duct measuring 1.5 x 1.4 cm (series 22, image 25, series 4, image 22). No pancreatic ductal dilatation. Spleen:  Within normal limits in size and appearance. Adrenals/Urinary Tract: No masses identified. No evidence of hydronephrosis. Stomach/Bowel: Visualized portions within the abdomen are unremarkable. Vascular/Lymphatic: No pathologically enlarged lymph nodes identified. No abdominal aortic aneurysm demonstrated. Other:  None. Musculoskeletal: No suspicious bone lesions identified. IMPRESSION: 1. Examination is limited by extensive breath motion artifact throughout. 2. There is a subcapsular mass in the inferior tip of the right lobe of the liver, hepatic segment VI, measuring 3.3 x 2.5 cm, with very gradual progressive peripheral nodular enhancement. 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 3. An additional lesion in the posterior right lobe of the liver, hepatic segment VI, measures 0.8 cm and is too small to characterize although statistically likely a subcentimeter cyst or hemangioma. 4. There is a lobulated fluid signal, nonenhancing lesion of the pancreatic head immediately adjacent to the central common bile duct measuring 1.5 cm. 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 No further follow-up is required. This recommendation follows ACR consensus guidelines: Management of Incidental  Pancreatic Cysts: A White Paper of the ACR Incidental Findings Committee. [place] [id number];[id number]. Electronically Signed   By: Eddie Candle M.D.   On: 01/21/2020 13:57   ECHOCARDIOGRAM COMPLETE  Result Date: 01/23/2020    ECHOCARDIOGRAM REPORT   Patient Name:   JONOTHAN HEBERLE Date of Exam: 01/23/2020 Medical Rec #:  [id number]    Height:       66.0 in Accession #:    [id number]   Weight:       162.0 lb Date of Birth:  [birth year]    BSA:          1.829 m Patient Age:    79 years     BP:           128/92 mmHg Patient Gender: M            HR:           97 bpm. Exam Location:  [facility] Procedure: 2D Echo Indications:    Bacteremia 790.7 / R78.81  History:        Patient has prior history of Echocardiogram examinations, most                 recent 04/12/2011. Arrythmias:Atrial Fibrillation and Atrial                 Flutter; Risk Factors:Hypertension, Dyslipidemia and Non-Smoker.                 Prostate cancer.  Sonographer:    Leavy Cella RDCS (AE) Referring Phys: [place]  1. Left ventricular ejection fraction, by estimation, is 60 to 65%. The left ventricle has normal function. The left ventricle has no regional wall motion abnormalities. There is mild left ventricular hypertrophy. Left ventricular diastolic parameters are consistent with Grade I diastolic  dysfunction (impaired relaxation).  2. Right ventricular systolic function is normal. The right ventricular size is normal. There is mildly elevated pulmonary artery systolic pressure.  3. The mitral valve is normal in structure. No evidence of mitral valve regurgitation. No evidence of mitral stenosis.  4. The aortic valve is tricuspid. Aortic valve regurgitation is not visualized. No aortic stenosis is present.  5. The inferior vena cava is normal in size with greater than 50% respiratory variability, suggesting right atrial pressure of 3 mmHg. FINDINGS  Left Ventricle: Left ventricular ejection fraction, by estimation, is  60 to 65%. The left ventricle has normal function. The left ventricle has no regional wall motion abnormalities. The left ventricular internal cavity size was normal in size. There is  mild left ventricular hypertrophy. Left ventricular diastolic parameters are consistent with Grade I diastolic dysfunction (impaired relaxation). Normal left ventricular filling pressure. Right Ventricle: The right ventricular size is normal. No increase in right ventricular wall thickness. Right ventricular systolic function is normal. There is mildly elevated pulmonary artery systolic pressure. The tricuspid regurgitant velocity is 2.81  m/s, and with an assumed right atrial pressure of 10 mmHg, the estimated right ventricular systolic pressure is 65.0 mmHg. Left Atrium: Left atrial size was normal in size. Right Atrium: Right atrial size was normal in size. Pericardium: There is no evidence of pericardial effusion. Mitral Valve: The mitral valve is normal in structure. No evidence of mitral valve regurgitation. No evidence of mitral valve stenosis. Tricuspid Valve: The tricuspid valve is normal in structure. Tricuspid valve regurgitation is mild . No evidence of tricuspid stenosis. Aortic Valve: The aortic valve is tricuspid. Aortic valve regurgitation is not visualized. No aortic stenosis is present. Aortic valve mean gradient measures 3.1 mmHg. Aortic valve peak gradient measures 6.2 mmHg. Aortic valve area, by VTI measures 2.29 cm. Pulmonic Valve: The pulmonic valve was not well visualized. Pulmonic valve regurgitation is not visualized. No evidence of pulmonic stenosis. Aorta: The aortic root is normal in size and structure. Pulmonary Artery: Mild pulmonary HTN, PASP is 35 mmHg. Venous: The inferior vena cava is normal in size with greater than 50% respiratory variability, suggesting right atrial pressure of 3 mmHg. IAS/Shunts: No atrial level shunt detected by color flow Doppler.  LEFT VENTRICLE PLAX 2D LVIDd:         3.76 cm   Diastology LVIDs:         2.57 cm  LV e' lateral:   10.10 cm/s LV PW:         1.10 cm  LV E/e' lateral: 6.5 LV IVS:        1.12 cm  LV e' medial:    7.51 cm/s LVOT diam:     1.80 cm  LV E/e' medial:  8.7 LV SV:         45 LV SV Index:   25 LVOT Area:     2.54 cm  RIGHT VENTRICLE RV S prime:     14.60 cm/s TAPSE (M-mode): 1.5 cm LEFT ATRIUM           Index       RIGHT ATRIUM          Index LA diam:      3.20 cm 1.75 cm/m  RA Area:     9.39 cm LA Vol (A2C): 28.4 ml 15.53 ml/m RA Volume:   20.50 ml 11.21 ml/m LA Vol (A4C): 34.5 ml 18.87 ml/m  AORTIC VALVE AV Area (Vmax):    2.10 cm AV  Area (Vmean):   2.08 cm AV Area (VTI):     2.29 cm AV Vmax:           124.23 cm/s AV Vmean:          83.099 cm/s AV VTI:            0.196 m AV Peak Grad:      6.2 mmHg AV Mean Grad:      3.1 mmHg LVOT Vmax:         102.35 cm/s LVOT Vmean:        68.044 cm/s LVOT VTI:          0.176 m LVOT/AV VTI ratio: 0.90  AORTA Ao Root diam: 3.30 cm MITRAL VALVE               TRICUSPID VALVE MV Area (PHT): 3.68 cm    TR Peak grad:   31.6 mmHg MV Decel Time: 206 msec    TR Vmax:        281.00 cm/s MV E velocity: 65.50 cm/s MV A velocity: 77.10 cm/s  SHUNTS MV E/A ratio:  0.85        Systemic VTI:  0.18 m                            Systemic Diam: 1.80 cm Carlyle Dolly MD Electronically signed by Carlyle Dolly MD Signature Date/Time: 01/23/2020/11:50:45 AM    Final    CT RENAL STONE STUDY  Result Date: 01/21/2020 CLINICAL DATA:  Back pain, dysuria EXAM: CT ABDOMEN AND PELVIS WITHOUT CONTRAST TECHNIQUE: Multidetector CT imaging of the abdomen and pelvis was performed following the standard protocol without IV contrast. COMPARISON:  03/08/2016 FINDINGS: Lower chest: 5 mm noncalcified posterior right lower lobe pulmonary nodule (series 4, image 40), new from prior. Mild right greater than left bibasilar subsegmental atelectasis. Coronary artery calcifications. Hepatobiliary: Slightly ill-defined 3.0 cm lesion within the inferior aspect of  the right hepatic lobe (series 2, image 38), new from prior. There is at least 1 additional subcentimeter indeterminate lesions within the liver (series 2, image 28), not definitively seen on prior. Cholelithiasis. Gallbladder is mildly distended. No biliary dilatation. Pancreas: Subtle area of relative low attenuation within the posterior aspect of the pancreatic head which appears new from prior (series 2, images 28-29). No peripancreatic stranding. No pancreatic ductal dilatation. No peripancreatic fluid. Spleen: Normal in size without focal abnormality. Adrenals/Urinary Tract: Adrenal glands are unremarkable. Kidneys are normal, without renal calculi, focal lesion, or hydronephrosis. Bladder is decompressed, limiting its evaluation. Stomach/Bowel: Stomach is within normal limits. Appendix not visualized, and may be surgically absent. Scattered colonic diverticulosis. No evidence of bowel wall thickening, distention, or inflammatory changes. Vascular/Lymphatic: Aortoiliac atherosclerotic calcification without aneurysm. Mild ectasia of the right common iliac artery, unchanged. No enlarged abdominal or pelvic lymph nodes. Reproductive: Prostatectomy. Other: No abdominal wall hernia or abnormality. No abdominopelvic ascites. Musculoskeletal: L3-5 PLIF. No acute osseous findings. The sclerotic osseous lesions seen on prior CT 03/08/2016 have resolved. IMPRESSION: 1. Subtle area of relative low attenuation within the posterior aspect of the pancreatic head which appears new from prior. Pancreatic mass is not excluded. When the patient is clinically stable and able to follow directions and hold their breath (preferably as an outpatient) further evaluation with dedicated contrast-enhanced pancreatic protocol MRI of the abdomen should be considered. 2. New slightly ill-defined 3.0 cm lesion within the inferior aspect of the right hepatic lobe with at least  1 additional subcentimeter indeterminate lesions within the  liver. Findings are nonspecific but could reflect hepatic metastatic disease. These findings can also be further evaluated on the previously recommended MRI. 3. 5 mm noncalcified right lower lobe pulmonary nodule, new from prior. 4. The sclerotic osseous lesions seen on prior CT 03/08/2016 have resolved. 5. Cholelithiasis without evidence of acute cholecystitis. 6. Colonic diverticulosis without evidence of acute diverticulitis. 7. Aortic atherosclerosis. (ICD10-I70.0). Electronically Signed   By: Davina Poke D.O.   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 Lumbar fusion L3-4 and L4-5. Transitional anatomy. Adjacent level disease with a broad-based disc protrusion at L2-3 FLUOROSCOPY TIME:  Radiation Exposure Index (as provided by the fluoroscopic device): 43.11 uGy*m2 PROCEDURE: SELECTIVE NERVE ROOT AND TRANSFORAMINAL EPIDURAL STEROID INJECTION: A transforaminal approach was performed on the right at the L2-3 foramen. The overlying skin was cleansed and anesthetized. A 22 gauge spinal needle was advanced into the foramen from a lateral oblique approach. Injection of 2cc of Isovue-M 200 outlined the nerve root and extended into the epidural space. No vascular or intrathecal spread is evident. I then injected 60 mg of Depo-Medrol and 0.75 ml of 1% lidocaine. The patient tolerated the procedure without evidence for complication. A transforaminal approach was performed on the left at the L2-3 foramen. The overlying skin was cleansed and anesthetized. A 22 gauge spinal needle was advanced into the foramen from a lateral oblique approach. Injection of 2cc of Isovue-M 200 outlined the nerve root and extended into the epidural space. No vascular or intrathecal spread is evident. I then injected 60 mg of Depo-Medrol and 0.75 ml of 1% lidocaine. The patient  tolerated the procedure without evidence for complication. The patient was observed for 20 minutes prior to discharge in stable neurologic condition. IMPRESSION: Technically successful selective nerve root block and transforaminal epidural steroid injection bilaterally at the L2-3 foramina. Electronically Signed   By: San Morelle M.D.   On: 01/13/2020 15:29  [2 weeks]   Assessment: Pleasant 79 year old gentleman with recent hospitalization for streptococcal sepsis thought to be originating from cholangitis given that he had elevated transaminases and cholelithiasis, it was felt that he had passed a stone spontaneously.  He responded to antibiotic therapy.  Returns now with recurrence of fever and chills, diarrhea.  Urine and blood cultures remain negative.  CT showed new changes marked inflammatory changes in the sigmoid region, reported as diverticulitis, but felt to be due to Cdiff given patient's presentation and positive Cdiff screen. He has been responding to oral vancomycin.   Cdiff: improved. Stool consistency and frequency improving. Tolerating full liquids.  Liver lesion: He also has a peripheral enhancing lesion involving the hepatic segment 6 concerning for neoplasm.  He is scheduled for CT guided liver biopsy in 3 days but plans to reschedule for later this month given acute illness.  Anemia: likely secondary to recent acute illnesses.  He has no gross GI bleeding.  Hemoglobin is stable the last 24 hours. Continue to monitor.  Plan: 1. Complete oral vancomycin 125 mg 4 times daily for 2 weeks. 2. Continue probiotics. 3. Plan for liver biopsy later this month, to be arranged by Dr. Laural Golden.  4. Advance diet.   Laureen Ochs. Bernarda Caffey [facility] [phone] 7/8/20219:40 AM       LOS: 2 days

## 2020-02-07 ENCOUNTER — Ambulatory Visit (HOSPITAL_COMMUNITY): Admission: RE | Admit: 2020-02-07 | Payer: Medicare Other | Source: Ambulatory Visit

## 2020-02-08 LAB — CULTURE, BLOOD (ROUTINE X 2)
Culture: NO GROWTH
Special Requests: ADEQUATE

## 2020-02-10 LAB — CULTURE, BLOOD (ROUTINE X 2)
Culture: NO GROWTH
Culture: NO GROWTH
Special Requests: ADEQUATE
Special Requests: ADEQUATE

## 2020-02-12 ENCOUNTER — Ambulatory Visit (INDEPENDENT_AMBULATORY_CARE_PROVIDER_SITE_OTHER): Payer: Medicare Other | Admitting: Gastroenterology

## 2020-02-12 NOTE — Progress Notes (Signed)
Cardiology Office Note    Date:  02/17/2020   ID:  Samuel Gregory, DOB 10/09/1940, MRN [id number]  PCP:  Celene Squibb, MD  Cardiologist: Rozann Lesches, MD EPS: None  Chief Complaint  Patient presents with  . Hospitalization Follow-up    History of Present Illness:  Samuel Gregory is a 79 y.o. male with past medical history of CAD (s/p CABG in 2000, NST in 10/2017 showing mild to moderate per-infarct ischemia with medical management recommended), HTN, HLD and prostate cancer (with bone mets)    Patient discharged 01/27/2020 with a new diagnosis of paroxysmal atrial flutter in setting of severe systemic illness with sepsis treated with metoprolol 75 mg twice daily.  Initially anticoagulation was not started due to newly diagnosed liver lesion but after discussion with GI okay to start Eliquis.  Dr. Harl Bowie recommend outpatient monitor to see if atrial flutter was isolated in the setting of severe systemic illness or is a longer-term issue.  Patient was discharged 02/06/2020 with acute diverticulitis diagnosis. EKG 02/03/20 NSR.  Patient is feeling better. Says he's had some fluttering in his chest infrequently in the past 2 years but not sure if it was the atrial flutter. Has had ankle swelling the past few months sometimes worse than others. Watches his salt closely and tries to keep them elevated.has been on higher dose of prednisone recently for back pain but now back on lower dose. Scheduled for liver biopsy next week and has to hold eliquis for 2 days.  Past Medical History:  Diagnosis Date  . Arthritis   . Collagen vascular disease ([place])   . Coronary atherosclerosis of native coronary artery    Multivessel status post CABG 2000  . Essential hypertension   . Hyperlipidemia   . Prostate cancer [facility])     Past Surgical History:  Procedure Laterality Date  . APPENDECTOMY     8 yrs ago- Dr Geroge Baseman  . CATARACT EXTRACTION W/PHACO Left 07/14/2014   Procedure: CATARACT EXTRACTION PHACO  AND INTRAOCULAR LENS PLACEMENT (IOC);  Surgeon: Tonny Branch, MD;  Location: [facility] ORS;  Service: Ophthalmology;  Laterality: Left;  CDE:6.20  . CATARACT EXTRACTION W/PHACO Right 08/11/2014   Procedure: CATARACT EXTRACTION PHACO AND INTRAOCULAR LENS PLACEMENT RIGHT EYE;  Surgeon: Tonny Branch, MD;  Location: [facility] ORS;  Service: Ophthalmology;  Laterality: Right;  CDE:10.11  . COLONOSCOPY N/A 01/14/2015   Procedure: COLONOSCOPY;  Surgeon: Rogene Houston, MD;  Location: [facility] END[facility] SUITE;  Service: Endoscopy;  Laterality: N/A;  730  . CORONARY ARTERY BYPASS GRAFT  01/1999   LIMA to LAD, SVG to diagonal, sequential SVG to PDA and PLB  . PROSTATECTOMY  2004   [name]  . TONSILLECTOMY     age 75    Current Medications: Current Meds  Medication Sig  . acetaminophen (TYLENOL) 325 MG tablet Take 2 tablets (650 mg total) by mouth every 6 (six) hours as needed for mild pain, fever or headache (or Fever >/= 101).  Marland Kitchen apixaban (ELIQUIS) 5 MG TABS tablet Take 1 tablet (5 mg total) by mouth 2 (two) times daily.  . Calcium-Magnesium-Vitamin D (CALCIUM 1200+D3 PO) Take 1 tablet by mouth daily.   . cholecalciferol (VITAMIN D3) 25 MCG (1000 UNIT) tablet Take 1,000 Units by mouth daily.  . fluticasone (FLONASE) 50 MCG/ACT nasal spray Place 1 spray into both nostrils daily as needed for allergies or rhinitis.  . metoprolol tartrate 75 MG TABS Take 75 mg by mouth 2 (two) times daily.  Marland Kitchen  Multiple Vitamin (MULTIVITAMIN WITH MINERALS) TABS Take 1 tablet by mouth daily.  . nitroGLYCERIN (NITROSTAT) 0.4 MG SL tablet Place 0.4 mg under the tongue every 5 (five) minutes as needed for chest pain.  . pantoprazole (PROTONIX) 40 MG tablet Take 40 mg by mouth daily as needed (for acid reflux).   . predniSONE (DELTASONE) 5 MG tablet Take 5 mg by mouth 2 (two) times daily.   . rosuvastatin (CRESTOR) 40 MG tablet Take 1 tablet (40 mg total) by mouth daily after supper.  . Thiamine HCl (VITAMIN B-1 PO) Take 1 tablet by mouth every  morning.   Marland Kitchen ZYTIGA 250 MG tablet Take 4 tablets by mouth every morning.   . [DISCONTINUED] vancomycin (VANCOCIN HCL) 125 MG capsule Take 1 capsule (125 mg total) by mouth 4 (four) times daily for 12 days.     Allergies:   Latex and Sulfa antibiotics   Social History   Socioeconomic History  . Marital status: Married    Spouse name: Not on file  . Number of children: Not on file  . Years of education: Not on file  . Highest education level: Not on file  Occupational History  . Not on file  Tobacco Use  . Smoking status: Never Smoker  . Smokeless tobacco: Never Used  Vaping Use  . Vaping Use: Never used  Substance and Sexual Activity  . Alcohol use: Yes    Alcohol/week: 0.0 standard drinks    Comment: daily-glass of wine  . Drug use: No  . Sexual activity: Not on file  Other Topics Concern  . Not on file  Social History Narrative  . Not on file   Social Determinants of Health   Financial Resource Strain:   . Difficulty of Paying Living Expenses:   Food Insecurity:   . Worried About Charity fundraiser in the Last Year:   . Arboriculturist in the Last Year:   Transportation Needs:   . Film/video editor (Medical):   Marland Kitchen Lack of Transportation (Non-Medical):   Physical Activity:   . Days of Exercise per Week:   . Minutes of Exercise per Session:   Stress:   . Feeling of Stress :   Social Connections:   . Frequency of Communication with Friends and Family:   . Frequency of Social Gatherings with Friends and Family:   . Attends Religious Services:   . Active Member of Clubs or Organizations:   . Attends Archivist Meetings:   Marland Kitchen Marital Status:      Family History:  The patient's   family history includes Aneurysm in his father; Cancer in his mother; Heart attack in his mother.   ROS:   Please see the history of present illness.    ROS All other systems reviewed and are negative.   PHYSICAL EXAM:   VS:  BP (!) 104/58   Pulse 64   Ht 5\' 8"   (1.727 m)   Wt 158 lb (71.7 kg)   SpO2 99%   BMI 24.02 kg/m   Physical Exam  GEN: Well nourished, well developed, in no acute distress  Neck: no JVD, carotid bruits, or masses Cardiac:RRR; no murmurs, rubs, or gallops  Respiratory:  clear to auscultation bilaterally, normal work of breathing GI: soft, nontender, nondistended, + BS Ext: ankle edema bilaterally otherwise without cyanosis, clubbing, Good distal pulses bilaterally Neuro:  Alert and Oriented x 3, Strength and sensation are intact Psych: euthymic mood, full affect  Wt Readings  from Last 3 Encounters:  02/17/20 158 lb (71.7 kg)  02/04/20 155 lb 13.8 oz (70.7 kg)  02/03/20 169 lb (76.7 kg)      Studies/Labs Reviewed:   EKG:  EKG is not ordered today.   Recent Labs: 01/24/2020: Magnesium 2.3 02/05/2020: ALT 24; BUN 18; Creatinine, Ser 1.29; Potassium 3.0; Sodium 133 02/06/2020: Hemoglobin 9.6; Platelets 303   Lipid Panel    Component Value Date/Time   CHOL 155 03/01/2013 0735   TRIG 67 03/01/2013 0735   HDL 49 03/01/2013 0735   CHOLHDL 3.2 03/01/2013 0735   VLDL 13 03/01/2013 0735   LDLCALC 93 03/01/2013 0735    Additional studies/ records that were reviewed today include:  Echo 01/23/20 IMPRESSIONS     1. Left ventricular ejection fraction, by estimation, is 60 to 65%. The  left ventricle has normal function. The left ventricle has no regional  wall motion abnormalities. There is mild left ventricular hypertrophy.  Left ventricular diastolic parameters  are consistent with Grade I diastolic dysfunction (impaired relaxation).   2. Right ventricular systolic function is normal. The right ventricular  size is normal. There is mildly elevated pulmonary artery systolic  pressure.   3. The mitral valve is normal in structure. No evidence of mitral valve  regurgitation. No evidence of mitral stenosis.   4. The aortic valve is tricuspid. Aortic valve regurgitation is not  visualized. No aortic stenosis is present.     5. The inferior vena cava is normal in size with greater than 50%  respiratory variability, suggesting right atrial pressure of 3 mmHg.   FINDINGS   Left Ventricle: Left ventricular ejection fraction, by estimation, is 60  to 65%. The left ventricle has normal function. The left ventricle has no  regional wall motion abnormalities. The left ventricular internal cavity  size was normal in size. There is   mild left ventricular hypertrophy. Left ventricular diastolic parameters  are consistent with Grade I diastolic dysfunction (impaired relaxation).  Normal left ventricular filling pressure.   Right Ventricle: The right ventricular size is normal. No increase in  right ventricular wall thickness. Right ventricular systolic function is  normal. There is mildly elevated pulmonary artery systolic pressure. The  tricuspid regurgitant velocity is 2.81   m/s, and with an assumed right atrial pressure of 10 mmHg, the estimated  right ventricular systolic pressure is 22.0 mmHg.   Left Atrium: Left atrial size was normal in size.   Right Atrium: Right atrial size was normal in size.   Pericardium: There is no evidence of pericardial effusion.   Mitral Valve: The mitral valve is normal in structure. No evidence of  mitral valve regurgitation. No evidence of mitral valve stenosis.   Tricuspid Valve: The tricuspid valve is normal in structure. Tricuspid  valve regurgitation is mild . No evidence of tricuspid stenosis.   Aortic Valve: The aortic valve is tricuspid. Aortic valve regurgitation is  not visualized. No aortic stenosis is present. Aortic valve mean gradient  measures 3.1 mmHg. Aortic valve peak gradient measures 6.2 mmHg. Aortic  valve area, by VTI measures 2.29  cm.   Pulmonic Valve: The pulmonic valve was not well visualized. Pulmonic valve  regurgitation is not visualized. No evidence of pulmonic stenosis.   Aorta: The aortic root is normal in size and structure.    Pulmonary Artery: Mild pulmonary HTN, PASP is 35 mmHg.   Venous: The inferior vena cava is normal in size with greater than 50%  respiratory variability, suggesting right  atrial pressure of 3 mmHg.   IAS/Shunts: No atrial level shunt detected by color flow Doppler.         ASSESSMENT:    1. Paroxysmal atrial fibrillation (HCC)   2. Coronary artery disease involving coronary bypass graft of native heart without angina pectoris   3. Essential hypertension   4. Hyperlipidemia, unspecified hyperlipidemia type   5. Prostate cancer --with bony metastases, status post prior prostatectomy in 2004      PLAN:  In order of problems listed above:  Paroxysmal atrial flutter in the setting of severe systemic illness and sepsis started on metoprolol 75 mg twice daily(was on toprol xl 25 mg daily previously) and Eliquis.  Plan for 30-day monitor to see if this is an isolated incident in the setting of severe systemic illness.  NSR on EKG 02/03/20.  Echo with normal LVEF 60 to 65% grade 1 DD, normal LA size. F/u with Dr. Domenic Polite after monitor  CAD status post CABG in 2000, NST 10/2017 mild to moderate peri-infarct ischemia medical management recommended-no angina  Hypertension BP running low  Hyperlipidemia on crestor  History of prostate cancer with bone mets and new liver lesion and recent admission for diverticulitis followed by GI -for biopsy next week.  Ankle edema for the past couple months-diastolic dysfunction but watches his salt closely. Compression hose and keeps legs elevated.    Medication Adjustments/Labs and Tests Ordered: Current medicines are reviewed at length with the patient today.  Concerns regarding medicines are outlined above.  Medication changes, Labs and Tests ordered today are listed in the Patient Instructions below. Patient Instructions   Medication Instructions:  Your physician recommends that you continue on your current medications as directed. Please  refer to the Current Medication list given to you today.  *If you need a refill on your cardiac medications before your next appointment, please call your pharmacy*   Lab Work: None If you have labs (blood work) drawn today and your tests are completely normal, you will receive your results only by: Marland Kitchen MyChart Message (if you have MyChart) OR . A paper copy in the mail If you have any lab test that is abnormal or we need to change your treatment, we will call you to review the results.   Testing/Procedures: Your physician has recommended that you wear an event monitor. Event monitors are medical devices that record the heart's electrical activity. Doctors most often [place] these monitors to diagnose arrhythmias. Arrhythmias are problems with the speed or rhythm of the heartbeat. The monitor is a small, portable device. You can wear one while you do your normal daily activities. This is usually used to diagnose what is causing palpitations/syncope (passing out).     Follow-Up: At [facility], you and your health needs are our priority.  As part of our continuing mission to provide you with exceptional heart care, we have created designated Provider Care Teams.  These Care Teams include your primary Cardiologist (physician) and Advanced Practice Providers (APPs -  Physician Assistants and Nurse Practitioners) who all work together to provide you with the care you need, when you need it.  We recommend signing up for the patient portal called "MyChart".  Sign up information is provided on this After Visit Summary.  MyChart is used to connect with patients for Virtual Visits (Telemedicine).  Patients are able to view lab/test results, encounter notes, upcoming appointments, etc.  Non-urgent messages can be sent to your provider as well.   To learn more about  what you can do with MyChart, go to [email or web address].    Your next appointment:   6 week(s)  The format for your next appointment:    In Person  Provider:   Rozann Lesches, MD   Other Instructions Wear compression stockings as instructed.  Two Gram Sodium Diet 2000 mg  What is Sodium? Sodium is a mineral found naturally in many foods. The most significant source of sodium in the diet is table salt, which is about 40% sodium.  Processed, convenience, and preserved foods also contain a large amount of sodium.  The body needs only 500 mg of sodium daily to function,  A normal diet provides more than enough sodium even if you do not use salt.  Why Limit Sodium? A build up of sodium in the body can cause thirst, increased blood pressure, shortness of breath, and water retention.  Decreasing sodium in the diet can reduce edema and risk of heart attack or stroke associated with high blood pressure.  Keep in mind that there are many other factors involved in these health problems.  Heredity, obesity, lack of exercise, cigarette smoking, stress and what you eat all play a role.  General Guidelines:  Do not add salt at the table or in cooking.  One teaspoon of salt contains over 2 grams of sodium.  Read food labels  Avoid processed and convenience foods  Ask your dietitian before eating any foods not dicussed in the menu planning guidelines  Consult your physician if you wish to use a salt substitute or a sodium containing medication such as antacids.  Limit milk and milk products to 16 oz (2 cups) per day.  Shopping Hints:  READ LABELS!! "Dietetic" does not necessarily mean low sodium.  Salt and other sodium ingredients are often added to foods during processing.   Menu Planning Guidelines Food Group Choose More Often Avoid  Beverages (see also the milk group All fruit juices, low-sodium, salt-free vegetables juices, low-sodium carbonated beverages Regular vegetable or tomato juices, commercially softened water used for drinking or cooking  Breads and Cereals Enriched white, wheat, rye and pumpernickel bread, hard  rolls and dinner rolls; muffins, cornbread and waffles; most dry cereals, cooked cereal without added salt; unsalted crackers and breadsticks; low sodium or homemade bread crumbs Bread, rolls and crackers with salted tops; quick breads; instant hot cereals; pancakes; commercial bread stuffing; self-rising flower and biscuit mixes; regular bread crumbs or cracker crumbs  Desserts and Sweets Desserts and sweets mad with mild should be within allowance Instant pudding mixes and cake mixes  Fats Butter or margarine; vegetable oils; unsalted salad dressings, regular salad dressings limited to 1 Tbs; light, sour and heavy cream Regular salad dressings containing bacon fat, bacon bits, and salt pork; snack dips made with instant soup mixes or processed cheese; salted nuts  Fruits Most fresh, frozen and canned fruits Fruits processed with salt or sodium-containing ingredient (some dried fruits are processed with sodium sulfites        Vegetables Fresh, frozen vegetables and low- sodium canned vegetables Regular canned vegetables, sauerkraut, pickled vegetables, and others prepared in brine; frozen vegetables in sauces; vegetables seasoned with ham, bacon or salt pork  Condiments, Sauces, Miscellaneous  Salt substitute with physician's approval; pepper, herbs, spices; vinegar, lemon or lime juice; hot pepper sauce; garlic powder, onion powder, low sodium soy sauce (1 Tbs.); low sodium condiments (ketchup, chili sauce, mustard) in limited amounts (1 tsp.) fresh ground horseradish; unsalted tortilla chips, pretzels, potato chips, popcorn, salsa (1/4  cup) Any seasoning made with salt including garlic salt, celery salt, onion salt, and seasoned salt; sea salt, rock salt, kosher salt; meat tenderizers; monosodium glutamate; mustard, regular soy sauce, barbecue, sauce, chili sauce, teriyaki sauce, steak sauce, Worcestershire sauce, and most flavored vinegars; canned gravy and mixes; regular condiments; salted snack  foods, olives, picles, relish, horseradish sauce, catsup   Food preparation: Try these seasonings Meats:    Pork Sage, onion Serve with applesauce  Chicken Poultry seasoning, thyme, parsley Serve with cranberry sauce  Lamb Curry powder, rosemary, garlic, thyme Serve with mint sauce or jelly  Veal Marjoram, basil Serve with current jelly, cranberry sauce  Beef Pepper, bay leaf Serve with dry mustard, unsalted chive butter  Fish [place] leaf, dill Serve with unsalted lemon butter, unsalted parsley butter  Vegetables:    Asparagus Lemon juice   Broccoli Lemon juice   Carrots Mustard dressing parsley, mint, nutmeg, glazed with unsalted butter and sugar   Green beans Marjoram, lemon juice, nutmeg,dill seed   Tomatoes Basil, marjoram, onion   Spice /blend for [employer]" 4 tsp ground thyme 1 tsp ground sage 3 tsp ground rosemary 4 tsp ground marjoram   Test your knowledge 1. A product that says "Salt Free" may still contain sodium. True or False 2. Garlic Powder and Hot Pepper Sauce an be used as alternative seasonings.True or False 3. Processed foods have more sodium than fresh foods.  True or False 4. Canned Vegetables have less sodium than froze True or False  WAYS TO DECREASE YOUR SODIUM INTAKE 1. Avoid the use of added salt in cooking and at the table.  Table salt (and other prepared seasonings which contain salt) is probably one of the greatest sources of sodium in the diet.  Unsalted foods can gain flavor from the sweet, sour, and butter taste sensations of herbs and spices.  Instead of using salt for seasoning, try the following seasonings with the foods listed.  Remember: how you use them to enhance natural food flavors is limited only by your creativity... Allspice-Meat, fish, eggs, fruit, peas, red and yellow vegetables Almond Extract-Fruit baked goods Anise Seed-Sweet breads, fruit, carrots, beets, cottage cheese, cookies (tastes like licorice) Basil-Meat, fish, eggs, vegetables,  rice, vegetables salads, soups, sauces Bay Leaf-Meat, fish, stews, poultry Burnet-Salad, vegetables (cucumber-like flavor) Caraway Seed-Bread, cookies, cottage cheese, meat, vegetables, cheese, rice Cardamon-Baked goods, fruit, soups Celery Powder or seed-Salads, salad dressings, sauces, meatloaf, soup, bread.Do not use  celery salt Chervil-Meats, salads, fish, eggs, vegetables, cottage cheese (parsley-like flavor) Chili Power-Meatloaf, chicken cheese, corn, eggplant, egg dishes Chives-Salads cottage cheese, egg dishes, soups, vegetables, sauces Cilantro-Salsa, casseroles Cinnamon-Baked goods, fruit, pork, lamb, chicken, carrots Cloves-Fruit, baked goods, fish, pot roast, green beans, beets, carrots Coriander-Pastry, cookies, meat, salads, cheese (lemon-orange flavor) Cumin-Meatloaf, fish,cheese, eggs, cabbage,fruit pie (caraway flavor) [employer], fruit, eggs, fish, poultry, cottage cheese, vegetables Dill Seed-Meat, cottage cheese, poultry, vegetables, fish, salads, bread Fennel Seed-Bread, cookies, apples, pork, eggs, fish, beets, cabbage, cheese, Licorice-like flavor Garlic-(buds or powder) Salads, meat, poultry, fish, bread, butter, vegetables, potatoes.Do not  use garlic salt Ginger-Fruit, vegetables, baked goods, meat, fish, poultry Horseradish Root-Meet, vegetables, butter Lemon Juice or Extract-Vegetables, fruit, tea, baked goods, fish salads Mace-Baked goods fruit, vegetables, fish, poultry (taste like nutmeg) Maple Extract-Syrups Marjoram-Meat, chicken, fish, vegetables, breads, green salads (taste like Sage) Mint-Tea, lamb, sherbet, vegetables, desserts, carrots, cabbage Mustard, Dry or Seed-Cheese, eggs, meats, vegetables, poultry Nutmeg-Baked goods, fruit, chicken, eggs, vegetables, desserts Onion Powder-Meat, fish, poultry, vegetables, cheese, eggs, bread, rice salads (  Do not use   Onion salt) Orange Extract-Desserts, baked goods Oregano-Pasta, eggs, cheese,  onions, pork, lamb, fish, chicken, vegetables, green salads Paprika-Meat, fish, poultry, eggs, cheese, vegetables Parsley Flakes-Butter, vegetables, meat fish, poultry, eggs, bread, salads (certain forms may   Contain sodium Pepper-Meat fish, poultry, vegetables, eggs Peppermint Extract-Desserts, baked goods Poppy Seed-Eggs, bread, cheese, fruit dressings, baked goods, noodles, vegetables, cottage  [employer], poultry, meat, fish, cauliflower, turnips,eggs bread Saffron-Rice, bread, veal, chicken, fish, eggs Sage-Meat, fish, poultry, onions, eggplant, tomateos, pork, stews Savory-Eggs, salads, poultry, meat, rice, vegetables, soups, pork Tarragon-Meat, poultry, fish, eggs, butter, vegetables (licorice-like flavor)  Thyme-Meat, poultry, fish, eggs, vegetables, (clover-like flavor), sauces, soups Tumeric-Salads, butter, eggs, fish, rice, vegetables (saffron-like flavor) Vanilla Extract-Baked goods, candy Vinegar-Salads, vegetables, meat marinades Walnut Extract-baked goods, candy  2. Choose your Foods Wisely   The following is a list of foods to avoid which are high in sodium:  Meats-Avoid all smoked, canned, salt cured, dried and kosher meat and fish as well as Anchovies   [employer] meats:Bologna, Liverwurst, Pastrami Canned meat or fish  Marinated herring Caviar    Pepperoni Corned Beef   Pizza Dried chipped beef  Salami Frozen breaded fish or meat Salt pork Frankfurters or hot dogs  Sardines Gefilte fish   Sausage Ham (boiled ham, Proscuitto Smoked butt    spiced ham)   Spam      TV Dinners Vegetables Canned vegetables (Regular) Relish Canned mushrooms  Sauerkraut Olives    Tomato juice Pickles  Bakery and Dessert Products Canned puddings  Cream pies Cheesecake   Decorated cakes Cookies  Beverages/Juices Tomato juice, regular  Gatorade   V-8 vegetable juice, regular  Breads and Cereals Biscuit mixes   Salted  potato chips, corn chips, pretzels Bread stuffing mixes  Salted crackers and rolls Pancake and waffle mixes Self-rising flour  Seasonings Accent    Meat sauces Barbecue sauce  Meat tenderizer Catsup    Monosodium glutamate (MSG) Celery salt   Onion salt Chili sauce   Prepared mustard Garlic salt   Salt, seasoned salt, sea salt Gravy mixes   Soy sauce Horseradish   Steak sauce Ketchup   Tartar sauce Lite salt    Teriyaki sauce Marinade mixes   Worcestershire sauce  Others Baking powder   Cocoa and cocoa mixes Baking soda   Commercial casserole mixes Candy-caramels, chocolate  Dehydrated soups    Bars, fudge,nougats  Instant rice and pasta mixes Canned broth or soup  Maraschino cherries Cheese, aged and processed cheese and cheese spreads  Learning Assessment Quiz  Indicated T (for True) or F (for False) for each of the following statements:  1. _____ Fresh fruits and vegetables and unprocessed grains are generally low in sodium 2. _____ Water may contain a considerable amount of sodium, depending on the source 3. _____ You can always tell if a food is high in sodium by tasting it 4. _____ Certain laxatives my be high in sodium and should be avoided unless prescribed   by a physician or pharmacist 5. _____ Salt substitutes may be used freely by anyone on a sodium restricted diet 6. _____ Sodium is present in table salt, food additives and as a natural component of   most foods 7. _____ Table salt is approximately 90% sodium 8. _____ Limiting sodium intake may help prevent excess fluid accumulation in the body 9. _____ On a sodium-restricted diet, seasonings such as bouillon soy sauce, and    cooking wine  should be used in place of table salt 10. _____ On an ingredient list, a product which lists monosodium glutamate as the first   ingredient is an appropriate food to include on a low sodium diet  Circle the best answer(s) to the following statements (Hint: there may be more than  one correct answer)  11. On a low-sodium diet, some acceptable snack items are:    A. Olives  F. Bean dip   K. Grapefruit juice    B. Salted Pretzels G. Commercial Popcorn   L. Canned peaches    C. Carrot Sticks  H. Bouillon   M. Unsalted nuts   D. [place] fries  I. Peanut butter crackers N. Salami   E. Sweet pickles J. Tomato Juice   O. Pizza  12.  Seasonings that may be used freely on a reduced - sodium diet include   A. Lemon wedges F.Monosodium glutamate K. Celery seed    B.Soysauce   G. Pepper   L. Mustard powder   C. Sea salt  H. Cooking wine  M. Onion flakes   D. Vinegar  E. Prepared horseradish N. Salsa   E. Sage   J. Worcestershire sauce  O. Chutney      Signed, Ermalinda Barrios, PA-C  02/17/2020 1:45 PM    [facility] [address], [place], [place]  [address] Phone: [phone]; Fax: [phone]

## 2020-02-13 DIAGNOSIS — N179 Acute kidney failure, unspecified: Secondary | ICD-10-CM | POA: Diagnosis not present

## 2020-02-13 DIAGNOSIS — A419 Sepsis, unspecified organism: Secondary | ICD-10-CM | POA: Diagnosis not present

## 2020-02-13 DIAGNOSIS — M5441 Lumbago with sciatica, right side: Secondary | ICD-10-CM | POA: Diagnosis not present

## 2020-02-13 DIAGNOSIS — R6521 Severe sepsis with septic shock: Secondary | ICD-10-CM | POA: Diagnosis not present

## 2020-02-13 DIAGNOSIS — M6281 Muscle weakness (generalized): Secondary | ICD-10-CM | POA: Diagnosis not present

## 2020-02-13 DIAGNOSIS — I959 Hypotension, unspecified: Secondary | ICD-10-CM | POA: Diagnosis not present

## 2020-02-17 ENCOUNTER — Other Ambulatory Visit: Payer: Self-pay

## 2020-02-17 ENCOUNTER — Encounter: Payer: Self-pay | Admitting: Physician Assistant

## 2020-02-17 ENCOUNTER — Ambulatory Visit (INDEPENDENT_AMBULATORY_CARE_PROVIDER_SITE_OTHER): Payer: Medicare Other | Admitting: Physician Assistant

## 2020-02-17 ENCOUNTER — Ambulatory Visit (INDEPENDENT_AMBULATORY_CARE_PROVIDER_SITE_OTHER): Payer: Medicare Other

## 2020-02-17 VITALS — BP 104/58 | HR 64 | Ht 68.0 in | Wt 158.0 lb

## 2020-02-17 DIAGNOSIS — E785 Hyperlipidemia, unspecified: Secondary | ICD-10-CM

## 2020-02-17 DIAGNOSIS — R6521 Severe sepsis with septic shock: Secondary | ICD-10-CM | POA: Diagnosis not present

## 2020-02-17 DIAGNOSIS — M6281 Muscle weakness (generalized): Secondary | ICD-10-CM | POA: Diagnosis not present

## 2020-02-17 DIAGNOSIS — I10 Essential (primary) hypertension: Secondary | ICD-10-CM | POA: Diagnosis not present

## 2020-02-17 DIAGNOSIS — C61 Malignant neoplasm of prostate: Secondary | ICD-10-CM | POA: Diagnosis not present

## 2020-02-17 DIAGNOSIS — I480 Paroxysmal atrial fibrillation: Secondary | ICD-10-CM | POA: Diagnosis not present

## 2020-02-17 DIAGNOSIS — I25810 Atherosclerosis of coronary artery bypass graft(s) without angina pectoris: Secondary | ICD-10-CM | POA: Diagnosis not present

## 2020-02-17 DIAGNOSIS — A419 Sepsis, unspecified organism: Secondary | ICD-10-CM | POA: Diagnosis not present

## 2020-02-17 DIAGNOSIS — N179 Acute kidney failure, unspecified: Secondary | ICD-10-CM | POA: Diagnosis not present

## 2020-02-17 DIAGNOSIS — I959 Hypotension, unspecified: Secondary | ICD-10-CM | POA: Diagnosis not present

## 2020-02-17 DIAGNOSIS — M5441 Lumbago with sciatica, right side: Secondary | ICD-10-CM | POA: Diagnosis not present

## 2020-02-17 NOTE — Patient Instructions (Signed)
Medication Instructions:  Your physician recommends that you continue on your current medications as directed. Please refer to the Current Medication list given to you today.  *If you need a refill on your cardiac medications before your next appointment, please call your pharmacy*   Lab Work: None If you have labs (blood work) drawn today and your tests are completely normal, you will receive your results only by: Marland Kitchen MyChart Message (if you have MyChart) OR . A paper copy in the mail If you have any lab test that is abnormal or we need to change your treatment, we will call you to review the results.   Testing/Procedures: Your physician has recommended that you wear an event monitor. Event monitors are medical devices that record the heart's electrical activity. Doctors most often [place] these monitors to diagnose arrhythmias. Arrhythmias are problems with the speed or rhythm of the heartbeat. The monitor is a small, portable device. You can wear one while you do your normal daily activities. This is usually used to diagnose what is causing palpitations/syncope (passing out).     Follow-Up: At [facility], you and your health needs are our priority.  As part of our continuing mission to provide you with exceptional heart care, we have created designated Provider Care Teams.  These Care Teams include your primary Cardiologist (physician) and Advanced Practice Providers (APPs -  Physician Assistants and Nurse Practitioners) who all work together to provide you with the care you need, when you need it.  We recommend signing up for the patient portal called "MyChart".  Sign up information is provided on this After Visit Summary.  MyChart is used to connect with patients for Virtual Visits (Telemedicine).  Patients are able to view lab/test results, encounter notes, upcoming appointments, etc.  Non-urgent messages can be sent to your provider as well.   To learn more about what you can do with  MyChart, go to [email or web address].    Your next appointment:   6 week(s)  The format for your next appointment:   In Person  Provider:   Rozann Lesches, MD   Other Instructions Wear compression stockings as instructed.  Two Gram Sodium Diet 2000 mg  What is Sodium? Sodium is a mineral found naturally in many foods. The most significant source of sodium in the diet is table salt, which is about 40% sodium.  Processed, convenience, and preserved foods also contain a large amount of sodium.  The body needs only 500 mg of sodium daily to function,  A normal diet provides more than enough sodium even if you do not use salt.  Why Limit Sodium? A build up of sodium in the body can cause thirst, increased blood pressure, shortness of breath, and water retention.  Decreasing sodium in the diet can reduce edema and risk of heart attack or stroke associated with high blood pressure.  Keep in mind that there are many other factors involved in these health problems.  Heredity, obesity, lack of exercise, cigarette smoking, stress and what you eat all play a role.  General Guidelines:  Do not add salt at the table or in cooking.  One teaspoon of salt contains over 2 grams of sodium.  Read food labels  Avoid processed and convenience foods  Ask your dietitian before eating any foods not dicussed in the menu planning guidelines  Consult your physician if you wish to use a salt substitute or a sodium containing medication such as antacids.  Limit milk and milk products to  16 oz (2 cups) per day.  Shopping Hints:  READ LABELS!! "Dietetic" does not necessarily mean low sodium.  Salt and other sodium ingredients are often added to foods during processing.   Menu Planning Guidelines Food Group Choose More Often Avoid  Beverages (see also the milk group All fruit juices, low-sodium, salt-free vegetables juices, low-sodium carbonated beverages Regular vegetable or tomato juices, commercially  softened water used for drinking or cooking  Breads and Cereals Enriched white, wheat, rye and pumpernickel bread, hard rolls and dinner rolls; muffins, cornbread and waffles; most dry cereals, cooked cereal without added salt; unsalted crackers and breadsticks; low sodium or homemade bread crumbs Bread, rolls and crackers with salted tops; quick breads; instant hot cereals; pancakes; commercial bread stuffing; self-rising flower and biscuit mixes; regular bread crumbs or cracker crumbs  Desserts and Sweets Desserts and sweets mad with mild should be within allowance Instant pudding mixes and cake mixes  Fats Butter or margarine; vegetable oils; unsalted salad dressings, regular salad dressings limited to 1 Tbs; light, sour and heavy cream Regular salad dressings containing bacon fat, bacon bits, and salt pork; snack dips made with instant soup mixes or processed cheese; salted nuts  Fruits Most fresh, frozen and canned fruits Fruits processed with salt or sodium-containing ingredient (some dried fruits are processed with sodium sulfites        Vegetables Fresh, frozen vegetables and low- sodium canned vegetables Regular canned vegetables, sauerkraut, pickled vegetables, and others prepared in brine; frozen vegetables in sauces; vegetables seasoned with ham, bacon or salt pork  Condiments, Sauces, Miscellaneous  Salt substitute with physician's approval; pepper, herbs, spices; vinegar, lemon or lime juice; hot pepper sauce; garlic powder, onion powder, low sodium soy sauce (1 Tbs.); low sodium condiments (ketchup, chili sauce, mustard) in limited amounts (1 tsp.) fresh ground horseradish; unsalted tortilla chips, pretzels, potato chips, popcorn, salsa (1/4 cup) Any seasoning made with salt including garlic salt, celery salt, onion salt, and seasoned salt; sea salt, rock salt, kosher salt; meat tenderizers; monosodium glutamate; mustard, regular soy sauce, barbecue, sauce, chili sauce, teriyaki sauce,  steak sauce, Worcestershire sauce, and most flavored vinegars; canned gravy and mixes; regular condiments; salted snack foods, olives, picles, relish, horseradish sauce, catsup   Food preparation: Try these seasonings Meats:    Pork Sage, onion Serve with applesauce  Chicken Poultry seasoning, thyme, parsley Serve with cranberry sauce  Lamb Curry powder, rosemary, garlic, thyme Serve with mint sauce or jelly  Veal Marjoram, basil Serve with current jelly, cranberry sauce  Beef Pepper, bay leaf Serve with dry mustard, unsalted chive butter  Fish [place] leaf, dill Serve with unsalted lemon butter, unsalted parsley butter  Vegetables:    Asparagus Lemon juice   Broccoli Lemon juice   Carrots Mustard dressing parsley, mint, nutmeg, glazed with unsalted butter and sugar   Green beans Marjoram, lemon juice, nutmeg,dill seed   Tomatoes Basil, marjoram, onion   Spice /blend for [employer]" 4 tsp ground thyme 1 tsp ground sage 3 tsp ground rosemary 4 tsp ground marjoram   Test your knowledge 1. A product that says "Salt Free" may still contain sodium. True or False 2. Garlic Powder and Hot Pepper Sauce an be used as alternative seasonings.True or False 3. Processed foods have more sodium than fresh foods.  True or False 4. Canned Vegetables have less sodium than froze True or False  WAYS TO DECREASE YOUR SODIUM INTAKE 1. Avoid the use of added salt in cooking and at the table.  Table salt (and other prepared seasonings which contain salt) is probably one of the greatest sources of sodium in the diet.  Unsalted foods can gain flavor from the sweet, sour, and butter taste sensations of herbs and spices.  Instead of using salt for seasoning, try the following seasonings with the foods listed.  Remember: how you use them to enhance natural food flavors is limited only by your creativity... Allspice-Meat, fish, eggs, fruit, peas, red and yellow vegetables Almond Extract-Fruit baked goods Anise  Seed-Sweet breads, fruit, carrots, beets, cottage cheese, cookies (tastes like licorice) Basil-Meat, fish, eggs, vegetables, rice, vegetables salads, soups, sauces Bay Leaf-Meat, fish, stews, poultry Burnet-Salad, vegetables (cucumber-like flavor) Caraway Seed-Bread, cookies, cottage cheese, meat, vegetables, cheese, rice Cardamon-Baked goods, fruit, soups Celery Powder or seed-Salads, salad dressings, sauces, meatloaf, soup, bread.Do not use  celery salt Chervil-Meats, salads, fish, eggs, vegetables, cottage cheese (parsley-like flavor) Chili Power-Meatloaf, chicken cheese, corn, eggplant, egg dishes Chives-Salads cottage cheese, egg dishes, soups, vegetables, sauces Cilantro-Salsa, casseroles Cinnamon-Baked goods, fruit, pork, lamb, chicken, carrots Cloves-Fruit, baked goods, fish, pot roast, green beans, beets, carrots Coriander-Pastry, cookies, meat, salads, cheese (lemon-orange flavor) Cumin-Meatloaf, fish,cheese, eggs, cabbage,fruit pie (caraway flavor) [employer], fruit, eggs, fish, poultry, cottage cheese, vegetables Dill Seed-Meat, cottage cheese, poultry, vegetables, fish, salads, bread Fennel Seed-Bread, cookies, apples, pork, eggs, fish, beets, cabbage, cheese, Licorice-like flavor Garlic-(buds or powder) Salads, meat, poultry, fish, bread, butter, vegetables, potatoes.Do not  use garlic salt Ginger-Fruit, vegetables, baked goods, meat, fish, poultry Horseradish Root-Meet, vegetables, butter Lemon Juice or Extract-Vegetables, fruit, tea, baked goods, fish salads Mace-Baked goods fruit, vegetables, fish, poultry (taste like nutmeg) Maple Extract-Syrups Marjoram-Meat, chicken, fish, vegetables, breads, green salads (taste like Sage) Mint-Tea, lamb, sherbet, vegetables, desserts, carrots, cabbage Mustard, Dry or Seed-Cheese, eggs, meats, vegetables, poultry Nutmeg-Baked goods, fruit, chicken, eggs, vegetables, desserts Onion Powder-Meat, fish, poultry, vegetables, cheese,  eggs, bread, rice salads (Do not use   Onion salt) Orange Extract-Desserts, baked goods Oregano-Pasta, eggs, cheese, onions, pork, lamb, fish, chicken, vegetables, green salads Paprika-Meat, fish, poultry, eggs, cheese, vegetables Parsley Flakes-Butter, vegetables, meat fish, poultry, eggs, bread, salads (certain forms may   Contain sodium Pepper-Meat fish, poultry, vegetables, eggs Peppermint Extract-Desserts, baked goods Poppy Seed-Eggs, bread, cheese, fruit dressings, baked goods, noodles, vegetables, cottage  [employer], poultry, meat, fish, cauliflower, turnips,eggs bread Saffron-Rice, bread, veal, chicken, fish, eggs Sage-Meat, fish, poultry, onions, eggplant, tomateos, pork, stews Savory-Eggs, salads, poultry, meat, rice, vegetables, soups, pork Tarragon-Meat, poultry, fish, eggs, butter, vegetables (licorice-like flavor)  Thyme-Meat, poultry, fish, eggs, vegetables, (clover-like flavor), sauces, soups Tumeric-Salads, butter, eggs, fish, rice, vegetables (saffron-like flavor) Vanilla Extract-Baked goods, candy Vinegar-Salads, vegetables, meat marinades Walnut Extract-baked goods, candy  2. Choose your Foods Wisely   The following is a list of foods to avoid which are high in sodium:  Meats-Avoid all smoked, canned, salt cured, dried and kosher meat and fish as well as Anchovies   [employer] meats:Bologna, Liverwurst, Pastrami Canned meat or fish  Marinated herring Caviar    Pepperoni Corned Beef   Pizza Dried chipped beef  Salami Frozen breaded fish or meat Salt pork Frankfurters or hot dogs  Sardines Gefilte fish   Sausage Ham (boiled ham, Proscuitto Smoked butt    spiced ham)   Spam      TV Dinners Vegetables Canned vegetables (Regular) Relish Canned mushrooms  Sauerkraut Olives    Tomato juice Pickles  Bakery and Dessert Products Canned puddings  Cream pies Cheesecake   Decorated  cakes  Cookies  Beverages/Juices Tomato juice, regular  Gatorade   V-8 vegetable juice, regular  Breads and Cereals Biscuit mixes   Salted potato chips, corn chips, pretzels Bread stuffing mixes  Salted crackers and rolls Pancake and waffle mixes Self-rising flour  Seasonings Accent    Meat sauces Barbecue sauce  Meat tenderizer Catsup    Monosodium glutamate (MSG) Celery salt   Onion salt Chili sauce   Prepared mustard Garlic salt   Salt, seasoned salt, sea salt Gravy mixes   Soy sauce Horseradish   Steak sauce Ketchup   Tartar sauce Lite salt    Teriyaki sauce Marinade mixes   Worcestershire sauce  Others Baking powder   Cocoa and cocoa mixes Baking soda   Commercial casserole mixes Candy-caramels, chocolate  Dehydrated soups    Bars, fudge,nougats  Instant rice and pasta mixes Canned broth or soup  Maraschino cherries Cheese, aged and processed cheese and cheese spreads  Learning Assessment Quiz  Indicated T (for True) or F (for False) for each of the following statements:  1. _____ Fresh fruits and vegetables and unprocessed grains are generally low in sodium 2. _____ Water may contain a considerable amount of sodium, depending on the source 3. _____ You can always tell if a food is high in sodium by tasting it 4. _____ Certain laxatives my be high in sodium and should be avoided unless prescribed   by a physician or pharmacist 5. _____ Salt substitutes may be used freely by anyone on a sodium restricted diet 6. _____ Sodium is present in table salt, food additives and as a natural component of   most foods 7. _____ Table salt is approximately 90% sodium 8. _____ Limiting sodium intake may help prevent excess fluid accumulation in the body 9. _____ On a sodium-restricted diet, seasonings such as bouillon soy sauce, and    cooking wine should be used in place of table salt 10. _____ On an ingredient list, a product which lists monosodium glutamate as the first    ingredient is an appropriate food to include on a low sodium diet  Circle the best answer(s) to the following statements (Hint: there may be more than one correct answer)  11. On a low-sodium diet, some acceptable snack items are:    A. Olives  F. Bean dip   K. Grapefruit juice    B. Salted Pretzels G. Commercial Popcorn   L. Canned peaches    C. Carrot Sticks  H. Bouillon   M. Unsalted nuts   D. [place] fries  I. Peanut butter crackers N. Salami   E. Sweet pickles J. Tomato Juice   O. Pizza  12.  Seasonings that may be used freely on a reduced - sodium diet include   A. Lemon wedges F.Monosodium glutamate K. Celery seed    B.Soysauce   G. Pepper   L. Mustard powder   C. Sea salt  H. Cooking wine  M. Onion flakes   D. Vinegar  E. Prepared horseradish N. Salsa   E. Sage   J. Worcestershire sauce  O. Chutney

## 2020-02-18 DIAGNOSIS — I480 Paroxysmal atrial fibrillation: Secondary | ICD-10-CM | POA: Diagnosis not present

## 2020-02-19 DIAGNOSIS — M6281 Muscle weakness (generalized): Secondary | ICD-10-CM | POA: Diagnosis not present

## 2020-02-19 DIAGNOSIS — R6521 Severe sepsis with septic shock: Secondary | ICD-10-CM | POA: Diagnosis not present

## 2020-02-19 DIAGNOSIS — G8929 Other chronic pain: Secondary | ICD-10-CM | POA: Diagnosis not present

## 2020-02-19 DIAGNOSIS — N179 Acute kidney failure, unspecified: Secondary | ICD-10-CM | POA: Diagnosis not present

## 2020-02-19 DIAGNOSIS — I129 Hypertensive chronic kidney disease with stage 1 through stage 4 chronic kidney disease, or unspecified chronic kidney disease: Secondary | ICD-10-CM | POA: Diagnosis not present

## 2020-02-19 DIAGNOSIS — N189 Chronic kidney disease, unspecified: Secondary | ICD-10-CM | POA: Diagnosis not present

## 2020-02-19 DIAGNOSIS — E7849 Other hyperlipidemia: Secondary | ICD-10-CM | POA: Diagnosis not present

## 2020-02-19 DIAGNOSIS — M5442 Lumbago with sciatica, left side: Secondary | ICD-10-CM | POA: Diagnosis not present

## 2020-02-19 DIAGNOSIS — I959 Hypotension, unspecified: Secondary | ICD-10-CM | POA: Diagnosis not present

## 2020-02-19 DIAGNOSIS — E559 Vitamin D deficiency, unspecified: Secondary | ICD-10-CM | POA: Diagnosis not present

## 2020-02-19 DIAGNOSIS — M5441 Lumbago with sciatica, right side: Secondary | ICD-10-CM | POA: Diagnosis not present

## 2020-02-19 DIAGNOSIS — A419 Sepsis, unspecified organism: Secondary | ICD-10-CM | POA: Diagnosis not present

## 2020-02-20 DIAGNOSIS — M5441 Lumbago with sciatica, right side: Secondary | ICD-10-CM | POA: Diagnosis not present

## 2020-02-20 DIAGNOSIS — A419 Sepsis, unspecified organism: Secondary | ICD-10-CM | POA: Diagnosis not present

## 2020-02-20 DIAGNOSIS — N179 Acute kidney failure, unspecified: Secondary | ICD-10-CM | POA: Diagnosis not present

## 2020-02-20 DIAGNOSIS — M6281 Muscle weakness (generalized): Secondary | ICD-10-CM | POA: Diagnosis not present

## 2020-02-20 DIAGNOSIS — R6521 Severe sepsis with septic shock: Secondary | ICD-10-CM | POA: Diagnosis not present

## 2020-02-20 DIAGNOSIS — I959 Hypotension, unspecified: Secondary | ICD-10-CM | POA: Diagnosis not present

## 2020-02-24 ENCOUNTER — Other Ambulatory Visit: Payer: Self-pay | Admitting: Radiology

## 2020-02-24 DIAGNOSIS — M5441 Lumbago with sciatica, right side: Secondary | ICD-10-CM | POA: Diagnosis not present

## 2020-02-24 DIAGNOSIS — R6521 Severe sepsis with septic shock: Secondary | ICD-10-CM | POA: Diagnosis not present

## 2020-02-24 DIAGNOSIS — N179 Acute kidney failure, unspecified: Secondary | ICD-10-CM | POA: Diagnosis not present

## 2020-02-24 DIAGNOSIS — M6281 Muscle weakness (generalized): Secondary | ICD-10-CM | POA: Diagnosis not present

## 2020-02-24 DIAGNOSIS — I959 Hypotension, unspecified: Secondary | ICD-10-CM | POA: Diagnosis not present

## 2020-02-24 DIAGNOSIS — A419 Sepsis, unspecified organism: Secondary | ICD-10-CM | POA: Diagnosis not present

## 2020-02-25 ENCOUNTER — Other Ambulatory Visit: Payer: Self-pay

## 2020-02-25 ENCOUNTER — Ambulatory Visit (HOSPITAL_COMMUNITY)
Admission: RE | Admit: 2020-02-25 | Discharge: 2020-02-25 | Disposition: A | Discharge status: Home / Self Care | Payer: Medicare Other | Source: Ambulatory Visit | Attending: Internal Medicine | Admitting: Internal Medicine

## 2020-02-25 DIAGNOSIS — Z8546 Personal history of malignant neoplasm of prostate: Secondary | ICD-10-CM | POA: Insufficient documentation

## 2020-02-25 DIAGNOSIS — Z951 Presence of aortocoronary bypass graft: Secondary | ICD-10-CM | POA: Diagnosis not present

## 2020-02-25 DIAGNOSIS — Z7952 Long term (current) use of systemic steroids: Secondary | ICD-10-CM | POA: Diagnosis not present

## 2020-02-25 DIAGNOSIS — Z8249 Family history of ischemic heart disease and other diseases of the circulatory system: Secondary | ICD-10-CM | POA: Diagnosis not present

## 2020-02-25 DIAGNOSIS — I998 Other disorder of circulatory system: Secondary | ICD-10-CM | POA: Diagnosis not present

## 2020-02-25 DIAGNOSIS — D134 Benign neoplasm of liver: Secondary | ICD-10-CM | POA: Diagnosis not present

## 2020-02-25 DIAGNOSIS — Z91040 Latex allergy status: Secondary | ICD-10-CM | POA: Insufficient documentation

## 2020-02-25 DIAGNOSIS — Z9079 Acquired absence of other genital organ(s): Secondary | ICD-10-CM | POA: Insufficient documentation

## 2020-02-25 DIAGNOSIS — M1990 Unspecified osteoarthritis, unspecified site: Secondary | ICD-10-CM | POA: Diagnosis not present

## 2020-02-25 DIAGNOSIS — K7689 Other specified diseases of liver: Secondary | ICD-10-CM | POA: Diagnosis not present

## 2020-02-25 DIAGNOSIS — Z7901 Long term (current) use of anticoagulants: Secondary | ICD-10-CM | POA: Insufficient documentation

## 2020-02-25 DIAGNOSIS — E785 Hyperlipidemia, unspecified: Secondary | ICD-10-CM | POA: Insufficient documentation

## 2020-02-25 DIAGNOSIS — I2510 Atherosclerotic heart disease of native coronary artery without angina pectoris: Secondary | ICD-10-CM | POA: Insufficient documentation

## 2020-02-25 DIAGNOSIS — Z85830 Personal history of malignant neoplasm of bone: Secondary | ICD-10-CM | POA: Insufficient documentation

## 2020-02-25 DIAGNOSIS — Z809 Family history of malignant neoplasm, unspecified: Secondary | ICD-10-CM | POA: Diagnosis not present

## 2020-02-25 DIAGNOSIS — K7589 Other specified inflammatory liver diseases: Secondary | ICD-10-CM | POA: Diagnosis not present

## 2020-02-25 DIAGNOSIS — I10 Essential (primary) hypertension: Secondary | ICD-10-CM | POA: Insufficient documentation

## 2020-02-25 DIAGNOSIS — Z79899 Other long term (current) drug therapy: Secondary | ICD-10-CM | POA: Insufficient documentation

## 2020-02-25 DIAGNOSIS — K769 Liver disease, unspecified: Secondary | ICD-10-CM

## 2020-02-25 DIAGNOSIS — K759 Inflammatory liver disease, unspecified: Secondary | ICD-10-CM | POA: Diagnosis not present

## 2020-02-25 LAB — CBC
HCT: 36.2 % — ABNORMAL LOW (ref 39.0–52.0)
Hemoglobin: 11.3 g/dL — ABNORMAL LOW (ref 13.0–17.0)
MCH: 30.3 pg (ref 26.0–34.0)
MCHC: 31.2 g/dL (ref 30.0–36.0)
MCV: 97.1 fL (ref 80.0–100.0)
Platelets: 229 10*3/uL (ref 150–400)
RBC: 3.73 MIL/uL — ABNORMAL LOW (ref 4.22–5.81)
RDW: 15.1 % (ref 11.5–15.5)
WBC: 11.7 10*3/uL — ABNORMAL HIGH (ref 4.0–10.5)
nRBC: 0 % (ref 0.0–0.2)

## 2020-02-25 LAB — PROTIME-INR
INR: 1 (ref 0.8–1.2)
Prothrombin Time: 12.7 seconds (ref 11.4–15.2)

## 2020-02-25 MED ORDER — MIDAZOLAM HCL 2 MG/2ML IJ SOLN
INTRAMUSCULAR | Status: AC
  Filled 2020-02-25: qty 2

## 2020-02-25 MED ORDER — HYDROCODONE-ACETAMINOPHEN 5-325 MG PO TABS
1.0000 | ORAL_TABLET | ORAL | Status: DC | PRN
  Administered 2020-02-25: 2 via ORAL
  Filled 2020-02-25: qty 2

## 2020-02-25 MED ORDER — SODIUM CHLORIDE 0.9 % IV SOLN: INTRAVENOUS | Status: DC

## 2020-02-25 MED ORDER — GELATIN ABSORBABLE 12-7 MM EX MISC
CUTANEOUS | Status: AC
  Filled 2020-02-25: qty 1

## 2020-02-25 MED ORDER — MIDAZOLAM HCL 2 MG/2ML IJ SOLN
INTRAMUSCULAR | Status: AC | PRN
  Administered 2020-02-25: 1 mg via INTRAVENOUS

## 2020-02-25 MED ORDER — FENTANYL CITRATE (PF) 100 MCG/2ML IJ SOLN
INTRAMUSCULAR | Status: AC | PRN
  Administered 2020-02-25: 50 ug via INTRAVENOUS

## 2020-02-25 MED ORDER — LIDOCAINE HCL (PF) 1 % IJ SOLN
INTRAMUSCULAR | Status: AC
  Filled 2020-02-25: qty 30

## 2020-02-25 MED ORDER — FENTANYL CITRATE (PF) 100 MCG/2ML IJ SOLN
INTRAMUSCULAR | Status: AC
  Filled 2020-02-25: qty 2

## 2020-02-25 NOTE — Discharge Instructions (Signed)

## 2020-02-25 NOTE — Procedures (Signed)
  Procedure: [place] core liver lesion    EBL:   minimal Complications:  none immediate  See full dictation in [facility].  Dillard Cannon MD Main # [phone] Pager  [phone]

## 2020-02-25 NOTE — Consult Note (Signed)
Chief Complaint: Liver lesion. Request is for liver biopsy  Referring Physician(s): Dr. Dutch Quint  Supervising Physician: Arne Cleveland  Patient Status: [facility] - Out-pt  History of Present Illness: Samuel Gregory is a 79 y.o. male History of prostate cancer with metastasis to the bone, HTN, CAD s/p CABG in 2000, new onset of a flutter. Recently admitted for acute diverticulitis and sepsis. CT renal stone study from 6.22.21 shows incidental finding of a liver lesion. Follow up CT abd pelvis from 7.6.21 reads a hypodense mass in the inferior tip of the right lobe of the liver in segment 6.  Patient originally scheduled biopsy was reschedule due to an intermediate admission for c diff. Team is requesting liver biopsy for further determination of liver lesion.  Past Medical History:  Diagnosis Date  . Arthritis   . Collagen vascular disease ([place])   . Coronary atherosclerosis of native coronary artery    Multivessel status post CABG 2000  . Essential hypertension   . Hyperlipidemia   . Prostate cancer [facility])     Past Surgical History:  Procedure Laterality Date  . APPENDECTOMY     8 yrs ago- Dr Geroge Baseman  . CATARACT EXTRACTION W/PHACO Left 07/14/2014   Procedure: CATARACT EXTRACTION PHACO AND INTRAOCULAR LENS PLACEMENT (IOC);  Surgeon: Tonny Branch, MD;  Location: [facility] ORS;  Service: Ophthalmology;  Laterality: Left;  CDE:6.20  . CATARACT EXTRACTION W/PHACO Right 08/11/2014   Procedure: CATARACT EXTRACTION PHACO AND INTRAOCULAR LENS PLACEMENT RIGHT EYE;  Surgeon: Tonny Branch, MD;  Location: [facility] ORS;  Service: Ophthalmology;  Laterality: Right;  CDE:10.11  . COLONOSCOPY N/A 01/14/2015   Procedure: COLONOSCOPY;  Surgeon: Rogene Houston, MD;  Location: [facility] ENDO SUITE;  Service: Endoscopy;  Laterality: N/A;  730  . CORONARY ARTERY BYPASS GRAFT  01/1999   LIMA to LAD, SVG to diagonal, sequential SVG to PDA and PLB  . PROST[place]CTOMY  2004   [name]  . TONSILLECTOMY     age 22     Allergies: Latex and Sulfa antibiotics  Medications: Prior to Admission medications   Medication Sig Start Date End Date Taking? Authorizing Provider  acetaminophen (TYLENOL) 325 MG tablet Take 2 tablets (650 mg total) by mouth every 6 (six) hours as needed for mild pain, fever or headache (or Fever >/= 101). 02/06/20  Yes Emokpae, Courage, MD  apixaban (ELIQUIS) 5 MG TABS tablet Take 1 tablet (5 mg total) by mouth 2 (two) times daily. 01/27/20 03/27/20 Yes Johnson, Clanford L, MD  Calcium-Magnesium-Vitamin D (CALCIUM 1200+D3 PO) Take 1 tablet by mouth daily.    Yes [provider]  cholecalciferol (VITAMIN D3) 25 MCG (1000 UNIT) tablet Take 1,000 Units by mouth daily.   Yes [provider]  fluticasone (FLONASE) 50 MCG/ACT nasal spray Place 1 spray into both nostrils daily as needed for allergies or rhinitis.   Yes [provider]  metoprolol tartrate 75 MG TABS Take 75 mg by mouth 2 (two) times daily. 02/06/20  Yes Emokpae, Courage, MD  Multiple Vitamin (MULTIVITAMIN WITH MINERALS) TABS Take 1 tablet by mouth daily.   Yes [provider]  nitroGLYCERIN (NITROSTAT) 0.4 MG SL tablet Place 0.4 mg under the tongue every 5 (five) minutes as needed for chest pain.   Yes [provider]  pantoprazole (PROTONIX) 40 MG tablet Take 40 mg by mouth daily as needed (for acid reflux).  12/27/19  Yes [provider]  predniSONE (DELTASONE) 5 MG tablet Take 5 mg by mouth 2 (two)  times daily.  09/20/16  Yes [provider]  rosuvastatin (CRESTOR) 40 MG tablet Take 1 tablet (40 mg total) by mouth daily after supper. 11/29/19 02/27/20 Yes Satira Sark, MD  Thiamine HCl (VITAMIN B-1 PO) Take 1 tablet by mouth every morning.    Yes [provider]  ZYTIGA 250 MG tablet Take 4 tablets by mouth every morning.  09/20/16  Yes [provider]     Family History  Problem Relation Age of Onset  . Heart attack Mother   . Cancer Mother    . Aneurysm Father     Social History   Socioeconomic History  . Marital status: Married    Spouse name: Not on file  . Number of children: Not on file  . Years of education: Not on file  . Highest education level: Not on file  Occupational History  . Not on file  Tobacco Use  . Smoking status: Never Smoker  . Smokeless tobacco: Never Used  Vaping Use  . Vaping Use: Never used  Substance and Sexual Activity  . Alcohol use: Yes    Alcohol/week: 0.0 standard drinks    Comment: daily-glass of wine  . Drug use: No  . Sexual activity: Not on file  Other Topics Concern  . Not on file  Social History Narrative  . Not on file   Social Determinants of Health   Financial Resource Strain:   . Difficulty of Paying Living Expenses:   Food Insecurity:   . Worried About Charity fundraiser in the Last Year:   . Arboriculturist in the Last Year:   Transportation Needs:   . Film/video editor (Medical):   Marland Kitchen Lack of Transportation (Non-Medical):   Physical Activity:   . Days of Exercise per Week:   . Minutes of Exercise per Session:   Stress:   . Feeling of Stress :   Social Connections:   . Frequency of Communication with Friends and Family:   . Frequency of Social Gatherings with Friends and Family:   . Attends Religious Services:   . Active Member of Clubs or Organizations:   . Attends Archivist Meetings:   Marland Kitchen Marital Status:     Review of Systems: A 12 point ROS discussed and pertinent positives are indicated in the HPI above.  All other systems are negative.  Review of Systems  Constitutional: Negative for fever.  HENT: Negative for congestion.   Respiratory: Negative for cough and shortness of breath.   Cardiovascular: Negative for chest pain.  Gastrointestinal: Negative for abdominal pain.  Musculoskeletal: Positive for back pain ( baseline. ).  Neurological: Negative for headaches.  Psychiatric/Behavioral: Negative for behavioral problems and  confusion.    Vital Signs: BP (!) 148/74   Pulse 70   Temp 98.5 F (36.9 C) (Oral)   Resp 16   Ht 5\' 6"  (1.676 m)   Wt 159 lb (72.1 kg)   SpO2 99%   BMI 25.66 kg/m   Physical Exam Vitals and nursing note reviewed.  Constitutional:      Appearance: He is well-developed.  HENT:     Head: Normocephalic.  Cardiovascular:     Rate and Rhythm: Normal rate and regular rhythm.     Heart sounds: Normal heart sounds.  Pulmonary:     Effort: Pulmonary effort is normal.     Breath sounds: Normal breath sounds.  Musculoskeletal:        General: Normal range of motion.  Cervical back: Normal range of motion.  Skin:    General: Skin is dry.  Neurological:     Mental Status: He is alert and oriented to person, place, and time.     Imaging: DG Chest 2 View  Result Date: 02/04/2020 CLINICAL DATA:  Suspected sepsis EXAM: CHEST - 2 VIEW COMPARISON:  02/03/2020 FINDINGS: No new consolidation or edema. No pleural effusion or pneumothorax. Stable cardiomediastinal contours with normal heart size. No acute osseous abnormality. IMPRESSION: No acute process in the chest. Electronically Signed   By: Macy Mis M.D.   On: 02/04/2020 13:53   DG Chest 2 View  Result Date: 02/03/2020 CLINICAL DATA:  Fever EXAM: CHEST - 2 VIEW COMPARISON:  January 23, 2020 FINDINGS: The heart size and mediastinal contours are stable. Both lungs are clear. The visualized skeletal structures are unremarkable. IMPRESSION: No active cardiopulmonary disease. Electronically Signed   By: Abelardo Diesel M.D.   On: 02/03/2020 10:47   CT ABDOMEN PELVIS W CONTRAST  Result Date: 02/04/2020 CLINICAL DATA:  Liver mass, abdominal pain EXAM: CT ABDOMEN AND PELVIS WITH CONTRAST TECHNIQUE: Multidetector CT imaging of the abdomen and pelvis was performed using the standard protocol following bolus administration of intravenous contrast. CONTRAST:  112mL OMNIPAQUE IOHEXOL 300 MG/ML  SOLN COMPARISON:  MR abdomen pelvis, 01/21/2020,  noncontrast CT abdomen pelvis, 01/21/2020 FINDINGS: Lower chest: No acute abnormality. Three-vessel coronary artery calcifications. Hepatobiliary: Redemonstrated peripherally enhancing hypodense mass of the inferior tip of the right lobe of the liver, hepatic segment VI, measuring approximately 2.5 cm on this examination, better characterized by prior multiphasic contrast enhanced MRI. Gallstones in the dependent gallbladder. No gallbladder wall thickening, or biliary dilatation. Pancreas: Redemonstrated low-attenuation lesion of the pancreatic head adjacent to the common bile duct measuring 1.5 cm, again better characterized by prior multiphasic MRI. No pancreatic ductal dilatation or surrounding inflammatory changes. Spleen: Normal in size without significant abnormality. Adrenals/Urinary Tract: Adrenal glands are unremarkable. Kidneys are normal, without renal calculi, solid lesion, or hydronephrosis. Bladder is unremarkable. Stomach/Bowel: Stomach is within normal limits. Status post appendectomy. Occasional sigmoid diverticula, with wall thickening and fat stranding about a prominent diverticulum of the mid sigmoid (series 2, image 92). Vascular/Lymphatic: Aortic atherosclerosis. No enlarged abdominal or pelvic lymph nodes. Reproductive: Status post prostatectomy. Other: No abdominal wall hernia or abnormality. No abdominopelvic ascites. Musculoskeletal: No acute or significant osseous findings. IMPRESSION: 1. Occasional sigmoid diverticula, with wall thickening and fat stranding about a prominent diverticulum of the mid sigmoid, consistent with acute diverticulitis. No evidence of perforation or abscess. 2. Redemonstrated peripherally enhancing hypodense mass of the inferior tip of the right lobe of the liver, hepatic segment VI, measuring approximately 2.5 cm on this examination, better characterized by prior multiphasic contrast enhanced MRI. 3. Redemonstrated low-attenuation lesion of the pancreatic head  adjacent to the common bile duct measuring 1.5 cm, again better characterized by prior multiphasic MRI. 4. Status post prostatectomy. 5. Coronary artery disease.  Aortic Atherosclerosis (ICD10-I70.0). Electronically Signed   By: Eddie Candle M.D.   On: 02/04/2020 16:40    Labs:  CBC: Recent Labs    02/04/20 1331 02/05/20 0413 02/06/20 0340 02/25/20 1151  WBC 8.8 6.3 6.2 11.7*  HGB 11.5* 9.7* 9.6* 11.3*  HCT 36.0* 30.3* 30.3* 36.2*  PLT 357 305 303 229    COAGS: Recent Labs    01/22/20 0430 02/04/20 1331 02/25/20 1151  INR 1.2 1.2 1.0  APTT 36  --   --     BMP: Recent Labs  01/27/20 0503 02/03/20 1003 02/04/20 1331 02/05/20 0413  NA 139 137 131* 133*  K 4.0 3.8 3.6 3.0*  CL 104 100 97* 103  CO2 25 26 24 23   GLUCOSE 93 97 98 155*  BUN 28* 24* 21 18  CALCIUM 8.7* 9.6 9.0 8.0*  CREATININE 1.33* 1.32* 1.43* 1.29*  GFRNONAA 51* 51* 47* 53*  GFRAA 59* 59* 54* >60    LIVER FUNCTION TESTS: Recent Labs    01/27/20 0503 02/03/20 1003 02/04/20 1331 02/05/20 0413  BILITOT 0.5 0.8 1.1 0.7  AST 61* 27 26 18   ALT 92* 38 33 24  ALKPHOS 82 77 78 60  PROT 5.5* 6.4* 6.3* 4.8*  ALBUMIN 2.5* 3.1* 3.0* 2.2*    Assessment and Plan:  79 y.o, male outpatient. History of prostate cancer with metastasis to the bone, HTN, CAD s/p CABG in 2000, new onset of a flutter. Recently admitted for acute diverticulitis, sepsis. CT renal stone study from 6.22.21 shows incidental finding of a liver lesion. Follow up CT abd pelvis from 7.6.21 reads a hypodense mass in the inferior tip of the right lobe of the liver in segment 6.  Patient originally scheduled biopsy was reschedule due to an intermediate admission for c diff. Team is requesting liver biopsy for further determination of liver lesion.  Patient is allergic to latex. WBC is 11.7. Patient is on prednisone. All other labs are within acceptable parameters. patient is on eliquis which has been held X 2 days.  Patient is  afebrile.  Risks and benefits of liver biopsy was discussed with the patient and/or patient's family including, but not limited to bleeding, infection, damage to adjacent structures or low yield requiring additional tests.  All of the questions were answered and there is agreement to proceed.  Consent signed and in chart.    Thank you for this interesting consult.  I greatly enjoyed meeting Samuel Gregory and look forward to participating in their care.  A copy of this report was sent to the requesting provider on this date.  Electronically Signed: Jacqualine Mau, NP 02/25/2020, 12:25 PM   I spent a total of  30 Minutes   in face to face in clinical consultation, greater than 50% of which was counseling/coordinating care for liver biopsy

## 2020-02-26 LAB — SURGICAL PATHOLOGY

## 2020-02-27 DIAGNOSIS — I959 Hypotension, unspecified: Secondary | ICD-10-CM | POA: Diagnosis not present

## 2020-02-27 DIAGNOSIS — Z7901 Long term (current) use of anticoagulants: Secondary | ICD-10-CM | POA: Diagnosis not present

## 2020-02-27 DIAGNOSIS — G8929 Other chronic pain: Secondary | ICD-10-CM | POA: Diagnosis not present

## 2020-02-27 DIAGNOSIS — Z961 Presence of intraocular lens: Secondary | ICD-10-CM | POA: Diagnosis not present

## 2020-02-27 DIAGNOSIS — Z951 Presence of aortocoronary bypass graft: Secondary | ICD-10-CM | POA: Diagnosis not present

## 2020-02-27 DIAGNOSIS — N1832 Chronic kidney disease, stage 3b: Secondary | ICD-10-CM | POA: Diagnosis not present

## 2020-02-27 DIAGNOSIS — Z7951 Long term (current) use of inhaled steroids: Secondary | ICD-10-CM | POA: Diagnosis not present

## 2020-02-27 DIAGNOSIS — M5442 Lumbago with sciatica, left side: Secondary | ICD-10-CM | POA: Diagnosis not present

## 2020-02-27 DIAGNOSIS — M6281 Muscle weakness (generalized): Secondary | ICD-10-CM | POA: Diagnosis not present

## 2020-02-27 DIAGNOSIS — Z9181 History of falling: Secondary | ICD-10-CM | POA: Diagnosis not present

## 2020-02-27 DIAGNOSIS — I2510 Atherosclerotic heart disease of native coronary artery without angina pectoris: Secondary | ICD-10-CM | POA: Diagnosis not present

## 2020-02-27 DIAGNOSIS — C7951 Secondary malignant neoplasm of bone: Secondary | ICD-10-CM | POA: Diagnosis not present

## 2020-02-27 DIAGNOSIS — C61 Malignant neoplasm of prostate: Secondary | ICD-10-CM | POA: Diagnosis not present

## 2020-02-27 DIAGNOSIS — M5441 Lumbago with sciatica, right side: Secondary | ICD-10-CM | POA: Diagnosis not present

## 2020-02-27 DIAGNOSIS — M1990 Unspecified osteoarthritis, unspecified site: Secondary | ICD-10-CM | POA: Diagnosis not present

## 2020-02-27 DIAGNOSIS — Z7952 Long term (current) use of systemic steroids: Secondary | ICD-10-CM | POA: Diagnosis not present

## 2020-02-27 DIAGNOSIS — R160 Hepatomegaly, not elsewhere classified: Secondary | ICD-10-CM | POA: Diagnosis not present

## 2020-02-27 DIAGNOSIS — I4892 Unspecified atrial flutter: Secondary | ICD-10-CM | POA: Diagnosis not present

## 2020-02-27 DIAGNOSIS — K869 Disease of pancreas, unspecified: Secondary | ICD-10-CM | POA: Diagnosis not present

## 2020-02-27 DIAGNOSIS — K5792 Diverticulitis of intestine, part unspecified, without perforation or abscess without bleeding: Secondary | ICD-10-CM | POA: Diagnosis not present

## 2020-02-27 DIAGNOSIS — I129 Hypertensive chronic kidney disease with stage 1 through stage 4 chronic kidney disease, or unspecified chronic kidney disease: Secondary | ICD-10-CM | POA: Diagnosis not present

## 2020-02-27 DIAGNOSIS — M359 Systemic involvement of connective tissue, unspecified: Secondary | ICD-10-CM | POA: Diagnosis not present

## 2020-02-27 DIAGNOSIS — E785 Hyperlipidemia, unspecified: Secondary | ICD-10-CM | POA: Diagnosis not present

## 2020-02-27 DIAGNOSIS — Z79891 Long term (current) use of opiate analgesic: Secondary | ICD-10-CM | POA: Diagnosis not present

## 2020-03-01 LAB — AEROBIC/ANAEROBIC CULTURE W GRAM STAIN (SURGICAL/DEEP WOUND): Culture: NO GROWTH

## 2020-03-02 ENCOUNTER — Encounter (INDEPENDENT_AMBULATORY_CARE_PROVIDER_SITE_OTHER): Payer: Self-pay | Admitting: Gastroenterology

## 2020-03-02 ENCOUNTER — Ambulatory Visit (INDEPENDENT_AMBULATORY_CARE_PROVIDER_SITE_OTHER): Payer: Medicare Other | Admitting: Gastroenterology

## 2020-03-02 ENCOUNTER — Other Ambulatory Visit: Payer: Self-pay

## 2020-03-02 VITALS — BP 144/79 | HR 76 | Temp 97.9°F | Ht 66.0 in | Wt 157.3 lb

## 2020-03-02 DIAGNOSIS — D134 Benign neoplasm of liver: Secondary | ICD-10-CM | POA: Insufficient documentation

## 2020-03-02 DIAGNOSIS — K869 Disease of pancreas, unspecified: Secondary | ICD-10-CM

## 2020-03-02 DIAGNOSIS — A0472 Enterocolitis due to Clostridium difficile, not specified as recurrent: Secondary | ICD-10-CM

## 2020-03-02 DIAGNOSIS — K219 Gastro-esophageal reflux disease without esophagitis: Secondary | ICD-10-CM | POA: Diagnosis not present

## 2020-03-02 NOTE — Progress Notes (Signed)
[name], M.D. Gastroenterology & Hepatology [facility] [address] [place], [place] [address] Primary Care Physician: Celene Squibb, MD [place] [address]  Referring MD: Roxan Hockey, MD  I will communicate my assessment and recommendations to the referring MD via EMR. Note: Occasional unusual wording and randomly placed punctuation marks may result from the use of speech recognition technology to transcribe this document"  Chief Complaint: Hospital discharge evaluation after episode of diverticulitis.  History of Present Illness: Samuel Gregory is a 79 y.o. male with past medical history of atrial fibrillation on Eliquis, coronary artery disease status post CABG in 2000, hypertension, hyperlipidemia, GERD, HLD, metastatic prostate cancer, who presents for evaluation after recent hospitalization for episode of C. difficile colitis.  The patient came to the hospital on 02/04/2020 after presenting new episodes of fever and chills.  He came a day before to the ER due to similar symptoms and had blood testing that only showed lactic acid of 2 then down trended to 1.  Urinalysis was negative.  However, upon revisit to the ER, he underwent a CT of the abdomen with IV contrast which showed sigmoid diverticula with wall thickening and stranding which was initially considered to be secondary to acute diverticulitis.  CBC and CMP were unremarkable for a creatinine 1.4.  He was initially started on ciprofloxacin and metronidazole.  He was subsequently found to have positive C. difficile and stool, for which he was switched to vancomycin 125 mg every day as it was uncomplicated C. difficile infection.  Patient was discharged home on this antibiotic with a total course of 10 days.  Notably, the patient had a previous hospitalization in June 2021 due to streptococcal sepsis for which he received broad-spectrum  antibiotics.  Notably, the patient was found to have a hypodense subcapsular mass in the inferior tip of the right lobe of the liver in the segment VI close to 3.3x 2.5 cm.  This alteration was ordered to undergo core biopsy which he underwent on 02/25/2020. Results from pathology showed an inflammatory pseudotumor but no presence of malignancy was evidence in the specimen.  Also, the patient was also found to have a lesion in the pancreatic head which was measuring 1.5 cm in the CT scan performed in 02/04/2020.  There was no pancreatic ductal dilation or inflammatory changes surrounding the lesion.  This alteration was also observed in a MRCP performed on 01/21/2020.  Also, a previous CT abdomen with IV contrast on 03/08/2016 showed this alteration but at this time it had a size of 8 mm.  Per report, it was considered initially to be a pseudocyst.  Patient reports feeling better after receiving the antibiotic.  Since then he has been asymptomatic. Has 1-3 BMs per day without blood or melena. Normal formed stool.  Denies having any complaint at the moment.  He has a longstanding history of GERD, which is intermittent in frequency. States he takes pantoprazole as needed for heartburn which is occasional, less than 2 times per month.  Does not have any nocturnal symptoms. The patient denies having any nausea, vomiting, fever, chills, hematochezia, melena, hematemesis, abdominal distention, abdominal pain, diarrhea, jaundice, pruritus or weight loss.  Last EGD: never Last Colonoscopy: 2016 - Small polyp ablated via cold biopsy from cecum -hyperplastic polyp. Diverticulosis. Hemorrhoids.   FHx: neg for any gastrointestinal/liver disease, sister bladder cancer, brother and uncles x2 prostate cancer, mother mesothelioma Social: neg smoking, no illicit drug  use. Drinks 2 glasses of wine or vodka daily Surgical: appendectomy  Past Medical History: Past Medical History:  Diagnosis Date  . Arthritis   . Collagen  vascular disease ([place])   . Coronary atherosclerosis of native coronary artery    Multivessel status post CABG 2000  . Essential hypertension   . Hyperlipidemia   . Prostate cancer [facility])     Past Surgical History: Past Surgical History:  Procedure Laterality Date  . APPENDECTOMY     8 yrs ago- Dr Geroge Baseman  . CATARACT EXTRACTION W/PHACO Left 07/14/2014   Procedure: CATARACT EXTRACTION PHACO AND INTRAOCULAR LENS PLACEMENT (IOC);  Surgeon: Tonny Branch, MD;  Location: [facility] ORS;  Service: Ophthalmology;  Laterality: Left;  CDE:6.20  . CATARACT EXTRACTION W/PHACO Right 08/11/2014   Procedure: CATARACT EXTRACTION PHACO AND INTRAOCULAR LENS PLACEMENT RIGHT EYE;  Surgeon: Tonny Branch, MD;  Location: [facility] ORS;  Service: Ophthalmology;  Laterality: Right;  CDE:10.11  . COLONOSCOPY N/A 01/14/2015   Procedure: COLONOSCOPY;  Surgeon: Rogene Houston, MD;  Location: [facility] ENDO SUITE;  Service: Endoscopy;  Laterality: N/A;  730  . CORONARY ARTERY BYPASS GRAFT  01/1999   LIMA to LAD, SVG to diagonal, sequential SVG to PDA and PLB  . PROST[place]CTOMY  2004   [name]  . TONSILLECTOMY     age 67    Family History: Family History  Problem Relation Age of Onset  . Heart attack Mother   . Cancer Mother   . Aneurysm Father     Social History: Social History   Tobacco Use  Smoking Status Never Smoker  Smokeless Tobacco Never Used   Social History   Substance and Sexual Activity  Alcohol Use Yes  . Alcohol/week: 0.0 standard drinks   Comment: daily-glass of wine   Social History   Substance and Sexual Activity  Drug Use No    Allergies: Allergies  Allergen Reactions  . Latex Rash  . Sulfa Antibiotics Rash    Medications: Current Outpatient Medications  Medication Sig Dispense Refill  . acetaminophen (TYLENOL) 325 MG tablet Take 2 tablets (650 mg total) by mouth every 6 (six) hours as needed for mild pain, fever or headache (or Fever >/= 101). 12 tablet 1  . apixaban (ELIQUIS) 5 MG  TABS tablet Take 1 tablet (5 mg total) by mouth 2 (two) times daily. 60 tablet 1  . Calcium-Magnesium-Vitamin D (CALCIUM 1200+D3 PO) Take 1 tablet by mouth daily.     . cholecalciferol (VITAMIN D3) 25 MCG (1000 UNIT) tablet Take 5,000 Units by mouth daily.     . fluticasone (FLONASE) 50 MCG/ACT nasal spray Place 1 spray into both nostrils daily as needed for allergies or rhinitis.    . metoprolol tartrate (LOPRESSOR) 25 MG tablet Take 75 mg by mouth 2 (two) times daily.    . Multiple Vitamin (MULTIVITAMIN WITH MINERALS) TABS Take 1 tablet by mouth daily.    . nitroGLYCERIN (NITROSTAT) 0.4 MG SL tablet Place 0.4 mg under the tongue every 5 (five) minutes as needed for chest pain.    . pantoprazole (PROTONIX) 40 MG tablet Take 40 mg by mouth daily as needed (for acid reflux).     . predniSONE (DELTASONE) 5 MG tablet Take 5 mg by mouth 2 (two) times daily.   11  . rosuvastatin (CRESTOR) 40 MG tablet Take 1 tablet (40 mg total) by mouth daily after supper. 90 tablet 3  . Thiamine HCl (VITAMIN B-1 PO) Take 1 tablet by mouth every morning.     Marland Kitchen  ZYTIGA 250 MG tablet Take 4 tablets by mouth every morning.   11   No current facility-administered medications for this visit.    Review of Systems: GENERAL: negative for malaise, night sweats HEENT: No changes in hearing or vision, no nose bleeds or other nasal problems. NECK: Negative for lumps, goiter, pain and significant neck swelling RESPIRATORY: Negative for cough, wheezing CARDIOVASCULAR: Negative for chest pain, leg swelling, palpitations, orthopnea GI: SEE HPI MUSCULOSKELETAL: Negative for joint pain or swelling, back pain, and muscle pain. SKIN: Negative for lesions, rash PSYCH: Negative for sleep disturbance, mood disorder and recent psychosocial stressors. HEMATOLOGY Negative for prolonged bleeding, bruising easily, and swollen nodes. ENDOCRINE: Negative for cold or heat intolerance, polyuria, polydipsia and goiter. NEURO: negative for  tremor, gait imbalance, syncope and seizures. The remainder of the review of systems is noncontributory.   Physical Exam: BP (!) 144/79 (BP Location: Right Arm, Patient Position: Sitting, Cuff Size: Normal)   Pulse 76   Temp 97.9 F (36.6 C) (Oral)   Ht 5\' 6"  (1.676 m)   Wt 157 lb 4.8 oz (71.4 kg)   BMI 25.39 kg/m  GENERAL: The patient is AO x3, in no acute distress. Uses a cane. HEENT: Head is normocephalic and atraumatic. EOMI are intact. Mouth is well hydrated and without lesions. NECK: Supple. No masses LUNGS: Clear to auscultation. No presence of rhonchi/wheezing/rales. Adequate chest expansion HEART: RRR, normal s1 and s2. ABDOMEN: Soft, nontender, no guarding, no peritoneal signs, and nondistended. BS +. No masses. EXTREMITIES: Without any cyanosis, clubbing, rash, lesions or edema. NEUROLOGIC: AOx3, no focal motor deficit. SKIN: no jaundice, no rashes   Imaging/Labs: as above  I personally reviewed and interpreted the available labs, imaging and endoscopic files.  Impression and Plan: CALISTRO RAUF is a 79 y.o. male with past medical history of atrial fibrillation on Eliquis, coronary artery disease status post CABG in 2000, hypertension, hyperlipidemia, GERD, HLD, metastatic prostate cancer, who presents for evaluation after recent hospitalization for episode of C. difficile colitis.  The patient had resolution of his symptoms after receiving a 10-day course of vancomycin for uncomplicated C. difficile infection.  His bowel movements have completely normalized.  He does not need any repeat colonoscopy as he had one 5 years ago that did not show any masses or alterations in his sigmoid concerning for malignancy.  On the other hand, the patient was found to have incidentally a liver lesion which was biopsied and was consistent with a cellular inflammatory tumor but no presence of malignancy was found.  I will discuss this findings with the pathologist.  Finally, the patient had a  history of a pancreatic lesion which has increased in size by 7 mm in the last 4 years, which I consider will be important to investigate.  Given the the patient has not presented any alteration such as diabetes or progressive weight loss, the increase in the size warrants further investigation with an EUS.  No other abnormal findings such as a nodule are present.  However after having a thorough discussion with the patient, he would like to address his chronic back pain first before proceeding with further investigations.  I consider this is adequate.  The patient will give [place] a call back once he is ready to pursue this route.  -Advance diet as tolerated -Will discuss with pathologist results of liver biopsy -Once you have made the decision to evaluate the alteration in your pancreas, call us back to refer you for an EUS  All questions were answered.      [name], MD Gastroenterology and Hepatology [facility]

## 2020-03-02 NOTE — Patient Instructions (Signed)
Advance diet as tolerated Once you have made the decision to evaluate the alteration in your pancreas, call us back to refer you for an EUS

## 2020-03-03 DIAGNOSIS — C61 Malignant neoplasm of prostate: Secondary | ICD-10-CM | POA: Diagnosis not present

## 2020-03-03 DIAGNOSIS — I2510 Atherosclerotic heart disease of native coronary artery without angina pectoris: Secondary | ICD-10-CM | POA: Diagnosis not present

## 2020-03-03 DIAGNOSIS — K5792 Diverticulitis of intestine, part unspecified, without perforation or abscess without bleeding: Secondary | ICD-10-CM | POA: Diagnosis not present

## 2020-03-03 DIAGNOSIS — I129 Hypertensive chronic kidney disease with stage 1 through stage 4 chronic kidney disease, or unspecified chronic kidney disease: Secondary | ICD-10-CM | POA: Diagnosis not present

## 2020-03-03 DIAGNOSIS — N1832 Chronic kidney disease, stage 3b: Secondary | ICD-10-CM | POA: Diagnosis not present

## 2020-03-03 DIAGNOSIS — C7951 Secondary malignant neoplasm of bone: Secondary | ICD-10-CM | POA: Diagnosis not present

## 2020-03-06 ENCOUNTER — Other Ambulatory Visit: Payer: Self-pay

## 2020-03-06 DIAGNOSIS — C7951 Secondary malignant neoplasm of bone: Secondary | ICD-10-CM | POA: Diagnosis not present

## 2020-03-06 DIAGNOSIS — K5792 Diverticulitis of intestine, part unspecified, without perforation or abscess without bleeding: Secondary | ICD-10-CM | POA: Diagnosis not present

## 2020-03-06 DIAGNOSIS — I2510 Atherosclerotic heart disease of native coronary artery without angina pectoris: Secondary | ICD-10-CM | POA: Diagnosis not present

## 2020-03-06 DIAGNOSIS — I129 Hypertensive chronic kidney disease with stage 1 through stage 4 chronic kidney disease, or unspecified chronic kidney disease: Secondary | ICD-10-CM | POA: Diagnosis not present

## 2020-03-06 DIAGNOSIS — C61 Malignant neoplasm of prostate: Secondary | ICD-10-CM | POA: Diagnosis not present

## 2020-03-06 DIAGNOSIS — N1832 Chronic kidney disease, stage 3b: Secondary | ICD-10-CM | POA: Diagnosis not present

## 2020-03-10 DIAGNOSIS — K5792 Diverticulitis of intestine, part unspecified, without perforation or abscess without bleeding: Secondary | ICD-10-CM | POA: Diagnosis not present

## 2020-03-10 DIAGNOSIS — I129 Hypertensive chronic kidney disease with stage 1 through stage 4 chronic kidney disease, or unspecified chronic kidney disease: Secondary | ICD-10-CM | POA: Diagnosis not present

## 2020-03-10 DIAGNOSIS — C61 Malignant neoplasm of prostate: Secondary | ICD-10-CM | POA: Diagnosis not present

## 2020-03-10 DIAGNOSIS — I2510 Atherosclerotic heart disease of native coronary artery without angina pectoris: Secondary | ICD-10-CM | POA: Diagnosis not present

## 2020-03-10 DIAGNOSIS — C7951 Secondary malignant neoplasm of bone: Secondary | ICD-10-CM | POA: Diagnosis not present

## 2020-03-10 DIAGNOSIS — N1832 Chronic kidney disease, stage 3b: Secondary | ICD-10-CM | POA: Diagnosis not present

## 2020-03-11 ENCOUNTER — Telehealth (INDEPENDENT_AMBULATORY_CARE_PROVIDER_SITE_OTHER): Payer: Self-pay | Admitting: Gastroenterology

## 2020-03-11 NOTE — Telephone Encounter (Signed)
I spoke with the patient today regarding a discussion I had with Dr. Vic Ripper, who read his liver biopsy pathology specimen.  I explained to him that Dr. Vic Ripper discussed the case with 2 other pathologist and all of them were in agreement that there was no concern for malignancy in the slides, while there was only an inflammatory component no clear signs of an abscess.  I explained to him that it would be important to do surveillance of his liver lesion with a repeat MRI of the liver in January 2022.  The patient understood and agreed.  He will give [place] a call around November to place the order.  [name], MD Gastroenterology and Hepatology [facility]

## 2020-03-12 DIAGNOSIS — I2510 Atherosclerotic heart disease of native coronary artery without angina pectoris: Secondary | ICD-10-CM | POA: Diagnosis not present

## 2020-03-12 DIAGNOSIS — I129 Hypertensive chronic kidney disease with stage 1 through stage 4 chronic kidney disease, or unspecified chronic kidney disease: Secondary | ICD-10-CM | POA: Diagnosis not present

## 2020-03-12 DIAGNOSIS — K5792 Diverticulitis of intestine, part unspecified, without perforation or abscess without bleeding: Secondary | ICD-10-CM | POA: Diagnosis not present

## 2020-03-12 DIAGNOSIS — C7951 Secondary malignant neoplasm of bone: Secondary | ICD-10-CM | POA: Diagnosis not present

## 2020-03-12 DIAGNOSIS — C61 Malignant neoplasm of prostate: Secondary | ICD-10-CM | POA: Diagnosis not present

## 2020-03-12 DIAGNOSIS — N1832 Chronic kidney disease, stage 3b: Secondary | ICD-10-CM | POA: Diagnosis not present

## 2020-03-13 DIAGNOSIS — I959 Hypotension, unspecified: Secondary | ICD-10-CM | POA: Diagnosis not present

## 2020-03-13 DIAGNOSIS — M6281 Muscle weakness (generalized): Secondary | ICD-10-CM | POA: Diagnosis not present

## 2020-03-13 DIAGNOSIS — I129 Hypertensive chronic kidney disease with stage 1 through stage 4 chronic kidney disease, or unspecified chronic kidney disease: Secondary | ICD-10-CM | POA: Diagnosis not present

## 2020-03-13 DIAGNOSIS — M5442 Lumbago with sciatica, left side: Secondary | ICD-10-CM | POA: Diagnosis not present

## 2020-03-13 DIAGNOSIS — G8929 Other chronic pain: Secondary | ICD-10-CM | POA: Diagnosis not present

## 2020-03-13 DIAGNOSIS — E559 Vitamin D deficiency, unspecified: Secondary | ICD-10-CM | POA: Diagnosis not present

## 2020-03-13 DIAGNOSIS — N189 Chronic kidney disease, unspecified: Secondary | ICD-10-CM | POA: Diagnosis not present

## 2020-03-13 DIAGNOSIS — E7849 Other hyperlipidemia: Secondary | ICD-10-CM | POA: Diagnosis not present

## 2020-03-13 DIAGNOSIS — N179 Acute kidney failure, unspecified: Secondary | ICD-10-CM | POA: Diagnosis not present

## 2020-03-13 DIAGNOSIS — M5441 Lumbago with sciatica, right side: Secondary | ICD-10-CM | POA: Diagnosis not present

## 2020-03-13 DIAGNOSIS — A419 Sepsis, unspecified organism: Secondary | ICD-10-CM | POA: Diagnosis not present

## 2020-03-13 DIAGNOSIS — R6521 Severe sepsis with septic shock: Secondary | ICD-10-CM | POA: Diagnosis not present

## 2020-03-16 ENCOUNTER — Telehealth: Payer: Self-pay

## 2020-03-16 NOTE — Telephone Encounter (Signed)
I spoke with patient and wife. He takes prednisone daily. He has not experienced sensation of fast heart rate. I advised them that they could take an extra 25 mg of lopressor if needed.

## 2020-03-16 NOTE — Telephone Encounter (Signed)
-----   Message from Imogene Burn, PA-C sent at 03/16/2020  7:46 AM EDT ----- No atrial flutter but several runs of SVT one that lasted 4 min. Ask him if he was on steroids while wearing the monitor. He can take and extra Toprol 25 mg as needed for racing heart. We may need to increase toprol if he continues to have SVT. Keep f/u as scheduled with [place]. thanks

## 2020-03-20 DIAGNOSIS — K5792 Diverticulitis of intestine, part unspecified, without perforation or abscess without bleeding: Secondary | ICD-10-CM | POA: Diagnosis not present

## 2020-03-20 DIAGNOSIS — C7951 Secondary malignant neoplasm of bone: Secondary | ICD-10-CM | POA: Diagnosis not present

## 2020-03-20 DIAGNOSIS — N1832 Chronic kidney disease, stage 3b: Secondary | ICD-10-CM | POA: Diagnosis not present

## 2020-03-20 DIAGNOSIS — I129 Hypertensive chronic kidney disease with stage 1 through stage 4 chronic kidney disease, or unspecified chronic kidney disease: Secondary | ICD-10-CM | POA: Diagnosis not present

## 2020-03-20 DIAGNOSIS — C61 Malignant neoplasm of prostate: Secondary | ICD-10-CM | POA: Diagnosis not present

## 2020-03-20 DIAGNOSIS — I2510 Atherosclerotic heart disease of native coronary artery without angina pectoris: Secondary | ICD-10-CM | POA: Diagnosis not present

## 2020-03-31 ENCOUNTER — Other Ambulatory Visit: Payer: Self-pay

## 2020-03-31 ENCOUNTER — Encounter: Payer: Self-pay | Admitting: Student

## 2020-03-31 ENCOUNTER — Ambulatory Visit (INDEPENDENT_AMBULATORY_CARE_PROVIDER_SITE_OTHER): Payer: Medicare Other | Admitting: Student

## 2020-03-31 VITALS — BP 134/66 | HR 62 | Ht 66.0 in | Wt 161.0 lb

## 2020-03-31 DIAGNOSIS — E785 Hyperlipidemia, unspecified: Secondary | ICD-10-CM | POA: Diagnosis not present

## 2020-03-31 DIAGNOSIS — R600 Localized edema: Secondary | ICD-10-CM | POA: Diagnosis not present

## 2020-03-31 DIAGNOSIS — I25810 Atherosclerosis of coronary artery bypass graft(s) without angina pectoris: Secondary | ICD-10-CM

## 2020-03-31 DIAGNOSIS — I4892 Unspecified atrial flutter: Secondary | ICD-10-CM

## 2020-03-31 DIAGNOSIS — I10 Essential (primary) hypertension: Secondary | ICD-10-CM | POA: Diagnosis not present

## 2020-03-31 MED ORDER — APIXABAN 5 MG PO TABS: 5.0000 mg | ORAL_TABLET | Freq: Two times a day (BID) | ORAL | 11 refills | Status: AC

## 2020-03-31 MED ORDER — METOPROLOL TARTRATE 75 MG PO TABS: 75.0000 mg | ORAL_TABLET | Freq: Two times a day (BID) | ORAL | 3 refills | Status: DC

## 2020-03-31 NOTE — Progress Notes (Signed)
Cardiology Office Note    Date:  03/31/2020   ID:  MALVERN KADLEC, DOB 1941-05-19, MRN [id number]  PCP:  Celene Squibb, MD  Cardiologist: Rozann Lesches, MD    Chief Complaint  Patient presents with  . Follow-up    6 week visit    History of Present Illness:    Samuel Gregory is a 79 y.o. male with past medical history of CAD (s/p CABG in 2000, NST in 10/2017 showing mild to moderate per-infarct ischemia with medical management recommended), paroxysmal atrial flutter (diagnosed in 12/2019 while admitted for sepsis and found to have a new liver lesion), HTN, HLD and prostate cancer (with bone mets) who presents to the office today for 6-week follow-up.  He was last examined by Ermalinda Barrios, PA-C on 02/17/2020 for hospital follow-up in regards to his new diagnosis of paroxysmal atrial flutter which was documented while admitted for sepsis. At the time of his visit, he reported some fluttering sensations along his chest over the past few years but denied any persistent symptoms. He was continued on Lopressor 75 mg twice daily and Eliquis with plans for a 30-day monitor to assess for recurrence of his arrhythmia. His event monitor showed episodes of PAC's and PVC's with several runs of SVT but no definitive atrial fibrillation or atrial flutter. Was recommended he could take an extra Lopressor tablet if needed for palpitations  He also underwent a liver biopsy in the interim which showed no evidence of malignancy. It was recommended by GI for him to have a repeat MRI in 08/2020 for surveillance of the liver lesion.  In talking with the patient and his wife today, he reports overall doing well from a cardiac perspective since his last visit. His activity is limited secondary to back pain and he is scheduled for a repeat injection at [facility] next month. He denies any recent chest pain or dyspnea on exertion. No recent orthopnea or PND. He denies any recent palpitations and was unaware of his arrhythmias  while he had his monitor on. He does report occasional lower extremity edema which is typically along his ankle joints but denies any pitting edema along his lower legs.   Past Medical History:  Diagnosis Date  . Arthritis   . Atrial flutter ([place])    a. diagnosed in 12/2019 while admitted for sepsis  . Collagen vascular disease ([place])   . Coronary atherosclerosis of native coronary artery    a. s/p CABG in 2000 b. NST in 10/2017 showing mild to moderate per-infarct ischemia with medical management recommended  . Essential hypertension   . Hyperlipidemia   . Prostate cancer [facility])     Past Surgical History:  Procedure Laterality Date  . APPENDECTOMY     8 yrs ago- Dr Geroge Baseman  . CATARACT EXTRACTION W/PHACO Left 07/14/2014   Procedure: CATARACT EXTRACTION PHACO AND INTRAOCULAR LENS PLACEMENT (IOC);  Surgeon: Tonny Branch, MD;  Location: [facility] ORS;  Service: Ophthalmology;  Laterality: Left;  CDE:6.20  . CATARACT EXTRACTION W/PHACO Right 08/11/2014   Procedure: CATARACT EXTRACTION PHACO AND INTRAOCULAR LENS PLACEMENT RIGHT EYE;  Surgeon: Tonny Branch, MD;  Location: [facility] ORS;  Service: Ophthalmology;  Laterality: Right;  CDE:10.11  . COLONOSCOPY N/A 01/14/2015   Procedure: COLONOSCOPY;  Surgeon: Rogene Houston, MD;  Location: [facility] ENDO SUITE;  Service: Endoscopy;  Laterality: N/A;  730  . CORONARY ARTERY BYPASS GRAFT  01/1999   LIMA to LAD, SVG to diagonal, sequential SVG to PDA and PLB  .  PROSTATECTOMY  2004   [name]  . TONSILLECTOMY     age 26    Current Medications: Outpatient Medications Prior to Visit  Medication Sig Dispense Refill  . acetaminophen (TYLENOL) 325 MG tablet Take 2 tablets (650 mg total) by mouth every 6 (six) hours as needed for mild pain, fever or headache (or Fever >/= 101). 12 tablet 1  . Calcium-Magnesium-Vitamin D (CALCIUM 1200+D3 PO) Take 1 tablet by mouth daily.     . cholecalciferol (VITAMIN D3) 25 MCG (1000 UNIT) tablet Take 5,000 Units by mouth daily.       . fluticasone (FLONASE) 50 MCG/ACT nasal spray Place 1 spray into both nostrils daily as needed for allergies or rhinitis.    . Multiple Vitamin (MULTIVITAMIN WITH MINERALS) TABS Take 1 tablet by mouth daily.    . nitroGLYCERIN (NITROSTAT) 0.4 MG SL tablet Place 0.4 mg under the tongue every 5 (five) minutes as needed for chest pain.    . predniSONE (DELTASONE) 5 MG tablet Take 5 mg by mouth 2 (two) times daily.   11  . rosuvastatin (CRESTOR) 40 MG tablet Take 1 tablet (40 mg total) by mouth daily after supper. 90 tablet 3  . Thiamine HCl (VITAMIN B-1 PO) Take 1 tablet by mouth every morning.     Marland Kitchen ZYTIGA 250 MG tablet Take 4 tablets by mouth every morning.   11  . apixaban (ELIQUIS) 5 MG TABS tablet Take 1 tablet (5 mg total) by mouth 2 (two) times daily. 60 tablet 1  . metoprolol tartrate (LOPRESSOR) 25 MG tablet Take 75 mg by mouth 2 (two) times daily.    . pantoprazole (PROTONIX) 40 MG tablet Take 40 mg by mouth daily as needed (for acid reflux).      No facility-administered medications prior to visit.     Allergies:   Latex and Sulfa antibiotics   Social History   Socioeconomic History  . Marital status: Married    Spouse name: Not on file  . Number of children: Not on file  . Years of education: Not on file  . Highest education level: Not on file  Occupational History  . Not on file  Tobacco Use  . Smoking status: Never Smoker  . Smokeless tobacco: Never Used  Vaping Use  . Vaping Use: Never used  Substance and Sexual Activity  . Alcohol use: Yes    Alcohol/week: 0.0 standard drinks    Comment: daily-glass of wine  . Drug use: No  . Sexual activity: Not on file  Other Topics Concern  . Not on file  Social History Narrative  . Not on file   Social Determinants of Health   Financial Resource Strain:   . Difficulty of Paying Living Expenses: Not on file  Food Insecurity:   . Worried About Charity fundraiser in the Last Year: Not on file  . Ran Out of Food in the  Last Year: Not on file  Transportation Needs:   . Lack of Transportation (Medical): Not on file  . Lack of Transportation (Non-Medical): Not on file  Physical Activity:   . Days of Exercise per Week: Not on file  . Minutes of Exercise per Session: Not on file  Stress:   . Feeling of Stress : Not on file  Social Connections:   . Frequency of Communication with Friends and Family: Not on file  . Frequency of Social Gatherings with Friends and Family: Not on file  . Attends Religious Services: Not  on file  . Active Member of Clubs or Organizations: Not on file  . Attends Archivist Meetings: Not on file  . Marital Status: Not on file     Family History:  The patient's family history includes Aneurysm in his father; Cancer in his mother; Heart attack in his mother.   Review of Systems:   Please see the history of present illness.     General:  No chills, fever, night sweats or weight changes. Positive for back pain.  Cardiovascular:  No chest pain, dyspnea on exertion, edema, orthopnea, palpitations, paroxysmal nocturnal dyspnea. Dermatological: No rash, lesions/masses Respiratory: No cough, dyspnea Urologic: No hematuria, dysuria Abdominal:   No nausea, vomiting, diarrhea, bright red blood per rectum, melena, or hematemesis Neurologic:  No visual changes, wkns, changes in mental status. All other systems reviewed and are otherwise negative except as noted above.   Physical Exam:    VS:  BP 134/66   Pulse 62   Ht 5\' 6"  (1.676 m)   Wt 161 lb (73 kg)   SpO2 98%   BMI 25.99 kg/m    General: Well developed, well nourished,male appearing in no acute distress. Head: Normocephalic, atraumatic. Neck: No carotid bruits. JVD not elevated.  Lungs: Respirations regular and unlabored, without wheezes or rales.  Heart: Regular rate and rhythm. No S3 or S4.  No murmur, no rubs, or gallops appreciated. Abdomen: Appears non-distended. No obvious abdominal masses. Msk:  Strength  and tone appear normal for age. No obvious joint deformities or effusions. Extremities: No clubbing or cyanosis. Isolated ankle edema bilaterally. No pitting edema.  Distal pedal pulses are 2+ bilaterally. Neuro: Alert and oriented X 3. Moves all extremities spontaneously. No focal deficits noted. Psych:  Responds to questions appropriately with a normal affect. Skin: No rashes or lesions noted  Wt Readings from Last 3 Encounters:  03/31/20 161 lb (73 kg)  03/02/20 157 lb 4.8 oz (71.4 kg)  02/25/20 159 lb (72.1 kg)     Studies/Labs Reviewed:   EKG:  EKG is not ordered today.    Recent Labs: 01/24/2020: Magnesium 2.3 02/05/2020: ALT 24; BUN 18; Creatinine, Ser 1.29; Potassium 3.0; Sodium 133 02/25/2020: Hemoglobin 11.3; Platelets 229   Lipid Panel    Component Value Date/Time   CHOL 155 03/01/2013 0735   TRIG 67 03/01/2013 0735   HDL 49 03/01/2013 0735   CHOLHDL 3.2 03/01/2013 0735   VLDL 13 03/01/2013 0735   LDLCALC 93 03/01/2013 0735    Additional studies/ records that were reviewed today include:   Echocardiogram: 12/2019 IMPRESSIONS    1. Left ventricular ejection fraction, by estimation, is 60 to 65%. The  left ventricle has normal function. The left ventricle has no regional  wall motion abnormalities. There is mild left ventricular hypertrophy.  Left ventricular diastolic parameters  are consistent with Grade I diastolic dysfunction (impaired relaxation).  2. Right ventricular systolic function is normal. The right ventricular  size is normal. There is mildly elevated pulmonary artery systolic  pressure.  3. The mitral valve is normal in structure. No evidence of mitral valve  regurgitation. No evidence of mitral stenosis.  4. The aortic valve is tricuspid. Aortic valve regurgitation is not  visualized. No aortic stenosis is present.  5. The inferior vena cava is normal in size with greater than 50%  respiratory variability, suggesting right atrial pressure  of 3 mmHg.    Event Monitor: 01/2020 ZIO AT reviewed, 13 days 15 hours analyzed.  Predominant rhythm is  sinus with heart rate ranging from 43 bpm up to 113 bpm and average heart rate 66 bpm.  There were rare PACs and PVCs representing less than 1% total beats.  Frequent episodes of PSVT versus atrial tachycardia were noted, the longest of which lasted for 4 minutes and 46 seconds.  There was also a brief episode of idioventricular rhythm at 62 bpm during early morning hours on July 23.  No pauses.  No definite atrial fibrillation.   Assessment:    1. Coronary artery disease involving coronary bypass graft of native heart without angina pectoris   2. Atrial flutter, paroxysmal ([place])   3. Bilateral lower extremity edema   4. Essential hypertension   5. Hyperlipidemia LDL goal <70      Plan:   In order of problems listed above:  1. CAD - He is s/p CABG in 2000 with NST in 10/2017 showing mild to moderate per-infarct ischemia with medical management recommended. Recent echocardiogram earlier this year showed a preserved EF with no regional wall motion normalities as outlined above. - He denies any recent chest pain or dyspnea on exertion. Continue with medical therapy. He remains on Lopressor 75 mg twice daily and Crestor 40 mg daily. He is no longer on ASA given the need for anticoagulation.  2. Paroxysmal Atrial Flutter - This was diagnosed in 12/2019 while admitted for sepsis and recent monitor showed no definitive recurrent atrial flutter/fibrillation but he did have frequent PAC's and PVC's with several runs of SVT. - He is unaware of his arrhythmias and denies any recent palpitations. Given that his heart rate was overall well controlled by his monitor, will continue Lopressor 75 mg twice daily. - He reports good compliance with Eliquis and denies any evidence of active bleeding. Reviewed with Dr. Domenic Polite prior to the patient's visit and will continue Eliquis for now given his elevated  CHA2DS2-VASc Score of 4 and increased risk for recurrent arrhythmias given his frequent arrhythmias by recent monitor. The patient and his wife are in agreement with this.  3. Lower Extremity Edema - He does have edema isolated to his ankles bilaterally which is likely secondary to arthritis. Recent echocardiogram showed a preserved EF with grade 1 diastolic dysfunction and we reviewed this could be contributing some to his symptoms. Given no pitting edema on examination, I recommended conservative treatment at this time with elevation of his lower extremities and the use of compression stockings. He already follows a low-sodium diet. Could consider the use of a PRN diuretic if he developed progressive symptoms.  4. HTN - BP is well controlled at 134/66 during today's visit. Continue current medication regimen with Lopressor 75 mg twice daily.  5. HLD - Followed by his PCP. He remains on Crestor 40 mg daily with goal LDL less than 70 in the setting of known CAD.    Medication Adjustments/Labs and Tests Ordered: Current medicines are reviewed at length with the patient today.  Concerns regarding medicines are outlined above.  Medication changes, Labs and Tests ordered today are listed in the Patient Instructions below. Patient Instructions  Medication Instructions:  Your physician recommends that you continue on your current medications as directed. Please refer to the Current Medication list given to you today.  *If you need a refill on your cardiac medications before your next appointment, please call your pharmacy*   Lab Work: NONE   If you have labs (blood work) drawn today and your tests are completely normal, you will receive your results only  by: . MyChart Message (if you have MyChart) OR . A paper copy in the mail If you have any lab test that is abnormal or we need to change your treatment, we will call you to review the results.   Testing/Procedures: NONE    Follow-Up: At  [facility], you and your health needs are our priority.  As part of our continuing mission to provide you with exceptional heart care, we have created designated Provider Care Teams.  These Care Teams include your primary Cardiologist (physician) and Advanced Practice Providers (APPs -  Physician Assistants and Nurse Practitioners) who all work together to provide you with the care you need, when you need it.  We recommend signing up for the patient portal called "MyChart".  Sign up information is provided on this After Visit Summary.  MyChart is used to connect with patients for Virtual Visits (Telemedicine).  Patients are able to view lab/test results, encounter notes, upcoming appointments, etc.  Non-urgent messages can be sent to your provider as well.   To learn more about what you can do with MyChart, go to [email or web address].    Your next appointment:   6 month(s)  The format for your next appointment:   In Person  Provider:   Rozann Lesches, MD   Other Instructions Thank you for choosing [facility]!    Signed, Erma Heritage, PA-C  03/31/2020 2:09 PM    [facility]. [address] [place], [place] [address] Phone: [phone] Fax: [phone]

## 2020-03-31 NOTE — Patient Instructions (Signed)
Medication Instructions:  Your physician recommends that you continue on your current medications as directed. Please refer to the Current Medication list given to you today.  *If you need a refill on your cardiac medications before your next appointment, please call your pharmacy*   Lab Work: NONE   If you have labs (blood work) drawn today and your tests are completely normal, you will receive your results only by: . MyChart Message (if you have MyChart) OR . A paper copy in the mail If you have any lab test that is abnormal or we need to change your treatment, we will call you to review the results.   Testing/Procedures: NONE    Follow-Up: At [facility], you and your health needs are our priority.  As part of our continuing mission to provide you with exceptional heart care, we have created designated Provider Care Teams.  These Care Teams include your primary Cardiologist (physician) and Advanced Practice Providers (APPs -  Physician Assistants and Nurse Practitioners) who all work together to provide you with the care you need, when you need it.  We recommend signing up for the patient portal called "MyChart".  Sign up information is provided on this After Visit Summary.  MyChart is used to connect with patients for Virtual Visits (Telemedicine).  Patients are able to view lab/test results, encounter notes, upcoming appointments, etc.  Non-urgent messages can be sent to your provider as well.   To learn more about what you can do with MyChart, go to [email or web address].    Your next appointment:   6 month(s)  The format for your next appointment:   In Person  Provider:   Samuel McDowell, MD   Other Instructions Thank you for choosing [facility]!    

## 2020-04-02 DIAGNOSIS — H838X3 Other specified diseases of inner ear, bilateral: Secondary | ICD-10-CM | POA: Diagnosis not present

## 2020-04-02 DIAGNOSIS — H6123 Impacted cerumen, bilateral: Secondary | ICD-10-CM | POA: Diagnosis not present

## 2020-04-02 DIAGNOSIS — H903 Sensorineural hearing loss, bilateral: Secondary | ICD-10-CM | POA: Diagnosis not present

## 2020-04-10 DIAGNOSIS — M5136 Other intervertebral disc degeneration, lumbar region: Secondary | ICD-10-CM | POA: Diagnosis not present

## 2020-04-10 DIAGNOSIS — M5416 Radiculopathy, lumbar region: Secondary | ICD-10-CM | POA: Diagnosis not present

## 2020-04-17 DIAGNOSIS — I129 Hypertensive chronic kidney disease with stage 1 through stage 4 chronic kidney disease, or unspecified chronic kidney disease: Secondary | ICD-10-CM | POA: Diagnosis not present

## 2020-04-17 DIAGNOSIS — I959 Hypotension, unspecified: Secondary | ICD-10-CM | POA: Diagnosis not present

## 2020-04-17 DIAGNOSIS — G8929 Other chronic pain: Secondary | ICD-10-CM | POA: Diagnosis not present

## 2020-04-17 DIAGNOSIS — R6521 Severe sepsis with septic shock: Secondary | ICD-10-CM | POA: Diagnosis not present

## 2020-04-17 DIAGNOSIS — E559 Vitamin D deficiency, unspecified: Secondary | ICD-10-CM | POA: Diagnosis not present

## 2020-04-17 DIAGNOSIS — M5441 Lumbago with sciatica, right side: Secondary | ICD-10-CM | POA: Diagnosis not present

## 2020-04-17 DIAGNOSIS — N179 Acute kidney failure, unspecified: Secondary | ICD-10-CM | POA: Diagnosis not present

## 2020-04-17 DIAGNOSIS — M6281 Muscle weakness (generalized): Secondary | ICD-10-CM | POA: Diagnosis not present

## 2020-04-17 DIAGNOSIS — E7849 Other hyperlipidemia: Secondary | ICD-10-CM | POA: Diagnosis not present

## 2020-04-17 DIAGNOSIS — N189 Chronic kidney disease, unspecified: Secondary | ICD-10-CM | POA: Diagnosis not present

## 2020-04-17 DIAGNOSIS — M5442 Lumbago with sciatica, left side: Secondary | ICD-10-CM | POA: Diagnosis not present

## 2020-04-17 DIAGNOSIS — A419 Sepsis, unspecified organism: Secondary | ICD-10-CM | POA: Diagnosis not present

## 2020-05-01 DIAGNOSIS — L03116 Cellulitis of left lower limb: Secondary | ICD-10-CM | POA: Diagnosis not present

## 2020-05-01 DIAGNOSIS — L309 Dermatitis, unspecified: Secondary | ICD-10-CM | POA: Diagnosis not present

## 2020-05-01 DIAGNOSIS — L853 Xerosis cutis: Secondary | ICD-10-CM | POA: Diagnosis not present

## 2020-05-08 DIAGNOSIS — M6281 Muscle weakness (generalized): Secondary | ICD-10-CM | POA: Diagnosis not present

## 2020-05-08 DIAGNOSIS — R6521 Severe sepsis with septic shock: Secondary | ICD-10-CM | POA: Diagnosis not present

## 2020-05-08 DIAGNOSIS — N179 Acute kidney failure, unspecified: Secondary | ICD-10-CM | POA: Diagnosis not present

## 2020-05-08 DIAGNOSIS — E7849 Other hyperlipidemia: Secondary | ICD-10-CM | POA: Diagnosis not present

## 2020-05-08 DIAGNOSIS — I959 Hypotension, unspecified: Secondary | ICD-10-CM | POA: Diagnosis not present

## 2020-05-08 DIAGNOSIS — E559 Vitamin D deficiency, unspecified: Secondary | ICD-10-CM | POA: Diagnosis not present

## 2020-05-08 DIAGNOSIS — G8929 Other chronic pain: Secondary | ICD-10-CM | POA: Diagnosis not present

## 2020-05-08 DIAGNOSIS — M5442 Lumbago with sciatica, left side: Secondary | ICD-10-CM | POA: Diagnosis not present

## 2020-05-08 DIAGNOSIS — I129 Hypertensive chronic kidney disease with stage 1 through stage 4 chronic kidney disease, or unspecified chronic kidney disease: Secondary | ICD-10-CM | POA: Diagnosis not present

## 2020-05-08 DIAGNOSIS — N189 Chronic kidney disease, unspecified: Secondary | ICD-10-CM | POA: Diagnosis not present

## 2020-05-08 DIAGNOSIS — A419 Sepsis, unspecified organism: Secondary | ICD-10-CM | POA: Diagnosis not present

## 2020-05-08 DIAGNOSIS — M5441 Lumbago with sciatica, right side: Secondary | ICD-10-CM | POA: Diagnosis not present

## 2020-05-15 ENCOUNTER — Other Ambulatory Visit: Payer: Self-pay

## 2020-05-15 ENCOUNTER — Other Ambulatory Visit: Payer: Medicare Other

## 2020-05-15 DIAGNOSIS — C61 Malignant neoplasm of prostate: Secondary | ICD-10-CM | POA: Diagnosis not present

## 2020-05-15 NOTE — Addendum Note (Signed)
Addended by: Pollyann Kennedy F on: 05/15/2020 11:43 AM   Modules accepted: Orders

## 2020-05-16 LAB — PSA: Prostate Specific Ag, Serum: <0.1 ng/mL (ref 0.0–4.0)

## 2020-05-18 ENCOUNTER — Telehealth: Payer: Self-pay | Admitting: *Deleted

## 2020-05-18 NOTE — Telephone Encounter (Signed)
° °  [facility] Pre-operative Risk Assessment    HEARTCARE STAFF: - Please ensure there is not already an duplicate clearance open for this procedure. - Under Visit Info/Reason for Call, type in Other and utilize the format Clearance MM/DD/YY or Clearance TBD. Do not use dashes or single digits. - If request is for dental extraction, please clarify the # of teeth to be extracted.  Request for surgical clearance:  1. What type of surgery is being performed? LUMBAR FUSION   2. When is this surgery scheduled? 08/27/20   3. What type of clearance is required (medical clearance vs. Pharmacy clearance to hold med vs. Both)? BOTH  4. Are there any medications that need to be held prior to surgery and how long? ELIQUIS   5. Practice name and name of physician performing surgery? [facility]; DR. Harrell Gave Lockyer   6. What is the office phone number? [phone]   7.   What is the office fax number? [phone]  8.   Anesthesia type (None, local, MAC, general) ? NOT LISTED   Julaine Hua 05/18/2020, 4:27 PM  _________________________________________________________________   (provider comments below)

## 2020-05-19 NOTE — Telephone Encounter (Signed)
   Primary Cardiologist: Rozann Lesches, MD  Chart reviewed as part of pre-operative protocol coverage. Patient was contacted 05/19/2020 in reference to pre-operative risk assessment for pending surgery as outlined below.  Samuel Gregory was last seen on 03/31/20 by Bernerd Pho PAC.  Since that day, Samuel Gregory has done well.  He had a nuclear stress test in 2019 consistent with scar. He can complete 4.0 METS without angina. He is primarily limited by his back pain.  Given his history, he has 6.6% chance of a major cardiac complication in the perioperative period. He accepts this risk and wishes to proceed.   Per our clinical pharmacist: Patient with diagnosis of A fib/A flutter on Eliquis for anticoagulation.    Procedure: LUMBAR FUSION Date of procedure: 08/27/20  [id number]}  CHA2DS2-VASc Score = 4  This indicates a 4.8% annual risk of stroke. The patient's score is based upon: CHF History: 0 HTN History: 1 Diabetes History: 0 Stroke History: 0 Vascular Disease History: 1 Age Score: 2 Gender Score: 0   CrCl 48 mL/min Platelet count 229K  Per office protocol, patient can hold Eliquis for 3 days prior to procedure.   Therefore, based on ACC/AHA guidelines, the patient would be at acceptable risk for the planned procedure without further cardiovascular testing.   The patient was advised that if he develops new symptoms prior to surgery to contact our office to arrange for a follow-up visit, and he verbalized understanding.  I will route this recommendation to the requesting party via Epic fax function and remove from pre-op pool. Please call with questions.  Samuel Lin Devontre Siedschlag, PA 05/19/2020, 1:35 PM

## 2020-05-19 NOTE — Telephone Encounter (Signed)
Patient with diagnosis of A fib/A flutter on Eliquis for anticoagulation.    Procedure:  LUMBAR FUSION  Date of procedure: 08/27/20  [id number]}  CHA2DS2-VASc Score = 4  This indicates a 4.8% annual risk of stroke. The patient's score is based upon: CHF History: 0 HTN History: 1 Diabetes History: 0 Stroke History: 0 Vascular Disease History: 1 Age Score: 2 Gender Score: 0   CrCl 48 mL/min Platelet count 229K  Per office protocol, patient can hold Eliquis for 3 days prior to procedure.

## 2020-05-20 ENCOUNTER — Encounter: Payer: Self-pay | Admitting: Urology

## 2020-05-20 ENCOUNTER — Other Ambulatory Visit: Payer: Self-pay

## 2020-05-20 ENCOUNTER — Ambulatory Visit (INDEPENDENT_AMBULATORY_CARE_PROVIDER_SITE_OTHER): Payer: Medicare Other | Admitting: Urology

## 2020-05-20 VITALS — BP 156/89 | HR 75 | Temp 98.3°F | Ht 66.0 in | Wt 161.0 lb

## 2020-05-20 DIAGNOSIS — I25810 Atherosclerosis of coronary artery bypass graft(s) without angina pectoris: Secondary | ICD-10-CM | POA: Diagnosis not present

## 2020-05-20 DIAGNOSIS — R351 Nocturia: Secondary | ICD-10-CM | POA: Diagnosis not present

## 2020-05-20 DIAGNOSIS — C61 Malignant neoplasm of prostate: Secondary | ICD-10-CM

## 2020-05-20 LAB — MICROSCOPIC EXAMINATION
Bacteria, UA: NONE SEEN
Epithelial Cells (non renal): NONE SEEN /hpf (ref 0–10)
Renal Epithel, UA: NONE SEEN /hpf
WBC, UA: NONE SEEN /hpf (ref 0–5)

## 2020-05-20 LAB — URINALYSIS, ROUTINE W REFLEX MICROSCOPIC
Bilirubin, UA: NEGATIVE
Glucose, UA: NEGATIVE
Ketones, UA: NEGATIVE
Leukocytes,UA: NEGATIVE
Nitrite, UA: NEGATIVE
Specific Gravity, UA: 1.01 (ref 1.005–1.030)
Urobilinogen, Ur: 0.2 mg/dL (ref 0.2–1.0)
pH, UA: 6 (ref 5.0–7.5)

## 2020-05-20 MED ORDER — LEUPROLIDE ACETATE (6 MONTH) 45 MG IM KIT
45.0000 mg | PACK | Freq: Once | INTRAMUSCULAR | Status: AC
  Administered 2020-05-20: 45 mg via INTRAMUSCULAR

## 2020-05-20 NOTE — Patient Instructions (Signed)
Prostate Cancer  The prostate is a male gland that helps make semen. Prostate cancer is when abnormal cells grow in this gland. Follow these instructions at home:  Take over-the-counter and prescription medicines only as told by your doctor.  Eat a healthy diet.  Get plenty of sleep.  Ask your doctor for help to find a support group for men with prostate cancer.  Keep all follow-up visits as told by your doctor. This is important.  If you have to go to the hospital, let your cancer doctor (oncologist) know.  Touch, hold, hug, and caress your partner to continue to show sexual feelings. Contact a doctor if:  You have trouble peeing (urinating).  You have blood in your pee (urine).  You have pain in your hips, back, or chest. Get help right away if:  You have weakness in your legs.  You lose feeling (have numbness) in your legs.  You cannot control your pee or your poop (stool).  You have trouble breathing.  You have sudden pain in your chest.  You have chills or a fever. Summary  The prostate is a male gland that helps make semen. Prostate cancer is when abnormal cells grow in this gland.  Ask your doctor for help to find a support group for men with prostate cancer.  Contact a doctor if you have problems peeing or have any new pain that you did not have before. This information is not intended to replace advice given to you by your health care provider. Make sure you discuss any questions you have with your health care provider. Document Revised: 06/30/2017 Document Reviewed: 03/28/2016 Elsevier Patient Education  2020 Elsevier Inc.  

## 2020-05-20 NOTE — Progress Notes (Signed)
05/20/2020 2:42 PM   Samuel Gregory 01-27-1941 [id number]  Referring provider: Celene Squibb, MD [address],  [place] [address]  Prostate cancer and nocturia  HPI: Samuel Gregory is a 79yo here for followup for prostate cancer. PSA remains undetectable. He is due for lupron today. He has mild SUI and nocturia 1-2x with fluid management. No other LUTS. No hematuria or dysuria   PMH: Past Medical History:  Diagnosis Date  . Arthritis   . Atrial flutter ([place])    a. diagnosed in 12/2019 while admitted for sepsis  . Collagen vascular disease ([place])   . Coronary atherosclerosis of native coronary artery    a. s/p CABG in 2000 b. NST in 10/2017 showing mild to moderate per-infarct ischemia with medical management recommended  . Essential hypertension   . Hyperlipidemia   . Prostate cancer [facility])     Surgical History: Past Surgical History:  Procedure Laterality Date  . APPENDECTOMY     8 yrs ago- Dr Geroge Baseman  . CATARACT EXTRACTION W/PHACO Left 07/14/2014   Procedure: CATARACT EXTRACTION PHACO AND INTRAOCULAR LENS PLACEMENT (IOC);  Surgeon: Tonny Branch, MD;  Location: [facility] ORS;  Service: Ophthalmology;  Laterality: Left;  CDE:6.20  . CATARACT EXTRACTION W/PHACO Right 08/11/2014   Procedure: CATARACT EXTRACTION PHACO AND INTRAOCULAR LENS PLACEMENT RIGHT EYE;  Surgeon: Tonny Branch, MD;  Location: [facility] OR[facility];  Service: Ophthalmology;  Laterality: Right;  CDE:10.11  . COLONOSCOPY N/A 01/14/2015   Procedure: COLONOSCOPY;  Surgeon: Rogene Houston, MD;  Location: [facility] END[facility] SUITE;  Service: Endoscopy;  Laterality: N/A;  730  . CORONARY ARTERY BYPASS GRAFT  01/1999   LIMA to LAD, SVG to diagonal, sequential SVG to PDA and PLB  . PROSTATECTOMY  2004   Samuel Gregory  . TONSILLECTOMY     age 53    Home Medications:  Allergies as of 05/20/2020      Reactions   Latex Rash   Sulfa Antibiotics Rash      Medication List       Accurate as of May 20, 2020  2:42 PM. If you have any  questions, ask your nurse or doctor.        STOP taking these medications   predniSONE 5 MG tablet Commonly known as: DELTASONE Stopped by: Nicolette Bang, MD     TAKE these medications   acetaminophen 325 MG tablet Commonly known as: TYLENOL Take 2 tablets (650 mg total) by mouth every 6 (six) hours as needed for mild pain, fever or headache (or Fever >/= 101).   apixaban 5 MG Tabs tablet Commonly known as: ELIQUIS Take 1 tablet (5 mg total) by mouth 2 (two) times daily.   CALCIUM 1200+D3 PO Take 1 tablet by mouth daily.   cholecalciferol 25 MCG (1000 UNIT) tablet Commonly known as: VITAMIN D3 Take 5,000 Units by mouth daily.   fluticasone 50 MCG/ACT nasal spray Commonly known as: FLONASE Place 1 spray into both nostrils daily as needed for allergies or rhinitis.   metoprolol tartrate 25 MG tablet Commonly known as: LOPRESSOR Take by mouth. What changed: Another medication with the same name was removed. Continue taking this medication, and follow the directions you see here. Changed by: Nicolette Bang, MD   multivitamin with minerals Tabs tablet Take 1 tablet by mouth daily.   nitroGLYCERIN 0.4 MG SL tablet Commonly known as: NITROSTAT Place 0.4 mg under the tongue every 5 (five) minutes as needed for chest pain.   rosuvastatin 40  MG tablet Commonly known as: CRESTOR Take 1 tablet (40 mg total) by mouth daily after supper.   triamcinolone cream 0.1 % Commonly known as: KENALOG Apply topically.   VITAMIN B-1 PO Take 1 tablet by mouth every morning.   Zytiga 250 MG tablet Generic drug: abiraterone acetate Take 4 tablets by mouth every morning.       Allergies:  Allergies  Allergen Reactions  . Latex Rash  . Sulfa Antibiotics Rash    Family History: Family History  Problem Relation Age of Onset  . Heart attack Mother   . Cancer Mother   . Aneurysm Father     Social History:  reports that he has never smoked. He has never used smokeless  tobacco. He reports current alcohol use. He reports that he does not use drugs.  ROS: All other review of systems were reviewed and are negative except what is noted above in HPI  Physical Exam: BP (!) 156/89   Pulse 75   Temp 98.3 F (36.8 C)   Ht 5\' 6"  (1.676 m)   Wt 161 lb (73 kg)   BMI 25.99 kg/m   Constitutional:  Alert and oriented, No acute distress. HEENT: [place] AT, moist mucus membranes.  Trachea midline, no masses. Cardiovascular: No clubbing, cyanosis, or edema. Respiratory: Normal respiratory effort, no increased work of breathing. GI: Abdomen is soft, nontender, nondistended, no abdominal masses GU: No CVA tenderness.  Lymph: No cervical or inguinal lymphadenopathy. Skin: No rashes, bruises or suspicious lesions. Neurologic: Grossly intact, no focal deficits, moving all 4 extremities. Psychiatric: Normal mood and affect.  Laboratory Data: Lab Results  Component Value Date   WBC 11.7 (H) 02/25/2020   HGB 11.3 (L) 02/25/2020   HCT 36.2 (L) 02/25/2020   MCV 97.1 02/25/2020   PLT 229 02/25/2020    Lab Results  Component Value Date   CREATININE 1.29 (H) 02/05/2020    No results found for: PSA  No results found for: TESTOSTERONE  No results found for: HGBA1C  Urinalysis    Component Value Date/Time   COLORURINE YELLOW 02/04/2020 1250   APPEARANCEUR CLOUDY (A) 02/04/2020 1250   LABSPEC 1.017 02/04/2020 1250   PHURINE 5.0 02/04/2020 1250   GLUCOSEU NEGATIVE 02/04/2020 1250   HGBUR NEGATIVE 02/04/2020 1250   BILIRUBINUR NEGATIVE 02/04/2020 1250   KETONESUR NEGATIVE 02/04/2020 1250   PROTEINUR 30 (A) 02/04/2020 1250   UROBILINOGEN 0.2 10/29/2007 2036   NITRITE NEGATIVE 02/04/2020 1250   LEUKOCYTESUR NEGATIVE 02/04/2020 1250    Lab Results  Component Value Date   BACTERIA RARE (A) 02/04/2020    Pertinent Imaging:  No results found for this or any previous visit.  No results found for this or any previous visit.  No results found for this or any  previous visit.  No results found for this or any previous visit.  No results found for this or any previous visit.  No results found for this or any previous visit.  No results found for this or any previous visit.  Results for orders placed during the hospital encounter of 01/21/20  CT RENAL STONE STUDY  Narrative CLINICAL DATA:  Back pain, dysuria  EXAM: CT ABDOMEN AND PELVIS WITHOUT CONTRAST  TECHNIQUE: Multidetector CT imaging of the abdomen and pelvis was performed following the standard protocol without IV contrast.  COMPARISON:  03/08/2016  FINDINGS: Lower chest: 5 mm noncalcified posterior right lower lobe pulmonary nodule (series 4, image 40), new from prior. Mild right greater than left bibasilar subsegmental  atelectasis. Coronary artery calcifications.  Hepatobiliary: Slightly ill-defined 3.0 cm lesion within the inferior aspect of the right hepatic lobe (series 2, image 38), new from prior. There is at least 1 additional subcentimeter indeterminate lesions within the liver (series 2, image 28), not definitively seen on prior. Cholelithiasis. Gallbladder is mildly distended. No biliary dilatation.  Pancreas: Subtle area of relative low attenuation within the posterior aspect of the pancreatic head which appears new from prior (series 2, images 28-29). No peripancreatic stranding. No pancreatic ductal dilatation. No peripancreatic fluid.  Spleen: Normal in size without focal abnormality.  Adrenals/Urinary Tract: Adrenal glands are unremarkable. Kidneys are normal, without renal calculi, focal lesion, or hydronephrosis. Bladder is decompressed, limiting its evaluation.  Stomach/Bowel: Stomach is within normal limits. Appendix not visualized, and may be surgically absent. Scattered colonic diverticulosis. No evidence of bowel wall thickening, distention, or inflammatory changes.  Vascular/Lymphatic: Aortoiliac atherosclerotic calcification  without aneurysm. Mild ectasia of the right common iliac artery, unchanged. No enlarged abdominal or pelvic lymph nodes.  Reproductive: Prostatectomy.  Other: No abdominal wall hernia or abnormality. No abdominopelvic ascites.  Musculoskeletal: L3-5 PLIF. No acute osseous findings. The sclerotic osseous lesions seen on prior CT 03/08/2016 have resolved.  IMPRESSION: 1. Subtle area of relative low attenuation within the posterior aspect of the pancreatic head which appears new from prior. Pancreatic mass is not excluded. When the patient is clinically stable and able to follow directions and hold their breath (preferably as an outpatient) further evaluation with dedicated contrast-enhanced pancreatic protocol MRI of the abdomen should be considered. 2. New slightly ill-defined 3.0 cm lesion within the inferior aspect of the right hepatic lobe with at least 1 additional subcentimeter indeterminate lesions within the liver. Findings are nonspecific but could reflect hepatic metastatic disease. These findings can also be further evaluated on the previously recommended MRI. 3. 5 mm noncalcified right lower lobe pulmonary nodule, new from prior. 4. The sclerotic osseous lesions seen on prior CT 03/08/2016 have resolved. 5. Cholelithiasis without evidence of acute cholecystitis. 6. Colonic diverticulosis without evidence of acute diverticulitis. 7. Aortic atherosclerosis. (ICD10-I70.0).   Electronically Signed By: Davina Poke D.O. On: 01/21/2020 09:44   Assessment & Plan:    1. Prostate cancer ([place]) -lupron 45mg  today -PSA in 6 months - Urinalysis, Routine w reflex microscopic  2. Nocturia -continue fluid management   No follow-ups on file.  Nicolette Bang, MD  [facility] [place]

## 2020-05-20 NOTE — Progress Notes (Signed)
Urological Symptom Review  Patient is experiencing the following symptoms: Hard to postpone urination Get up at night to urinate Leakage of urine Erection problems (male only) Penile pain (male only)   Kidney stones   Review of Systems  Gastrointestinal (upper)  : Negative for upper GI symptoms  Gastrointestinal (lower) : Negative for lower GI symptoms  Constitutional : Night Sweats Fatigue  Skin: Negative for skin symptoms  Eyes: Blurred vision  Ear/Nose/Throat : Negative for Ear/Nose/Throat symptoms  Hematologic/Lymphatic: Easy bruising  Cardiovascular : Leg swelling  Respiratory : Shortness of breath  Endocrine: Negative for endocrine symptoms  Musculoskeletal: Back pain Joint pain  Neurological: Dizziness  Psychologic: Negative for psychiatric symptoms

## 2020-05-21 ENCOUNTER — Telehealth: Payer: Self-pay

## 2020-05-21 NOTE — Telephone Encounter (Signed)
Pt made aware PSA normal.

## 2020-05-21 NOTE — Telephone Encounter (Signed)
-----   Message from Cleon Gustin, MD sent at 05/19/2020  9:26 AM EDT ----- normal ----- Message ----- From: Valentina Lucks, LPN Sent: [id number]  10:58 AM EDT To: Cleon Gustin, MD  Pls review.

## 2020-06-04 ENCOUNTER — Emergency Department (HOSPITAL_COMMUNITY): Payer: Medicare Other

## 2020-06-04 ENCOUNTER — Encounter (INDEPENDENT_AMBULATORY_CARE_PROVIDER_SITE_OTHER): Payer: Self-pay | Admitting: Gastroenterology

## 2020-06-04 ENCOUNTER — Other Ambulatory Visit: Payer: Self-pay

## 2020-06-04 ENCOUNTER — Encounter (HOSPITAL_COMMUNITY): Payer: Self-pay | Admitting: Emergency Medicine

## 2020-06-04 ENCOUNTER — Ambulatory Visit (INDEPENDENT_AMBULATORY_CARE_PROVIDER_SITE_OTHER): Payer: Medicare Other | Admitting: Gastroenterology

## 2020-06-04 ENCOUNTER — Inpatient Hospital Stay (HOSPITAL_COMMUNITY)
Admission: EM | Admit: 2020-06-04 | Discharge: 2020-07-01 | DRG: 870 | Disposition: E | Payer: Medicare Other | Attending: Pulmonary Disease | Admitting: Pulmonary Disease

## 2020-06-04 DIAGNOSIS — I6523 Occlusion and stenosis of bilateral carotid arteries: Secondary | ICD-10-CM | POA: Diagnosis not present

## 2020-06-04 DIAGNOSIS — E861 Hypovolemia: Secondary | ICD-10-CM

## 2020-06-04 DIAGNOSIS — E785 Hyperlipidemia, unspecified: Secondary | ICD-10-CM | POA: Diagnosis present

## 2020-06-04 DIAGNOSIS — I462 Cardiac arrest due to underlying cardiac condition: Secondary | ICD-10-CM | POA: Diagnosis present

## 2020-06-04 DIAGNOSIS — J9601 Acute respiratory failure with hypoxia: Secondary | ICD-10-CM | POA: Diagnosis present

## 2020-06-04 DIAGNOSIS — N170 Acute kidney failure with tubular necrosis: Secondary | ICD-10-CM | POA: Diagnosis present

## 2020-06-04 DIAGNOSIS — J9811 Atelectasis: Secondary | ICD-10-CM | POA: Diagnosis present

## 2020-06-04 DIAGNOSIS — I471 Supraventricular tachycardia: Secondary | ICD-10-CM | POA: Diagnosis not present

## 2020-06-04 DIAGNOSIS — Z8546 Personal history of malignant neoplasm of prostate: Secondary | ICD-10-CM | POA: Diagnosis not present

## 2020-06-04 DIAGNOSIS — A419 Sepsis, unspecified organism: Secondary | ICD-10-CM | POA: Diagnosis present

## 2020-06-04 DIAGNOSIS — A0471 Enterocolitis due to Clostridium difficile, recurrent: Secondary | ICD-10-CM | POA: Diagnosis present

## 2020-06-04 DIAGNOSIS — R6521 Severe sepsis with septic shock: Secondary | ICD-10-CM | POA: Diagnosis not present

## 2020-06-04 DIAGNOSIS — Z951 Presence of aortocoronary bypass graft: Secondary | ICD-10-CM

## 2020-06-04 DIAGNOSIS — K761 Chronic passive congestion of liver: Secondary | ICD-10-CM | POA: Diagnosis present

## 2020-06-04 DIAGNOSIS — Z955 Presence of coronary angioplasty implant and graft: Secondary | ICD-10-CM

## 2020-06-04 DIAGNOSIS — R778 Other specified abnormalities of plasma proteins: Secondary | ICD-10-CM | POA: Diagnosis not present

## 2020-06-04 DIAGNOSIS — E872 Acidosis: Secondary | ICD-10-CM | POA: Diagnosis not present

## 2020-06-04 DIAGNOSIS — G9340 Encephalopathy, unspecified: Secondary | ICD-10-CM

## 2020-06-04 DIAGNOSIS — R0603 Acute respiratory distress: Secondary | ICD-10-CM | POA: Diagnosis not present

## 2020-06-04 DIAGNOSIS — K4090 Unilateral inguinal hernia, without obstruction or gangrene, not specified as recurrent: Secondary | ICD-10-CM | POA: Diagnosis not present

## 2020-06-04 DIAGNOSIS — K8020 Calculus of gallbladder without cholecystitis without obstruction: Secondary | ICD-10-CM | POA: Diagnosis present

## 2020-06-04 DIAGNOSIS — I5021 Acute systolic (congestive) heart failure: Secondary | ICD-10-CM | POA: Diagnosis present

## 2020-06-04 DIAGNOSIS — J181 Lobar pneumonia, unspecified organism: Secondary | ICD-10-CM | POA: Diagnosis present

## 2020-06-04 DIAGNOSIS — I119 Hypertensive heart disease without heart failure: Secondary | ICD-10-CM | POA: Diagnosis not present

## 2020-06-04 DIAGNOSIS — J9690 Respiratory failure, unspecified, unspecified whether with hypoxia or hypercapnia: Secondary | ICD-10-CM

## 2020-06-04 DIAGNOSIS — I4819 Other persistent atrial fibrillation: Secondary | ICD-10-CM | POA: Diagnosis not present

## 2020-06-04 DIAGNOSIS — I469 Cardiac arrest, cause unspecified: Secondary | ICD-10-CM | POA: Diagnosis not present

## 2020-06-04 DIAGNOSIS — Z91040 Latex allergy status: Secondary | ICD-10-CM

## 2020-06-04 DIAGNOSIS — N1831 Chronic kidney disease, stage 3a: Secondary | ICD-10-CM | POA: Diagnosis not present

## 2020-06-04 DIAGNOSIS — I361 Nonrheumatic tricuspid (valve) insufficiency: Secondary | ICD-10-CM | POA: Diagnosis not present

## 2020-06-04 DIAGNOSIS — I10 Essential (primary) hypertension: Secondary | ICD-10-CM | POA: Diagnosis present

## 2020-06-04 DIAGNOSIS — I480 Paroxysmal atrial fibrillation: Secondary | ICD-10-CM | POA: Diagnosis not present

## 2020-06-04 DIAGNOSIS — J189 Pneumonia, unspecified organism: Secondary | ICD-10-CM | POA: Diagnosis not present

## 2020-06-04 DIAGNOSIS — C7951 Secondary malignant neoplasm of bone: Secondary | ICD-10-CM | POA: Diagnosis present

## 2020-06-04 DIAGNOSIS — Z9079 Acquired absence of other genital organ(s): Secondary | ICD-10-CM

## 2020-06-04 DIAGNOSIS — E860 Dehydration: Secondary | ICD-10-CM | POA: Diagnosis present

## 2020-06-04 DIAGNOSIS — R569 Unspecified convulsions: Secondary | ICD-10-CM | POA: Diagnosis not present

## 2020-06-04 DIAGNOSIS — B370 Candidal stomatitis: Secondary | ICD-10-CM | POA: Diagnosis not present

## 2020-06-04 DIAGNOSIS — K529 Noninfective gastroenteritis and colitis, unspecified: Secondary | ICD-10-CM

## 2020-06-04 DIAGNOSIS — R4020 Unspecified coma: Secondary | ICD-10-CM | POA: Diagnosis not present

## 2020-06-04 DIAGNOSIS — R197 Diarrhea, unspecified: Secondary | ICD-10-CM

## 2020-06-04 DIAGNOSIS — Z4659 Encounter for fitting and adjustment of other gastrointestinal appliance and device: Secondary | ICD-10-CM

## 2020-06-04 DIAGNOSIS — I4892 Unspecified atrial flutter: Secondary | ICD-10-CM | POA: Diagnosis present

## 2020-06-04 DIAGNOSIS — J9602 Acute respiratory failure with hypercapnia: Secondary | ICD-10-CM | POA: Diagnosis present

## 2020-06-04 DIAGNOSIS — Z8679 Personal history of other diseases of the circulatory system: Secondary | ICD-10-CM | POA: Diagnosis not present

## 2020-06-04 DIAGNOSIS — I130 Hypertensive heart and chronic kidney disease with heart failure and stage 1 through stage 4 chronic kidney disease, or unspecified chronic kidney disease: Secondary | ICD-10-CM | POA: Diagnosis present

## 2020-06-04 DIAGNOSIS — R140 Abdominal distension (gaseous): Secondary | ICD-10-CM | POA: Diagnosis not present

## 2020-06-04 DIAGNOSIS — I2510 Atherosclerotic heart disease of native coronary artery without angina pectoris: Secondary | ICD-10-CM | POA: Diagnosis present

## 2020-06-04 DIAGNOSIS — Z20822 Contact with and (suspected) exposure to covid-19: Secondary | ICD-10-CM | POA: Diagnosis present

## 2020-06-04 DIAGNOSIS — A414 Sepsis due to anaerobes: Secondary | ICD-10-CM | POA: Diagnosis present

## 2020-06-04 DIAGNOSIS — Z809 Family history of malignant neoplasm, unspecified: Secondary | ICD-10-CM

## 2020-06-04 DIAGNOSIS — I4901 Ventricular fibrillation: Secondary | ICD-10-CM | POA: Diagnosis present

## 2020-06-04 DIAGNOSIS — E878 Other disorders of electrolyte and fluid balance, not elsewhere classified: Secondary | ICD-10-CM | POA: Diagnosis not present

## 2020-06-04 DIAGNOSIS — R000 Tachycardia, unspecified: Secondary | ICD-10-CM | POA: Diagnosis not present

## 2020-06-04 DIAGNOSIS — K219 Gastro-esophageal reflux disease without esophagitis: Secondary | ICD-10-CM | POA: Diagnosis not present

## 2020-06-04 DIAGNOSIS — A0472 Enterocolitis due to Clostridium difficile, not specified as recurrent: Secondary | ICD-10-CM | POA: Diagnosis not present

## 2020-06-04 DIAGNOSIS — G9341 Metabolic encephalopathy: Secondary | ICD-10-CM | POA: Diagnosis not present

## 2020-06-04 DIAGNOSIS — Z452 Encounter for adjustment and management of vascular access device: Secondary | ICD-10-CM

## 2020-06-04 DIAGNOSIS — R338 Other retention of urine: Secondary | ICD-10-CM | POA: Diagnosis not present

## 2020-06-04 DIAGNOSIS — R0600 Dyspnea, unspecified: Secondary | ICD-10-CM

## 2020-06-04 DIAGNOSIS — E871 Hypo-osmolality and hyponatremia: Secondary | ICD-10-CM | POA: Diagnosis present

## 2020-06-04 DIAGNOSIS — I9589 Other hypotension: Secondary | ICD-10-CM

## 2020-06-04 DIAGNOSIS — I6503 Occlusion and stenosis of bilateral vertebral arteries: Secondary | ICD-10-CM | POA: Diagnosis not present

## 2020-06-04 DIAGNOSIS — R509 Fever, unspecified: Secondary | ICD-10-CM | POA: Diagnosis not present

## 2020-06-04 DIAGNOSIS — J90 Pleural effusion, not elsewhere classified: Secondary | ICD-10-CM | POA: Diagnosis not present

## 2020-06-04 DIAGNOSIS — R739 Hyperglycemia, unspecified: Secondary | ICD-10-CM | POA: Diagnosis present

## 2020-06-04 DIAGNOSIS — Z7901 Long term (current) use of anticoagulants: Secondary | ICD-10-CM

## 2020-06-04 DIAGNOSIS — C259 Malignant neoplasm of pancreas, unspecified: Secondary | ICD-10-CM | POA: Diagnosis present

## 2020-06-04 DIAGNOSIS — M1990 Unspecified osteoarthritis, unspecified site: Secondary | ICD-10-CM | POA: Diagnosis present

## 2020-06-04 DIAGNOSIS — I700 Atherosclerosis of aorta: Secondary | ICD-10-CM | POA: Diagnosis present

## 2020-06-04 DIAGNOSIS — K5100 Ulcerative (chronic) pancolitis without complications: Secondary | ICD-10-CM | POA: Diagnosis not present

## 2020-06-04 DIAGNOSIS — Z466 Encounter for fitting and adjustment of urinary device: Secondary | ICD-10-CM | POA: Diagnosis not present

## 2020-06-04 DIAGNOSIS — K7200 Acute and subacute hepatic failure without coma: Secondary | ICD-10-CM | POA: Diagnosis not present

## 2020-06-04 DIAGNOSIS — E8809 Other disorders of plasma-protein metabolism, not elsewhere classified: Secondary | ICD-10-CM | POA: Diagnosis present

## 2020-06-04 DIAGNOSIS — Z8249 Family history of ischemic heart disease and other diseases of the circulatory system: Secondary | ICD-10-CM

## 2020-06-04 DIAGNOSIS — R531 Weakness: Secondary | ICD-10-CM | POA: Diagnosis not present

## 2020-06-04 DIAGNOSIS — Z515 Encounter for palliative care: Secondary | ICD-10-CM | POA: Diagnosis not present

## 2020-06-04 DIAGNOSIS — C61 Malignant neoplasm of prostate: Secondary | ICD-10-CM | POA: Diagnosis present

## 2020-06-04 DIAGNOSIS — Z01818 Encounter for other preprocedural examination: Secondary | ICD-10-CM

## 2020-06-04 DIAGNOSIS — Z7952 Long term (current) use of systemic steroids: Secondary | ICD-10-CM

## 2020-06-04 DIAGNOSIS — R40243 Glasgow coma scale score 3-8, unspecified time: Secondary | ICD-10-CM | POA: Diagnosis not present

## 2020-06-04 DIAGNOSIS — R918 Other nonspecific abnormal finding of lung field: Secondary | ICD-10-CM | POA: Diagnosis not present

## 2020-06-04 DIAGNOSIS — I959 Hypotension, unspecified: Secondary | ICD-10-CM | POA: Diagnosis present

## 2020-06-04 DIAGNOSIS — Z79899 Other long term (current) drug therapy: Secondary | ICD-10-CM

## 2020-06-04 DIAGNOSIS — Z882 Allergy status to sulfonamides status: Secondary | ICD-10-CM

## 2020-06-04 LAB — CBC WITH DIFFERENTIAL/PLATELET
Abs Immature Granulocytes: 0.12 10*3/uL — ABNORMAL HIGH (ref 0.00–0.07)
Basophils Absolute: 0.1 10*3/uL (ref 0.0–0.1)
Basophils Relative: 1 %
Eosinophils Absolute: 0 10*3/uL (ref 0.0–0.5)
Eosinophils Relative: 0 %
HCT: 40.9 % (ref 39.0–52.0)
Hemoglobin: 13.2 g/dL (ref 13.0–17.0)
Immature Granulocytes: 1 %
Lymphocytes Relative: 20 %
Lymphs Abs: 2.7 10*3/uL (ref 0.7–4.0)
MCH: 32.1 pg (ref 26.0–34.0)
MCHC: 32.3 g/dL (ref 30.0–36.0)
MCV: 99.5 fL (ref 80.0–100.0)
Monocytes Absolute: 0.9 10*3/uL (ref 0.1–1.0)
Monocytes Relative: 7 %
Neutro Abs: 9.3 10*3/uL — ABNORMAL HIGH (ref 1.7–7.7)
Neutrophils Relative %: 71 %
Platelets: 314 10*3/uL (ref 150–400)
RBC: 4.11 MIL/uL — ABNORMAL LOW (ref 4.22–5.81)
RDW: 13.8 % (ref 11.5–15.5)
WBC Morphology: INCREASED
WBC: 13.1 10*3/uL — ABNORMAL HIGH (ref 4.0–10.5)
nRBC: 0 % (ref 0.0–0.2)

## 2020-06-04 LAB — COMPREHENSIVE METABOLIC PANEL
ALT: 13 U/L (ref 0–44)
AST: 17 U/L (ref 15–41)
Albumin: 3 g/dL — ABNORMAL LOW (ref 3.5–5.0)
Alkaline Phosphatase: 50 U/L (ref 38–126)
Anion gap: 12 (ref 5–15)
BUN: 23 mg/dL (ref 8–23)
CO2: 23 mmol/L (ref 22–32)
Calcium: 8.9 mg/dL (ref 8.9–10.3)
Chloride: 99 mmol/L (ref 98–111)
Creatinine, Ser: 1.51 mg/dL — ABNORMAL HIGH (ref 0.61–1.24)
GFR, Estimated: 47 mL/min — ABNORMAL LOW (ref >60)
Glucose, Bld: 137 mg/dL — ABNORMAL HIGH (ref 70–99)
Potassium: 3.5 mmol/L (ref 3.5–5.1)
Sodium: 134 mmol/L — ABNORMAL LOW (ref 135–145)
Total Bilirubin: 1 mg/dL (ref 0.3–1.2)
Total Protein: 6.3 g/dL — ABNORMAL LOW (ref 6.5–8.1)

## 2020-06-04 LAB — RESPIRATORY PANEL BY RT PCR (FLU A&B, COVID)
Influenza A by PCR: NEGATIVE
Influenza B by PCR: NEGATIVE
SARS Coronavirus 2 by RT PCR: NEGATIVE

## 2020-06-04 LAB — APTT: aPTT: 33 seconds (ref 24–36)

## 2020-06-04 LAB — C DIFFICILE QUICK SCREEN W PCR REFLEX
C Diff antigen: POSITIVE — AB
C Diff interpretation: DETECTED
C Diff toxin: POSITIVE — AB

## 2020-06-04 LAB — PROTIME-INR
INR: 1.3 — ABNORMAL HIGH (ref 0.8–1.2)
Prothrombin Time: 15.3 seconds — ABNORMAL HIGH (ref 11.4–15.2)

## 2020-06-04 LAB — LACTIC ACID, PLASMA: Lactic Acid, Venous: 1.9 mmol/L (ref 0.5–1.9)

## 2020-06-04 MED ORDER — SODIUM CHLORIDE 0.9 % IV BOLUS
1000.0000 mL | Freq: Once | INTRAVENOUS | Status: AC
  Administered 2020-06-04: 1000 mL via INTRAVENOUS

## 2020-06-04 MED ORDER — SODIUM CHLORIDE 0.9 % IV SOLN
2.0000 g | Freq: Once | INTRAVENOUS | Status: AC
  Administered 2020-06-04: 2 g via INTRAVENOUS
  Filled 2020-06-04: qty 20

## 2020-06-04 MED ORDER — IOHEXOL 300 MG/ML  SOLN
75.0000 mL | Freq: Once | INTRAMUSCULAR | Status: AC | PRN
  Administered 2020-06-04: 75 mL via INTRAVENOUS

## 2020-06-04 MED ORDER — METRONIDAZOLE IN NACL 5-0.79 MG/ML-% IV SOLN
500.0000 mg | Freq: Once | INTRAVENOUS | Status: AC
  Administered 2020-06-04: 500 mg via INTRAVENOUS
  Filled 2020-06-04: qty 100

## 2020-06-04 MED ORDER — SODIUM CHLORIDE 0.9 % IV BOLUS
2000.0000 mL | Freq: Once | INTRAVENOUS | Status: AC
  Administered 2020-06-04: 2000 mL via INTRAVENOUS

## 2020-06-04 MED ORDER — CIPROFLOXACIN IN D5W 400 MG/200ML IV SOLN
400.0000 mg | Freq: Once | INTRAVENOUS | Status: AC
  Administered 2020-06-04: 400 mg via INTRAVENOUS
  Filled 2020-06-04: qty 200

## 2020-06-04 MED ORDER — METOPROLOL TARTRATE 5 MG/5ML IV SOLN
2.5000 mg | Freq: Once | INTRAVENOUS | Status: AC
  Administered 2020-06-04: 2.5 mg via INTRAVENOUS
  Filled 2020-06-04: qty 5

## 2020-06-04 MED ORDER — AMIODARONE HCL IN DEXTROSE 360-4.14 MG/200ML-% IV SOLN
INTRAVENOUS | Status: AC
  Administered 2020-06-05: 60 mg/h
  Filled 2020-06-04: qty 200

## 2020-06-04 NOTE — Patient Instructions (Addendum)
Perform blood workup Check stool testing If persisting vomiting, altered mental status, worsening thirst or lightheadedness , please go to the ER

## 2020-06-04 NOTE — ED Provider Notes (Signed)
[facility] EMERGENCY DEPARTMENT Provider Note   CSN: [id number] Arrival date & time: 06/08/2020  1828     History Chief Complaint  Patient presents with  . Diarrhea    Samuel Gregory is a 79 y.o. male.  Patient complains of diarrhea for 10 days.  He saw his GI doctor today and was told to come to the emergency department if he was getting worse.  Patient states he feels very weak and has had a fever  The history is provided by the patient and medical records. No language interpreter was used.  Diarrhea Quality:  Copious Severity:  Moderate Onset quality:  Sudden Timing:  Constant Progression:  Worsening Relieved by:  Nothing Worsened by:  Nothing Associated symptoms: no abdominal pain and no headaches   Risk factors: no recent antibiotic use        Past Medical History:  Diagnosis Date  . Arthritis   . Atrial flutter ([place])    a. diagnosed in 12/2019 while admitted for sepsis  . Collagen vascular disease ([place])   . Coronary atherosclerosis of native coronary artery    a. s/p CABG in 2000 b. NST in 10/2017 showing mild to moderate per-infarct ischemia with medical management recommended  . Essential hypertension   . Hyperlipidemia   . Prostate cancer [facility])     Patient Active Problem List   Diagnosis Date Noted  . Acute diarrhea 06/15/2020  . Pancreatic lesion 03/02/2020  . Liver tumor (benign) 03/02/2020  . GERD (gastroesophageal reflux disease) 03/02/2020  . Enteritis due to Clostridium difficile   . Acute diverticulitis 02/04/2020  . Acute midline low back pain with bilateral sciatica   . AKI (acute kidney injury) ([place])   . Atrial flutter with rapid ventricular response ([place]) 01/23/2020  . Goals of care, counseling/discussion   . Palliative care by specialist   . DNR (do not resuscitate) discussion   . Hypotension 01/21/2020  . Abdominal pain-Pancreatic and liver masses 01/21/2020  . Severe sepsis with septic shock ([place]) 01/21/2020  . Nocturia 11/20/2019  .  Bone metastases ([place]) 12/25/2018  . Male hypogonadism 12/25/2018  . Prostate cancer --with bony metastases, status post prior prostatectomy in 2004 12/25/2018  . Fusion of spine of lumbosacral region 12/19/2017  . Essential hypertension 01/23/2013  . Coronary atherosclerosis of native coronary artery 01/23/2013  . Mixed hyperlipidemia 01/23/2013    Past Surgical History:  Procedure Laterality Date  . APPENDECTOMY     8 yrs ago- Dr Geroge Baseman  . CATARACT EXTRACTION W/PHACO Left 07/14/2014   Procedure: CATARACT EXTRACTION PHACO AND INTRAOCULAR LENS PLACEMENT (IOC);  Surgeon: Tonny Branch, MD;  Location: [facility] ORS;  Service: Ophthalmology;  Laterality: Left;  CDE:6.20  . CATARACT EXTRACTION W/PHACO Right 08/11/2014   Procedure: CATARACT EXTRACTION PHACO AND INTRAOCULAR LENS PLACEMENT RIGHT EYE;  Surgeon: Tonny Branch, MD;  Location: [facility] ORS;  Service: Ophthalmology;  Laterality: Right;  CDE:10.11  . COLONOSCOPY N/A 01/14/2015   Procedure: COLONOSCOPY;  Surgeon: Rogene Houston, MD;  Location: [facility] ENDO SUITE;  Service: Endoscopy;  Laterality: N/A;  730  . CORONARY ARTERY BYPASS GRAFT  01/1999   LIMA to LAD, SVG to diagonal, sequential SVG to PDA and PLB  . PROSTATECTOMY  2004   [name]  . TONSILLECTOMY     age 10       Family History  Problem Relation Age of Onset  . Heart attack Mother   . Cancer Mother   . Aneurysm Father     Social History  Tobacco Use  . Smoking status: Never Smoker  . Smokeless tobacco: Never Used  Vaping Use  . Vaping Use: Never used  Substance Use Topics  . Alcohol use: Yes    Alcohol/week: 0.0 standard drinks    Comment: daily-glass of wine  . Drug use: No    Home Medications Prior to Admission medications   Medication Sig Start Date End Date Taking? Authorizing Provider  acetaminophen (TYLENOL) 325 MG tablet Take 2 tablets (650 mg total) by mouth every 6 (six) hours as needed for mild pain, fever or headache (or Fever >/= 101). 02/06/20   Emokpae,  Courage, MD  apixaban (ELIQUIS) 5 MG TABS tablet Take 1 tablet (5 mg total) by mouth 2 (two) times daily. 03/31/20 03/26/21  Erma Heritage, PA-C  b complex vitamins capsule Take 1 capsule by mouth daily.    [provider]  Calcium-Magnesium-Vitamin D (CALCIUM 1200+D3 PO) Take 1 tablet by mouth daily.     [provider]  cholecalciferol (VITAMIN D3) 25 MCG (1000 UNIT) tablet Take 1,000 Units by mouth daily.     [provider]  fluticasone (FLONASE) 50 MCG/ACT nasal spray Place 1 spray into both nostrils daily as needed for allergies or rhinitis.    [provider]  metoprolol tartrate (LOPRESSOR) 25 MG tablet Take 75 mg by mouth 2 (two) times daily.  04/27/20   [provider]  Multiple Vitamin (MULTIVITAMIN WITH MINERALS) TABS Take 1 tablet by mouth daily.    [provider]  nitroGLYCERIN (NITROSTAT) 0.4 MG SL tablet Place 0.4 mg under the tongue every 5 (five) minutes as needed for chest pain.    [provider]  predniSONE (DELTASONE) 5 MG tablet Take 5 mg by mouth 2 (two) times daily with a meal. Take with cancer medication.    [provider]  rosuvastatin (CRESTOR) 40 MG tablet Take 1 tablet (40 mg total) by mouth daily after supper. 11/29/19 03/31/20  Satira Sark, MD  rosuvastatin (CRESTOR) 40 MG tablet Take 40 mg by mouth daily.    [provider]  Thiamine HCl (VITAMIN B-1 PO) Take 1 tablet by mouth every morning.  Patient not taking: Reported on 06/20/2020    [provider]  triamcinolone cream (KENALOG) 0.1 % Apply topically. 05/01/20   [provider]  ZYTIGA 250 MG tablet Take 4 tablets by mouth every morning.  09/20/16   [provider]    Allergies    Latex and Sulfa antibiotics  Review of Systems   Review of Systems  Constitutional: Negative for appetite change and fatigue.  HENT: Negative for congestion, ear discharge and sinus pressure.   Eyes: Negative for  discharge.  Respiratory: Negative for cough.   Cardiovascular: Negative for chest pain.  Gastrointestinal: Positive for diarrhea. Negative for abdominal pain.  Genitourinary: Negative for frequency and hematuria.  Musculoskeletal: Negative for back pain.  Skin: Negative for rash.  Neurological: Negative for seizures and headaches.  Psychiatric/Behavioral: Negative for hallucinations.    Physical Exam Updated Vital Signs BP 133/84   Pulse (!) 144 Comment: pt up using bsc  Temp 100.3 F (37.9 C) (Oral)   Resp (!) 22   SpO2 98%   Physical Exam Vitals and nursing note reviewed.  Constitutional:      Appearance: He is well-developed.  HENT:     Head: Normocephalic.     Nose: Nose normal.  Eyes:     General: No scleral icterus.    Conjunctiva/sclera: Conjunctivae normal.  Neck:     Thyroid: No thyromegaly.  Cardiovascular:     Rate and Rhythm: Regular rhythm. Tachycardia present.     Heart sounds: No murmur heard.  No friction rub. No gallop.   Pulmonary:     Breath sounds: No stridor. No wheezing or rales.  Chest:     Chest wall: No tenderness.  Abdominal:     General: There is no distension.     Tenderness: There is no abdominal tenderness. There is no rebound.  Musculoskeletal:        General: Normal range of motion.     Cervical back: Neck supple.  Lymphadenopathy:     Cervical: No cervical adenopathy.  Skin:    Findings: No erythema or rash.  Neurological:     Mental Status: He is oriented to person, place, and time.     Motor: No abnormal muscle tone.     Coordination: Coordination normal.  Psychiatric:        Behavior: Behavior normal.     ED Results / Procedures / Treatments   Labs (all labs ordered are listed, but only abnormal results are displayed) Labs Reviewed  COMPREHENSIVE METABOLIC PANEL - Abnormal; Notable for the following components:      Result Value   Sodium 134 (*)    Glucose, Bld 137 (*)    Creatinine, Ser 1.51 (*)    Total Protein  6.3 (*)    Albumin 3.0 (*)    GFR, Estimated 47 (*)    All other components within normal limits  CBC WITH DIFFERENTIAL/PLATELET - Abnormal; Notable for the following components:   WBC 13.1 (*)    RBC 4.11 (*)    Neutro Abs 9.3 (*)    Abs Immature Granulocytes 0.12 (*)    All other components within normal limits  PROTIME-INR - Abnormal; Notable for the following components:   Prothrombin Time 15.3 (*)    INR 1.3 (*)    All other components within normal limits  CULTURE, BLOOD (ROUTINE X 2)  CULTURE, BLOOD (ROUTINE X 2)  RESPIRATORY PANEL BY RT PCR (FLU A&B, COVID)  GASTROINTESTINAL PANEL BY PCR, STOOL (REPLACES STOOL CULTURE)  C DIFFICILE QUICK SCREEN W PCR REFLEX  LACTIC ACID, PLASMA  APTT  LACTIC ACID, PLASMA    EKG None  Radiology CT ABDOMEN PELVIS W CONTRAST  Result Date: 06/11/2020 CLINICAL DATA:  Abdominal distension, diarrhea X 10 days. Told by GI that if it got worse to go to the ED. EXAM: CT ABDOMEN AND PELVIS WITH CONTRAST TECHNIQUE: Multidetector CT imaging of the abdomen and pelvis was performed using the standard protocol following bolus administration of intravenous contrast. CONTRAST:  80mL OMNIPAQUE IOHEXOL 300 MG/ML  SOLN COMPARISON:  MRI abdomen 01/21/2020, CT abdomen pelvis 01/05/2020 FINDINGS: Lower chest: No acute abnormality. Four-vessel coronary artery calcifications status post coronary artery bypass. Hepatobiliary: No focal liver abnormality. Several calcified gallstones are noted within the gallbladder lumen. Otherwise no gallbladder wall thickening or pericholecystic fluid. No biliary dilatation. Pancreas: Redemonstration of a stable 1.5 x 1.2 pancreatic head/uncinate process lesion that was consistent with a in intraductal mucinous papillary neoplasm on MRI abdomen 01/21/2020. No other pancreatic lesion identified. Normal pancreatic contour. No surrounding inflammatory changes. No main pancreatic ductal dilatation. Spleen: Normal in size. Associated  calcified density likely represents sequelae of prior granulomatous disease. Suggestion of a splenule is again noted (2:28). Adrenals/Urinary Tract: No adrenal nodule bilaterally. Nonspecific bilateral perinephric stranding. Bilateral kidneys enhance symmetrically. No hydronephrosis. No hydroureter. The urinary bladder  is unremarkable. Stomach/Bowel: Stomach is within normal limits. No evidence of small bowel wall thickening or dilatation. Extensive large bowel wall circumferential thickening and pericolonic fat stranding that is much more prominent of the rectosigmoid colon. No large bowel dilatation. No pneumatosis. Appendix appears normal. Vascular/Lymphatic: No abdominal aorta or iliac aneurysm. Focal dilatation of the infrarenal abdominal aorta with a caliber of 2.6 cm (5:55). Atherosclerotic plaque of the aorta and its branches. No abdominal, pelvic, or inguinal lymphadenopathy. Reproductive: Prostate resection. Surgical clips associated with the right testicle likely represents vasectomy. Other: Trace fluid within the abdomen and pelvis. No intraperitoneal free gas. No organized fluid collection. Musculoskeletal: Small fat and fluid containing right inguinal hernia. Possible tiny fat containing supraumbilical hernia (1:61). Diffusely decreased bone density. Posterolateral fusion of the L4 through S1 levels with inter pedicular screw and rod fixation as well as interbody cages. Multilevel severe degenerative changes of the spine with intervertebral vacuum phenomenon. No suspicious lytic or blastic osseous lesions. No acute displaced fracture. Multilevel degenerative changes of the spine. IMPRESSION: 1. Marked pancolitis that is worse of the rectosigmoid colon. Etiology could be infectious, inflammatory, ischemic. No associated bowel obstruction or perforation. Correlate with stool cultures. 2. Other imaging findings of potential clinical significance: Cholelithiasis with no acute cholecystitis. Stable 1.5 cm  pancreatic head intraductal papillary mucinous neoplasm. Small right inguinal hernia containing fat and fluid. Aortic Atherosclerosis (ICD10-I70.0). Electronically Signed   By: Iven Finn M.D.   On: 06/12/2020 22:57   DG Chest Port 1 View  Result Date: 06/18/2020 CLINICAL DATA:  Code sepsis.  Fever, weakness, diarrhea EXAM: PORTABLE CHEST 1 VIEW COMPARISON:  Radiograph 02/04/2020.  CT 04/08/2016 FINDINGS: Post median sternotomy and CABG. Coronary stent also visualized. Heart is normal in size aortic atherosclerosis. Mild elevation of right hemidiaphragm. There are ill-defined streaky opacities at both lung bases. No pulmonary edema. No confluent consolidation. No pleural fluid or pneumothorax. The bones are under mineralized without acute osseous abnormalities. IMPRESSION: 1. Ill-defined streaky opacities at both lung bases, favor atelectasis over pneumonia. 2. Post CABG. Normal heart size. Aortic Atherosclerosis (ICD10-I70.0). Electronically Signed   By: Keith Rake M.D.   On: 06/13/2020 20:31    Procedures Procedures (including critical care time)  Medications Ordered in ED Medications  ciprofloxacin (CIPRO) IVPB 400 mg (has no administration in time range)  sodium chloride 0.9 % bolus 1,000 mL (has no administration in time range)  metoprolol tartrate (LOPRESSOR) injection 2.5 mg (has no administration in time range)  cefTRIAXone (ROCEPHIN) 2 g in sodium chloride 0.9 % 100 mL IVPB (2 g Intravenous New Bag/Given 06/20/2020 2121)  metroNIDAZOLE (FLAGYL) IVPB 500 mg (500 mg Intravenous New Bag/Given 06/06/2020 2118)  sodium chloride 0.9 % bolus 2,000 mL (2,000 mLs Intravenous New Bag/Given 06/08/2020 2114)  iohexol (OMNIPAQUE) 300 MG/ML solution 75 mL (75 mLs Intravenous Contrast Given 06/30/2020 2221)    ED Course  I have reviewed the triage vital signs and the nursing notes.  Pertinent labs & imaging results that were available during my care of the patient were reviewed by me and considered  in my medical decision making (see chart for details). After patient was admitted he went into V. fib had to be shocked twice started on amiodarone and intubated.   CRITICAL CARE Performed by: Milton Ferguson Total critical care time: 45 minutes Critical care time was exclusive of separately billable procedures and treating other patients. Critical care was necessary to treat or prevent imminent or life-threatening deterioration. Critical care was time spent personally  by me on the following activities: development of treatment plan with patient and/or surrogate as well as nursing, discussions with consultants, evaluation of patient's response to treatment, examination of patient, obtaining history from patient or surrogate, ordering and performing treatments and interventions, ordering and review of laboratory studies, ordering and review of radiographic studies, pulse oximetry and re-evaluation of patient's condition.  MDM Rules/Calculators/A&P                         Patient with pancolitis.  Dehydration.  He will be admitted to medicine and have GI consult        final Clinical Impression(s) / ED Diagnoses Final diagnoses:  Colitis    Rx / DC Orders ED Discharge Orders    None       Milton Ferguson, MD 06/05/20 0006

## 2020-06-04 NOTE — ED Triage Notes (Signed)
Pt reports having diarrhea x 10 days. Pt saw GI this afternoon and was told if "I get worse to come to the ER." Pt also reports abd pain and possible dehydration.

## 2020-06-04 NOTE — Progress Notes (Signed)
Following for Code Sepsis  

## 2020-06-04 NOTE — Progress Notes (Signed)
[name], M.D. Gastroenterology & Hepatology [facility] [address] [place], [place] [address]  Primary Care Physician: Celene Squibb, MD [place] [id number]  I will communicate my assessment and recommendations to the referring MD via EMR. "Note: Occasional unusual wording and randomly placed punctuation marks may result from the use of speech recognition technology to transcribe this document"  Problems: 1. Acute diarrhea 2. History of C. difficile infection status post vancomycin treatment x1  History of Present Illness: Samuel Gregory is a 79 y.o. male with past medical history of atrial fibrillation on Eliquis, coronary artery disease status post CABG in 2000, hypertension, hyperlipidemia, GERD, HLD, metastatic prostate cancer and history of C. difficile infection x1, who presents for evaluation of acute onset of diarrhea.  Patient reports he ate turnip greens for 3 days in a row and states that on the third day he developed new onset of diarrhea. States that during the last 10 days he has had 3-6 watery bowel movements daily, even though he has been taking antidiarrheals OTC -his wife believes its not Imodium but another antidiarrheal. States today his BMs are watery but they were soft the previous days.  He denies seeing any fresh blood in the stool or melena. Reports having some discomfort in his lower abdomen and mild pain when he has a bowel movement. States feeling nauseated but no vomiting.  The patient states that he has been able to drink liquids adequately so far but he feels tired and with discomfort.  He has tried to drink Pedialyte for the last few days.  The patient denies taking any antibiotics recently.  Patient states that after his most recent episode of C. Diff infection he has had soft bowel movements but not watery diarrhea.  He reports that he usually has 1-2 bowel movements per day  without any abdominal discomfort.  Last Colonoscopy: 8 years ago, few polyps per the patient but no report is available  Past Medical History: Past Medical History:  Diagnosis Date  . Arthritis   . Atrial flutter ([place])    a. diagnosed in 12/2019 while admitted for sepsis  . Collagen vascular disease ([place])   . Coronary atherosclerosis of native coronary artery    a. s/p CABG in 2000 b. NST in 10/2017 showing mild to moderate per-infarct ischemia with medical management recommended  . Essential hypertension   . Hyperlipidemia   . Prostate cancer [facility])     Past Surgical History: Past Surgical History:  Procedure Laterality Date  . APPENDECTOMY     8 yrs ago- Dr Geroge Baseman  . CATARACT EXTRACTION W/PHACO Left 07/14/2014   Procedure: CATARACT EXTRACTION PHACO AND INTRAOCULAR LENS PLACEMENT (IOC);  Surgeon: Tonny Branch, MD;  Location: [facility];  Service: Ophthalmology;  Laterality: Left;  CDE:6.20  . CATARACT EXTRACTION W/PHACO Right 08/11/2014   Procedure: CATARACT EXTRACTION PHACO AND INTRAOCULAR LENS PLACEMENT RIGHT EYE;  Surgeon: Tonny Branch, MD;  Location: [facility] ORS;  Service: Ophthalmology;  Laterality: Right;  CDE:10.11  . COLONOSCOPY N/A 01/14/2015   Procedure: COLONOSCOPY;  Surgeon: Rogene Houston, MD;  Location: [facility] END[facility] SU[facility];  Service: Endoscopy;  Laterality: N/A;  730  . CORONARY ARTERY BYPASS GRAFT  01/1999   LIMA to LAD, SVG to diagonal, sequential SVG to PDA and PLB  . PROSTATECTOMY  2004   [name]  . TONSILLECTOMY     age 77    Family History: Family History  Problem  Relation Age of Onset  . Heart attack Mother   . Cancer Mother   . Aneurysm Father     Social History: Social History   Tobacco Use  Smoking Status Never Smoker  Smokeless Tobacco Never Used   Social History   Substance and Sexual Activity  Alcohol Use Yes  . Alcohol/week: 0.0 standard drinks   Comment: daily-glass of wine   Social History   Substance and Sexual Activity  Drug Use No     Allergies: Allergies  Allergen Reactions  . Latex Rash  . Sulfa Antibiotics Rash    Medications: Current Outpatient Medications  Medication Sig Dispense Refill  . acetaminophen (TYLENOL) 325 MG tablet Take 2 tablets (650 mg total) by mouth every 6 (six) hours as needed for mild pain, fever or headache (or Fever >/= 101). 12 tablet 1  . apixaban (ELIQUIS) 5 MG TABS tablet Take 1 tablet (5 mg total) by mouth 2 (two) times daily. 60 tablet 11  . b complex vitamins capsule Take 1 capsule by mouth daily.    . Calcium-Magnesium-Vitamin D (CALCIUM 1200+D3 PO) Take 1 tablet by mouth daily.     . cholecalciferol (VITAMIN D3) 25 MCG (1000 UNIT) tablet Take 1,000 Units by mouth daily.     . fluticasone (FLONASE) 50 MCG/ACT nasal spray Place 1 spray into both nostrils daily as needed for allergies or rhinitis.    . metoprolol tartrate (LOPRESSOR) 25 MG tablet Take 75 mg by mouth 2 (two) times daily.     . Multiple Vitamin (MULTIVITAMIN WITH MINERALS) TABS Take 1 tablet by mouth daily.    . nitroGLYCERIN (NITROSTAT) 0.4 MG SL tablet Place 0.4 mg under the tongue every 5 (five) minutes as needed for chest pain.    . predniSONE (DELTASONE) 5 MG tablet Take 5 mg by mouth 2 (two) times daily with a meal. Take with cancer medication.    . rosuvastatin (CRESTOR) 40 MG tablet Take 40 mg by mouth daily.    Marland Kitchen triamcinolone cream (KENALOG) 0.1 % Apply topically.    Marland Kitchen ZYTIGA 250 MG tablet Take 4 tablets by mouth every morning.   11  . rosuvastatin (CRESTOR) 40 MG tablet Take 1 tablet (40 mg total) by mouth daily after supper. 90 tablet 3  . Thiamine HCl (VITAMIN B-1 PO) Take 1 tablet by mouth every morning.  (Patient not taking: Reported on 06/18/2020)     No current facility-administered medications for this visit.    Review of Systems: GENERAL: negative for malaise, night sweats HEENT: No changes in hearing or vision, no nose bleeds or other nasal problems. NECK: Negative for lumps, goiter, pain and  significant neck swelling RESPIRATORY: Negative for cough, wheezing CARDIOVASCULAR: Negative for chest pain, leg swelling, palpitations, orthopnea GI: SEE HPI MUSCULOSKELETAL: Negative for joint pain or swelling, back pain, and muscle pain. SKIN: Negative for lesions, rash PSYCH: Negative for sleep disturbance, mood disorder and recent psychosocial stressors. HEMATOLOGY Negative for prolonged bleeding, bruising easily, and swollen nodes. ENDOCRINE: Negative for cold or heat intolerance, polyuria, polydipsia and goiter. NEURO: negative for tremor, gait imbalance, syncope and seizures. The remainder of the review of systems is noncontributory.   Physical Exam: BP 139/84 (BP Location: Right Arm, Patient Position: Sitting, Cuff Size: Normal)   Pulse (!) 105   Temp 98.5 F (36.9 C) (Oral)   Ht 5\' 6"  (1.676 m)   Wt 162 lb 12.8 oz (73.8 kg)   BMI 26.28 kg/m  GENERAL: The patient is AO x3, in  no acute distress. HEENT: Head is normocephalic and atraumatic. EOMI are intact. Mouth looks slightly dry. NECK: Supple. No masses LUNGS: Clear to auscultation. No presence of rhonchi/wheezing/rales. Adequate chest expansion HEART: Tachycardic but rhythmic, normal s1 and s2. ABDOMEN: Mildly tender upon palpation of the hypogastric area, no guarding, no peritoneal signs, and nondistended. BS +. No masses. EXTREMITIES: Without any cyanosis, clubbing, rash, lesions or edema. NEUROLOGIC: AOx3, no focal motor deficit. SKIN: no jaundice, no rashes  Imaging/Labs: as above  I personally reviewed and interpreted the available labs, imaging and endoscopic files.  Impression and Plan: KIPP SHANK is a 79 y.o. male with past medical history of atrial fibrillation on Eliquis, coronary artery disease status post CABG in 2000, hypertension, hyperlipidemia, GERD, HLD, metastatic prostate cancer and history of C. difficile infection x1, who presents for evaluation of acute onset of diarrhea.  The patient has  presented recent symptoms of diarrhea, which raise the concern for an infectious etiology given the short span of his symptoms.  Due to this, it would be important to rule out infectious sources with stool testing, especially C. difficile given his previous history of infection.  We will also check a CBC and a CMP.  Importantly, the patient was advised to keep an eye on his hydration status as he is currently presenting tachycardia and had dry mucosa.  He believes that he is able to keep up his fluid intake and if he is presenting worsening symptoms or is not able to tolerate liquids, he will need to go to the ER.  The patient can start taking antidiarrheals once his stool testing comes back negative.  - Check CBC, CMPPerform blood workup - Check C. Diff, GI path and O/P in stool -  If persisting vomiting, altered mental status, worsening thirst, inability to drink fluids or lightheadedness , patient will need to go to the ER  All questions were answered.      Harvel Quale, MD Gastroenterology and Hepatology [facility]

## 2020-06-04 NOTE — H&P (Signed)
History and Physical  Samuel Gregory ZOX:[id number] DOB: 03-12-1941 DOA: 06/18/2020  Referring physician: Milton Ferguson, MD PCP: Celene Squibb, MD  Patient coming from: Home  Chief Complaint: Diarrhea  HPI: Samuel Gregory is a 79 y.o. male with medical history significant for prostate cancer with bone metastasis, coronary artery disease s/p CABG 2000, hypertension, hyperlipidemia, GERD and atrial flutter presents to the ED due to 10-day onset of diarrhea, he went to his gastroenterologist (Dr. Montez Morita) earlier today where his diarrhea was suspected to be possibly due to infectious origin considering prior history of C. difficile infection x1.  He was noted to be dehydrated and was encouraged on fluid intake and have asked to go to the ED if symptoms worsen.  Patient presented to the ED due to feeling weak and having an onset of fever.  ED Course:  In the emergency department, he was tachycardic and tachypneic, temperature was 100.36F.  Work-up in the ED showed leukocytosis, mild hyponatremia, hyperglycemia, BUN to creatinine 23/1.51 (baseline creatinine at 1.3-1.4).  Lactic acid was 1.9, first troponin was negative.  Magnesium 1.7, procalcitonin 2.79.  C. difficile was positive.  CT abdomen and pelvis showed mild pancolitis that is worse of the rectosigmoid colon with suspicion for infectious etiology.  Chest x-ray showed ill-defined streaky opacities at both lung bases with suspicion for atelectasis.  IV hydration per sepsis protocol was provided, she was treated with IV ceftriaxone, ciprofloxacin and metronidazole were given.  IV metoprolol 2.5 mg x 1 was given and IV amiodarone was given.  Hospitalist was asked to admit patient for further evaluation and management.  Critical note: Within few minutes of being asked to admit patient (prior to arrival at patient's bed), patient had a V. fib and went into cardiac arrest and CODE BLUE was called, ACLS started, he was shocked twice, epinephrine 1  mg x 2 was given, IV amiodarone 150 mg and 300 mg push was given and patient had a ROSC.  He was started on IV amiodarone drip.  Right IJ central line was placed by Dr. Betsey Holiday.  Chest x-ray status post intubation showed endotracheal tube tip terminating in the mid trachea, 4cm from the carina.  NG tube was placed.  IV fentanyl drip was started for sedation and patient was admitted to ICU, IV NS 2 L bolus was given with transitory improved BP. [facility] P[facility] was consulted while patient was in the ICU due to hypotension as low as 52/36 and patient was started with IV Neo-Synephrine with eventual improvement in BP.  Review of Systems: This cannot be obtained at this time due to patient's current condition  Past Medical History:  Diagnosis Date  . Arthritis   . Atrial flutter ([place])    a. diagnosed in 12/2019 while admitted for sepsis  . Collagen vascular disease ([place])   . Coronary atherosclerosis of native coronary artery    a. s/p CABG in 2000 b. NST in 10/2017 showing mild to moderate per-infarct ischemia with medical management recommended  . Essential hypertension   . Hyperlipidemia   . Prostate cancer [facility])    Past Surgical History:  Procedure Laterality Date  . APPENDECTOMY     8 yrs ago- Dr Geroge Baseman  . CATARACT EXTRACTION W/PHACO Left 07/14/2014   Procedure: CATARACT EXTRACTION PHACO AND INTRAOCULAR LENS PLACEMENT (IOC);  Surgeon: Tonny Branch, MD;  Location: [facility] OR[facility];  Service: Ophthalmology;  Laterality: Left;  CDE:6.20  . CATARACT EXTRACTION W/PHACO Right 08/11/2014   Procedure: CATARACT EXTRACTION PHACO  AND INTRAOCULAR LENS PLACEMENT RIGHT EYE;  Surgeon: Tonny Branch, MD;  Location: [facility];  Service: Ophthalmology;  Laterality: Right;  CDE:10.11  . COLONOSCOPY N/A 01/14/2015   Procedure: COLONOSCOPY;  Surgeon: Rogene Houston, MD;  Location: [facility] ENDO SUITE;  Service: Endoscopy;  Laterality: N/A;  730  . CORONARY ARTERY BYPASS GRAFT  01/1999   LIMA to LAD, SVG to diagonal, sequential SVG to  PDA and PLB  . PROSTATECTOMY  2004   [name]  . TONSILLECTOMY     age 76    Social History:  reports that he has never smoked. He has never used smokeless tobacco. He reports current alcohol use. He reports that he does not use drugs.   Allergies  Allergen Reactions  . Latex Rash  . Sulfa Antibiotics Rash    Family History  Problem Relation Age of Onset  . Heart attack Mother   . Cancer Mother   . Aneurysm Father     Prior to Admission medications   Medication Sig Start Date End Date Taking? Authorizing Provider  acetaminophen (TYLENOL) 325 MG tablet Take 2 tablets (650 mg total) by mouth every 6 (six) hours as needed for mild pain, fever or headache (or Fever >/= 101). 02/06/20   Emokpae, Courage, MD  apixaban (ELIQUIS) 5 MG TABS tablet Take 1 tablet (5 mg total) by mouth 2 (two) times daily. 03/31/20 03/26/21  Erma Heritage, PA-C  b complex vitamins capsule Take 1 capsule by mouth daily.    [provider]  Calcium-Magnesium-Vitamin D (CALCIUM 1200+D3 PO) Take 1 tablet by mouth daily.     [provider]  cholecalciferol (VITAMIN D3) 25 MCG (1000 UNIT) tablet Take 1,000 Units by mouth daily.     [provider]  fluticasone (FLONASE) 50 MCG/ACT nasal spray Place 1 spray into both nostrils daily as needed for allergies or rhinitis.    [provider]  metoprolol tartrate (LOPRESSOR) 25 MG tablet Take 75 mg by mouth 2 (two) times daily.  04/27/20   [provider]  Multiple Vitamin (MULTIVITAMIN WITH MINERALS) TABS Take 1 tablet by mouth daily.    [provider]  nitroGLYCERIN (NITROSTAT) 0.4 MG SL tablet Place 0.4 mg under the tongue every 5 (five) minutes as needed for chest pain.    [provider]  predniSONE (DELTASONE) 5 MG tablet Take 5 mg by mouth 2 (two) times daily with a meal. Take with cancer medication.    [provider]  rosuvastatin (CRESTOR) 40 MG tablet Take 1 tablet (40 mg total) by  mouth daily after supper. 11/29/19 03/31/20  Satira Sark, MD  rosuvastatin (CRESTOR) 40 MG tablet Take 40 mg by mouth daily.    [provider]  Thiamine HCl (VITAMIN B-1 PO) Take 1 tablet by mouth every morning.  Patient not taking: Reported on 06/01/2020    [provider]  triamcinolone cream (KENALOG) 0.1 % Apply topically. 05/01/20   [provider]  ZYTIGA 250 MG tablet Take 4 tablets by mouth every morning.  09/20/16   [provider]    Physical Exam: BP 127/70   Pulse (!) 130   Temp 99.1 F (37.3 C) (Axillary)   Resp (!) 26   Ht 5' 6"  (1.676 m)   Wt 76.8 kg   SpO2 93%   BMI 27.33 kg/m   . General: 79 y.o. year-old male intubated and sedated but in no acute distress.   Marland Kitchen HEENT: Normocephalic, PERRL . Neck: Supple,  trachea midline . Cardiovascular: Tachycardic.  Regular rate and rhythm with no rubs or gallops.  No thyromegaly or JVD noted.  Marland Kitchen Respiratory: Tachypnea.  Clear to auscultation with no wheezes or rales. Good inspiratory effort. . Abdomen: Soft, mildly distended (improved status post NG tube insertion).  Normal bowel sounds x4 quadrants. . Muskuloskeletal: No cyanosis or clubbing noted bilaterally . Neuro: This cannot be obtained at this time due to patient being intubated and sedated  . Skin: No ulcerative lesions noted or rashes Psychiatry:  This cannot be obtained at this time due to patient being intubated and sedated          Labs on Admission:  Basic Metabolic Panel: Recent Labs  Lab 06/24/2020 2016 06/05/20 0234  NA 134* 137  K 3.5 3.2*  CL 99 107  CO2 23 18*  GLUCOSE 137* 131*  BUN 23 21  CREATININE 1.51* 1.79*  CALCIUM 8.9 7.5*  MG  --  1.7  PHOS  --  3.6   Liver Function Tests: Recent Labs  Lab 06/01/2020 2016 06/05/20 0234  AST 17 57*  ALT 13 37  ALKPHOS 50 50  BILITOT 1.0 0.6  PROT 6.3* 4.7*  ALBUMIN 3.0* 2.2*   No results for input(s): LIPASE, AMYLASE in the last 168 hours. No results for  input(s): AMMONIA in the last 168 hours. CBC: Recent Labs  Lab 06/28/2020 2016  WBC 13.1*  NEUTROABS 9.3*  HGB 13.2  HCT 40.9  MCV 99.5  PLT 314   Cardiac Enzymes: No results for input(s): CKTOTAL, CKMB, CKMBINDEX, TROPONINI in the last 168 hours.  BNP (last 3 results) No results for input(s): BNP in the last 8760 hours.  ProBNP (last 3 results) No results for input(s): PROBNP in the last 8760 hours.  CBG: Recent Labs  Lab 06/05/20 0750  GLUCAP 135*    Radiological Exams on Admission: CT ABDOMEN PELVIS W CONTRAST  Result Date: 06/02/2020 CLINICAL DATA:  Abdominal distension, diarrhea X 10 days. Told by GI that if it got worse to go to the ED. EXAM: CT ABDOMEN AND PELVIS WITH CONTRAST TECHNIQUE: Multidetector CT imaging of the abdomen and pelvis was performed using the standard protocol following bolus administration of intravenous contrast. CONTRAST:  47m OMNIPAQUE IOHEXOL 300 MG/ML  SOLN COMPARISON:  MRI abdomen 01/21/2020, CT abdomen pelvis 01/05/2020 FINDINGS: Lower chest: No acute abnormality. Four-vessel coronary artery calcifications status post coronary artery bypass. Hepatobiliary: No focal liver abnormality. Several calcified gallstones are noted within the gallbladder lumen. Otherwise no gallbladder wall thickening or pericholecystic fluid. No biliary dilatation. Pancreas: Redemonstration of a stable 1.5 x 1.2 pancreatic head/uncinate process lesion that was consistent with a in intraductal mucinous papillary neoplasm on MRI abdomen 01/21/2020. No other pancreatic lesion identified. Normal pancreatic contour. No surrounding inflammatory changes. No main pancreatic ductal dilatation. Spleen: Normal in size. Associated calcified density likely represents sequelae of prior granulomatous disease. Suggestion of a splenule is again noted (2:28). Adrenals/Urinary Tract: No adrenal nodule bilaterally. Nonspecific bilateral perinephric stranding. Bilateral kidneys enhance  symmetrically. No hydronephrosis. No hydroureter. The urinary bladder is unremarkable. Stomach/Bowel: Stomach is within normal limits. No evidence of small bowel wall thickening or dilatation. Extensive large bowel wall circumferential thickening and pericolonic fat stranding that is much more prominent of the rectosigmoid colon. No large bowel dilatation. No pneumatosis. Appendix appears normal. Vascular/Lymphatic: No abdominal aorta or iliac aneurysm. Focal dilatation of the infrarenal abdominal aorta with a caliber of 2.6 cm (5:55). Atherosclerotic plaque of the aorta and its branches.  No abdominal, pelvic, or inguinal lymphadenopathy. Reproductive: Prostate resection. Surgical clips associated with the right testicle likely represents vasectomy. Other: Trace fluid within the abdomen and pelvis. No intraperitoneal free gas. No organized fluid collection. Musculoskeletal: Small fat and fluid containing right inguinal hernia. Possible tiny fat containing supraumbilical hernia (2:95). Diffusely decreased bone density. Posterolateral fusion of the L4 through S1 levels with inter pedicular screw and rod fixation as well as interbody cages. Multilevel severe degenerative changes of the spine with intervertebral vacuum phenomenon. No suspicious lytic or blastic osseous lesions. No acute displaced fracture. Multilevel degenerative changes of the spine. IMPRESSION: 1. Marked pancolitis that is worse of the rectosigmoid colon. Etiology could be infectious, inflammatory, ischemic. No associated bowel obstruction or perforation. Correlate with stool cultures. 2. Other imaging findings of potential clinical significance: Cholelithiasis with no acute cholecystitis. 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 Small right inguinal hernia containing fat and fluid. Aortic Atherosclerosis (ICD10-I70.0). Electronically Signed   By: Iven Finn M.D.   On: 06/06/2020 22:57   DG Chest Port 1  View  Result Date: 06/05/2020 CLINICAL DATA:  Post intubation EXAM: PORTABLE CHEST 1 VIEW COMPARISON:  06/18/2020 FINDINGS: Endotracheal tube is 4.6 cm above the carina. Patchy bilateral upper lobe airspace opacities. Heart is normal size. Prior CABG. No effusions or pneumothorax. No acute bony abnormality. IMPRESSION: Endotracheal tube 4.6 cm above the carina. Patchy bilateral upper lobe airspace opacities, new since prior study. Electronically Signed   By: Rolm Baptise M.D.   On: 06/05/2020 00:17   DG Chest Port 1 View  Result Date: 06/28/2020 CLINICAL DATA:  Code sepsis.  Fever, weakness, diarrhea EXAM: PORTABLE CHEST 1 VIEW COMPARISON:  Radiograph 02/04/2020.  CT 04/08/2016 FINDINGS: Post median sternotomy and CABG. Coronary stent also visualized. Heart is normal in size aortic atherosclerosis. Mild elevation of right hemidiaphragm. There are ill-defined streaky opacities at both lung bases. No pulmonary edema. No confluent consolidation. No pleural fluid or pneumothorax. The bones are under mineralized without acute osseous abnormalities. IMPRESSION: 1. Ill-defined streaky opacities at both lung bases, favor atelectasis over pneumonia. 2. Post CABG. Normal heart size. Aortic Atherosclerosis (ICD10-I70.0). Electronically Signed   By: Keith Rake M.D.   On: 06/21/2020 20:31   DG Chest Port 1V same Day  Result Date: 06/05/2020 CLINICAL DATA:  OG tube in central line placement EXAM: PORTABLE CHEST 1 VIEW COMPARISON:  CT abdomen pelvis 06/20/2020, radiograph 06/05/2020 FINDINGS: Endotracheal tube tip terminates in the mid trachea, 4 cm from the carina. A transesophageal tube tip is seen coiling at the level of the GE junction possibly folded within the distal thoracic esophagus or within the gastric fundus. Could consider advancing at least 3 cm and reimaging. Right IJ catheter tip in the mid SVC. Additional support devices including pacer pads and telemetry leads overlie the chest. Prior sternotomy  and CABG with vascular stenting. Cardiac silhouette is similar to prior portable supine radiography accounting for differences in lung inflation. Calcified aorta is again noted. Diffuse interstitial and patchy airspace opacities appear to be increasing within the upper lungs with some atelectatic changes elsewhere. No pneumothorax or visible effusion. No acute osseous or soft tissue abnormality. IMPRESSION: 1. Endotracheal tube tip terminates in the mid trachea, 4 cm from the carina. 2. A transesophageal tube tip is seen coiling at the level of the GE junction possibly folded within the distal thoracic esophagus or within the gastric fundus. Could consider advancing at least 3 cm and reimaging. 3. Increasing upper lobe opacities  with some likely atelectatic changes in the lung bases. 4.  Aortic Atherosclerosis (ICD10-I70.0). Electronically Signed   By: Lovena Le M.D.   On: 06/05/2020 01:37    EKG: I independently viewed the EKG done and my findings are as followed: Supraventricular tachycardia at rate of 145 bpm  Assessment/Plan Present on Admission: . Pancolitis ([place]) . Acute respiratory failure with hypoxia ([place]) . Hypotension . Severe sepsis with septic shock ([place]) . Enteritis due to Clostridium difficile . Essential hypertension . GERD (gastroesophageal reflux disease)  Active Problems:   Essential hypertension   Hypotension   Severe sepsis with septic shock ([facility])   Enteritis due to Clostridium difficile   GERD (gastroesophageal reflux disease)   Pancolitis ([facility])   Acute respiratory failure with hypoxia ([facility])   Cardiac arrest ([facility])   Elevated troponin  Acute respiratory failure with hypoxia requiring IPPV secondary to acute cardiac arrest in the setting of severe sepsis with septic shock due to acute diarrhea caused by C. Difficile (pancolitis) Patient met sepsis criteria due to having leukocytosis, he was tachypneic and tachycardic (met SIRS criteria) He had an endorgan damage  with acute cardiac arrest and acute respiratory failure with hypoxia (met severe sepsis criteria) Patient continues to have hypotension despite IV hydration per sepsis protocol (met septic shock criteria) ABG done showed hypoxia CT abdomen and pelvis showed pancolitis suspicious to be infectious in origin Stool culture was positive for C. Difficile Continue vancomycin and metronidazole for C. difficile IV Zosyn was added due to intra-abdominal infection Continue IV Neo-Synephrine and IV vasopressin with goal of MAP of 65, with plan to wean patient as tolerated Continue IV Protonix 40 mg daily for stress ulcer prevention PCCM consult appreciated Gastroenterology will be consulted Repeat chest x-ray and ABG in the morning  Hypotension secondary to septic shock as described above Continue management as per above  GERD Continue IV Protonix as described above  Essential hypertension BP meds will be held at this time due to hypotension and patient requiring IV pressors  History of atrial flutter Patient was on Eliquis, this will be temporarily held due to blood noted in ET tube Home Lopressor will be temporarily held at this time due to hypotension requiring pressors  Elevated troponin possibly secondary to type II demand ischemia Troponin 12 > 818 First troponin was prior to cardiac arrest (ACLS), subsequent troponin level was elevated at 818 Continue to trend troponin and consider cardiology consult if troponin continues to increase  DVT prophylaxis: SCDs  Code Status: Full code  Family Communication: None at bedside  Disposition Plan:  Patient is from:                        home Anticipated DC to:                   SNF or family members home Anticipated DC date:               2-3 days Anticipated DC barriers:          Patient cannot be discharged at this time due to acute respiratory failure with hypoxia secondary to cardiac arrest and currently requiring mechanical ventilation  as well as being in septic shock due to C. difficile colitis requiring inpatient management with antibiotics   Consults called: Gastroenterology, [facility] PCCM  Admission status: Inpatient    Bernadette Hoit MD [facility]  06/05/2020, 9:11 AM

## 2020-06-05 ENCOUNTER — Inpatient Hospital Stay (HOSPITAL_COMMUNITY): Payer: Medicare Other

## 2020-06-05 ENCOUNTER — Ambulatory Visit (INDEPENDENT_AMBULATORY_CARE_PROVIDER_SITE_OTHER): Payer: Medicare Other | Admitting: Gastroenterology

## 2020-06-05 DIAGNOSIS — J9601 Acute respiratory failure with hypoxia: Secondary | ICD-10-CM | POA: Diagnosis present

## 2020-06-05 DIAGNOSIS — R778 Other specified abnormalities of plasma proteins: Secondary | ICD-10-CM

## 2020-06-05 DIAGNOSIS — K529 Noninfective gastroenteritis and colitis, unspecified: Secondary | ICD-10-CM | POA: Diagnosis not present

## 2020-06-05 DIAGNOSIS — Z8679 Personal history of other diseases of the circulatory system: Secondary | ICD-10-CM

## 2020-06-05 DIAGNOSIS — Z4659 Encounter for fitting and adjustment of other gastrointestinal appliance and device: Secondary | ICD-10-CM

## 2020-06-05 DIAGNOSIS — I469 Cardiac arrest, cause unspecified: Secondary | ICD-10-CM

## 2020-06-05 DIAGNOSIS — A419 Sepsis, unspecified organism: Secondary | ICD-10-CM | POA: Diagnosis not present

## 2020-06-05 DIAGNOSIS — I480 Paroxysmal atrial fibrillation: Secondary | ICD-10-CM | POA: Diagnosis not present

## 2020-06-05 DIAGNOSIS — A0472 Enterocolitis due to Clostridium difficile, not specified as recurrent: Secondary | ICD-10-CM | POA: Diagnosis not present

## 2020-06-05 DIAGNOSIS — R6521 Severe sepsis with septic shock: Secondary | ICD-10-CM

## 2020-06-05 DIAGNOSIS — I361 Nonrheumatic tricuspid (valve) insufficiency: Secondary | ICD-10-CM

## 2020-06-05 LAB — BLOOD GAS, ARTERIAL
Acid-base deficit: 9.5 mmol/L — ABNORMAL HIGH (ref 0.0–2.0)
Acid-base deficit: 0.7 mmol/L (ref 0.0–2.0)
Bicarbonate: 16.6 mmol/L — ABNORMAL LOW (ref 20.0–28.0)
Bicarbonate: 23.5 mmol/L (ref 20.0–28.0)
Drawn by: 22223
Drawn by: 38235
FIO2: 100
FIO2: 100
MECHVT: 500 mL
MECHVT: 510 mL
O2 Saturation: 88.9 %
O2 Saturation: 90.3 %
PEEP: 5 cmH2O
PEEP: 5 cmH2O
Patient temperature: 37
Patient temperature: 37
RATE: 20 resp/min
RATE: 18 resp/min
pCO2 arterial: 37.5 mmHg (ref 32.0–48.0)
pCO2 arterial: 43.2 mmHg (ref 32.0–48.0)
pH, Arterial: 7.259 — ABNORMAL LOW (ref 7.350–7.450)
pH, Arterial: 7.363 (ref 7.350–7.450)
pO2, Arterial: 69 mmHg — ABNORMAL LOW (ref 83.0–108.0)
pO2, Arterial: 64.6 mmHg — ABNORMAL LOW (ref 83.0–108.0)

## 2020-06-05 LAB — GASTROINTESTINAL PANEL BY PCR, STOOL (REPLACES STOOL CULTURE)

## 2020-06-05 LAB — COMPREHENSIVE METABOLIC PANEL
ALT: 37 U/L (ref 0–44)
AST: 57 U/L — ABNORMAL HIGH (ref 15–41)
Albumin: 2.2 g/dL — ABNORMAL LOW (ref 3.5–5.0)
Alkaline Phosphatase: 50 U/L (ref 38–126)
Anion gap: 12 (ref 5–15)
BUN: 21 mg/dL (ref 8–23)
CO2: 18 mmol/L — ABNORMAL LOW (ref 22–32)
Calcium: 7.5 mg/dL — ABNORMAL LOW (ref 8.9–10.3)
Chloride: 107 mmol/L (ref 98–111)
Creatinine, Ser: 1.79 mg/dL — ABNORMAL HIGH (ref 0.61–1.24)
GFR, Estimated: 38 mL/min — ABNORMAL LOW (ref >60)
Glucose, Bld: 131 mg/dL — ABNORMAL HIGH (ref 70–99)
Potassium: 3.2 mmol/L — ABNORMAL LOW (ref 3.5–5.1)
Sodium: 137 mmol/L (ref 135–145)
Total Bilirubin: 0.6 mg/dL (ref 0.3–1.2)
Total Protein: 4.7 g/dL — ABNORMAL LOW (ref 6.5–8.1)

## 2020-06-05 LAB — BASIC METABOLIC PANEL
Anion gap: 8 (ref 5–15)
BUN: 21 mg/dL (ref 8–23)
CO2: 15 mmol/L — ABNORMAL LOW (ref 22–32)
Calcium: 6.2 mg/dL — CL (ref 8.9–10.3)
Chloride: 111 mmol/L (ref 98–111)
Creatinine, Ser: 2.46 mg/dL — ABNORMAL HIGH (ref 0.61–1.24)
GFR, Estimated: 26 mL/min — ABNORMAL LOW (ref >60)
Glucose, Bld: 232 mg/dL — ABNORMAL HIGH (ref 70–99)
Potassium: 4.2 mmol/L (ref 3.5–5.1)
Sodium: 134 mmol/L — ABNORMAL LOW (ref 135–145)

## 2020-06-05 LAB — CORTISOL: Cortisol, Plasma: 4.7 ug/dL

## 2020-06-05 LAB — PROCALCITONIN: Procalcitonin: 2.79 ng/mL

## 2020-06-05 LAB — APTT: aPTT: 41 seconds — ABNORMAL HIGH (ref 24–36)

## 2020-06-05 LAB — GLUCOSE, CAPILLARY
Glucose-Capillary: 135 mg/dL — ABNORMAL HIGH (ref 70–99)
Glucose-Capillary: 199 mg/dL — ABNORMAL HIGH (ref 70–99)
Glucose-Capillary: 211 mg/dL — ABNORMAL HIGH (ref 70–99)
Glucose-Capillary: 297 mg/dL — ABNORMAL HIGH (ref 70–99)

## 2020-06-05 LAB — COOXEMETRY PANEL
Carboxyhemoglobin: 0.6 % (ref 0.5–1.5)
Methemoglobin: 0.9 % (ref 0.0–1.5)
O2 Saturation: 52.4 %
Total hemoglobin: 11.4 g/dL — ABNORMAL LOW (ref 12.0–16.0)

## 2020-06-05 LAB — TROPONIN I (HIGH SENSITIVITY)
Troponin I (High Sensitivity): 12 ng/L (ref <18)
Troponin I (High Sensitivity): 818 ng/L (ref <18)
Troponin I (High Sensitivity): 5229 ng/L (ref <18)

## 2020-06-05 LAB — ECHOCARDIOGRAM COMPLETE
AR max vel: 3.15 cm2
AV Area VTI: 3.03 cm2
AV Area mean vel: 3.03 cm2
AV Mean grad: 2 mmHg
AV Peak grad: 3.6 mmHg
Ao pk vel: 0.95 m/s
Area-P 1/2: 7.59 cm2
Height: 66 in
S' Lateral: 3.06 cm
Weight: 2709.01 oz

## 2020-06-05 LAB — MAGNESIUM
Magnesium: 1.7 mg/dL (ref 1.7–2.4)
Magnesium: 2 mg/dL (ref 1.7–2.4)

## 2020-06-05 LAB — PHOSPHORUS: Phosphorus: 3.6 mg/dL (ref 2.5–4.6)

## 2020-06-05 LAB — PROTIME-INR
INR: 1.6 — ABNORMAL HIGH (ref 0.8–1.2)
Prothrombin Time: 18.1 seconds — ABNORMAL HIGH (ref 11.4–15.2)

## 2020-06-05 LAB — MRSA PCR SCREENING: MRSA by PCR: NEGATIVE

## 2020-06-05 MED ORDER — MIDAZOLAM HCL 2 MG/2ML IJ SOLN
2.0000 mg | INTRAMUSCULAR | Status: DC | PRN
  Administered 2020-06-05 – 2020-06-08 (×11): 2 mg via INTRAVENOUS
  Filled 2020-06-05 (×13): qty 2

## 2020-06-05 MED ORDER — SODIUM BICARBONATE 8.4 % IV SOLN
INTRAVENOUS | Status: AC
  Administered 2020-06-05: 50 meq via INTRAVENOUS
  Filled 2020-06-05: qty 50

## 2020-06-05 MED ORDER — VASOPRESSIN 20 UNIT/ML IV SOLN
INTRAVENOUS | Status: AC
  Filled 2020-06-05: qty 1

## 2020-06-05 MED ORDER — NOREPINEPHRINE 4 MG/250ML-% IV SOLN
0.0000 ug/min | INTRAVENOUS | Status: DC
Start: 2020-06-05 — End: 2020-06-05

## 2020-06-05 MED ORDER — SODIUM CHLORIDE 0.9 % IV SOLN
250.0000 mL | INTRAVENOUS | Status: DC
  Administered 2020-06-07: 250 mL via INTRAVENOUS

## 2020-06-05 MED ORDER — NOREPINEPHRINE 4 MG/250ML-% IV SOLN
2.0000 ug/min | INTRAVENOUS | Status: DC
  Administered 2020-06-05: 2 ug/min via INTRAVENOUS
  Filled 2020-06-05: qty 250

## 2020-06-05 MED ORDER — SODIUM BICARBONATE 8.4 % IV SOLN
INTRAVENOUS | Status: AC
  Administered 2020-06-05: 50 meq
  Filled 2020-06-05: qty 100

## 2020-06-05 MED ORDER — VASOPRESSIN 20 UNITS/100 ML INFUSION FOR SHOCK
0.0000 [IU]/min | INTRAVENOUS | Status: DC
  Administered 2020-06-05 – 2020-06-12 (×15): 0.03 [IU]/min via INTRAVENOUS
  Filled 2020-06-05 (×14): qty 100

## 2020-06-05 MED ORDER — POTASSIUM CHLORIDE 10 MEQ/100ML IV SOLN
10.0000 meq | INTRAVENOUS | Status: AC
  Administered 2020-06-05 (×6): 10 meq via INTRAVENOUS
  Filled 2020-06-05 (×6): qty 100

## 2020-06-05 MED ORDER — ORAL CARE MOUTH RINSE
15.0000 mL | OROMUCOSAL | Status: DC
  Administered 2020-06-05 – 2020-06-14 (×86): 15 mL via OROMUCOSAL

## 2020-06-05 MED ORDER — CHLORHEXIDINE GLUCONATE 0.12% ORAL RINSE (MEDLINE KIT)
15.0000 mL | Freq: Two times a day (BID) | OROMUCOSAL | Status: DC
  Administered 2020-06-05 – 2020-06-14 (×19): 15 mL via OROMUCOSAL

## 2020-06-05 MED ORDER — VANCOMYCIN HCL 500 MG IV SOLR
500.0000 mg | Freq: Four times a day (QID) | Status: DC
  Administered 2020-06-06 – 2020-06-08 (×6): 500 mg via RECTAL
  Filled 2020-06-05 (×15): qty 500

## 2020-06-05 MED ORDER — METRONIDAZOLE IN NACL 5-0.79 MG/ML-% IV SOLN
500.0000 mg | Freq: Three times a day (TID) | INTRAVENOUS | Status: DC
  Administered 2020-06-05 – 2020-06-11 (×18): 500 mg via INTRAVENOUS
  Filled 2020-06-05 (×17): qty 100

## 2020-06-05 MED ORDER — SODIUM CHLORIDE 0.9 % IV SOLN: Freq: Once | INTRAVENOUS | Status: AC

## 2020-06-05 MED ORDER — CALCIUM GLUCONATE-NACL 1-0.675 GM/50ML-% IV SOLN
1.0000 g | Freq: Once | INTRAVENOUS | Status: AC
  Administered 2020-06-05: 1000 mg via INTRAVENOUS
  Filled 2020-06-05: qty 50

## 2020-06-05 MED ORDER — HEPARIN SODIUM (PORCINE) 5000 UNIT/ML IJ SOLN
5000.0000 [IU] | Freq: Three times a day (TID) | INTRAMUSCULAR | Status: DC
  Administered 2020-06-05 – 2020-06-08 (×10): 5000 [IU] via SUBCUTANEOUS
  Filled 2020-06-05 (×10): qty 1

## 2020-06-05 MED ORDER — ASPIRIN 300 MG RE SUPP
150.0000 mg | Freq: Every day | RECTAL | Status: DC
  Administered 2020-06-05: 150 mg via RECTAL

## 2020-06-05 MED ORDER — PHENYLEPHRINE HCL-NACL 10-0.9 MG/250ML-% IV SOLN
0.0000 ug/min | INTRAVENOUS | Status: DC
  Administered 2020-06-05: 20 ug/min via INTRAVENOUS
  Filled 2020-06-05 (×2): qty 250

## 2020-06-05 MED ORDER — AMIODARONE HCL IN DEXTROSE 360-4.14 MG/200ML-% IV SOLN
60.0000 mg/h | INTRAVENOUS | Status: AC
  Filled 2020-06-05: qty 200

## 2020-06-05 MED ORDER — SODIUM BICARBONATE 8.4 % IV SOLN
INTRAVENOUS | Status: DC
  Filled 2020-06-05 (×10): qty 100

## 2020-06-05 MED ORDER — SODIUM CHLORIDE 0.9 % IV BOLUS
1000.0000 mL | Freq: Once | INTRAVENOUS | Status: AC
  Administered 2020-06-05: 1000 mL via INTRAVENOUS

## 2020-06-05 MED ORDER — AMIODARONE HCL IN DEXTROSE 360-4.14 MG/200ML-% IV SOLN
30.0000 mg/h | INTRAVENOUS | Status: DC
  Administered 2020-06-05: 60 mg/h via INTRAVENOUS
  Administered 2020-06-05 – 2020-06-09 (×9): 30 mg/h via INTRAVENOUS
  Filled 2020-06-05 (×9): qty 200

## 2020-06-05 MED ORDER — PIPERACILLIN-TAZOBACTAM 3.375 G IVPB 30 MIN
3.3750 g | Freq: Four times a day (QID) | INTRAVENOUS | Status: DC
Start: 2020-06-05 — End: 2020-06-05

## 2020-06-05 MED ORDER — VANCOMYCIN 50 MG/ML ORAL SOLUTION
500.0000 mg | Freq: Four times a day (QID) | ORAL | Status: DC
  Administered 2020-06-05 – 2020-06-11 (×23): 500 mg
  Filled 2020-06-05 (×28): qty 10

## 2020-06-05 MED ORDER — VANCOMYCIN 50 MG/ML ORAL SOLUTION
125.0000 mg | Freq: Four times a day (QID) | ORAL | Status: DC
  Filled 2020-06-05 (×14): qty 2.5

## 2020-06-05 MED ORDER — MAGNESIUM SULFATE 2 GM/50ML IV SOLN
2.0000 g | Freq: Once | INTRAVENOUS | Status: AC
  Administered 2020-06-05: 2 g via INTRAVENOUS
  Filled 2020-06-05: qty 50

## 2020-06-05 MED ORDER — CHLORHEXIDINE GLUCONATE CLOTH 2 % EX PADS
6.0000 | MEDICATED_PAD | Freq: Every day | CUTANEOUS | Status: DC
  Administered 2020-06-05 – 2020-06-14 (×8): 6 via TOPICAL

## 2020-06-05 MED ORDER — NOREPINEPHRINE 4 MG/250ML-% IV SOLN
0.0000 ug/min | INTRAVENOUS | Status: DC
  Administered 2020-06-05: 30 ug/min via INTRAVENOUS
  Administered 2020-06-05 (×4): 40 ug/min via INTRAVENOUS
  Administered 2020-06-05: 38 ug/min via INTRAVENOUS
  Administered 2020-06-05 (×3): 40 ug/min via INTRAVENOUS
  Filled 2020-06-05 (×5): qty 250
  Filled 2020-06-05: qty 500
  Filled 2020-06-05 (×2): qty 250

## 2020-06-05 MED ORDER — PHENYLEPHRINE HCL-NACL 10-0.9 MG/250ML-% IV SOLN
0.0000 ug/min | INTRAVENOUS | Status: DC
  Administered 2020-06-05: 350 ug/min via INTRAVENOUS
  Administered 2020-06-05: 375 ug/min via INTRAVENOUS
  Administered 2020-06-05: 400 ug/min via INTRAVENOUS
  Administered 2020-06-05: 350 ug/min via INTRAVENOUS
  Administered 2020-06-05: 400 ug/min via INTRAVENOUS
  Administered 2020-06-05: 160 ug/min via INTRAVENOUS
  Administered 2020-06-05: 375 ug/min via INTRAVENOUS
  Administered 2020-06-05: 350 ug/min via INTRAVENOUS
  Administered 2020-06-05: 375 ug/min via INTRAVENOUS
  Administered 2020-06-05: 400 ug/min via INTRAVENOUS
  Administered 2020-06-05: 350 ug/min via INTRAVENOUS
  Filled 2020-06-05 (×5): qty 250

## 2020-06-05 MED ORDER — NOREPINEPHRINE 16 MG/250ML-% IV SOLN
0.0000 ug/min | INTRAVENOUS | Status: DC
  Administered 2020-06-05: 28 ug/min via INTRAVENOUS
  Administered 2020-06-06: 18 ug/min via INTRAVENOUS
  Administered 2020-06-06: 25 ug/min via INTRAVENOUS
  Administered 2020-06-07: 19 ug/min via INTRAVENOUS
  Administered 2020-06-08 – 2020-06-12 (×3): 5 ug/min via INTRAVENOUS
  Administered 2020-06-14: 2 ug/min via INTRAVENOUS
  Filled 2020-06-05 (×8): qty 250

## 2020-06-05 MED ORDER — FENTANYL 2500MCG IN NS 250ML (10MCG/ML) PREMIX INFUSION
0.0000 ug/h | INTRAVENOUS | Status: DC
  Administered 2020-06-05: 150 ug/h via INTRAVENOUS
  Administered 2020-06-05: 50 ug/h via INTRAVENOUS
  Administered 2020-06-06 – 2020-06-07 (×2): 150 ug/h via INTRAVENOUS
  Administered 2020-06-07 – 2020-06-08 (×3): 200 ug/h via INTRAVENOUS
  Administered 2020-06-09: 250 ug/h via INTRAVENOUS
  Administered 2020-06-09: 225 ug/h via INTRAVENOUS
  Administered 2020-06-10: 200 ug/h via INTRAVENOUS
  Administered 2020-06-10 (×2): 225 ug/h via INTRAVENOUS
  Administered 2020-06-11: 25 ug/h via INTRAVENOUS
  Filled 2020-06-05 (×13): qty 250

## 2020-06-05 MED ORDER — SODIUM BICARBONATE 8.4 % IV SOLN: 50.0000 meq | Freq: Once | INTRAVENOUS | Status: AC

## 2020-06-05 MED ORDER — DOBUTAMINE IN D5W 4-5 MG/ML-% IV SOLN
2.0000 ug/kg/min | INTRAVENOUS | Status: DC
  Administered 2020-06-05: 2 ug/kg/min via INTRAVENOUS
  Filled 2020-06-05 (×2): qty 250

## 2020-06-05 MED ORDER — HYDROCORTISONE NA SUCCINATE PF 100 MG IJ SOLR
50.0000 mg | Freq: Four times a day (QID) | INTRAMUSCULAR | Status: DC
  Administered 2020-06-05 – 2020-06-14 (×37): 50 mg via INTRAVENOUS
  Filled 2020-06-05 (×37): qty 2

## 2020-06-05 MED ORDER — PERFLUTREN LIPID MICROSPHERE
1.0000 mL | INTRAVENOUS | Status: AC | PRN
  Administered 2020-06-05: 3 mL via INTRAVENOUS
  Filled 2020-06-05: qty 10

## 2020-06-05 MED ORDER — PANTOPRAZOLE SODIUM 40 MG IV SOLR
40.0000 mg | INTRAVENOUS | Status: DC
  Administered 2020-06-05 – 2020-06-11 (×7): 40 mg via INTRAVENOUS
  Filled 2020-06-05 (×7): qty 40

## 2020-06-05 MED ORDER — PIPERACILLIN-TAZOBACTAM 3.375 G IVPB
3.3750 g | Freq: Three times a day (TID) | INTRAVENOUS | Status: DC
  Administered 2020-06-05 – 2020-06-07 (×7): 3.375 g via INTRAVENOUS
  Filled 2020-06-05 (×7): qty 50

## 2020-06-05 MED ORDER — PHENYLEPHRINE CONCENTRATED 100MG/250ML (0.4 MG/ML) INFUSION SIMPLE
0.0000 ug/min | INTRAVENOUS | Status: DC
  Administered 2020-06-05: 300 ug/min via INTRAVENOUS
  Administered 2020-06-05 (×2): 400 ug/min via INTRAVENOUS
  Administered 2020-06-06: 125 ug/min via INTRAVENOUS
  Administered 2020-06-06: 200 ug/min via INTRAVENOUS
  Administered 2020-06-07: 80 ug/min via INTRAVENOUS
  Filled 2020-06-05 (×8): qty 250

## 2020-06-05 MED ORDER — NOREPINEPHRINE 16 MG/250ML-% IV SOLN
INTRAVENOUS | Status: AC
  Filled 2020-06-05: qty 250

## 2020-06-05 NOTE — ED Notes (Signed)
Patient daughter and spouse at bedside.

## 2020-06-05 NOTE — Progress Notes (Addendum)
[facility] Progress Note Patient Name: Samuel Gregory DOB: 1940-12-21 MRN: [id number]   Date of Service  06/05/2020  HPI/Events of Note  Patient arrives from ED hypotensive with BP = 48/34 with MAP = 37. HR= 114. Not much known about patient as H&P not finished. We do know thet the abdominal CT Scan c/ww pancreatic CA.  Apparently patient had a VFIB arrest in ED. Currently he is a full code.   eICU Interventions  Plan: 1. Bolus with 0.9 NaCl 1 liter IV over 1 hour now. 2. Phenylephrine IV infusion. Titrate to MAAP >= 65.  3. Monitor CVP now and Q 4 hours. 4. ABG STAT. 5. Respiratory therapy to place A-line. 6.. Nursing to page hospitalist to help with management and possible GOC discussion with family.      Intervention Category Evaluation Type: New Patient Evaluation  [name] 06/05/2020, 3:59 AM

## 2020-06-05 NOTE — ED Notes (Signed)
20F [place] sump NG tube inserted to L nare for decompression.  No return of gastric contents.  MD notified.

## 2020-06-05 NOTE — Progress Notes (Signed)
Amiodarone Drug - Drug Interaction Consult Note   Recommendations: Patient's medication profile has been reviewed for possible interaction with amiodarone infusion . Patient isn't on any medications that interact with amiodarone at this time.   Amiodarone is metabolized by the cytochrome P450 system and therefore has the potential to cause many drug interactions. Amiodarone has an average plasma half-life of 50 days (range 20 to 100 days).    There is potential for drug interactions to occur several weeks or months after stopping treatment and the onset of drug interactions may be slow after initiating amiodarone.    []          Statins: Increased risk of myopathy. Simvastatin- restrict dose to 20mg  daily. Other statins: counsel patients to report any muscle pain or weakness immediately.   []          Anticoagulants: Amiodarone can increase anticoagulant effect. Consider warfarin dose reduction. Patients should be monitored closely and the dose of anticoagulant altered accordingly, remembering that amiodarone levels take several weeks to stabilize.   []          Antiepileptics: Amiodarone can increase plasma concentration of phenytoin, the dose should be reduced. Note that small changes in phenytoin dose can result in large changes in levels. Monitor patient and counsel on signs of toxicity.   []          Beta blockers: increased risk of bradycardia, AV block and myocardial depression. Sotalol - avoid concomitant use.   []          Calcium channel blockers (diltiazem and verapamil): increased risk of bradycardia, AV block and myocardial depression.   []          Cyclosporine: Amiodarone increases levels of cyclosporine. Reduced dose of cyclosporine is recommended.   []          Digoxin dose should be halved when amiodarone is started.   []          Diuretics: increased risk of cardiotoxicity if hypokalemia occurs.   []          Oral hypoglycemic agents (glyburide, glipizide, glimepiride): increased  risk of hypoglycemia. Patient's glucose levels should be monitored closely when initiating amiodarone therapy.    []          Drugs that prolong the QT interval:  Torsades de pointes risk may be increased with concurrent use - avoid if possible.  Monitor QTc, also keep magnesium/potassium WNL if concurrent therapy can't be avoided. Marland Kitchen Antibiotics: e.g. fluoroquinolones, erythromycin. . Antiarrhythmics: e.g. quinidine, procainamide, disopyramide, sotalol. . Antipsychotics: e.g. phenothiazines, haloperidol.             . Lithium, tricyclic antidepressants, and methadone.  Pharmacy will continue to monitor patient's profile for new medications that might interact with amiodarone.  Thank you for allowing pharmacy to participate in this patient's care.  Despina Pole, Pharm. D. Clinical Pharmacist 06/05/2020 10:45 AM

## 2020-06-05 NOTE — Progress Notes (Addendum)
*  PRELIMINARY RESULTS* Echocardiogram 2D Echocardiogram has been performed. Alvino Chapel added definity.  Leavy Cella 06/05/2020, 2:50 PM

## 2020-06-05 NOTE — ED Notes (Signed)
Report given to Vivia Birmingham, RN in ICU at this facility.

## 2020-06-05 NOTE — Progress Notes (Signed)
[place] Progress Note Patient Name: Samuel Gregory DOB: August 20, 1940 MRN: [id number]   Date of Service  06/05/2020  HPI/Events of Note  Hypotension - BP = 73/52. Only on 150 mcg/min of Phenylephrine IV infusion.   eICU Interventions  Plan: 1. Titrate Phenylephrine IV infusion as ordered.  2. Vasopressin IV infusion at shock dose.  3. Norepinephrine IV infusion. Titrate to MAP >= 65. 4. Random cortisol level STAT. 4. Hydrocortisone 50 mg IV after obtaining random cortisol level and Q 6 hours.      Intervention Category Major Interventions: Hypotension - evaluation and management  Samuel Gregory 06/05/2020, 5:30 AM

## 2020-06-05 NOTE — Consult Note (Signed)
NAME:  Samuel Gregory, MRN:  [id number], DOB:  1941/06/04, LOS: 1 ADMISSION DATE:  06/22/2020, CONSULTATION DATE:  11/5 REFERRING MD:  Tat/Triad, CHIEF COMPLAINT:  Shock/ resp failure from [facility]   Brief History   62 yowm never smoker with h/o prostate ca and prior c diff infection admitted 11/4 with voluminous diarrhea and abd pain and coded in ER   VF arrest > admit to ICU with pancolitis on admit CT / vent dep/ pressor dep and PCCM consulted am 11/5   History of present illness   79 y.o. male with past medical history of atrial fibrillation on Eliquis, coronary artery disease status post CABG in 2000, hypertension, hyperlipidemia, GERD, HLD,metastatic prostate cancer and history of C. difficile infection x1, who presented for evaluation of acute onset of diarrhea and arrested as w/u in progress.   Per his daugher he was ambulatory prior to acute illness   Past Medical History     Significant Hospital Events   VF arrest pm 11/4 > amiodarone drip  Consults:  PCCM 11/5  Urology 11/5 Cards   11/5   Procedures:  ET  11/5 >>> R IJ  11/5 >>> R Rad Art line 11/5 >>>  Significant Diagnostic Tests:   CT abd 11/4 1. Marked pancolitis that is worse of the rectosigmoid colon. Etiology could be infectious, inflammatory, ischemic. No associated bowel obstruction or perforation. Correlate with stool cultures. 2. Other imaging findings of potential clinical significance: Cholelithiasis with no acute cholecystitis. 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 Small right inguinal hernia containing fat and fluid. Aortic Atherosclerosis  Micro Data:  COVID   19  PCR  11/4 Neg  Flu A/B    PCR   11/4  Neg  Stool 11/4 neg C diff antigen/ toxix  11/4  Both POS BC x 2  11/4 >>> MRSA PCR 11/5 neg   Antimicrobials:  Rocephin  11/4  Cipro  11/4  Flagyl  11/4  >>> Zosyn  11/4  >>> Vanc per rectalog  tube11/5  >>>  Scheduled Meds: . aspirin  150 mg Rectal Daily  .  chlorhexidine gluconate (MEDLINE KIT)  15 mL Mouth Rinse BID  . Chlorhexidine Gluconate Cloth  6 each Topical Daily  . heparin  5,000 Units Subcutaneous Q8H  . hydrocortisone sod succinate (SOLU-CORTEF) inj  50 mg Intravenous Q6H  . mouth rinse  15 mL Mouth Rinse 10 times per day  . pantoprazole (PROTONIX) IV  40 mg Intravenous Q24H  . vancomycin  500 mg Per Tube Q6H  . vancomycin (VANCOCIN) rectal ENEMA  500 mg Rectal Q6H   Continuous Infusions: . sodium chloride    . amiodarone 60 mg/hr (06/05/20 1336)  . fentaNYL infusion INTRAVENOUS 125 mcg/hr (06/05/20 0552)  . metronidazole 100 mL/hr at 06/05/20 1200  . norepinephrine (LEVOPHED) Adult infusion 40 mcg/min (06/05/20 1343)  . phenylephrine (NEO-SYNEPHRINE) Adult infusion 400 mcg/min (06/05/20 1134)  . piperacillin-tazobactam (ZOSYN)  IV Stopped (06/05/20 0919)  . vasopressin 0.03 Units/min (06/05/20 1200)   PRN Meds:.midazolam   Interim history/subjective:  bp improved but multiple pressors and very little uop  Objective   Blood pressure 135/77, pulse (!) 120, temperature 99 F (37.2 C), temperature source Oral, resp. rate (!) 26, height $RemoveBe'5\' 6"'LoXmvXrzd$  (1.676 m), weight 76.8 kg, SpO2 94 %. CVP:  [12 mmHg-28 mmHg] 22 mmHg  Vent Mode: PRVC FiO2 (%):  [100 %] 100 % Set Rate:  [16 bmp-18 bmp] 18 bmp Vt Set:  [500 mL-510 mL]  510 mL PEEP:  [[place] Pressure:  [17 cmH20-35 cmH20] 28 cmH20   Intake/Output Summary (Last 24 hours) at 06/05/2020 1348 Last data filed at 06/05/2020 1200 Gross per 24 hour  Intake 7418.56 ml  Output --  Net 7418.56 ml   Filed Weights   06/05/20 0349  Weight: 76.8 kg   CVP:  [12 mmHg-28 mmHg] 22 mmHg   Examination: Tmax 100.3 General: acutely and chronically ill appearing elderly wm sedated on vent  HENT: oral et/ og Lungs: distant bs Cardiovascular: RRR  ST on amiodarone drip Abdomen: markedly distended  Extremities: cool s edema Neuro: sedate    I personally reviewed images and  agree with radiology impression as follows:  CXR:   Portable  11/5 1. Endotracheal tube tip terminates in the mid trachea, 4 cm from the carina. 2. A transesophageal tube tip is seen coiling at the level of the GE junction possibly folded within the distal thoracic esophagus or within the gastric fundus. Could consider advancing at least 3 cm and reimaging. 3. Increasing upper lobe opacities with some likely atelectatic changes in the lung bases.  Resolved Hospital Problem list      Assessment & Plan:  1)  Acute hypoxemic respiratory failure in setting of septic shock s underlying lung dz  - mechanics and gas exchange ok for now> no changes needed  2) ciculatory shock c/w gi sepsis from [facility]  By cvp appears adequately vol repleted  >> wean neo since it's chronotropic and he doesn' need that here where problem is mostly vasodilation  >> agree with HC since on chronic pred at home   3) [facility] on approp rx   4) AKI, oliguric - difficult foley placed by renal   5) Met acidosis with AG secondary to #2  - add hc03 to IV fluids  Best practice:  Diet: npo Pain/Anxiety/Delirium protocol (if indicated): sedated on fent/ versed prn VAP protocol (if indicated):  DVT prophylaxis: PAS GI prophylaxis: PPI Glucose control: per triad Mobility: bed rest Code Status: fu Family Communication: daughter at bedside  Disposition: ICU  Labs   CBC: Recent Labs  Lab 06/15/2020 2016  WBC 13.1*  NEUTROABS 9.3*  HGB 13.2  HCT 40.9  MCV 99.5  PLT 096    Basic Metabolic Panel: Recent Labs  Lab 06/16/2020 2016 06/05/20 0234  NA 134* 137  K 3.5 3.2*  CL 99 107  CO2 23 18*  GLUCOSE 137* 131*  BUN 23 21  CREATININE 1.51* 1.79*  CALCIUM 8.9 7.5*  MG  --  1.7  PHOS  --  3.6   GFR: Estimated Creatinine Clearance: 32.7 mL/min (A) (by C-G formula based on SCr of 1.79 mg/dL (H)). Recent Labs  Lab 06/25/2020 2016 06/05/20 0234  PROCALCITON  --  2.79  WBC 13.1*  --   LATICACIDVEN 1.9  --       Liver Function Tests: Recent Labs  Lab 06/26/2020 2016 06/05/20 0234  AST 17 57*  ALT 13 37  ALKPHOS 50 50  BILITOT 1.0 0.6  PROT 6.3* 4.7*  ALBUMIN 3.0* 2.2*   No results for input(s): LIPASE, AMYLASE in the last 168 hours. No results for input(s): AMMONIA in the last 168 hours.  ABG    Component Value Date/Time   PHART 7.363 06/05/2020 0422   PCO2ART 43.2 06/05/2020 0422   PO2ART 64.6 (L) 06/05/2020 0422   HCO3 23.5 06/05/2020 0422   ACIDBASEDEF 0.7 06/05/2020 0422   O2SAT 90.3 06/05/2020 0422  Coagulation Profile: Recent Labs  Lab 06/05/2020 2016 06/05/20 0234  INR 1.3* 1.6*    Cardiac Enzymes: No results for input(s): CKTOTAL, CKMB, CKMBINDEX, TROPONINI in the last 168 hours.  HbA1C: No results found for: HGBA1C  CBG: Recent Labs  Lab 06/05/20 0750 06/05/20 1138  GLUCAP 135* 199*       Past Medical History  He,  has a past medical history of Arthritis, Atrial flutter ([place]), Collagen vascular disease ([place]), Coronary atherosclerosis of native coronary artery, Essential hypertension, Hyperlipidemia, and Prostate cancer ([place]).   Surgical History    Past Surgical History:  Procedure Laterality Date  . APPENDECTOMY     8 yrs ago- Dr Geroge Baseman  . CATARACT EXTRACTION W/PHACO Left 07/14/2014   Procedure: CATARACT EXTRACTION PHACO AND INTRAOCULAR LENS PLACEMENT (IOC);  Surgeon: Tonny Branch, MD;  Location: [facility] ORS;  Service: Ophthalmology;  Laterality: Left;  CDE:6.20  . CATARACT EXTRACTION W/PHACO Right 08/11/2014   Procedure: CATARACT EXTRACTION PHACO AND INTRAOCULAR LENS PLACEMENT RIGHT EYE;  Surgeon: Tonny Branch, MD;  Location: [facility] ORS;  Service: Ophthalmology;  Laterality: Right;  CDE:10.11  . COLONOSCOPY N/A 01/14/2015   Procedure: COLONOSCOPY;  Surgeon: Rogene Houston, MD;  Location: [facility] ENDO SUITE;  Service: Endoscopy;  Laterality: N/A;  730  . CORONARY ARTERY BYPASS GRAFT  01/1999   LIMA to LAD, SVG to diagonal, sequential SVG to PDA and PLB  .  PROSTATECTOMY  2004   [name]  . TONSILLECTOMY     age 27     Social History   reports that he has never smoked. He has never used smokeless tobacco. He reports current alcohol use. He reports that he does not use drugs.   Family History   His family history includes Aneurysm in his father; Cancer in his mother; Heart attack in his mother.   Allergies Allergies  Allergen Reactions  . Latex Rash  . Sulfa Antibiotics Rash     Home Medications  Prior to Admission medications   Medication Sig Start Date End Date Taking? Authorizing Provider  acetaminophen (TYLENOL) 325 MG tablet Take 2 tablets (650 mg total) by mouth every 6 (six) hours as needed for mild pain, fever or headache (or Fever >/= 101). 02/06/20   Emokpae, Courage, MD  apixaban (ELIQUIS) 5 MG TABS tablet Take 1 tablet (5 mg total) by mouth 2 (two) times daily. 03/31/20 03/26/21  Erma Heritage, PA-C  b complex vitamins capsule Take 1 capsule by mouth daily.    [provider]  Calcium-Magnesium-Vitamin D (CALCIUM 1200+D3 PO) Take 1 tablet by mouth daily.     [provider]  cholecalciferol (VITAMIN D3) 25 MCG (1000 UNIT) tablet Take 1,000 Units by mouth daily.     [provider]  fluticasone (FLONASE) 50 MCG/ACT nasal spray Place 1 spray into both nostrils daily as needed for allergies or rhinitis.    [provider]  metoprolol tartrate (LOPRESSOR) 25 MG tablet Take 75 mg by mouth 2 (two) times daily.  04/27/20   [provider]  Multiple Vitamin (MULTIVITAMIN WITH MINERALS) TABS Take 1 tablet by mouth daily.    [provider]  nitroGLYCERIN (NITROSTAT) 0.4 MG SL tablet Place 0.4 mg under the tongue every 5 (five) minutes as needed for chest pain.    [provider]  predniSONE (DELTASONE) 5 MG tablet Take 5 mg by mouth 2 (two) times daily with a meal. Take with cancer medication.    [provider]  rosuvastatin (CRESTOR) 40  MG tablet Take 1  tablet (40 mg total) by mouth daily after supper. 11/29/19 03/31/20  Satira Sark, MD  rosuvastatin (CRESTOR) 40 MG tablet Take 40 mg by mouth daily.    [provider]  Thiamine HCl (VITAMIN B-1 PO) Take 1 tablet by mouth every morning.  Patient not taking: Reported on 06/27/2020    [provider]  triamcinolone cream (KENALOG) 0.1 % Apply topically. 05/01/20   [provider]  ZYTIGA 250 MG tablet Take 4 tablets by mouth every morning.  09/20/16   [provider]       The patient is critically ill with multiple organ systems failure and requires high complexity decision making for assessment and support, frequent evaluation and titration of therapies, application of advanced monitoring technologies and extensive interpretation of multiple databases. Critical Care Time devoted to patient care services described in this note is 45 minutes.   Christinia Gully, MD Pulmonary and [place] [phone]   After 7:00 pm call Elink  [phone]

## 2020-06-05 NOTE — Progress Notes (Signed)
[facility] ADULT ICU REPLACEMENT PROTOCOL   The patient does apply for the [facility] Adult ICU Electrolyte Replacment Protocol based on the criteria listed below:   1. Is GFR >/= 30 ml/min? Yes.    Patient's GFR today is 47 2. Is SCr </= 2? Yes.   Patient's SCr is 1.51 ml/kg/hr 3. Did SCr increase >/= 0.5 in 24 hours? No. 4. Abnormal electrolyte(s): k 3.2, Mag 1.7 5. Ordered repletion with: protcol 6. If a panic level lab has been reported, has the CCM MD in charge been notified? Yes.    Physician:  D/w Dr Bonne Dolores, Fauna Neuner A 06/05/2020 4:30 AM

## 2020-06-05 NOTE — ED Notes (Signed)
Patient noted to spontaneously go into apparent v-fib on cardiac monitoring system.  This nurse to the bedside along with nursing supervisor.  Patient had eyes partially open but not responding verbally.  Unresponsive to painful stimuli.  Patient had no palpable carotid pulse.  Facility "code blue" for suspected cardiac arrest initiated along with CPR.

## 2020-06-05 NOTE — Progress Notes (Signed)
CRITICAL VALUE ALERT  Critical Value:  Ca 6.2 and T 5229  Date & Time Notied:06/05/2020 1430  Provider Notified: Dr. Tat/Dr branch  Orders Received/Actions taken:

## 2020-06-05 NOTE — Consult Note (Addendum)
Referring Provider: [facility] Primary Care Physician:  Celene Squibb, MD Primary Gastroenterologist:  Dr. Jenetta Downer  Date of Admission: 06/06/2020 Date of Consultation: 06/05/20  Reason for Consultation:  Fulminant C. Diff colitis  HPI:  Samuel Gregory is a 79 y.o. Gregory with a past medical history of atrial flutter, CAD status post CABG in 2000 and NST in 2019 with mild to moderate infarct ischemia and medical management recommended.  Also with hypertension, hyperlipidemia, metastatic prostate cancer.  The patient was seen yesterday by Dr. Jenetta Downer for acute diarrhea.  Noted history of C. difficile infection status post vancomycin treatment x1.  At that point he noted 10 days of watery stools x3-6 episodes a day despite antidiarrheals.  Denied hematochezia or melena, associated lower abdominal discomfort with stooling.  Nauseated but no vomiting.  Has been trying to push fluids, no recent antibiotics.  Previous colonoscopy 8 years prior with a few polyps (per the patient) but no available report.  Recommended stool test, CBC, CMP.  ER precautions were given.  Later that evening the patient presented to the emergency department with complaints of worsening including weakness, fever.  He was noted to be tachycardic at 144, although BP good at 133/84 initially.  Low-grade temp noted.  He was admitted for likely dehydration.  However, while in the emergency room the patient went into spontaneous V. fib arrest and a CODE BLUE was initiated.  NG tube was placed, triple-lumen catheter to the right neck was placed, the patient was intubated and placed on the ventilator due to agonal breathing even with ROSC.  The code lasted approximately 10 to 12 minutes with 2 rounds of epinephrine, 1 shock, amiodarone IV push.  He was subsequently started on an IV amiodarone drip.  Postcode noted hypotension and tachycardia with BP initially 85/60, heart rate 139.  The patient was transferred to the ICU and on  arrival blood pressure was 48/34 with a MAP of 37, heart rate 114.  CT scan showed pancolonic colitis as well as stable but known pancreatic cancer.  He was placed on phenylephrine titration for MAP greater than 65, A-line placed.   Labs since admission show C. difficile quick scan positive for antigen and toxin, SARS-CoV-2 negative, GI pathogen panel in process, blood cultures negative to date, lactic acid normal at 1.9, acute kidney injury with mild bump in serum creatinine to 1.51.  Leukocytosis with a white blood cell count of 13.1, hemoglobin normal, INR mildly elevated at 1.3 initially that has since increased to 1.6.  Most recent creatinine bump to 1.79 with a GFR estimated at 38.  Today   Past Medical History:  Diagnosis Date  . Arthritis   . Atrial flutter ([place])    a. diagnosed in 12/2019 while admitted for sepsis  . Collagen vascular disease ([place])   . Coronary atherosclerosis of native coronary artery    a. s/p CABG in 2000 b. NST in 10/2017 showing mild to moderate per-infarct ischemia with medical management recommended  . Essential hypertension   . Hyperlipidemia   . Prostate cancer [facility])     Past Surgical History:  Procedure Laterality Date  . APPENDECTOMY     8 yrs ago- Dr Geroge Baseman  . CATARACT EXTRACTION W/PHACO Left 07/14/2014   Procedure: CATARACT EXTRACTION PHACO AND INTRAOCULAR LENS PLACEMENT (IOC);  Surgeon: Tonny Branch, MD;  Location: [facility] ORS;  Service: Ophthalmology;  Laterality: Left;  CDE:6.20  . CATARACT EXTRACTION W/PHACO Right 08/11/2014   Procedure: CATARACT EXTRACTION PHACO AND INTRAOCULAR LENS  PLACEMENT RIGHT EYE;  Surgeon: Tonny Branch, MD;  Location: [facility] ORS;  Service: Ophthalmology;  Laterality: Right;  CDE:10.11  . COLONOSCOPY N/A 01/14/2015   Procedure: COLONOSCOPY;  Surgeon: Rogene Houston, MD;  Location: [facility] ENDO SUITE;  Service: Endoscopy;  Laterality: N/A;  730  . CORONARY ARTERY BYPASS GRAFT  01/1999   LIMA to LAD, SVG to diagonal, sequential SVG to PDA  and PLB  . PROSTATECTOMY  2004   [name]  . TONSILLECTOMY     age 59    Prior to Admission medications   Medication Sig Start Date End Date Taking? Authorizing Provider  acetaminophen (TYLENOL) 325 MG tablet Take 2 tablets (650 mg total) by mouth every 6 (six) hours as needed for mild pain, fever or headache (or Fever >/= 101). 02/06/20   Emokpae, Courage, MD  apixaban (ELIQUIS) 5 MG TABS tablet Take 1 tablet (5 mg total) by mouth 2 (two) times daily. 03/31/20 03/26/21  Erma Heritage, PA-C  b complex vitamins capsule Take 1 capsule by mouth daily.    [provider]  Calcium-Magnesium-Vitamin D (CALCIUM 1200+D3 PO) Take 1 tablet by mouth daily.     [provider]  cholecalciferol (VITAMIN D3) 25 MCG (1000 UNIT) tablet Take 1,000 Units by mouth daily.     [provider]  fluticasone (FLONASE) 50 MCG/ACT nasal spray Place 1 spray into both nostrils daily as needed for allergies or rhinitis.    [provider]  metoprolol tartrate (LOPRESSOR) 25 MG tablet Take 75 mg by mouth 2 (two) times daily.  04/27/20   [provider]  Multiple Vitamin (MULTIVITAMIN WITH MINERALS) TABS Take 1 tablet by mouth daily.    [provider]  nitroGLYCERIN (NITROSTAT) 0.4 MG SL tablet Place 0.4 mg under the tongue every 5 (five) minutes as needed for chest pain.    [provider]  predniSONE (DELTASONE) 5 MG tablet Take 5 mg by mouth 2 (two) times daily with a meal. Take with cancer medication.    [provider]  rosuvastatin (CRESTOR) 40 MG tablet Take 1 tablet (40 mg total) by mouth daily after supper. 11/29/19 03/31/20  Satira Sark, MD  rosuvastatin (CRESTOR) 40 MG tablet Take 40 mg by mouth daily.    [provider]  Thiamine HCl (VITAMIN B-1 PO) Take 1 tablet by mouth every morning.  Patient not taking: Reported on 06/28/2020    [provider]  triamcinolone cream (KENALOG) 0.1 % Apply topically. 05/01/20    [provider]  ZYTIGA 250 MG tablet Take 4 tablets by mouth every morning.  09/20/16   [provider]    Current Facility-Administered Medications  Medication Dose Route Frequency Provider Last Rate Last Admin  . 0.9 %  sodium chloride infusion  250 mL Intravenous Continuous Adefeso, Oladapo, DO      . amiodarone (NEXTERONE PREMIX) 360-4.14 MG/200ML-% (1.8 mg/mL) IV infusion  60 mg/hr Intravenous Continuous Anders Simmonds, MD      . amiodarone (NEXTERONE PREMIX) 360-4.14 MG/200ML-% (1.8 mg/mL) IV infusion  30 mg/hr Intravenous Continuous Anders Simmonds, MD 16.67 mL/hr at 06/05/20 0512 30 mg/hr at 06/05/20 0512  . chlorhexidine gluconate (MEDLINE KIT) (PERIDEX) 0.12 % solution 15 mL  15 mL Mouth Rinse BID Tat, David, MD      . fentaNYL 259mg in NS 2559m(1038mml) infusion-PREMIX  0-400 mcg/hr Intravenous Continuous Adefeso, Oladapo, DO 12.5 mL/hr at 06/05/20 0552 125 mcg/hr at 06/05/20 0552  . heparin injection 5,000  Units  5,000 Units Subcutaneous Q8H Adefeso, Oladapo, DO   5,000 Units at 06/05/20 0500  . hydrocortisone sodium succinate (SOLU-CORTEF) 100 MG injection 50 mg  50 mg Intravenous Q6H Anders Simmonds, MD      . MEDLINE mouth rinse  15 mL Mouth Rinse 10 times per day Tat, David, MD      . norepinephrine (LEVOPHED) 46m in 2566mpremix infusion  0-40 mcg/min Intravenous Titrated SoAnders SimmondsMD 150 mL/hr at 06/05/20 0803 40 mcg/min at 06/05/20 0803  . phenylephrine (NEOSYNEPHRINE) 10-0.9 MG/250ML-% infusion  0-400 mcg/min Intravenous Titrated SoAnders SimmondsMD 525 mL/hr at 06/05/20 0800 350 mcg/min at 06/05/20 0800  . piperacillin-tazobactam (ZOSYN) IVPB 3.375 g  3.375 g Intravenous Q8H Adefeso, Oladapo, DO 12.5 mL/hr at 06/05/20 0510 3.375 g at 06/05/20 0510  . potassium chloride 10 mEq in 100 mL IVPB  10 mEq Intravenous Q1 Hr x 6 SoAnders SimmondsMD 100 mL/hr at 06/05/20 0759 10 mEq at 06/05/20 0759  . vancomycin (VANCOCIN) 50 mg/mL oral solution 125  mg  125 mg Per Tube Q6H Tat, DaShanon BrowMD      . vasopressin (PITRESSIN) 20 Units in sodium chloride 0.9 % 100 mL infusion-*FOR SHOCK*  0-0.03 Units/min Intravenous Continuous SoAnders SimmondsMD 9 mL/hr at 06/05/20 0557 0.03 Units/min at 06/05/20 0557    Allergies as of 06/01/2020 - Review Complete 06/21/2020  Allergen Reaction Noted  . Latex Rash 01/02/2015  . Sulfa antibiotics Rash 10/30/2012    Family History  Problem Relation Age of Onset  . Heart attack Mother   . Cancer Mother   . Aneurysm Father     Social History   Socioeconomic History  . Marital status: Married    Spouse name: Not on file  . Number of children: Not on file  . Years of education: Not on file  . Highest education level: Not on file  Occupational History  . Not on file  Tobacco Use  . Smoking status: Never Smoker  . Smokeless tobacco: Never Used  Vaping Use  . Vaping Use: Never used  Substance and Sexual Activity  . Alcohol use: Yes    Alcohol/week: 0.0 standard drinks    Comment: daily-glass of wine  . Drug use: No  . Sexual activity: Not on file  Other Topics Concern  . Not on file  Social History Narrative  . Not on file   Social Determinants of Health   Financial Resource Strain:   . Difficulty of Paying Living Expenses: Not on file  Food Insecurity:   . Worried About RuCharity fundraisern the Last Year: Not on file  . Ran Out of Food in the Last Year: Not on file  Transportation Needs:   . Lack of Transportation (Medical): Not on file  . Lack of Transportation (Non-Medical): Not on file  Physical Activity:   . Days of Exercise per Week: Not on file  . Minutes of Exercise per Session: Not on file  Stress:   . Feeling of Stress : Not on file  Social Connections:   . Frequency of Communication with Friends and Family: Not on file  . Frequency of Social Gatherings with Friends and Family: Not on file  . Attends Religious Services: Not on file  . Active Member of Clubs or  Organizations: Not on file  . Attends ClArchivisteetings: Not on file  . Marital Status: Not on file  Intimate Partner Violence:   .  Fear of Current or Ex-Partner: Not on file  . Emotionally Abused: Not on file  . Physically Abused: Not on file  . Sexually Abused: Not on file    Review of Systems: Limited due to intubation/sedation.  Physical Exam: Vital signs in last 24 hours: Temp:  [98.5 F (36.9 C)-100.3 F (37.9 C)] 99.1 F (37.3 C) (11/05 0332) Pulse Rate:  [28-144] 130 (11/05 0640) Resp:  [14-46] 26 (11/05 0506) BP: (52-166)/(28-120) 166/94 (11/05 0640) SpO2:  [49 %-100 %] 92 % (11/05 0640) FiO2 (%):  [100 %] 100 % (11/05 0331) Weight:  [73.8 kg-76.8 kg] 76.8 kg (11/05 0349)   General:   Well-developed, well-nourished. Wife and daughter at bedside Head:  Normocephalic and atraumatic. Eyes:  Sclera clear, no icterus. Conjunctiva pink. Ears:  Normal auditory acuity. Neck:  Supple; no masses or thyromegaly. Lungs:  Remains on ventilator. Clear throughout to auscultation. No wheezes, crackles, or rhonchi. No acute distress. Heart:  Regular rate and rhythm; no murmurs, clicks, rubs, or gallops. Abdomen:  Firm but not tense, distended. Hypoactive bowel sounds.   Rectal:  Deferred.   Msk:  Symmetrical without gross deformities. Pulses:  Normal bilateral DP pulses noted although feet cool. Extremities:  Without clubbing or edema. Neurologic:  Intubated and sedated Skin:  Intact without significant lesions or rashes.  Intake/Output from previous day: 11/04 0701 - 11/05 0700 In: 4300 [IV Piggyback:4300] Out: -  Intake/Output this shift: No intake/output data recorded.  Lab Results: Recent Labs    06/06/2020 2016  WBC 13.1*  HGB 13.2  HCT 40.9  PLT 314   BMET Recent Labs    06/13/2020 2016 06/05/20 0234  NA 134* 137  K 3.5 3.2*  CL 99 107  CO2 23 18*  GLUCOSE 137* 131*  BUN 23 21  CREATININE 1.51* 1.79*  CALCIUM 8.9 7.5*   LFT Recent Labs     06/17/2020 2016 06/05/20 0234  PROT 6.3* 4.7*  ALBUMIN 3.0* 2.2*  AST 17 57*  ALT 13 37  ALKPHOS 50 50  BILITOT 1.0 0.6   PT/INR Recent Labs    06/25/2020 2016 06/05/20 0234  LABPROT 15.3* 18.1*  INR 1.3* 1.6*   Hepatitis Panel No results for input(s): HEPBSAG, HCVAB, HEPAIGM, HEPBIGM in the last 72 hours. C-Diff Recent Labs    06/15/2020 1939  CDIFFTOX POSITIVE*    Studies/Results: CT ABDOMEN PELVIS W CONTRAST  Result Date: 06/15/2020 CLINICAL DATA:  Abdominal distension, diarrhea X 10 days. Told by GI that if it got worse to go to the ED. EXAM: CT ABDOMEN AND PELVIS WITH CONTRAST TECHNIQUE: Multidetector CT imaging of the abdomen and pelvis was performed using the standard protocol following bolus administration of intravenous contrast. CONTRAST:  76m OMNIPAQUE IOHEXOL 300 MG/ML  SOLN COMPARISON:  MRI abdomen 01/21/2020, CT abdomen pelvis 01/05/2020 FINDINGS: Lower chest: No acute abnormality. Four-vessel coronary artery calcifications status post coronary artery bypass. Hepatobiliary: No focal liver abnormality. Several calcified gallstones are noted within the gallbladder lumen. Otherwise no gallbladder wall thickening or pericholecystic fluid. No biliary dilatation. Pancreas: Redemonstration of a stable 1.5 x 1.2 pancreatic head/uncinate process lesion that was consistent with a in intraductal mucinous papillary neoplasm on MRI abdomen 01/21/2020. No other pancreatic lesion identified. Normal pancreatic contour. No surrounding inflammatory changes. No main pancreatic ductal dilatation. Spleen: Normal in size. Associated calcified density likely represents sequelae of prior granulomatous disease. Suggestion of a splenule is again noted (2:28). Adrenals/Urinary Tract: No adrenal nodule bilaterally. Nonspecific bilateral perinephric stranding. Bilateral kidneys enhance  symmetrically. No hydronephrosis. No hydroureter. The urinary bladder is unremarkable. Stomach/Bowel: Stomach is within  normal limits. No evidence of small bowel wall thickening or dilatation. Extensive large bowel wall circumferential thickening and pericolonic fat stranding that is much more prominent of the rectosigmoid colon. No large bowel dilatation. No pneumatosis. Appendix appears normal. Vascular/Lymphatic: No abdominal aorta or iliac aneurysm. Focal dilatation of the infrarenal abdominal aorta with a caliber of 2.6 cm (5:55). Atherosclerotic plaque of the aorta and its branches. No abdominal, pelvic, or inguinal lymphadenopathy. Reproductive: Prostate resection. Surgical clips associated with the right testicle likely represents vasectomy. Other: Trace fluid within the abdomen and pelvis. No intraperitoneal free gas. No organized fluid collection. Musculoskeletal: Small fat and fluid containing right inguinal hernia. Possible tiny fat containing supraumbilical hernia (5:78). Diffusely decreased bone density. Posterolateral fusion of the L4 through S1 levels with inter pedicular screw and rod fixation as well as interbody cages. Multilevel severe degenerative changes of the spine with intervertebral vacuum phenomenon. No suspicious lytic or blastic osseous lesions. No acute displaced fracture. Multilevel degenerative changes of the spine. IMPRESSION: 1. Marked pancolitis that is worse of the rectosigmoid colon. Etiology could be infectious, inflammatory, ischemic. No associated bowel obstruction or perforation. Correlate with stool cultures. 2. Other imaging findings of potential clinical significance: Cholelithiasis with no acute cholecystitis. 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 Small right inguinal hernia containing fat and fluid. Aortic Atherosclerosis (ICD10-I70.0). Electronically Signed   By: Iven Finn M.D.   On: 06/10/2020 22:57   DG Chest Port 1 View  Result Date: 06/05/2020 CLINICAL DATA:  Post intubation EXAM: PORTABLE CHEST 1 VIEW COMPARISON:  06/18/2020 FINDINGS:  Endotracheal tube is 4.6 cm above the carina. Patchy bilateral upper lobe airspace opacities. Heart is normal size. Prior CABG. No effusions or pneumothorax. No acute bony abnormality. IMPRESSION: Endotracheal tube 4.6 cm above the carina. Patchy bilateral upper lobe airspace opacities, new since prior study. Electronically Signed   By: Rolm Baptise M.D.   On: 06/05/2020 00:17   DG Chest Port 1 View  Result Date: 06/30/2020 CLINICAL DATA:  Code sepsis.  Fever, weakness, diarrhea EXAM: PORTABLE CHEST 1 VIEW COMPARISON:  Radiograph 02/04/2020.  CT 04/08/2016 FINDINGS: Post median sternotomy and CABG. Coronary stent also visualized. Heart is normal in size aortic atherosclerosis. Mild elevation of right hemidiaphragm. There are ill-defined streaky opacities at both lung bases. No pulmonary edema. No confluent consolidation. No pleural fluid or pneumothorax. The bones are under mineralized without acute osseous abnormalities. IMPRESSION: 1. Ill-defined streaky opacities at both lung bases, favor atelectasis over pneumonia. 2. Post CABG. Normal heart size. Aortic Atherosclerosis (ICD10-I70.0). Electronically Signed   By: Keith Rake M.D.   On: 06/11/2020 20:31   DG Chest Port 1V same Day  Result Date: 06/05/2020 CLINICAL DATA:  OG tube in central line placement EXAM: PORTABLE CHEST 1 VIEW COMPARISON:  CT abdomen pelvis 06/29/2020, radiograph 06/05/2020 FINDINGS: Endotracheal tube tip terminates in the mid trachea, 4 cm from the carina. A transesophageal tube tip is seen coiling at the level of the GE junction possibly folded within the distal thoracic esophagus or within the gastric fundus. Could consider advancing at least 3 cm and reimaging. Right IJ catheter tip in the mid SVC. Additional support devices including pacer pads and telemetry leads overlie the chest. Prior sternotomy and CABG with vascular stenting. Cardiac silhouette is similar to prior portable supine radiography accounting for differences  in lung inflation. Calcified aorta is again noted. Diffuse interstitial and patchy  airspace opacities appear to be increasing within the upper lungs with some atelectatic changes elsewhere. No pneumothorax or visible effusion. No acute osseous or soft tissue abnormality. IMPRESSION: 1. Endotracheal tube tip terminates in the mid trachea, 4 cm from the carina. 2. A transesophageal tube tip is seen coiling at the level of the GE junction possibly folded within the distal thoracic esophagus or within the gastric fundus. Could consider advancing at least 3 cm and reimaging. 3. Increasing upper lobe opacities with some likely atelectatic changes in the lung bases. 4.  Aortic Atherosclerosis (ICD10-I70.0). Electronically Signed   By: Lovena Le M.D.   On: 06/05/2020 01:37    Impression: Unfortunate situation of a 79 year old Gregory with a history of C. difficile who presented to the outpatient GI office with acute onset diarrhea x10 days.  Labs and stool studies were recommended.  The patient worsened by evening with a fever and weakness and presented to the emergency department.  He was found to have acute C. difficile colitis and sepsis.  Unfortunately he coded in the emergency department and ended up with hypotension, persistent tachycardia, had a triple lumen central line placed, ET tube place/intubation on the ventilator, A-line placed and is now in the ICU on phenylephrine drip.  Fulminant C. difficile colitis: C. difficile quick scan shows positive C. difficile, deemed fulminant due to hypotension and shock.  Leukocytosis with a white blood cell count of 13.  He is currently on an amiodarone drip, Levophed drip, Neo-Synephrine drip.  He received single dose Rocephin 2 g, single dose Cipro 400 mg, single dose Flagyl 500 mg.  He is on IV Zosyn for presumed sepsis, oral vancomycin 125 mg per NG tube for C. Difficile.  Acute kidney injury: Likely due to sepsis, persistent diarrhea, dehydration.  Creatinine  initially near baseline at 1.51 (baseline typically around 1.3) but is since increased earlier this morning to 1.79 with an GFR of 38.  Unfortunately he has quite a grave prognosis. His wife and daughter were at bedside. We discussed CDiff colitis as likely etiology of the majority of his issues currently. Reviewed plan to increase Vancomycin dose, add Flagyl. Will take it "one day at a time" but he is very ill.  Plan: 1. Increase vancomycin to 500 mg q 6 hours (will discuss antibiotic changes with pharmacist) 2. Add Flagyl 500 mg q 8 hour 3. Pressor support per hospitalist 4. Appreciate pulm/critical care management of ventilator 5. Recheck CBC in the morning 6. Will follow for full GI path panel results 7. Supportive measures 8. NPO   Thank you for allowing [place] to participate in the care of Ramzey F Spatz  Eulonda Andalon, DNP, AGNP-C Adult & Gerontological Nurse Practitioner [facility]   LOS: 1 day     06/05/2020, 8:16 AM

## 2020-06-05 NOTE — ED Notes (Signed)
Patient remains in trendelenburg position.  BP 85 60 with pulse of 139.  Patient remains restless despite upward titration of fentanyl.

## 2020-06-05 NOTE — ED Notes (Signed)
Patient's spouse briefly to bedside to see patient.  Spouse updated regarding plan of care.

## 2020-06-05 NOTE — ED Notes (Signed)
ED Code Note: Summary of Resuscitative Efforts   06/05/2020 at 2343:  Patient noted to be in v-fib on monitor.  Patient found to be unresponsive to painful stimuli and without palpable carotid pulse.  CPR initiated and Code Blue for cardiac arrest called.    2344:  Patient placed on backboard.  Defibrillator pads applied to patient's bare chest.  1 shock delivered.    2346:  1 mg epinephrine IV administered.  2348:  1 mg epinephrine IV administered.  2349:  Thready pulse palpated.  Patient in persistent v-tach with agonal respirations.  2351:  1 shock delivered.  2352: Amiodarone 150 mg IV push given.  2355:  Amiodarone 300 mg IV push given.  2355:  25 succinylcholine IV push given.  2357:  Patient intubated using 7.5 et tube measuring 25 cm at the lip.    06/05/2020 at 0001:  Amiodarone IV drip initiated at 60mg /hr per Dr. Rosalene Billings recommendation.   0002:  Portable chest xray performed.

## 2020-06-05 NOTE — ED Notes (Signed)
Assisted Dr. Betsey Holiday with insertion of triple lumen central line to R neck.  Time-out completed prior to insertion.  Patient tolerated well.

## 2020-06-05 NOTE — Progress Notes (Signed)
PROGRESS NOTE  MERVIL WACKER LPF:[id number] DOB: [birth year] DOA: 06/20/2020 PCP: Celene Squibb, MD  Brief History:  79 y.o. male with a past medical history of atrial flutter, CAD status post CABG in 2000 and NST in 2019 with mild to moderate infarct ischemia and medical management recommended, hypertension, hyperlipidemia, metastatic prostate cancer presented with fever, abdominal pain and generalized weakness.   The patient was seen yesterday (06/13/2020) by GI,  Dr. Jenetta Downer for acute diarrhea.  Patient has history of C. difficile infection status post vancomycin treatment x1.  At that point he noted 10 days of watery stools x3-6 episodes a day despite antidiarrheals.  Denied hematochezia or melena, associated lower abdominal discomfort with stooling.  No hx of recent abx.   Later that evening the patient presented to the emergency department with complaints of worsening including weakness, fever.  He was noted to be tachycardic at 144, although BP good at 133/84 initially.  Low-grade temp noted of 100.3.    However, while in the emergency room the patient went into spontaneous V. fib arrest and a CODE BLUE was initiated.  NG tube was placed, triple-lumen catheter to the right neck was placed, the patient was intubated and placed on the ventilator due to agonal breathing even with ROSC.  The code lasted approximately 10 to 12 minutes with 2 rounds of epinephrine, 1 shock, amiodarone IV push.  He was subsequently started on an IV amiodarone drip.  Postcode noted hypotension and tachycardia with BP initially 85/60, heart rate 139.  The patient was transferred to the ICUCT scan showed pancolonic colitis as well as stable but known pancreatic cancer.  He was placed on phenylephrine and subsequently added levophed and vasopressin due to persistent hypotension, A-line placed.  Labs since admission show C. difficile quick scan positive for antigen and toxin, SARS-CoV-2 negative, GI pathogen panel in  process, blood cultures negative to date, lactic acid normal at 1.9, acute kidney injury with mild bump in serum creatinine to 1.51.  Leukocytosis with a white blood cell count of 13.1, hemoglobin normal, INR mildly elevated at 1.3 initially that has since increased to 1.6. GI, PCCM, cardiology were consulted to assist.    Assessment/Plan: Septic Shock -present on admission -due to Cdiff and pneumonia -continue vanco po, IV metronidazole -PCT 2.79 -continue levophed, phenylephrine, vasopressin -PCCM following -continue hydrocortisone  C diff Colitis -continue oral vanco/IV flagyl -GI following -CT shows pancolitis  Acute on chronic renal failure--CKD 3a -baseline creatinine 1.2-1.4 -serum creatinine peaked 1.79 -due to sepsis -continue IVF  Lobar pneumonia -personally reviewed CXR--increasing upper lobe opacities on CXR -likely a component of aspiration  Acute Respiratory Failure with Hypoxia -continue mechanical ventilation -PCCM following  Vfib Arrest -ventricular fibrillation arrest in the setting of coming in with significant diarrhea and pancolitis. He was shocked twice and received Epinephrine twice along with Amiodarone.  -Suspect severe systemic demand from severe abdominal infection in setting of chronically ischemic heart led to the arrhythmia -not be a cath candidate. If survives and recovers from this event would need an ischemic evaluation -appreciate cardiology -continue amiodarone  CAD/Elevated Troponin - He is s/p CABG in 2000 with NST in 10/2017 showing mild to moderate peri-infarct ischemia with medical management recommended. - Initial HS Troponin 12 with repeat of 818.   Paroxysmal Atrial Flutter - Diagnosed in 12/2019 while admitted for sepsis. He was on Eliquis prior to admission which is currently held. -Receiving DVT prophylaxis Heparin.  -  On Amiodarone as outlined above  Prostate Cancer -hx of radical prostatectomy and is currently on lupron  for metastatic prostate cancer with bone mets -urology consulted and placed foley  Goals of Care -Advance care planning, including the explanation and discussion of advance directives was carried out with the patient and family.  Code status including explanations of "Full Code" and "DNR" and alternatives were discussed in detail.  Discussion of end-of-life issues including but not limited palliative care, hospice care and the concept of hospice, other end-of-life care options, power of attorney for health care decisions, living wills, and physician orders for life-sustaining treatment were also discussed with the patient and family.  Total face to face time 16 minutes. -full code confirmed with daughter    Status is: Inpatient  Remains inpatient appropriate because:IV treatments appropriate due to intensity of illness or inability to take PO  Hemodynamically unstable   Dispo: The patient is from: Home              Anticipated d/c is to: Unclear presently              Anticipated d/c date is: > 3 days              Patient currently is not medically stable to d/c.        Family Communication:  Daughter updated 11/5  Consultants:  GI, cardiology, PCCM  Code Status:  FULL   DVT Prophylaxis:  [place] Heparin    Procedures: As Listed in Progress Note Above  Antibiotics: vanco po 11/5>> Metronidazole IV 11/5>> Zosyn 11/5>>      Subjective: Patient sedated on vent.  Intermittently agitated.  No vomiting.  Objective: Vitals:   06/05/20 1145 06/05/20 1200 06/05/20 1215 06/05/20 1230  BP:      Pulse: (!) 121 (!) 121 (!) 120 (!) 120  Resp:      Temp:  99 F (37.2 C)    TempSrc:  Oral    SpO2: 93% 93% 93% 94%  Weight:      Height:        Intake/Output Summary (Last 24 hours) at 06/05/2020 1401 Last data filed at 06/05/2020 1200 Gross per 24 hour  Intake 7418.56 ml  Output --  Net 7418.56 ml   Weight change:  Exam:   General:  Pt is sedated on vent  HEENT: No  icterus, No thrush, No neck mass, [place]/AT  Cardiovascular: RRR, S1/S2, no rubs, no gallops  Respiratory: bibasilar rales. No wheeze  Abdomen: Soft/+BS, + distended, no guarding  Extremities: No edema, No lymphangitis, No petechiae, No rashes, no synovitis   Data Reviewed: I have personally reviewed following labs and imaging studies Basic Metabolic Panel: Recent Labs  Lab 06/03/2020 2016 06/05/20 0234  NA 134* 137  K 3.5 3.2*  CL 99 107  CO2 23 18*  GLUCOSE 137* 131*  BUN 23 21  CREATININE 1.51* 1.79*  CALCIUM 8.9 7.5*  MG  --  1.7  PHOS  --  3.6   Liver Function Tests: Recent Labs  Lab 06/03/2020 2016 06/05/20 0234  AST 17 57*  ALT 13 37  ALKPHOS 50 50  BILITOT 1.0 0.6  PROT 6.3* 4.7*  ALBUMIN 3.0* 2.2*   No results for input(s): LIPASE, AMYLASE in the last 168 hours. No results for input(s): AMMONIA in the last 168 hours. Coagulation Profile: Recent Labs  Lab 06/26/2020 2016 06/05/20 0234  INR 1.3* 1.6*   CBC: Recent Labs  Lab 06/16/2020 2016  WBC  13.1*  NEUTROABS 9.3*  HGB 13.2  HCT 40.9  MCV 99.5  PLT 314   Cardiac Enzymes: No results for input(s): CKTOTAL, CKMB, CKMBINDEX, TROPONINI in the last 168 hours. BNP: Invalid input(s): POCBNP CBG: Recent Labs  Lab 06/05/20 0750 06/05/20 1138  GLUCAP 135* 199*   HbA1C: No results for input(s): HGBA1C in the last 72 hours. Urine analysis:    Component Value Date/Time   COLORURINE YELLOW 02/04/2020 1250   APPEARANCEUR Clear 05/20/2020 1502   LABSPEC 1.017 02/04/2020 1250   PHURINE 5.0 02/04/2020 1250   GLUCOSEU Negative 05/20/2020 1502   HGBUR NEGATIVE 02/04/2020 1250   BILIRUBINUR Negative 05/20/2020 1502   KETONESUR NEGATIVE 02/04/2020 1250   PROTEINUR 1+ (A) 05/20/2020 1502   PROTEINUR 30 (A) 02/04/2020 1250   UROBILINOGEN 0.2 10/29/2007 2036   NITRITE Negative 05/20/2020 1502   NITRITE NEGATIVE 02/04/2020 1250   LEUKOCYTESUR Negative 05/20/2020 1502   LEUKOCYTESUR NEGATIVE 02/04/2020 1250    Sepsis Labs: _0 (procalcitonin:4,lacticidven:4) ) Recent Results (from the past 240 hour(s))  Respiratory Panel by RT PCR (Flu A&B, Covid) - Nasopharyngeal Swab     Status: None   Collection Time: 06/25/2020  7:37 PM   Specimen: Nasopharyngeal Swab  Result Value Ref Range Status   SARS Coronavirus 2 by RT PCR NEGATIVE NEGATIVE Final    Comment: (NOTE) SARS-CoV-2 target nucleic acids are NOT DETECTED.  The SARS-CoV-2 RNA is generally detectable in upper respiratoy specimens during the acute phase of infection. The lowest concentration of SARS-CoV-2 viral copies this assay can detect is 131 copies/mL. A negative result does not preclude SARS-Cov-2 infection and should not be used as the sole basis for treatment or other patient management decisions. A negative result may occur with  improper specimen collection/handling, submission of specimen other than nasopharyngeal swab, presence of viral mutation(s) within the areas targeted by this assay, and inadequate number of viral copies (<131 copies/mL). A negative result must be combined with clinical observations, patient history, and epidemiological information. The expected result is Negative.  Fact Sheet for Patients:  PinkCheek.be  Fact Sheet for Healthcare Providers:  GravelBags.it  This test is no t yet approved or cleared by the [place] FDA and  has been authorized for detection and/or diagnosis of SARS-CoV-2 by FDA under an Emergency Use Authorization (EUA). This EUA will remain  in effect (meaning this test can be used) for the duration of the COVID-19 declaration under Section 564(b)(1) of the Act, 21 U.S.C. section 360bbb-3(b)(1), unless the authorization is terminated or revoked sooner.     Influenza A by PCR NEGATIVE NEGATIVE Final   Influenza B by PCR NEGATIVE NEGATIVE Final    Comment: (NOTE) The Xpert Xpress SARS-CoV-2/FLU/RSV assay is  intended as an aid in  the diagnosis of influenza from Nasopharyngeal swab specimens and  should not be used as a sole basis for treatment. Nasal washings and  aspirates are unacceptable for Xpert Xpress SARS-CoV-2/FLU/RSV  testing.  Fact Sheet for Patients: PinkCheek.be  Fact Sheet for Healthcare Providers: GravelBags.it  This test is not yet approved or cleared by the [place] FDA and  has been authorized for detection and/or diagnosis of SARS-CoV-2 by  FDA under an Emergency Use Authorization (EUA). This EUA will remain  in effect (meaning this test can be used) for the duration of the  Covid-19 declaration under Section 564(b)(1) of the Act, 21  U.S.C. section 360bbb-3(b)(1), unless the authorization is  terminated or revoked. Performed at [facility], [address],  [place], [place] [address]   Gastrointestinal Panel by PCR , Stool     Status: None   Collection Time: 06/15/2020  7:38 PM   Specimen: STOOL  Result Value Ref Range Status   Campylobacter species NOT DETECTED NOT DETECTED Final   Plesimonas shigelloides NOT DETECTED NOT DETECTED Final   Salmonella species NOT DETECTED NOT DETECTED Final   Yersinia enterocolitica NOT DETECTED NOT DETECTED Final   Vibrio species NOT DETECTED NOT DETECTED Final   Vibrio cholerae NOT DETECTED NOT DETECTED Final   Enteroaggregative E coli (EAEC) NOT DETECTED NOT DETECTED Final   Enteropathogenic E coli (EPEC) NOT DETECTED NOT DETECTED Final   Enterotoxigenic E coli (ETEC) NOT DETECTED NOT DETECTED Final   Shiga like toxin producing E coli (STEC) NOT DETECTED NOT DETECTED Final   Shigella/Enteroinvasive E coli (EIEC) NOT DETECTED NOT DETECTED Final   Cryptosporidium NOT DETECTED NOT DETECTED Final   Cyclospora cayetanensis NOT DETECTED NOT DETECTED Final   Entamoeba histolytica NOT DETECTED NOT DETECTED Final   Giardia lamblia NOT DETECTED NOT DETECTED Final   Adenovirus  F40/41 NOT DETECTED NOT DETECTED Final   Astrovirus NOT DETECTED NOT DETECTED Final   Norovirus GI/GII NOT DETECTED NOT DETECTED Final   Rotavirus A NOT DETECTED NOT DETECTED Final   Sapovirus (I, II, IV, and V) NOT DETECTED NOT DETECTED Final    Comment: Performed at [facility], [address] [place], [place] [address]  C Difficile Quick Screen w PCR reflex     Status: Abnormal   Collection Time: 06/02/2020  7:39 PM   Specimen: STOOL  Result Value Ref Range Status   C Diff antigen POSITIVE (A) NEGATIVE Final   C Diff toxin POSITIVE (A) NEGATIVE Final    Comment: CRITICAL RESULT CALLED TO, READ BACK BY AND VERIFIED WITH: A POWELL,RN_0  06/18/2020 MKELLY    C Diff interpretation Toxin producing C. difficile detected.  Final    Comment: Performed at [facility], [address] [place], [place] [address]  Blood Culture (routine x 2)     Status: None (Preliminary result)   Collection Time: 06/03/2020  8:14 PM   Specimen: BLOOD LEFT HAND  Result Value Ref Range Status   Specimen Description BLOOD LEFT HAND  Final   Special Requests   Final    BOTTLES DRAWN AEROBIC AND ANAEROBIC Blood Culture adequate volume   Culture   Final    NO GROWTH < 12 HOURS Performed at [facility], [address] [place], [place] [address]    Report Status PENDING  Incomplete  Blood Culture (routine x 2)     Status: None (Preliminary result)   Collection Time: 06/21/2020  8:16 PM   Specimen: BLOOD RIGHT HAND  Result Value Ref Range Status   Specimen Description BLOOD RIGHT HAND  Final   Special Requests   Final    BOTTLES DRAWN AEROBIC AND ANAEROBIC Blood Culture adequate volume   Culture   Final    NO GROWTH < 12 HOURS Performed at [facility], [address] [place], [place] [address]    Report Status PENDING  Incomplete  MRSA PCR Screening     Status: None   Collection Time: 06/05/20  3:15 AM   Specimen: Nasal Mucosa; Nasopharyngeal  Result Value Ref Range Status   MRSA by PCR NEGATIVE  NEGATIVE Final    Comment:        The GeneXpert MRSA Assay (FDA approved for NASAL specimens only), is one component of a comprehensive MRSA  colonization surveillance program. It is not intended to diagnose MRSA infection nor to guide or monitor treatment for MRSA infections. Performed at [facility], [address], [place], [place] [address]      Scheduled Meds: . aspirin  150 mg Rectal Daily  . chlorhexidine gluconate (MEDLINE KIT)  15 mL Mouth Rinse BID  . Chlorhexidine Gluconate Cloth  6 each Topical Daily  . heparin  5,000 Units Subcutaneous Q8H  . hydrocortisone sod succinate (SOLU-CORTEF) inj  50 mg Intravenous Q6H  . mouth rinse  15 mL Mouth Rinse 10 times per day  . pantoprazole (PROTONIX) IV  40 mg Intravenous Q24H  . vancomycin  500 mg Per Tube Q6H  . vancomycin (VANCOCIN) rectal ENEMA  500 mg Rectal Q6H   Continuous Infusions: . sodium chloride    . amiodarone 60 mg/hr (06/05/20 1336)  . fentaNYL infusion INTRAVENOUS 125 mcg/hr (06/05/20 0552)  . metronidazole 100 mL/hr at 06/05/20 1200  . norepinephrine (LEVOPHED) Adult infusion 40 mcg/min (06/05/20 1343)  . phenylephrine (NEO-SYNEPHRINE) Adult infusion 400 mcg/min (06/05/20 1134)  . piperacillin-tazobactam (ZOSYN)  IV Stopped (06/05/20 0919)  . vasopressin 0.03 Units/min (06/05/20 1200)    Procedures/Studies: CT ABDOMEN PELVIS W CONTRAST  Result Date: 06/20/2020 CLINICAL DATA:  Abdominal distension, diarrhea X 10 days. Told by GI that if it got worse to go to the ED. EXAM: CT ABDOMEN AND PELVIS WITH CONTRAST TECHNIQUE: Multidetector CT imaging of the abdomen and pelvis was performed using the standard protocol following bolus administration of intravenous contrast. CONTRAST:  95m OMNIPAQUE IOHEXOL 300 MG/ML  SOLN COMPARISON:  MRI abdomen 01/21/2020, CT abdomen pelvis 01/05/2020 FINDINGS: Lower chest: No acute abnormality. Four-vessel coronary artery calcifications status post coronary artery bypass.  Hepatobiliary: No focal liver abnormality. Several calcified gallstones are noted within the gallbladder lumen. Otherwise no gallbladder wall thickening or pericholecystic fluid. No biliary dilatation. Pancreas: Redemonstration of a stable 1.5 x 1.2 pancreatic head/uncinate process lesion that was consistent with a in intraductal mucinous papillary neoplasm on MRI abdomen 01/21/2020. No other pancreatic lesion identified. Normal pancreatic contour. No surrounding inflammatory changes. No main pancreatic ductal dilatation. Spleen: Normal in size. Associated calcified density likely represents sequelae of prior granulomatous disease. Suggestion of a splenule is again noted (2:28). Adrenals/Urinary Tract: No adrenal nodule bilaterally. Nonspecific bilateral perinephric stranding. Bilateral kidneys enhance symmetrically. No hydronephrosis. No hydroureter. The urinary bladder is unremarkable. Stomach/Bowel: Stomach is within normal limits. No evidence of small bowel wall thickening or dilatation. Extensive large bowel wall circumferential thickening and pericolonic fat stranding that is much more prominent of the rectosigmoid colon. No large bowel dilatation. No pneumatosis. Appendix appears normal. Vascular/Lymphatic: No abdominal aorta or iliac aneurysm. Focal dilatation of the infrarenal abdominal aorta with a caliber of 2.6 cm (5:55). Atherosclerotic plaque of the aorta and its branches. No abdominal, pelvic, or inguinal lymphadenopathy. Reproductive: Prostate resection. Surgical clips associated with the right testicle likely represents vasectomy. Other: Trace fluid within the abdomen and pelvis. No intraperitoneal free gas. No organized fluid collection. Musculoskeletal: Small fat and fluid containing right inguinal hernia. Possible tiny fat containing supraumbilical hernia (25:36. Diffusely decreased bone density. Posterolateral fusion of the L4 through S1 levels with inter pedicular screw and rod fixation as well  as interbody cages. Multilevel severe degenerative changes of the spine with intervertebral vacuum phenomenon. No suspicious lytic or blastic osseous lesions. No acute displaced fracture. Multilevel degenerative changes of the spine. IMPRESSION: 1. Marked pancolitis that is worse of the rectosigmoid colon. Etiology could be infectious,  inflammatory, ischemic. No associated bowel obstruction or perforation. Correlate with stool cultures. 2. Other imaging findings of potential clinical significance: Cholelithiasis with no acute cholecystitis. 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 Small right inguinal hernia containing fat and fluid. Aortic Atherosclerosis (ICD10-I70.0). Electronically Signed   By: Iven Finn M.D.   On: 06/03/2020 22:57   DG Chest Port 1 View  Result Date: 06/05/2020 CLINICAL DATA:  Post intubation EXAM: PORTABLE CHEST 1 VIEW COMPARISON:  06/16/2020 FINDINGS: Endotracheal tube is 4.6 cm above the carina. Patchy bilateral upper lobe airspace opacities. Heart is normal size. Prior CABG. No effusions or pneumothorax. No acute bony abnormality. IMPRESSION: Endotracheal tube 4.6 cm above the carina. Patchy bilateral upper lobe airspace opacities, new since prior study. Electronically Signed   By: Rolm Baptise M.D.   On: 06/05/2020 00:17   DG Chest Port 1 View  Result Date: 06/08/2020 CLINICAL DATA:  Code sepsis.  Fever, weakness, diarrhea EXAM: PORTABLE CHEST 1 VIEW COMPARISON:  Radiograph 02/04/2020.  CT 04/08/2016 FINDINGS: Post median sternotomy and CABG. Coronary stent also visualized. Heart is normal in size aortic atherosclerosis. Mild elevation of right hemidiaphragm. There are ill-defined streaky opacities at both lung bases. No pulmonary edema. No confluent consolidation. No pleural fluid or pneumothorax. The bones are under mineralized without acute osseous abnormalities. IMPRESSION: 1. Ill-defined streaky opacities at both lung bases, favor  atelectasis over pneumonia. 2. Post CABG. Normal heart size. Aortic Atherosclerosis (ICD10-I70.0). Electronically Signed   By: Keith Rake M.D.   On: 06/18/2020 20:31   DG Chest Port 1V same Day  Result Date: 06/05/2020 CLINICAL DATA:  OG tube in central line placement EXAM: PORTABLE CHEST 1 VIEW COMPARISON:  CT abdomen pelvis 06/16/2020, radiograph 06/05/2020 FINDINGS: Endotracheal tube tip terminates in the mid trachea, 4 cm from the carina. A transesophageal tube tip is seen coiling at the level of the GE junction possibly folded within the distal thoracic esophagus or within the gastric fundus. Could consider advancing at least 3 cm and reimaging. Right IJ catheter tip in the mid SVC. Additional support devices including pacer pads and telemetry leads overlie the chest. Prior sternotomy and CABG with vascular stenting. Cardiac silhouette is similar to prior portable supine radiography accounting for differences in lung inflation. Calcified aorta is again noted. Diffuse interstitial and patchy airspace opacities appear to be increasing within the upper lungs with some atelectatic changes elsewhere. No pneumothorax or visible effusion. No acute osseous or soft tissue abnormality. IMPRESSION: 1. Endotracheal tube tip terminates in the mid trachea, 4 cm from the carina. 2. A transesophageal tube tip is seen coiling at the level of the GE junction possibly folded within the distal thoracic esophagus or within the gastric fundus. Could consider advancing at least 3 cm and reimaging. 3. Increasing upper lobe opacities with some likely atelectatic changes in the lung bases. 4.  Aortic Atherosclerosis (ICD10-I70.0). Electronically Signed   By: Lovena Le M.D.   On: 06/05/2020 01:37    Orson Eva, DO  [facility]  If 7PM-7AM, please contact night-coverage [email or web address] Password [email or web address] 06/05/2020, 2:01 PM   LOS: 1 day

## 2020-06-05 NOTE — Progress Notes (Signed)
Definity was given by patient's nurse Rosario Jacks, RN via triple lumen cath.  Alvino Chapel, RCS

## 2020-06-05 NOTE — Progress Notes (Signed)
Echo shows LVEF 40%, global hypokinesis, no severe focal wall motion abnormality to suggest a territorial infarct. Likely global dysfunction after cardiac arrest. COOX was low at 52%, will start dobutamine at 2 without titration, repeat coox in AM  I am on call at [facility] over the weekend, can message me about this patient if needed   Carlyle Dolly MD

## 2020-06-05 NOTE — Procedures (Signed)
Arterial Catheter Insertion Procedure Note  Samuel Gregory  [id number]  1941/03/14  Date:06/05/20  Time:04:35 AM    Provider Performing: Eliane Decree    Procedure: Insertion of Arterial Line [phone]) with US guidance ([id number])   Indication(s) Blood pressure monitoring and/or need for frequent ABGs  Consent Unable to obtain consent due to emergent nature of procedure.  Anesthesia None   Time Out Verified patient identification, verified procedure, site/side was marked, verified correct patient position, special equipment/implants available, medications/allergies/relevant history reviewed, required imaging and test results available.   Sterile Technique Maximal sterile technique including full sterile barrier drape, hand hygiene, sterile gown, sterile gloves, mask, hair covering, sterile ultrasound probe cover (if used).   Procedure Description Area of catheter insertion was cleaned with chlorhexidine and draped in sterile fashion. With real-time ultrasound guidance an arterial catheter was placed into the right radial artery.  Appropriate arterial tracings confirmed on monitor.     Complications/Tolerance None; patient tolerated the procedure well.   EBL Minimal   Specimen(s) None

## 2020-06-05 NOTE — ED Provider Notes (Addendum)
.  Central Line  Date/Time: 06/05/2020 12:57 AM Performed by: Orpah Greek, MD Authorized by: Orpah Greek, MD   Consent:    Consent obtained:  Emergent situation Universal protocol:    Required blood products, implants, devices, and special equipment available: yes     Site/side marked: yes     Immediately prior to procedure, a time out was called: yes     Patient identity confirmed:  Hospital-assigned identification number Pre-procedure details:    Hand hygiene: Hand hygiene performed prior to insertion     Sterile barrier technique: All elements of maximal sterile technique followed     Skin preparation:  2% chlorhexidine   Skin preparation agent: Skin preparation agent completely dried prior to procedure   Anesthesia (see MAR for exact dosages):    Anesthesia method:  Local infiltration   Local anesthetic:  Lidocaine 1% w/o epi Procedure details:    Location:  R internal jugular   Site selection rationale:  Compressible vein, lower likelihood of infection   Patient position:  Trendelenburg   Procedural supplies:  Triple lumen   Catheter size:  7.5 Fr   Landmarks identified: yes     Ultrasound guidance: yes     Sterile ultrasound techniques: Sterile gel and sterile probe covers were used     Number of attempts:  1   Successful placement: yes   Post-procedure details:    Post-procedure:  Dressing applied and line sutured   Assessment:  Blood return through all ports, free fluid flow, no pneumothorax on x-ray and placement verified by x-ray   Patient tolerance of procedure:  Tolerated well, no immediate complications Procedure Name: Intubation Date/Time: 06/05/2020 11:00 PM Performed by: Orpah Greek, MD Pre-anesthesia Checklist: Patient identified, Emergency Drugs available, Suction available, Patient being monitored and Timeout performed Oxygen Delivery Method: Ambu bag Preoxygenation: Pre-oxygenation with 100% oxygen Induction Type: Rapid  sequence Ventilation: Mask ventilation without difficulty Laryngoscope Size: Glidescope and 3 Grade View: Grade I Tube size: 7.5 mm Number of attempts: 3 Placement Confirmation: ETT inserted through vocal cords under direct vision,  Breath sounds checked- equal and bilateral and CO2 detector Tube secured with: ETT holder Dental Injury: Injury to lip  Difficulty Due To: Difficulty was unanticipated and Difficult Airway- due to anterior larynx Comments: Initial attempts with direct laryngoscopy were unsuccessful due to anterior larynx, glidescope intubation performed without difficulty        Orpah Greek, MD 06/05/20 0174    Orpah Greek, MD 06/05/20 9449    Orpah Greek, MD 06/05/20 2303

## 2020-06-05 NOTE — Consult Note (Addendum)
Cardiology Consult    Patient ID: Samuel Gregory; [id number]; 1940/12/27   Admit date: 06/26/2020 Date of Consult: 06/05/2020  Primary Care Provider: Celene Squibb, MD Primary Cardiologist: Rozann Lesches, MD   Patient Profile    Samuel Gregory is a 79 y.o. male with past medical history of CAD (s/p CABG in 2000, NST in 10/2017 showing mild to moderate peri-infarct ischemia with medical management recommended), paroxysmal atrial flutter (diagnosed in 12/2019 while admitted for sepsis and found to have a new liver lesion), HTN, HLD and prostate cancer (with bone mets) who is being seen today for the evaluation of VF arrest at the request of Dr. Carles Collet.   History of Present Illness    Samuel Gregory was last examined by myself in 03/2020 and reported his activity was limited secondary to back pain but denied any recent chest pain, dyspnea on exertion or palpitations. He was continued on his current medication regimen including Lopressor 54m BID, Crestor 454mdaily and Eliquis 20m40mID.   He presented to [facility] 06/09/2020 for evaluation of progressive diarrhea and abdominal pain for the past 10 days. While in the ED undergoing workup, his telemetry showed ventricular fibrillation and CODE BLUE was called around 2343. He was unresponsive and CPR was initiate with defibrillator pads placed. He was shocked twice and received Epi 1mg30mice along with Amiodarone 150mg18mlowed by 300mg 41m ROSC. Was hypotensive and tachycardiac following the CODE. Was intubated while in the ED as well.   Initial labs showed WBC 13.1, Hgb 13.2, platelets 314, Na+ 134, K+ 3.5, creatinine 1.51, AST 17 and ALT 13. Lactic Acid 1.9. Blood cultures pending. Positive for C.difficile. Initial HS Troponin 12 with repeat during early AM hours at 818. CXR initially showed atelectasis. CT Abdomen showed pancolitis which was more prominent along the rectosigmoid colon.   He has been started on Zosyn and Vancomycin for his  pancolitis. Given his persistent hypotension, he is now on Levophed at 40 mcg/min, Neosynephrine at 375 mcg/min and was started on Vasopressin around 0600 which is at 9 mL/hr. Remains tachycardiac with HR in the 110's to 120's. On Amiodarone 60 mg/hr.  The patient is currently intubated and sedated. History is obtained from the patient's wife and daughter who are at the bedside. She reports he had been experiencing progressive diarrhea for the past 2 weeks. Had a normal diet and was consuming solids and liquids until yesterday when he was only able to consume clear liquids. He did not report any recent chest pain or dyspnea. Just felt weak and fatigued but was still working regularly.   Past Medical History:  Diagnosis Date  . Arthritis   . Atrial flutter (HCC)  Samuel [facility]. diagnosed in 12/2019 while admitted for sepsis  . Collagen vascular disease (HCC)  Samuel Gregory atherosclerosis of native coronary artery    a. s/p CABG in 2000 b. NST in 10/2017 showing mild to moderate per-infarct ischemia with medical management recommended  . Essential hypertension   . Hyperlipidemia   . [facility]  Procedure Laterality Date  . APPENDECTOMY     8 yrs ago- Dr ZiegleGeroge BasemanTARACT EXTRACTION W/PHACO Left 07/14/2014   Procedure: CATARACT EXTRACTION PHACO AND INTRAOCULAR LENS PLACEMENT (IOC);  Surgeon: Kerry Tonny  Location: [facility];  Service: Ophthalmology;  Laterality: Left;  CDE:6.20  . CATARACT EXTRACTION W/PHACO Right 08/11/2014   Procedure: CATARACT EXTRACTION PHACO  AND INTRAOCULAR LENS PLACEMENT RIGHT EYE;  Surgeon: Tonny , MD;  Location: [facility];  Service: Ophthalmology;  Laterality: Right;  CDE:10.11  . COLONOSCOPY N/A 01/14/2015   Procedure: COLONOSCOPY;  Surgeon: Rogene Houston, MD;  Location: [facility] SUITE;  Service: Endoscopy;  Laterality: N/A;  730  . CORONARY ARTERY BYPASS GRAFT  01/1999   LIMA to LAD, SVG to diagonal, sequential SVG to PDA and PLB  .  PROSTATECTOMY  2004   [facility]  . TONSILLECTOMY     age 29     Home Medications:  Prior to Admission medications   Medication Sig Start Date End Date Taking? Authorizing Provider  acetaminophen (TYLENOL) 325 MG tablet Take 2 tablets (650 mg total) by mouth every 6 (six) hours as needed for mild pain, fever or headache (or Fever >/= 101). 02/06/20  Yes Emokpae, Courage, MD  apixaban (ELIQUIS) 5 MG TABS tablet Take 1 tablet (5 mg total) by mouth 2 (two) times daily. 03/31/20 03/26/21 Yes Strader, Fransisco Hertz, PA-C  b complex vitamins capsule Take 1 capsule by mouth daily.   Yes [provider]  Calcium-Magnesium-Vitamin D (CALCIUM 1200+D3 PO) Take 1 tablet by mouth daily.    Yes [provider]  cholecalciferol (VITAMIN D3) 25 MCG (1000 UNIT) tablet Take 10,000 Units by mouth daily.    Yes [provider]  fluticasone (FLONASE) 50 MCG/ACT nasal spray Place 1 spray into both nostrils daily as needed for allergies or rhinitis.   Yes [provider]  metoprolol tartrate (LOPRESSOR) 25 MG tablet Take 75 mg by mouth 2 (two) times daily.  04/27/20  Yes [provider]  Multiple Vitamin (MULTIVITAMIN WITH MINERALS) TABS Take 1 tablet by mouth daily.   Yes [provider]  predniSONE (DELTASONE) 5 MG tablet Take 5 mg by mouth 2 (two) times daily with a meal. Take with cancer medication.   Yes [provider]  rosuvastatin (CRESTOR) 40 MG tablet Take 1 tablet (40 mg total) by mouth daily after supper. 11/29/19 06/05/20 Yes Satira Sark, MD  triamcinolone cream (KENALOG) 0.1 % Apply topically. 05/01/20  Yes [provider]  ZYTIGA 250 MG tablet Take 4 tablets by mouth every morning.  09/20/16  Yes [provider]  nitroGLYCERIN (NITROSTAT) 0.4 MG SL tablet Place 0.4 mg under the tongue every 5 (five) minutes as needed for chest pain.    [provider]    Inpatient Medications: Scheduled Meds: . chlorhexidine  gluconate (MEDLINE KIT)  15 mL Mouth Rinse BID  . Chlorhexidine Gluconate Cloth  6 each Topical Daily  . heparin  5,000 Units Subcutaneous Q8H  . hydrocortisone sod succinate (SOLU-CORTEF) inj  50 mg Intravenous Q6H  . mouth rinse  15 mL Mouth Rinse 10 times per day  . pantoprazole (PROTONIX) IV  40 mg Intravenous Q24H  . vancomycin  500 mg Per Tube Q6H  . vancomycin (VANCOCIN) rectal ENEMA  500 mg Rectal Q6H   Continuous Infusions: . sodium chloride    . amiodarone 60 mg/hr (06/05/20 1100)  . fentaNYL infusion INTRAVENOUS 125 mcg/hr (06/05/20 0552)  . metronidazole 100 mL/hr at 06/05/20 1100  . norepinephrine (LEVOPHED) Adult infusion 40 mcg/min (06/05/20 1001)  . phenylephrine (NEO-SYNEPHRINE) Adult infusion 400 mcg/min (06/05/20 1134)  . piperacillin-tazobactam (ZOSYN)  IV Stopped (06/05/20 0919)  . vasopressin 0.03 Units/min (06/05/20 1100)   PRN Meds: midazolam  Allergies:    Allergies  Allergen Reactions  . Latex Rash  . Sulfa Antibiotics Rash  Social History:   Social History   Socioeconomic History  . Marital status: Married    Spouse name: Not on file  . Number of children: Not on file  . Years of education: Not on file  . Highest education level: Not on file  Occupational History  . Not on file  Tobacco Use  . Smoking status: Never Smoker  . Smokeless tobacco: Never Used  Vaping Use  . Vaping Use: Never used  Substance and Sexual Activity  . Alcohol use: Yes    Alcohol/week: 0.0 standard drinks    Comment: daily-glass of wine  . Drug use: No  . Sexual activity: Not on file  Other Topics Concern  . Not on file  Social History Narrative  . Not on file   Social Determinants of Health   Financial Resource Strain:   . Difficulty of Paying Living Expenses: Not on file  Food Insecurity:   . Worried About Charity fundraiser in the Last Year: Not on file  . Ran Out of Food in the Last Year: Not on file  Transportation Needs:   . Lack of  Transportation (Medical): Not on file  . Lack of Transportation (Non-Medical): Not on file  Physical Activity:   . Days of Exercise per Week: Not on file  . Minutes of Exercise per Session: Not on file  Stress:   . Feeling of Stress : Not on file  Social Connections:   . Frequency of Communication with Friends and Family: Not on file  . Frequency of Social Gatherings with Friends and Family: Not on file  . Attends Religious Services: Not on file  . Active Member of Clubs or Organizations: Not on file  . Attends Archivist Meetings: Not on file  . Marital Status: Not on file  Intimate Partner Violence:   . Fear of Current or Ex-Partner: Not on file  . Emotionally Abused: Not on file  . Physically Abused: Not on file  . Sexually Abused: Not on file     Family History:    Family History  Problem Relation Age of Onset  . Heart attack Mother   . Cancer Mother   . Aneurysm Father       Review of Systems    Unable to be obtained. Patient intubated and sedated.   Physical Exam/Data    Vitals:   06/05/20 1115 06/05/20 1130 06/05/20 1145 06/05/20 1200  BP:      Pulse: (!) 121 (!) 120 (!) 121 (!) 121  Resp:      Temp:      TempSrc:      SpO2: 92% 92% 93% 93%  Weight:      Height:        Intake/Output Summary (Last 24 hours) at 06/05/2020 1209 Last data filed at 06/05/2020 1100 Gross per 24 hour  Intake 7240.57 ml  Output --  Net 7240.57 ml   Filed Weights   06/05/20 0349  Weight: 76.8 kg   Body mass index is 27.33 kg/m.   General: Ill-appearing male.  Psych: Unable to be assessed.  Neuro: Intubated and sedated.  HEENT: Intubated.  Neck: Supple without bruits or JVD. Lungs:  Resp regular and unlabored, rales along bases and scattered rhonchi.  Heart: Regular rhythm, tachycardiac rate. no s3, s4, or murmurs. Abdomen: Soft, non-tender, appears distended.  Extremities: No clubbing or cyanosis. Trace lower extremity edema. DP/PT/Radials 2+ and equal  bilaterally.   EKG:  The EKG was personally reviewed  and demonstrates: Sinus tachycardia, HR 145.  Telemetry:  Telemetry was personally reviewed and demonstrates: Sinus tachycardia, HR 120's to 130's. Episode of VT labeled around 0347 but QRS complexes march out and most consistent with artifact.    Labs/Studies     Relevant CV Studies:  Echocardiogram: 12/2019 IMPRESSIONS    1. Left ventricular ejection fraction, by estimation, is 60 to 65%. The  left ventricle has normal function. The left ventricle has no regional  wall motion abnormalities. There is mild left ventricular hypertrophy.  Left ventricular diastolic parameters  are consistent with Grade I diastolic dysfunction (impaired relaxation).  2. Right ventricular systolic function is normal. The right ventricular  size is normal. There is mildly elevated pulmonary artery systolic  pressure.  3. The mitral valve is normal in structure. No evidence of mitral valve  regurgitation. No evidence of mitral stenosis.  4. The aortic valve is tricuspid. Aortic valve regurgitation is not  visualized. No aortic stenosis is present.  5. The inferior vena cava is normal in size with greater than 50%  respiratory variability, suggesting right atrial pressure of 3 mmHg.   Event Monitor: 01/2020 ZIO AT reviewed, 13 days 15 hours analyzed.  Predominant rhythm is sinus with heart rate ranging from 43 bpm up to 113 bpm and average heart rate 66 bpm.  There were rare PACs and PVCs representing less than 1% total beats.  Frequent episodes of PSVT versus atrial tachycardia were noted, the longest of which lasted for 4 minutes and 46 seconds.  There was also a brief episode of idioventricular rhythm at 62 bpm during early morning hours on July 23.  No pauses.  No definite atrial fibrillation.   Laboratory Data:  Chemistry Recent Labs  Lab 06/12/2020 2016 06/05/20 0234  NA 134* 137  K 3.5 3.2*  CL 99 107  CO2 23 18*  GLUCOSE 137* 131*   BUN 23 21  CREATININE 1.51* 1.79*  CALCIUM 8.9 7.5*  GFRNONAA 47* 38*  ANIONGAP 12 12    Recent Labs  Lab 06/30/2020 2016 06/05/20 0234  PROT 6.3* 4.7*  ALBUMIN 3.0* 2.2*  AST 17 57*  ALT 13 37  ALKPHOS 50 50  BILITOT 1.0 0.6   Hematology Recent Labs  Lab 06/25/2020 2016  WBC 13.1*  RBC 4.11*  HGB 13.2  HCT 40.9  MCV 99.5  MCH 32.1  MCHC 32.3  RDW 13.8  PLT 314   Cardiac EnzymesNo results for input(s): TROPONINI in the last 168 hours. No results for input(s): TROPIPOC in the last 168 hours.  BNPNo results for input(s): BNP, PROBNP in the last 168 hours.  DDimer No results for input(s): DDIMER in the last 168 hours.  Radiology/Studies:  CT ABDOMEN PELVIS W CONTRAST  Result Date: 06/13/2020 CLINICAL DATA:  Abdominal distension, diarrhea X 10 days. Told by GI that if it got worse to go to the ED. EXAM: CT ABDOMEN AND PELVIS WITH CONTRAST TECHNIQUE: Multidetector CT imaging of the abdomen and pelvis was performed using the standard protocol following bolus administration of intravenous contrast. CONTRAST:  47m OMNIPAQUE IOHEXOL 300 MG/ML  SOLN COMPARISON:  MRI abdomen 01/21/2020, CT abdomen pelvis 01/05/2020 FINDINGS: Lower chest: No acute abnormality. Four-vessel coronary artery calcifications status post coronary artery bypass. Hepatobiliary: No focal liver abnormality. Several calcified gallstones are noted within the gallbladder lumen. Otherwise no gallbladder wall thickening or pericholecystic fluid. No biliary dilatation. Pancreas: Redemonstration of a stable 1.5 x 1.2 pancreatic head/uncinate process lesion that was consistent with a in  intraductal mucinous papillary neoplasm on MRI abdomen 01/21/2020. No other pancreatic lesion identified. Normal pancreatic contour. No surrounding inflammatory changes. No main pancreatic ductal dilatation. Spleen: Normal in size. Associated calcified density likely represents sequelae of prior granulomatous disease. Suggestion of a splenule  is again noted (2:28). Adrenals/Urinary Tract: No adrenal nodule bilaterally. Nonspecific bilateral perinephric stranding. Bilateral kidneys enhance symmetrically. No hydronephrosis. No hydroureter. The urinary bladder is unremarkable. Stomach/Bowel: Stomach is within normal limits. No evidence of small bowel wall thickening or dilatation. Extensive large bowel wall circumferential thickening and pericolonic fat stranding that is much more prominent of the rectosigmoid colon. No large bowel dilatation. No pneumatosis. Appendix appears normal. Vascular/Lymphatic: No abdominal aorta or iliac aneurysm. Focal dilatation of the infrarenal abdominal aorta with a caliber of 2.6 cm (5:55). Atherosclerotic plaque of the aorta and its branches. No abdominal, pelvic, or inguinal lymphadenopathy. Reproductive: Prostate resection. Surgical clips associated with the right testicle likely represents vasectomy. Other: Trace fluid within the abdomen and pelvis. No intraperitoneal free gas. No organized fluid collection. Musculoskeletal: Small fat and fluid containing right inguinal hernia. Possible tiny fat containing supraumbilical hernia (9:37). Diffusely decreased bone density. Posterolateral fusion of the L4 through S1 levels with inter pedicular screw and rod fixation as well as interbody cages. Multilevel severe degenerative changes of the spine with intervertebral vacuum phenomenon. No suspicious lytic or blastic osseous lesions. No acute displaced fracture. Multilevel degenerative changes of the spine. IMPRESSION: 1. Marked pancolitis that is worse of the rectosigmoid colon. Etiology could be infectious, inflammatory, ischemic. No associated bowel obstruction or perforation. Correlate with stool cultures. 2. Other imaging findings of potential clinical significance: Cholelithiasis with no acute cholecystitis. 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 Small right inguinal hernia containing fat and  fluid. Aortic Atherosclerosis (ICD10-I70.0). Electronically Signed   By: Iven Finn M.D.   On: 06/26/2020 22:57   DG Chest Port 1 View  Result Date: 06/05/2020 CLINICAL DATA:  Post intubation EXAM: PORTABLE CHEST 1 VIEW COMPARISON:  06/30/2020 FINDINGS: Endotracheal tube is 4.6 cm above the carina. Patchy bilateral upper lobe airspace opacities. Heart is normal size. Prior CABG. No effusions or pneumothorax. No acute bony abnormality. IMPRESSION: Endotracheal tube 4.6 cm above the carina. Patchy bilateral upper lobe airspace opacities, new since prior study. Electronically Signed   By: Rolm Baptise M.D.   On: 06/05/2020 00:17   DG Chest Port 1 View  Result Date: 06/07/2020 CLINICAL DATA:  Code sepsis.  Fever, weakness, diarrhea EXAM: PORTABLE CHEST 1 VIEW COMPARISON:  Radiograph 02/04/2020.  CT 04/08/2016 FINDINGS: Post median sternotomy and CABG. Coronary stent also visualized. Heart is normal in size aortic atherosclerosis. Mild elevation of right hemidiaphragm. There are ill-defined streaky opacities at both lung bases. No pulmonary edema. No confluent consolidation. No pleural fluid or pneumothorax. The bones are under mineralized without acute osseous abnormalities. IMPRESSION: 1. Ill-defined streaky opacities at both lung bases, favor atelectasis over pneumonia. 2. Post CABG. Normal heart size. Aortic Atherosclerosis (ICD10-I70.0). Electronically Signed   By: Keith Rake M.D.   On: 06/30/2020 20:31   DG Chest Port 1V same Day  Result Date: 06/05/2020 CLINICAL DATA:  OG tube in central line placement EXAM: PORTABLE CHEST 1 VIEW COMPARISON:  CT abdomen pelvis 06/18/2020, radiograph 06/05/2020 FINDINGS: Endotracheal tube tip terminates in the mid trachea, 4 cm from the carina. A transesophageal tube tip is seen coiling at the level of the GE junction possibly folded within the distal thoracic esophagus or within the gastric fundus. Could  consider advancing at least 3 cm and reimaging. Right  IJ catheter tip in the mid SVC. Additional support devices including pacer pads and telemetry leads overlie the chest. Prior sternotomy and CABG with vascular stenting. Cardiac silhouette is similar to prior portable supine radiography accounting for differences in lung inflation. Calcified aorta is again noted. Diffuse interstitial and patchy airspace opacities appear to be increasing within the upper lungs with some atelectatic changes elsewhere. No pneumothorax or visible effusion. No acute osseous or soft tissue abnormality. IMPRESSION: 1. Endotracheal tube tip terminates in the mid trachea, 4 cm from the carina. 2. A transesophageal tube tip is seen coiling at the level of the GE junction possibly folded within the distal thoracic esophagus or within the gastric fundus. Could consider advancing at least 3 cm and reimaging. 3. Increasing upper lobe opacities with some likely atelectatic changes in the lung bases. 4.  Aortic Atherosclerosis (ICD10-I70.0). Electronically Signed   By: Lovena Le M.D.   On: 06/05/2020 01:37     Assessment & Plan    1. VF Arrest - He did suffer a ventricular fibrillation arrest in the setting of coming in with significant diarrhea and pancolitis. He was shocked twice and received Epinephrine twice along with Amiodarone.  At this time, he is on triple pressor support with Neo-Synephrine, Vasopressin and Levophed. In talking with the patient's nurse, she has been unable to wean pressor support thus far. He also remains on Amiodarone 60 mg/hr and heart rate has been in the 110's to 120's. - Critical Care is following the patient as well. Would anticipate obtaining a repeat echocardiogram today if able. He was agitated earlier with a bladder scan so imaging capability may be limited. Will also obtain a repeat EKG postarrest. At this time, his overall prognosis remains tenuous.   2. CAD/Elevated Troponin - He is s/p CABG in 2000 with NST in 10/2017 showing mild to moderate  peri-infarct ischemia with medical management recommended. - Initial HS Troponin 12 with repeat of 818. Update 12-Lead EKG. Will also obtain a repeat echo this admission.  - PTA BB and statin currently held.   3. Paroxysmal Atrial Flutter - Diagnosed in 12/2019 while admitted for sepsis. He was on Eliquis prior to admission which is currently held. Receiving DVT prophylaxis Heparin.  - On Amiodarone as outlined above.   4. Pancolitis/C.difficile - GI followng. Remains on Flagyl, Zosyn and Vancomycin.   For questions or updates, please contact [place] Please consult [email or web address] for contact info under Cardiology/STEMI.  Signed, Erma Heritage, PA-C 06/05/2020, 12:09 PM Pager: [phone]  Patient seen and discussed with PA Ahmed Prima, I agree with her documentation. 79 yo male history of CAD with CABG in 2000, Paroxysmal aflutter in setting of prior sepsis in 12/2019, HTN, HL, prostate cancer with bone mets admitted with diarrhea. In ER he was febrile, tachycardic and tachypneic but initially stable.    From ER notes 2343 patient had vfib arrest in ER. Epi x 2, amiodarone 135m then 3071m Shocked x 1.  Pulse palpated at 2349. Subsequently hypotensive, started on pressors. Tele reviewed, sinus tach with rare PVCs with sudden onset of vfib.   ER vitals: 139/84 p 105 95% RA Lactic acid 1.9 K 3.5 Cr 1.51 BUN 23 WBC 13.1 Plt 314 INR 1.3 Mg 1.7 COVID neg Cdiff + hstrop 12-->818 On vent 7.26/37.5/69/16.6 on 100% FiO2 Procalcitonin 2.79  CXR ill defined streaky opacities lung bases CT A/P marked pancolitis, pancreatic neoplasm  EKG 06/16/2020 2045 sinus tach  145 (prearrest)    Vfib arrest in setting of severe pancolitis and cdiff. Presented with diarrhea initially, no cardiopulmonary symptoms. Suspect severe systemic demand from severe abdominal infection in setting of chronically ischemic heart led to the arrhythmia. Cannot exclude acute obstructive disease, follow trop  trend and echo. Does not appear there were post arrest EKGs. Would not be a cath candidate. If survives and recovers from this event would need an ischemic evaluation  In shock post arrest, he is on levophed at 40, neo at 400, vaso 0.03 and high dose hydrocortisone. Will add a coox onto labs, follow up LVEF by echo. Can continue amio over the weekend.    Carlyle Dolly MD

## 2020-06-05 NOTE — ED Notes (Signed)
Patient showing signs of increased restlessness and attempting to grab ET tube.  Soft limb restraints applied.  Fentanyl IV titrated up per order.

## 2020-06-05 NOTE — ED Notes (Signed)
Patient's heart rate consistently >120.  MD Dr. Betsey Holiday aware.

## 2020-06-05 NOTE — ED Notes (Signed)
NG tube advanced per recommendation of Dr. Betsey Holiday.  Now able to verify placement via auscultation and positive return of gastric contents.  NT tube to LIWS.

## 2020-06-05 NOTE — Consult Note (Signed)
Urology Consult  Referring physician: Dr. Carles Collet Reason for referral: difficult foley placement  Chief Complaint: difficult foley placement  History of Present Illness: Samuel Gregory is a 79yo who was admitted with C diff colitis and Vfib arrest. He is currently in the ICU, intubated and sedated. He has a hx of radical prostatectomy and is currently on lupron for metastatic prostate cancer with bone mets. Multiple attempts were made to place a foley in the ER and the ICU without success.   Past Medical History:  Diagnosis Date  . Arthritis   . Atrial flutter ([place])    a. diagnosed in 12/2019 while admitted for sepsis  . Collagen vascular disease ([place])   . Coronary atherosclerosis of native coronary artery    a. s/p CABG in 2000 b. NST in 10/2017 showing mild to moderate per-infarct ischemia with medical management recommended  . Essential hypertension   . Hyperlipidemia   . Prostate cancer [facility])    Past Surgical History:  Procedure Laterality Date  . APPENDECTOMY     8 yrs ago- Dr Geroge Baseman  . CATARACT EXTRACTION W/PHACO Left 07/14/2014   Procedure: CATARACT EXTRACTION PHACO AND INTRAOCULAR LENS PLACEMENT (IOC);  Surgeon: Tonny Branch, MD;  Location: [facility] ORS;  Service: Ophthalmology;  Laterality: Left;  CDE:6.20  . CATARACT EXTRACTION W/PHACO Right 08/11/2014   Procedure: CATARACT EXTRACTION PHACO AND INTRAOCULAR LENS PLACEMENT RIGHT EYE;  Surgeon: Tonny Branch, MD;  Location: [facility] ORS;  Service: Ophthalmology;  Laterality: Right;  CDE:10.11  . COLONOSCOPY N/A 01/14/2015   Procedure: COLONOSCOPY;  Surgeon: Rogene Houston, MD;  Location: [facility] ENDO SUITE;  Service: Endoscopy;  Laterality: N/A;  730  . CORONARY ARTERY BYPASS GRAFT  01/1999   LIMA to LAD, SVG to diagonal, sequential SVG to PDA and PLB  . PROSTATECTOMY  2004   [name]  . TONSILLECTOMY     age 64    Medications: I have reviewed the patient's current medications. Allergies:  Allergies  Allergen Reactions  . Latex Rash  . Sulfa  Antibiotics Rash    Family History  Problem Relation Age of Onset  . Heart attack Mother   . Cancer Mother   . Aneurysm Father    Social History:  reports that he has never smoked. He has never used smokeless tobacco. He reports current alcohol use. He reports that he does not use drugs.  Review of Systems  Unable to perform ROS: Intubated    Physical Exam:  Vital signs in last 24 hours: Temp:  [98.5 F (36.9 C)-100.3 F (37.9 C)] 99.1 F (37.3 C) (11/05 0332) Pulse Rate:  [28-144] 121 (11/05 1100) Resp:  [14-46] 26 (11/05 0506) BP: (52-166)/(28-120) 135/77 (11/05 0845) SpO2:  [49 %-100 %] 91 % (11/05 1100) Arterial Line BP: (68-145)/(50-84) 93/58 (11/05 1100) FiO2 (%):  [100 %] 100 % (11/05 0835) Weight:  [73.8 kg-76.8 kg] 76.8 kg (11/05 0349) Physical Exam Vitals and nursing note reviewed.  Constitutional:      Appearance: Normal appearance.  HENT:     Head: Normocephalic and atraumatic.     Nose: Nose normal.     Mouth/Throat:     Mouth: Mucous membranes are dry.  Eyes:     Conjunctiva/sclera: Conjunctivae normal.  Abdominal:     General: There is distension.  Genitourinary:    Penis: Normal.      Testes: Normal.  Musculoskeletal:        General: Swelling present.     Cervical back: Neck supple.  Skin:  General: Skin is warm and dry.  Neurological:     Mental Status: He is alert.     Laboratory Data:  Results for orders placed or performed during the hospital encounter of 06/27/2020 (from the past 72 hour(s))  Respiratory Panel by RT PCR (Flu A&B, Covid) - Nasopharyngeal Swab     Status: None   Collection Time: 06/15/2020  7:37 PM   Specimen: Nasopharyngeal Swab  Result Value Ref Range   SARS Coronavirus 2 by RT PCR NEGATIVE NEGATIVE    Comment: (NOTE) SARS-CoV-2 target nucleic acids are NOT DETECTED.  The SARS-CoV-2 RNA is generally detectable in upper respiratoy specimens during the acute phase of infection. The lowest concentration of SARS-CoV-2  viral copies this assay can detect is 131 copies/mL. A negative result does not preclude SARS-Cov-2 infection and should not be used as the sole basis for treatment or other patient management decisions. A negative result may occur with  improper specimen collection/handling, submission of specimen other than nasopharyngeal swab, presence of viral mutation(s) within the areas targeted by this assay, and inadequate number of viral copies (<131 copies/mL). A negative result must be combined with clinical observations, patient history, and epidemiological information. The expected result is Negative.  Fact Sheet for Patients:  PinkCheek.be  Fact Sheet for Healthcare Providers:  GravelBags.it  This test is no t yet approved or cleared by the [place] FDA and  has been authorized for detection and/or diagnosis of SARS-CoV-2 by FDA under an Emergency Use Authorization (EUA). This EUA will remain  in effect (meaning this test can be used) for the duration of the COVID-19 declaration under Section 564(b)(1) of the Act, 21 U.S.C. section 360bbb-3(b)(1), unless the authorization is terminated or revoked sooner.     Influenza A by PCR NEGATIVE NEGATIVE   Influenza B by PCR NEGATIVE NEGATIVE    Comment: (NOTE) The Xpert Xpress SARS-CoV-2/FLU/RSV assay is intended as an aid in  the diagnosis of influenza from Nasopharyngeal swab specimens and  should not be used as a sole basis for treatment. Nasal washings and  aspirates are unacceptable for Xpert Xpress SARS-CoV-2/FLU/RSV  testing.  Fact Sheet for Patients: PinkCheek.be  Fact Sheet for Healthcare Providers: GravelBags.it  This test is not yet approved or cleared by the [place] FDA and  has been authorized for detection and/or diagnosis of SARS-CoV-2 by  FDA under an Emergency Use Authorization (EUA). This EUA  will remain  in effect (meaning this test can be used) for the duration of the  Covid-19 declaration under Section 564(b)(1) of the Act, 21  U.S.C. section 360bbb-3(b)(1), unless the authorization is  terminated or revoked. Performed at [facility], [address] [place], [place] [address]   C Difficile Quick Screen w PCR reflex     Status: Abnormal   Collection Time: 06/22/2020  7:39 PM   Specimen: STOOL  Result Value Ref Range   C Diff antigen POSITIVE (A) NEGATIVE   C Diff toxin POSITIVE (A) NEGATIVE    Comment: CRITICAL RESULT CALLED TO, READ BACK BY AND VERIFIED WITH: A POWELL,RN@2340  06/28/2020 MKELLY    C Diff interpretation Toxin producing C. difficile detected.     Comment: Performed at [facility], [address], [place], [place] [address]  Blood Culture (routine x 2)     Status: None (Preliminary result)   Collection Time: 06/15/2020  8:14 PM   Specimen: BLOOD LEFT HAND  Result Value Ref Range   Specimen Description BLOOD LEFT HAND  Special Requests      BOTTLES DRAWN AEROBIC AND ANAEROBIC Blood Culture adequate volume   Culture      NO GROWTH < 12 HOURS Performed at [facility], [address] [place], [place] [address]    Report Status PENDING   Lactic acid, plasma     Status: None   Collection Time: 06/19/2020  8:16 PM  Result Value Ref Range   Lactic Acid, Venous 1.9 0.5 - 1.9 mmol/L    Comment: Performed at [facility], [address] [place], [place] [address]  Comprehensive metabolic panel     Status: Abnormal   Collection Time: 06/13/2020  8:16 PM  Result Value Ref Range   Sodium 134 (L) 135 - 145 mmol/L   Potassium 3.5 3.5 - 5.1 mmol/L   Chloride 99 98 - 111 mmol/L   CO2 23 22 - 32 mmol/L   Glucose, Bld 137 (H) 70 - 99 mg/dL    Comment: Glucose reference range applies only to samples taken after fasting for at least 8 hours.   BUN 23 8 - 23 mg/dL   Creatinine, Ser 1.51 (H) 0.61 - 1.24 mg/dL   Calcium 8.9 8.9 - 10.3 mg/dL   Total Protein 6.3 (L) 6.5 -  8.1 g/dL   Albumin 3.0 (L) 3.5 - 5.0 g/dL   AST 17 15 - 41 U/L   ALT 13 0 - 44 U/L   Alkaline Phosphatase 50 38 - 126 U/L   Total Bilirubin 1.0 0.3 - 1.2 mg/dL   GFR, Estimated 47 (L) >60 mL/min    Comment: (NOTE) Calculated using the CKD-EPI Creatinine Equation (2021)    Anion gap 12 5 - 15    Comment: Performed at [facility], [address], [place], [place] [address]  CBC WITH DIFFERENTIAL     Status: Abnormal   Collection Time: 06/11/2020  8:16 PM  Result Value Ref Range   WBC 13.1 (H) 4.0 - 10.5 K/uL   RBC 4.11 (L) 4.22 - 5.81 MIL/uL   Hemoglobin 13.2 13.0 - 17.0 g/dL   HCT 40.9 39 - 52 %   MCV 99.5 80.0 - 100.0 fL   MCH 32.1 26.0 - 34.0 pg   MCHC 32.3 30.0 - 36.0 g/dL   RDW 13.8 11.5 - 15.5 %   Platelets 314 150 - 400 K/uL   nRBC 0.0 0.0 - 0.2 %   Neutrophils Relative % 71 %   Neutro Abs 9.3 (H) 1.7 - 7.7 K/uL   Lymphocytes Relative 20 %   Lymphs Abs 2.7 0.7 - 4.0 K/uL   Monocytes Relative 7 %   Monocytes Absolute 0.9 0.1 - 1.0 K/uL   Eosinophils Relative 0 %   Eosinophils Absolute 0.0 0.0 - 0.5 K/uL   Basophils Relative 1 %   Basophils Absolute 0.1 0.0 - 0.1 K/uL   WBC Morphology INCREASED BANDS (>20% BANDS)    Immature Granulocytes 1 %   Abs Immature Granulocytes 0.12 (H) 0.00 - 0.07 K/uL    Comment: Performed at [facility], [address] [place], [place] [address]  Protime-INR     Status: Abnormal   Collection Time: 06/21/2020  8:16 PM  Result Value Ref Range   Prothrombin Time 15.3 (H) 11.4 - 15.2 seconds   INR 1.3 (H) 0.8 - 1.2    Comment: (NOTE) INR goal varies based on device and disease states. Performed at [facility], [address], [place], [place] [address]   APTT     Status: None  Collection Time: 06/20/2020  8:16 PM  Result Value Ref Range   aPTT 33 24 - 36 seconds    Comment: Performed at [facility], [address], [place], [place] [address]  Blood Culture (routine x 2)     Status: None (Preliminary result)   Collection Time: 06/07/2020  8:16  PM   Specimen: BLOOD RIGHT HAND  Result Value Ref Range   Specimen Description BLOOD RIGHT HAND    Special Requests      BOTTLES DRAWN AEROBIC AND ANAEROBIC Blood Culture adequate volume   Culture      NO GROWTH < 12 HOURS Performed at [facility], [address], [place], [place] [address]    Report Status PENDING   Troponin I (High Sensitivity)     Status: None   Collection Time: 06/03/2020  8:16 PM  Result Value Ref Range   Troponin I (High Sensitivity) 12 <18 ng/L    Comment: (NOTE) Elevated high sensitivity troponin I (hsTnI) values and significant  changes across serial measurements may suggest ACS but many other  chronic and acute conditions are known to elevate hsTnI results.  Refer to the "Links" section for chest pain algorithms and additional  guidance. Performed at [facility], [address] [place], [place] [address]   Blood gas, arterial     Status: Abnormal   Collection Time: 06/05/20  1:30 AM  Result Value Ref Range   FIO2 100.00    Delivery systems VENTILATOR    Mode PRESSURE REGULATED VOLUME CONTROL    VT 500.0 mL   LHR 20 resp/min   Peep/cpap 5.0 cm H20   pH, Arterial 7.259 (L) 7.35 - 7.45   pCO2 arterial 37.5 32 - 48 mmHg   pO2, Arterial 69.0 (L) 83 - 108 mmHg   Bicarbonate 16.6 (L) 20.0 - 28.0 mmol/L   Acid-base deficit 9.5 (H) 0.0 - 2.0 mmol/L   O2 Saturation 88.9 %   Patient temperature 37.0    Collection site RIGHT RADIAL    Drawn by [id number]    Allens test (pass/fail) PASS PASS    Comment: Performed at [facility], [address], [place], [place] [address]  Troponin I (High Sensitivity)     Status: Abnormal   Collection Time: 06/05/20  2:34 AM  Result Value Ref Range   Troponin I (High Sensitivity) 818 (HH) <18 ng/L    Comment: CRITICAL RESULT CALLED TO, READ BACK BY AND VERIFIED WITH: J PICKETT,RN@0345  06/05/20 MKELLY (NOTE) Elevated high sensitivity troponin I (hsTnI) values and significant  changes across serial measurements may suggest ACS  but many other  chronic and acute conditions are known to elevate hsTnI results.  Refer to the Links section for chest pain algorithms and additional  guidance. Performed at [facility], [address], [place], [place] [address]   Comprehensive metabolic panel     Status: Abnormal   Collection Time: 06/05/20  2:34 AM  Result Value Ref Range   Sodium 137 135 - 145 mmol/L   Potassium 3.2 (L) 3.5 - 5.1 mmol/L   Chloride 107 98 - 111 mmol/L   CO2 18 (L) 22 - 32 mmol/L   Glucose, Bld 131 (H) 70 - 99 mg/dL    Comment: Glucose reference range applies only to samples taken after fasting for at least 8 hours.   BUN 21 8 - 23 mg/dL   Creatinine, Ser 1.79 (H) 0.61 - 1.24 mg/dL   Calcium 7.5 (L) 8.9 - 10.3 mg/dL   Total Protein  4.7 (L) 6.5 - 8.1 g/dL   Albumin 2.2 (L) 3.5 - 5.0 g/dL   AST 57 (H) 15 - 41 U/L   ALT 37 0 - 44 U/L   Alkaline Phosphatase 50 38 - 126 U/L   Total Bilirubin 0.6 0.3 - 1.2 mg/dL   GFR, Estimated 38 (L) >60 mL/min    Comment: (NOTE) Calculated using the CKD-EPI Creatinine Equation (2021)    Anion gap 12 5 - 15    Comment: Performed at [facility], [address], [place], [place] [address]  Protime-INR     Status: Abnormal   Collection Time: 06/05/20  2:34 AM  Result Value Ref Range   Prothrombin Time 18.1 (H) 11.4 - 15.2 seconds   INR 1.6 (H) 0.8 - 1.2    Comment: (NOTE) INR goal varies based on device and disease states. Performed at [facility], [address] [place], [place] [address]   APTT     Status: Abnormal   Collection Time: 06/05/20  2:34 AM  Result Value Ref Range   aPTT 41 (H) 24 - 36 seconds    Comment:        IF BASELINE aPTT IS ELEVATED, SUGGEST PATIENT RISK ASSESSMENT BE USED TO DETERMINE APPROPRIATE ANTICOAGULANT THERAPY. Performed at [facility], [address], [place], [place] [address]   Magnesium     Status: None   Collection Time: 06/05/20  2:34 AM  Result Value Ref Range   Magnesium 1.7 1.7 - 2.4 mg/dL    Comment: Performed  at [facility], [address], [place], [place] [address]  Phosphorus     Status: None   Collection Time: 06/05/20  2:34 AM  Result Value Ref Range   Phosphorus 3.6 2.5 - 4.6 mg/dL    Comment: Performed at [facility], [address] [place],  [address]  Procalcitonin - Baseline     Status: None   Collection Time: 06/05/20  2:34 AM  Result Value Ref Range   Procalcitonin 2.79 ng/mL    Comment:        Interpretation: PCT > 2 ng/mL: Systemic infection (sepsis) is likely, unless other causes are known. (NOTE)       Sepsis PCT Algorithm           Lower Respiratory Tract                                      Infection PCT Algorithm    ----------------------------     ----------------------------         PCT < 0.25 ng/mL                PCT < 0.10 ng/mL          Strongly encourage             Strongly discourage   discontinuation of antibiotics    initiation of antibiotics    ----------------------------     -----------------------------       PCT 0.25 - 0.50 ng/mL            PCT 0.10 - 0.25 ng/mL               OR       >80% decrease in PCT            Discourage initiation of  antibiotics      Encourage discontinuation           of antibiotics    ----------------------------     -----------------------------         PCT >= 0.50 ng/mL              PCT 0.26 - 0.50 ng/mL               AND       <80% decrease in PCT              Encourage initiation of                                             antibiotics       Encourage continuation           of antibiotics    ----------------------------     -----------------------------        PCT >= 0.50 ng/mL                  PCT > 0.50 ng/mL               AND         increase in PCT                  Strongly encourage                                      initiation of antibiotics    Strongly encourage escalation           of antibiotics                                      -----------------------------                                           PCT <= 0.25 ng/mL                                                 OR                                        > 80% decrease in PCT                                      Discontinue / Do not initiate                                             antibiotics  Performed at [facility], [address] [place], [place] [address]   MRSA PCR Screening     Status: None   Collection Time:  06/05/20  3:15 AM   Specimen: Nasal Mucosa; Nasopharyngeal  Result Value Ref Range   MRSA by PCR NEGATIVE NEGATIVE    Comment:        The GeneXpert MRSA Assay (FDA approved for NASAL specimens only), is one component of a comprehensive MRSA colonization surveillance program. It is not intended to diagnose MRSA infection nor to guide or monitor treatment for MRSA infections. Performed at [facility], [address], [place], [place] [address]   Blood gas, arterial     Status: Abnormal   Collection Time: 06/05/20  4:22 AM  Result Value Ref Range   FIO2 100.00    Delivery systems VENTILATOR    Mode PRESSURE REGULATED VOLUME CONTROL    VT 510 mL   LHR 18 resp/min   Peep/cpap 5.0 cm H20   pH, Arterial 7.363 7.35 - 7.45   pCO2 arterial 43.2 32 - 48 mmHg   pO2, Arterial 64.6 (L) 83 - 108 mmHg   Bicarbonate 23.5 20.0 - 28.0 mmol/L   Acid-base deficit 0.7 0.0 - 2.0 mmol/L   O2 Saturation 90.3 %   Patient temperature 37.0    Collection site RIGHT RADIAL    Drawn by [id number]    Allens test (pass/fail) PASS PASS    Comment: Performed at [facility], [address] [place], [place] [address]  Glucose, capillary     Status: Abnormal   Collection Time: 06/05/20  7:50 AM  Result Value Ref Range   Glucose-Capillary 135 (H) 70 - 99 mg/dL    Comment: Glucose reference range applies only to samples taken after fasting for at least 8 hours.  Glucose, capillary     Status: Abnormal   Collection Time: 06/05/20 11:38 AM  Result Value Ref Range    Glucose-Capillary 199 (H) 70 - 99 mg/dL    Comment: Glucose reference range applies only to samples taken after fasting for at least 8 hours.   Recent Results (from the past 240 hour(s))  Respiratory Panel by RT PCR (Flu A&B, Covid) - Nasopharyngeal Swab     Status: None   Collection Time: 06/19/2020  7:37 PM   Specimen: Nasopharyngeal Swab  Result Value Ref Range Status   SARS Coronavirus 2 by RT PCR NEGATIVE NEGATIVE Final    Comment: (NOTE) SARS-CoV-2 target nucleic acids are NOT DETECTED.  The SARS-CoV-2 RNA is generally detectable in upper respiratoy specimens during the acute phase of infection. The lowest concentration of SARS-CoV-2 viral copies this assay can detect is 131 copies/mL. A negative result does not preclude SARS-Cov-2 infection and should not be used as the sole basis for treatment or other patient management decisions. A negative result may occur with  improper specimen collection/handling, submission of specimen other than nasopharyngeal swab, presence of viral mutation(s) within the areas targeted by this assay, and inadequate number of viral copies (<131 copies/mL). A negative result must be combined with clinical observations, patient history, and epidemiological information. The expected result is Negative.  Fact Sheet for Patients:  PinkCheek.be  Fact Sheet for Healthcare Providers:  GravelBags.it  This test is no t yet approved or cleared by the [place] FDA and  has been authorized for detection and/or diagnosis of SARS-CoV-2 by FDA under an Emergency Use Authorization (EUA). This EUA will remain  in effect (meaning this test can be used) for the duration of the COVID-19 declaration under Section 564(b)(1) of the Act, 21 U.S.C. section 360bbb-3(b)(1), unless the authorization is terminated or revoked sooner.  Influenza A by PCR NEGATIVE NEGATIVE Final   Influenza B by PCR NEGATIVE  NEGATIVE Final    Comment: (NOTE) The Xpert Xpress SARS-CoV-2/FLU/RSV assay is intended as an aid in  the diagnosis of influenza from Nasopharyngeal swab specimens and  should not be used as a sole basis for treatment. Nasal washings and  aspirates are unacceptable for Xpert Xpress SARS-CoV-2/FLU/RSV  testing.  Fact Sheet for Patients: PinkCheek.be  Fact Sheet for Healthcare Providers: GravelBags.it  This test is not yet approved or cleared by the [place] FDA and  has been authorized for detection and/or diagnosis of SARS-CoV-2 by  FDA under an Emergency Use Authorization (EUA). This EUA will remain  in effect (meaning this test can be used) for the duration of the  Covid-19 declaration under Section 564(b)(1) of the Act, 21  U.S.C. section 360bbb-3(b)(1), unless the authorization is  terminated or revoked. Performed at [facility], [address] [place], [place] [address]   C Difficile Quick Screen w PCR reflex     Status: Abnormal   Collection Time: 06/28/2020  7:39 PM   Specimen: STOOL  Result Value Ref Range Status   C Diff antigen POSITIVE (A) NEGATIVE Final   C Diff toxin POSITIVE (A) NEGATIVE Final    Comment: CRITICAL RESULT CALLED TO, READ BACK BY AND VERIFIED WITH: A POWELL,RN@2340  06/23/2020 MKELLY    C Diff interpretation Toxin producing C. difficile detected.  Final    Comment: Performed at [facility], [address] [place], [place] [address]  Blood Culture (routine x 2)     Status: None (Preliminary result)   Collection Time: 06/03/2020  8:14 PM   Specimen: BLOOD LEFT HAND  Result Value Ref Range Status   Specimen Description BLOOD LEFT HAND  Final   Special Requests   Final    BOTTLES DRAWN AEROBIC AND ANAEROBIC Blood Culture adequate volume   Culture   Final    NO GROWTH < 12 HOURS Performed at [facility], [address] [place], [place] [address]    Report Status PENDING  Incomplete  Blood  Culture (routine x 2)     Status: None (Preliminary result)   Collection Time: 06/27/2020  8:16 PM   Specimen: BLOOD RIGHT HAND  Result Value Ref Range Status   Specimen Description BLOOD RIGHT HAND  Final   Special Requests   Final    BOTTLES DRAWN AEROBIC AND ANAEROBIC Blood Culture adequate volume   Culture   Final    NO GROWTH < 12 HOURS Performed at [facility], [address] [place], [place] [address]    Report Status PENDING  Incomplete  MRSA PCR Screening     Status: None   Collection Time: 06/05/20  3:15 AM   Specimen: Nasal Mucosa; Nasopharyngeal  Result Value Ref Range Status   MRSA by PCR NEGATIVE NEGATIVE Final    Comment:        The GeneXpert MRSA Assay (FDA approved for NASAL specimens only), is one component of a comprehensive MRSA colonization surveillance program. It is not intended to diagnose MRSA infection nor to guide or monitor treatment for MRSA infections. Performed at [facility], [address], [place], [place] [address]    Creatinine: Recent Labs    06/21/2020 2016 06/05/20 0234  CREATININE 1.51* 1.79*   Baseline Creatinine: 1  Impression/Assessment:  79yo with hx of radical prostatectomy and difficult foley placement  Plan:  A 12 french foley was placed and 60cc of dark urine drained  Samuel Gregory 06/05/2020, 11:48 AM

## 2020-06-05 NOTE — ED Notes (Signed)
Patient remains restless.  Fentanyl titrated up to 100 mcg/hr.

## 2020-06-06 ENCOUNTER — Inpatient Hospital Stay (HOSPITAL_COMMUNITY): Payer: Medicare Other

## 2020-06-06 DIAGNOSIS — K529 Noninfective gastroenteritis and colitis, unspecified: Secondary | ICD-10-CM | POA: Diagnosis not present

## 2020-06-06 DIAGNOSIS — J9601 Acute respiratory failure with hypoxia: Secondary | ICD-10-CM | POA: Diagnosis not present

## 2020-06-06 DIAGNOSIS — Z452 Encounter for adjustment and management of vascular access device: Secondary | ICD-10-CM | POA: Diagnosis not present

## 2020-06-06 DIAGNOSIS — A0472 Enterocolitis due to Clostridium difficile, not specified as recurrent: Secondary | ICD-10-CM | POA: Diagnosis not present

## 2020-06-06 DIAGNOSIS — A419 Sepsis, unspecified organism: Secondary | ICD-10-CM | POA: Diagnosis not present

## 2020-06-06 LAB — COMPREHENSIVE METABOLIC PANEL
ALT: 25 U/L (ref 0–44)
AST: 48 U/L — ABNORMAL HIGH (ref 15–41)
Albumin: 1.1 g/dL — ABNORMAL LOW (ref 3.5–5.0)
Alkaline Phosphatase: 35 U/L — ABNORMAL LOW (ref 38–126)
Anion gap: 7 (ref 5–15)
BUN: 17 mg/dL (ref 8–23)
CO2: 12 mmol/L — ABNORMAL LOW (ref 22–32)
Calcium: 4 mg/dL — CL (ref 8.9–10.3)
Chloride: 116 mmol/L — ABNORMAL HIGH (ref 98–111)
Creatinine, Ser: 1.93 mg/dL — ABNORMAL HIGH (ref 0.61–1.24)
GFR, Estimated: 35 mL/min — ABNORMAL LOW (ref >60)
Glucose, Bld: 221 mg/dL — ABNORMAL HIGH (ref 70–99)
Potassium: 2.5 mmol/L — CL (ref 3.5–5.1)
Sodium: 135 mmol/L (ref 135–145)
Total Bilirubin: 0.3 mg/dL (ref 0.3–1.2)
Total Protein: <3 g/dL — ABNORMAL LOW (ref 6.5–8.1)

## 2020-06-06 LAB — GLUCOSE, CAPILLARY
Glucose-Capillary: 203 mg/dL — ABNORMAL HIGH (ref 70–99)
Glucose-Capillary: 209 mg/dL — ABNORMAL HIGH (ref 70–99)
Glucose-Capillary: 232 mg/dL — ABNORMAL HIGH (ref 70–99)
Glucose-Capillary: 248 mg/dL — ABNORMAL HIGH (ref 70–99)
Glucose-Capillary: 265 mg/dL — ABNORMAL HIGH (ref 70–99)
Glucose-Capillary: 267 mg/dL — ABNORMAL HIGH (ref 70–99)
Glucose-Capillary: 321 mg/dL — ABNORMAL HIGH (ref 70–99)

## 2020-06-06 LAB — MAGNESIUM
Magnesium: 1.1 mg/dL — ABNORMAL LOW (ref 1.7–2.4)
Magnesium: 2.8 mg/dL — ABNORMAL HIGH (ref 1.7–2.4)
Magnesium: 2.7 mg/dL — ABNORMAL HIGH (ref 1.7–2.4)

## 2020-06-06 LAB — BLOOD GAS, ARTERIAL
Acid-base deficit: 9.5 mmol/L — ABNORMAL HIGH (ref 0.0–2.0)
Bicarbonate: 15.7 mmol/L — ABNORMAL LOW (ref 20.0–28.0)
FIO2: 90
O2 Saturation: 94.6 %
Patient temperature: 37
pCO2 arterial: 53.1 mmHg — ABNORMAL HIGH (ref 32.0–48.0)
pH, Arterial: 7.152 — CL (ref 7.350–7.450)
pO2, Arterial: 79.4 mmHg — ABNORMAL LOW (ref 83.0–108.0)

## 2020-06-06 LAB — CBC
HCT: 26.6 % — ABNORMAL LOW (ref 39.0–52.0)
HCT: 31.7 % — ABNORMAL LOW (ref 39.0–52.0)
Hemoglobin: 8.5 g/dL — ABNORMAL LOW (ref 13.0–17.0)
Hemoglobin: 10.3 g/dL — ABNORMAL LOW (ref 13.0–17.0)
MCH: 32.7 pg (ref 26.0–34.0)
MCH: 31.5 pg (ref 26.0–34.0)
MCHC: 32 g/dL (ref 30.0–36.0)
MCHC: 32.5 g/dL (ref 30.0–36.0)
MCV: 102.3 fL — ABNORMAL HIGH (ref 80.0–100.0)
MCV: 96.9 fL (ref 80.0–100.0)
Platelets: 189 10*3/uL (ref 150–400)
Platelets: 223 10*3/uL (ref 150–400)
RBC: 2.6 MIL/uL — ABNORMAL LOW (ref 4.22–5.81)
RBC: 3.27 MIL/uL — ABNORMAL LOW (ref 4.22–5.81)
RDW: 14.6 % (ref 11.5–15.5)
RDW: 14.6 % (ref 11.5–15.5)
WBC: 13 10*3/uL — ABNORMAL HIGH (ref 4.0–10.5)
WBC: 16.1 10*3/uL — ABNORMAL HIGH (ref 4.0–10.5)
nRBC: 0 % (ref 0.0–0.2)
nRBC: 0.2 % (ref 0.0–0.2)

## 2020-06-06 LAB — BASIC METABOLIC PANEL
Anion gap: 9 (ref 5–15)
BUN: 26 mg/dL — ABNORMAL HIGH (ref 8–23)
CO2: 20 mmol/L — ABNORMAL LOW (ref 22–32)
Calcium: 6.1 mg/dL — CL (ref 8.9–10.3)
Chloride: 101 mmol/L (ref 98–111)
Creatinine, Ser: 3.3 mg/dL — ABNORMAL HIGH (ref 0.61–1.24)
GFR, Estimated: 18 mL/min — ABNORMAL LOW (ref >60)
Glucose, Bld: 261 mg/dL — ABNORMAL HIGH (ref 70–99)
Potassium: 4.6 mmol/L (ref 3.5–5.1)
Sodium: 130 mmol/L — ABNORMAL LOW (ref 135–145)

## 2020-06-06 LAB — HEMOGLOBIN A1C
Hgb A1c MFr Bld: 6.1 % — ABNORMAL HIGH (ref 4.8–5.6)
Mean Plasma Glucose: 128.37 mg/dL

## 2020-06-06 LAB — COOXEMETRY PANEL
Carboxyhemoglobin: 0.5 % (ref 0.5–1.5)
Methemoglobin: 0.9 % (ref 0.0–1.5)
O2 Saturation: 52.7 %
Total hemoglobin: 11.7 g/dL — ABNORMAL LOW (ref 12.0–16.0)

## 2020-06-06 LAB — PHOSPHORUS: Phosphorus: 2.9 mg/dL (ref 2.5–4.6)

## 2020-06-06 LAB — PROCALCITONIN: Procalcitonin: 27.69 ng/mL

## 2020-06-06 MED ORDER — MAGNESIUM SULFATE 4 GM/100ML IV SOLN
4.0000 g | Freq: Once | INTRAVENOUS | Status: AC
  Administered 2020-06-06: 4 g via INTRAVENOUS
  Filled 2020-06-06: qty 100

## 2020-06-06 MED ORDER — CALCIUM GLUCONATE-NACL 1-0.675 GM/50ML-% IV SOLN
1.0000 g | Freq: Once | INTRAVENOUS | Status: AC
  Administered 2020-06-06: 1000 mg via INTRAVENOUS
  Filled 2020-06-06: qty 50

## 2020-06-06 MED ORDER — FUROSEMIDE 10 MG/ML IJ SOLN
40.0000 mg | Freq: Once | INTRAMUSCULAR | Status: AC
  Administered 2020-06-06: 40 mg via INTRAVENOUS
  Filled 2020-06-06: qty 4

## 2020-06-06 MED ORDER — SODIUM BICARBONATE 8.4 % IV SOLN
INTRAVENOUS | Status: AC
  Filled 2020-06-06: qty 50

## 2020-06-06 MED ORDER — CALCIUM GLUCONATE-NACL 1-0.675 GM/50ML-% IV SOLN
1.0000 g | INTRAVENOUS | Status: AC
  Administered 2020-06-06 (×2): 1000 mg via INTRAVENOUS
  Filled 2020-06-06 (×2): qty 50

## 2020-06-06 MED ORDER — SODIUM CHLORIDE 0.9% FLUSH: 10.0000 mL | INTRAVENOUS | Status: DC | PRN

## 2020-06-06 MED ORDER — CALCIUM GLUCONATE-NACL 2-0.675 GM/100ML-% IV SOLN
2.0000 g | Freq: Once | INTRAVENOUS | Status: DC
  Filled 2020-06-06: qty 100

## 2020-06-06 MED ORDER — SODIUM BICARBONATE 8.4 % IV SOLN
100.0000 meq | Freq: Once | INTRAVENOUS | Status: AC
  Administered 2020-06-06: 100 meq via INTRAVENOUS

## 2020-06-06 MED ORDER — POTASSIUM CHLORIDE 10 MEQ/100ML IV SOLN
10.0000 meq | INTRAVENOUS | Status: AC
  Administered 2020-06-06 (×4): 10 meq via INTRAVENOUS
  Filled 2020-06-06 (×5): qty 100

## 2020-06-06 MED ORDER — ASPIRIN 300 MG RE SUPP
150.0000 mg | Freq: Every day | RECTAL | Status: DC
  Administered 2020-06-06: 150 mg via RECTAL

## 2020-06-06 MED ORDER — INSULIN ASPART 100 UNIT/ML ~~LOC~~ SOLN
0.0000 [IU] | SUBCUTANEOUS | Status: DC
  Administered 2020-06-06: 5 [IU] via SUBCUTANEOUS
  Administered 2020-06-06 – 2020-06-07 (×3): 3 [IU] via SUBCUTANEOUS
  Administered 2020-06-07: 2 [IU] via SUBCUTANEOUS
  Administered 2020-06-07: 3 [IU] via SUBCUTANEOUS
  Administered 2020-06-07: 2 [IU] via SUBCUTANEOUS

## 2020-06-06 MED ORDER — MAGNESIUM SULFATE 2 GM/50ML IV SOLN
2.0000 g | Freq: Once | INTRAVENOUS | Status: AC
  Administered 2020-06-06: 2 g via INTRAVENOUS
  Filled 2020-06-06: qty 50

## 2020-06-06 MED ORDER — POTASSIUM CHLORIDE 10 MEQ/50ML IV SOLN
10.0000 meq | INTRAVENOUS | Status: DC
  Filled 2020-06-06 (×4): qty 50

## 2020-06-06 MED ORDER — POTASSIUM CHLORIDE 20 MEQ/15ML (10%) PO SOLN
40.0000 meq | Freq: Once | ORAL | Status: AC
  Administered 2020-06-06: 40 meq
  Filled 2020-06-06: qty 30

## 2020-06-06 MED ORDER — VASOPRESSIN 20 UNIT/ML IV SOLN
INTRAVENOUS | Status: AC
  Filled 2020-06-06: qty 1

## 2020-06-06 MED ORDER — SODIUM CHLORIDE 0.9% FLUSH
10.0000 mL | Freq: Two times a day (BID) | INTRAVENOUS | Status: DC
  Administered 2020-06-06 – 2020-06-13 (×14): 10 mL
  Administered 2020-06-13: 20 mL
  Administered 2020-06-14: 10 mL

## 2020-06-06 MED ORDER — FUROSEMIDE 10 MG/ML IJ SOLN
20.0000 mg | Freq: Once | INTRAMUSCULAR | Status: AC
  Administered 2020-06-06: 20 mg via INTRAVENOUS
  Filled 2020-06-06: qty 2

## 2020-06-06 NOTE — Progress Notes (Signed)
Patient intubated and unresponsive Only scant [place] stool overnight.  Rectal vancomycin not administered yesterday due to hemodynamic instability Patient remains on vancomycin per NG and IV metronidazole Seen with nursing staff, Estill Bamberg  Vital signs in last 24 hours: Temp:  [97.6 F (36.4 C)-99.1 F (37.3 C)] 97.7 F (36.5 C) (11/06 1140) Pulse Rate:  [75-120] 107 (11/06 1145) BP: (80-148)/(31-98) 148/74 (11/06 1100) SpO2:  [70 %-100 %] 95 % (11/06 1145) Arterial Line BP: (55-133)/(46-96) 133/62 (11/06 1145) FiO2 (%):  [90 %-100 %] 90 % (11/06 0802) Last BM Date: 06/06/20 General: Intubated unresponsive. Abdomen: Somewhat distended.  Bowel sounds decreased. Extremities:  Without clubbing or edema.    Intake/Output from previous day: 11/05 0701 - 11/06 0700 In: 10267.3 [I.V.:9150.7; IV Piggyback:1116.7] Out: 250 [Urine:250] Intake/Output this shift: Total I/O In: 2508.6 [I.V.:1984.4; IV Piggyback:524.1] Out: -   Lab Results: Recent Labs    06/21/2020 2016  WBC 13.1*  HGB 13.2  HCT 40.9  PLT 314   BMET Recent Labs    06/05/20 0234 06/05/20 1300 06/06/20 0407  NA 137 134* 135  K 3.2* 4.2 2.5*  CL 107 111 116*  CO2 18* 15* 12*  GLUCOSE 131* 232* 221*  BUN 21 21 17   CREATININE 1.79* 2.46* 1.93*  CALCIUM 7.5* 6.2* 4.0*   LFT Recent Labs    06/06/20 0407  PROT <3.0*  ALBUMIN 1.1*  AST 48*  ALT 25  ALKPHOS 35*  BILITOT 0.3   PT/INR Recent Labs    06/03/2020 2016 06/05/20 0234  LABPROT 15.3* 18.1*  INR 1.3* 1.6*   Hepatitis Panel No results for input(s): HEPBSAG, HCVAB, HEPAIGM, HEPBIGM in the last 72 hours. C-Diff Recent Labs    06/26/2020 1939  CDIFFTOX POSITIVE*    Studies/Results: CT ABDOMEN PELVIS W CONTRAST  Result Date: 06/22/2020 CLINICAL DATA:  Abdominal distension, diarrhea X 10 days. Told by GI that if it got worse to go to the ED. EXAM: CT ABDOMEN AND PELVIS WITH CONTRAST TECHNIQUE: Multidetector CT imaging of the abdomen and pelvis was  performed using the standard protocol following bolus administration of intravenous contrast. CONTRAST:  76mL OMNIPAQUE IOHEXOL 300 MG/ML  SOLN COMPARISON:  MRI abdomen 01/21/2020, CT abdomen pelvis 01/05/2020 FINDINGS: Lower chest: No acute abnormality. Four-vessel coronary artery calcifications status post coronary artery bypass. Hepatobiliary: No focal liver abnormality. Several calcified gallstones are noted within the gallbladder lumen. Otherwise no gallbladder wall thickening or pericholecystic fluid. No biliary dilatation. Pancreas: Redemonstration of a stable 1.5 x 1.2 pancreatic head/uncinate process lesion that was consistent with a in intraductal mucinous papillary neoplasm on MRI abdomen 01/21/2020. No other pancreatic lesion identified. Normal pancreatic contour. No surrounding inflammatory changes. No main pancreatic ductal dilatation. Spleen: Normal in size. Associated calcified density likely represents sequelae of prior granulomatous disease. Suggestion of a splenule is again noted (2:28). Adrenals/Urinary Tract: No adrenal nodule bilaterally. Nonspecific bilateral perinephric stranding. Bilateral kidneys enhance symmetrically. No hydronephrosis. No hydroureter. The urinary bladder is unremarkable. Stomach/Bowel: Stomach is within normal limits. No evidence of small bowel wall thickening or dilatation. Extensive large bowel wall circumferential thickening and pericolonic fat stranding that is much more prominent of the rectosigmoid colon. No large bowel dilatation. No pneumatosis. Appendix appears normal. Vascular/Lymphatic: No abdominal aorta or iliac aneurysm. Focal dilatation of the infrarenal abdominal aorta with a caliber of 2.6 cm (5:55). Atherosclerotic plaque of the aorta and its branches. No abdominal, pelvic, or inguinal lymphadenopathy. Reproductive: Prostate resection. Surgical clips associated with the right testicle likely represents vasectomy. Other:  Trace fluid within the abdomen and  pelvis. No intraperitoneal free gas. No organized fluid collection. Musculoskeletal: Small fat and fluid containing right inguinal hernia. Possible tiny fat containing supraumbilical hernia (7:56). Diffusely decreased bone density. Posterolateral fusion of the L4 through S1 levels with inter pedicular screw and rod fixation as well as interbody cages. Multilevel severe degenerative changes of the spine with intervertebral vacuum phenomenon. No suspicious lytic or blastic osseous lesions. No acute displaced fracture. Multilevel degenerative changes of the spine. IMPRESSION: 1. Marked pancolitis that is worse of the rectosigmoid colon. Etiology could be infectious, inflammatory, ischemic. No associated bowel obstruction or perforation. Correlate with stool cultures. 2. Other imaging findings of potential clinical significance: Cholelithiasis with no acute cholecystitis. 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 Small right inguinal hernia containing fat and fluid. Aortic Atherosclerosis (ICD10-I70.0). Electronically Signed   By: Iven Finn M.D.   On: 06/16/2020 22:57   DG Chest Port 1 View  Result Date: 06/06/2020 CLINICAL DATA:  Worsening weakness.  Fever. EXAM: PORTABLE CHEST 1 VIEW COMPARISON:  06/05/2020 FINDINGS: The right IJ catheter tip is in the SVC. ETT tip is stable above the carina. Nasogastric tube is identified with tip in the expected location of the gastric body. Stable cardiomediastinal contours. Aortic atherosclerotic calcifications. Bilateral scratch set diffuse left lung and right upper lobe pulmonary opacities mildly increased in the interval. No pneumothorax identified. IMPRESSION: 1. Worsening aeration to the lungs compared with previous exam. 2. Stable support apparatus. Electronically Signed   By: Kerby Moors M.D.   On: 06/06/2020 08:14   DG Chest Port 1 View  Result Date: 06/05/2020 CLINICAL DATA:  Post intubation EXAM: PORTABLE CHEST 1 VIEW  COMPARISON:  06/13/2020 FINDINGS: Endotracheal tube is 4.6 cm above the carina. Patchy bilateral upper lobe airspace opacities. Heart is normal size. Prior CABG. No effusions or pneumothorax. No acute bony abnormality. IMPRESSION: Endotracheal tube 4.6 cm above the carina. Patchy bilateral upper lobe airspace opacities, new since prior study. Electronically Signed   By: Rolm Baptise M.D.   On: 06/05/2020 00:17   DG Chest Port 1 View  Result Date: 06/30/2020 CLINICAL DATA:  Code sepsis.  Fever, weakness, diarrhea EXAM: PORTABLE CHEST 1 VIEW COMPARISON:  Radiograph 02/04/2020.  CT 04/08/2016 FINDINGS: Post median sternotomy and CABG. Coronary stent also visualized. Heart is normal in size aortic atherosclerosis. Mild elevation of right hemidiaphragm. There are ill-defined streaky opacities at both lung bases. No pulmonary edema. No confluent consolidation. No pleural fluid or pneumothorax. The bones are under mineralized without acute osseous abnormalities. IMPRESSION: 1. Ill-defined streaky opacities at both lung bases, favor atelectasis over pneumonia. 2. Post CABG. Normal heart size. Aortic Atherosclerosis (ICD10-I70.0). Electronically Signed   By: Keith Rake M.D.   On: 06/07/2020 20:31   DG Chest Port 1V same Day  Result Date: 06/05/2020 CLINICAL DATA:  OG tube in central line placement EXAM: PORTABLE CHEST 1 VIEW COMPARISON:  CT abdomen pelvis 06/16/2020, radiograph 06/05/2020 FINDINGS: Endotracheal tube tip terminates in the mid trachea, 4 cm from the carina. A transesophageal tube tip is seen coiling at the level of the GE junction possibly folded within the distal thoracic esophagus or within the gastric fundus. Could consider advancing at least 3 cm and reimaging. Right IJ catheter tip in the mid SVC. Additional support devices including pacer pads and telemetry leads overlie the chest. Prior sternotomy and CABG with vascular stenting. Cardiac silhouette is similar to prior portable supine  radiography accounting for differences in lung  inflation. Calcified aorta is again noted. Diffuse interstitial and patchy airspace opacities appear to be increasing within the upper lungs with some atelectatic changes elsewhere. No pneumothorax or visible effusion. No acute osseous or soft tissue abnormality. IMPRESSION: 1. Endotracheal tube tip terminates in the mid trachea, 4 cm from the carina. 2. A transesophageal tube tip is seen coiling at the level of the GE junction possibly folded within the distal thoracic esophagus or within the gastric fundus. Could consider advancing at least 3 cm and reimaging. 3. Increasing upper lobe opacities with some likely atelectatic changes in the lung bases. 4.  Aortic Atherosclerosis (ICD10-I70.0). Electronically Signed   By: Lovena Le M.D.   On: 06/05/2020 01:37   ECHOCARDIOGRAM COMPLETE  Result Date: 06/05/2020    ECHOCARDIOGRAM REPORT   Patient Name:   Samuel Gregory Date of Exam: 06/05/2020 Medical Rec #:  [id number]    Height:       66.0 in Accession #:    [id number]   Weight:       169.3 lb Date of Birth:  [birth year]    BSA:          1.863 m Patient Age:    79 years     BP:           80/56 mmHg Patient Gender: M            HR:           119 bpm. Exam Location:  [facility] Procedure: 2D Echo Indications:    Cardiac arrest I46.9  History:        Patient has prior history of Echocardiogram examinations, most                 recent 01/23/2020. CAD, Arrythmias:Atrial Flutter; Risk                 Factors:Hypertension and Dyslipidemia. Prostate Cancer, Vfib,                 Septic Shock.  Sonographer:    BW Referring Phys: [id number] [place]  1. Left ventricular ejection fraction, by estimation, is 40%. The left ventricle has normal function. The left ventricle demonstrates global hypokinesis. Left ventricular diastolic parameters are indeterminate.  2. Right ventricular systolic function was not well visualized. The right ventricular size is not well  visualized.  3. The mitral valve is normal in structure. No evidence of mitral valve regurgitation. No evidence of mitral stenosis.  4. The aortic valve is tricuspid. Aortic valve regurgitation is not visualized. No aortic stenosis is present.  5. The inferior vena cava is normal in size with greater than 50% respiratory variability, suggesting right atrial pressure of 3 mmHg. FINDINGS  Left Ventricle: Left ventricular ejection fraction, by estimation, is 40%. The left ventricle has normal function. The left ventricle demonstrates global hypokinesis. The left ventricular internal cavity size was normal in size. There is no left ventricular hypertrophy. Left ventricular diastolic parameters are indeterminate. Right Ventricle: RV poorly visualized. Grossly appears normal in size and function. The right ventricular size is not well visualized. Right vetricular wall thickness was not well visualized. Right ventricular systolic function was not well visualized. Left Atrium: Left atrial size was normal in size. Right Atrium: Right atrial size was not well visualized. Pericardium: There is no evidence of pericardial effusion. Mitral Valve: The mitral valve is normal in structure. No evidence of mitral valve regurgitation. No evidence of mitral valve stenosis. Tricuspid Valve: The tricuspid  valve is not well visualized. Tricuspid valve regurgitation is mild . No evidence of tricuspid stenosis. Aortic Valve: The aortic valve is tricuspid. Aortic valve regurgitation is not visualized. No aortic stenosis is present. Aortic valve mean gradient measures 2.0 mmHg. Aortic valve peak gradient measures 3.6 mmHg. Aortic valve area, by VTI measures 3.03 cm. Pulmonic Valve: The pulmonic valve was not well visualized. Pulmonic valve regurgitation is not visualized. No evidence of pulmonic stenosis. Aorta: The aortic root is normal in size and structure. Pulmonary Artery: 21. Venous: The inferior vena cava is normal in size with greater  than 50% respiratory variability, suggesting right atrial pressure of 3 mmHg. IAS/Shunts: No atrial level shunt detected by color flow Doppler.  LEFT VENTRICLE PLAX 2D LVIDd:         3.98 cm  Diastology LVIDs:         3.06 cm  LV e' medial:  8.92 cm/s LV PW:         1.20 cm  LV e' lateral: 8.16 cm/s LV IVS:        1.13 cm LVOT diam:     2.10 cm LV SV:         33 LV SV Index:   18 LVOT Area:     3.46 cm  RIGHT VENTRICLE RV S prime:     8.70 cm/s LEFT ATRIUM             Index       RIGHT ATRIUM           Index LA diam:        3.10 cm 1.66 cm/m  RA Area:     13.50 cm LA Vol (A2C):   37.3 ml 20.02 ml/m RA Volume:   34.20 ml  18.36 ml/m LA Vol (A4C):   24.6 ml 13.20 ml/m LA Biplane Vol: 30.3 ml 16.26 ml/m  AORTIC VALVE AV Area (Vmax):    3.15 cm AV Area (Vmean):   3.03 cm AV Area (VTI):     3.03 cm AV Vmax:           95.02 cm/s AV Vmean:          66.053 cm/s AV VTI:            0.110 m AV Peak Grad:      3.6 mmHg AV Mean Grad:      2.0 mmHg LVOT Vmax:         86.55 cm/s LVOT Vmean:        57.802 cm/s LVOT VTI:          0.096 m LVOT/AV VTI ratio: 0.88  AORTA Ao Root diam: 3.00 cm MITRAL VALVE               TRICUSPID VALVE MV Area (PHT): 7.59 cm    TR Peak grad:   21.0 mmHg MV Decel Time: 100 msec    TR Vmax:        229.00 cm/s MV A velocity: 59.10 cm/s                            SHUNTS                            Systemic VTI:  0.10 m  Systemic Diam: 2.10 cm Carlyle Dolly MD Electronically signed by Carlyle Dolly MD Signature Date/Time: 06/05/2020/4:31:35 PM    Final     Assessment: Active Problems:   Essential hypertension   Hypotension   Severe sepsis with septic shock ([place])   Encounter for central line placement   Enteritis due to Clostridium difficile   GERD (gastroesophageal reflux disease)   Pancolitis (HCC)   Acute respiratory failure with hypoxia (HCC)   Cardiac arrest (HCC)   Elevated troponin   History of atrial flutter   Colitis   Fulminant C. difficile  colitis in the setting of pneumonia, sepsis, V. fib cardiac arrest yesterday, acute kidney injury, multiple electrolyte abnormalities.  Patient remains acidotic and hypotensive.  I recommend we continue IV metronidazole, vancomycin per NG.  Place a rectal tube and administer vancomycin as previously ordered as feasible.  Overall prognosis appears to be poor at this time.  Discussed with nursing staff.

## 2020-06-06 NOTE — Progress Notes (Signed)
PROGRESS NOTE  Samuel Gregory CHY:[id number] DOB: 10/17/1940 DOA: 06/18/2020 PCP: Celene Squibb, MD  Brief History:  79 y.o.malewith a past medical history of atrial flutter, CAD status post CABG in 2000 and NST in 2019 with mild to moderate infarct ischemia and medical management recommended, hypertension, hyperlipidemia, metastatic prostate cancer presented with fever, abdominal pain and generalized weakness.  The patient was seen yesterday (06/10/2020) by GI,  Dr. Jenetta Downer for acute diarrhea.  Patient has history of C. difficile infection status post vancomycin treatment x1. At that point he noted 10 days of watery stools x3-6 episodes a day despite antidiarrheals. Denied hematochezia or melena, associated lower abdominal discomfort with stooling. No hx of recent abx.   Later that evening the patient presented to the emergency department with complaints of worsening including weakness, fever. He was noted to be tachycardic at 144, although BP good at 133/84 initially. Low-grade temp noted of 100.3.    However, while in the emergency room the patient went into spontaneous V. fib arrest and a CODE BLUE was initiated. NG tube was placed, triple-lumen catheter to the right neck was placed, the patient was intubated and placed on the ventilator due to agonal breathing even with ROSC. The code lasted approximately 10 to 12 minutes with 2 rounds of epinephrine, 1 shock, amiodarone IV push. He was subsequently started on an IV amiodarone drip. Postcode noted hypotension and tachycardia with BP initially 85/60, heart rate 139.  The patient was transferred to the ICUCT scan showed pancolonic colitis as well as stable but known pancreatic cancer. He was placed on phenylephrine and subsequently added levophed and vasopressin due to persistent hypotension, A-line placed.  Labs since admission show C. difficile quick scan positive for antigen and toxin, SARS-CoV-2 negative, GI pathogen panel in  process, blood cultures negative to date, lactic acid normal at 1.9, acute kidney injury with mild bump in serum creatinine to 1.51. Leukocytosis with a white blood cell count of 13.1, hemoglobin normal, INR mildly elevated at 1.3 initially that has since increased to 1.6. GI, PCCM, cardiology were consulted to assist.    Assessment/Plan: Septic Shock -present on admission -due to Cdiff and pneumonia -continue vanco po, IV metronidazole -PCT 2.79>>27.69 -continue levophed, phenylephrine, vasopressin -PCCM following -continue hydrocortisone  C diff Colitis -continue oral vanco/IV flagyl -GI following -CT shows pancolitis  Acute on chronic renal failure--CKD 3a -baseline creatinine 1.2-1.4 -serum creatinine peaked 2.46 -due to sepsis -continue IVF--currently on bicarb drip (3 amps) -positive 14 L -remains oliguric -foley placed 11/5 -Lasix 40 mg IV x 1  Lobar pneumonia -personally reviewed CXR--increasing upper lobe opacities on CXR -likely a component of aspiration  Acute Respiratory Failure with Hypoxia -continue mechanical ventilation -personally reviewed CXR--increase bilateral opacities -repeat CXR in am -PCCM following  Vfib Arrest -ventricular fibrillation arrest in the setting of coming in with significant diarrhea and pancolitis. He was shocked twice and receivedEpinephrine twice along withAmiodarone.  -Suspect severe systemic demand from severe abdominal infection in setting of chronically ischemic heart led to the arrhythmia -not be a cath candidate. If survives and recovers from this event would need an ischemic evaluation -appreciate cardiology -continue amiodarone -11/5 Echo--EF 40%, global HK -repeat Co-ox  CAD/Elevated Troponin - He iss/p CABG in 2000with NST in 10/2017 showing mild to moderate peri-infarct ischemia with medical management recommended. - Initial HS Troponin 12 with repeat of 818>>5229 -cardiology following--started  dobutamine 2 mcg 11/5>>  ParoxysmalAtrialFlutter - Diagnosed in  12/2019 while admitted for sepsis. He was on Eliquis prior to admission which is currently held.  -Receiving DVT prophylaxis Heparin.  - On Amiodarone as outlined above  Prostate Cancer -hx of radical prostatectomy and is currently on lupron for metastatic prostate cancer with bone mets -urology consulted and placed foley  Goals of Care -Advance care planning, including the explanation and discussion of advance directives was carried out with the patient and family.  Code status including explanations of "Full Code" and "DNR" and alternatives were discussed in detail.  Discussion of end-of-life issues including but not limited palliative care, hospice care and the concept of hospice, other end-of-life care options, power of attorney for health care decisions, living wills, and physician orders for life-sustaining treatment were also discussed with the patient and family.  Total face to face time 16 minutes. -full code confirmed with daughter    Status is: Inpatient  Remains inpatient appropriate because:IV treatments appropriate due to intensity of illness or inability to take PO  Hemodynamically unstable   Dispo: The patient is from: Home  Anticipated d/c is to: Unclear presently  Anticipated d/c date is: > 3 days  Patient currently is not medically stable to d/c.        Family Communication:  Daughter updated 11/6  Consultants:  GI, cardiology, PCCM  Code Status:  FULL   DVT Prophylaxis:  [place] Heparin    Procedures: As Listed in Progress Note Above  Antibiotics: vanco po 11/5>> Metronidazole IV 11/5>> Zosyn 11/5>>     Subjective: Patient remains sedated on vent.  Less respiratory agitation on vent.  No vomiting.  +BM  Objective: Vitals:   06/06/20 0746 06/06/20 0800 06/06/20 0802 06/06/20 0900  BP:  (!) 145/41  (!) 131/57  Pulse:  (!) 101 98  (!) 103  Resp:      Temp: 97.7 F (36.5 C)     TempSrc: Axillary     SpO2: 98% 97% 97% 96%  Weight:      Height:        Intake/Output Summary (Last 24 hours) at 06/06/2020 0927 Last data filed at 06/06/2020 0900 Gross per 24 hour  Intake 9100.78 ml  Output 250 ml  Net 8850.78 ml   Weight change:  Exam:   General:  Pt is sedated on vent.  HEENT: No icterus, No thrush, No neck mass, [place]/AT  Cardiovascular: RRR, S1/S2, no rubs, no gallops  Respiratory: bilateral rhonchi  Abdomen: Soft/+BS, non tender, non distended, no guarding  Extremities: 1+LE edema, No lymphangitis, No petechiae, No rashes, no synovitis   Data Reviewed: I have personally reviewed following labs and imaging studies Basic Metabolic Panel: Recent Labs  Lab 06/05/2020 2016 06/05/20 0234 06/05/20 1300 06/06/20 0407  NA 134* 137 134* 135  K 3.5 3.2* 4.2 2.5*  CL 99 107 111 116*  CO2 23 18* 15* 12*  GLUCOSE 137* 131* 232* 221*  BUN 23 21 21 17   CREATININE 1.51* 1.79* 2.46* 1.93*  CALCIUM 8.9 7.5* 6.2* 4.0*  MG  --  1.7 2.0 1.1*  PHOS  --  3.6  --  2.9   Liver Function Tests: Recent Labs  Lab 06/06/2020 2016 06/05/20 0234 06/06/20 0407  AST 17 57* 48*  ALT 13 37 25  ALKPHOS 50 50 35*  BILITOT 1.0 0.6 0.3  PROT 6.3* 4.7* <3.0*  ALBUMIN 3.0* 2.2* 1.1*   No results for input(s): LIPASE, AMYLASE in the last 168 hours. No results for input(s): AMMONIA in the last 168  hours. Coagulation Profile: Recent Labs  Lab 06/03/2020 2016 06/05/20 0234  INR 1.3* 1.6*   CBC: Recent Labs  Lab 06/17/2020 2016  WBC 13.1*  NEUTROABS 9.3*  HGB 13.2  HCT 40.9  MCV 99.5  PLT 314   Cardiac Enzymes: No results for input(s): CKTOTAL, CKMB, CKMBINDEX, TROPONINI in the last 168 hours. BNP: Invalid input(s): POCBNP CBG: Recent Labs  Lab 06/05/20 1549 06/05/20 2013 06/06/20 0013 06/06/20 0439 06/06/20 0748  GLUCAP 211* 297* 321* 265* 248*   HbA1C: No results for input(s): HGBA1C in the  last 72 hours. Urine analysis:    Component Value Date/Time   COLORURINE YELLOW 02/04/2020 1250   APPEARANCEUR Clear 05/20/2020 1502   LABSPEC 1.017 02/04/2020 1250   PHURINE 5.0 02/04/2020 1250   GLUCOSEU Negative 05/20/2020 1502   HGBUR NEGATIVE 02/04/2020 1250   BILIRUBINUR Negative 05/20/2020 1502   KETONESUR NEGATIVE 02/04/2020 1250   PROTEINUR 1+ (A) 05/20/2020 1502   PROTEINUR 30 (A) 02/04/2020 1250   UROBILINOGEN 0.2 10/29/2007 2036   NITRITE Negative 05/20/2020 1502   NITRITE NEGATIVE 02/04/2020 1250   LEUKOCYTESUR Negative 05/20/2020 1502   LEUKOCYTESUR NEGATIVE 02/04/2020 1250   Sepsis Labs: @LABRCNTIP (procalcitonin:4,lacticidven:4) ) Recent Results (from the past 240 hour(s))  Respiratory Panel by RT PCR (Flu A&B, Covid) - Nasopharyngeal Swab     Status: None   Collection Time: 06/02/2020  7:37 PM   Specimen: Nasopharyngeal Swab  Result Value Ref Range Status   SARS Coronavirus 2 by RT PCR NEGATIVE NEGATIVE Final    Comment: (NOTE) SARS-CoV-2 target nucleic acids are NOT DETECTED.  The SARS-CoV-2 RNA is generally detectable in upper respiratoy specimens during the acute phase of infection. The lowest concentration of SARS-CoV-2 viral copies this assay can detect is 131 copies/mL. A negative result does not preclude SARS-Cov-2 infection and should not be used as the sole basis for treatment or other patient management decisions. A negative result may occur with  improper specimen collection/handling, submission of specimen other than nasopharyngeal swab, presence of viral mutation(s) within the areas targeted by this assay, and inadequate number of viral copies (<131 copies/mL). A negative result must be combined with clinical observations, patient history, and epidemiological information. The expected result is Negative.  Fact Sheet for Patients:  PinkCheek.be  Fact Sheet for Healthcare Providers:    GravelBags.it  This test is no t yet approved or cleared by the [place] FDA and  has been authorized for detection and/or diagnosis of SARS-CoV-2 by FDA under an Emergency Use Authorization (EUA). This EUA will remain  in effect (meaning this test can be used) for the duration of the COVID-19 declaration under Section 564(b)(1) of the Act, 21 U.S.C. section 360bbb-3(b)(1), unless the authorization is terminated or revoked sooner.     Influenza A by PCR NEGATIVE NEGATIVE Final   Influenza B by PCR NEGATIVE NEGATIVE Final    Comment: (NOTE) The Xpert Xpress SARS-CoV-2/FLU/RSV assay is intended as an aid in  the diagnosis of influenza from Nasopharyngeal swab specimens and  should not be used as a sole basis for treatment. Nasal washings and  aspirates are unacceptable for Xpert Xpress SARS-CoV-2/FLU/RSV  testing.  Fact Sheet for Patients: PinkCheek.be  Fact Sheet for Healthcare Providers: GravelBags.it  This test is not yet approved or cleared by the [place] FDA and  has been authorized for detection and/or diagnosis of SARS-CoV-2 by  FDA under an Emergency Use Authorization (EUA). This EUA will remain  in effect (meaning this test can be  used) for the duration of the  Covid-19 declaration under Section 564(b)(1) of the Act, 21  U.S.C. section 360bbb-3(b)(1), unless the authorization is  terminated or revoked. Performed at [facility], [address] [place], [place] [address]   Gastrointestinal Panel by PCR , Stool     Status: None   Collection Time: 06/11/2020  7:38 PM   Specimen: STOOL  Result Value Ref Range Status   Campylobacter species NOT DETECTED NOT DETECTED Final   Plesimonas shigelloides NOT DETECTED NOT DETECTED Final   Salmonella species NOT DETECTED NOT DETECTED Final   Yersinia enterocolitica NOT DETECTED NOT DETECTED Final   Vibrio species NOT DETECTED NOT  DETECTED Final   Vibrio cholerae NOT DETECTED NOT DETECTED Final   Enteroaggregative E coli (EAEC) NOT DETECTED NOT DETECTED Final   Enteropathogenic E coli (EPEC) NOT DETECTED NOT DETECTED Final   Enterotoxigenic E coli (ETEC) NOT DETECTED NOT DETECTED Final   Shiga like toxin producing E coli (STEC) NOT DETECTED NOT DETECTED Final   Shigella/Enteroinvasive E coli (EIEC) NOT DETECTED NOT DETECTED Final   Cryptosporidium NOT DETECTED NOT DETECTED Final   Cyclospora cayetanensis NOT DETECTED NOT DETECTED Final   Entamoeba histolytica NOT DETECTED NOT DETECTED Final   Giardia lamblia NOT DETECTED NOT DETECTED Final   Adenovirus F40/41 NOT DETECTED NOT DETECTED Final   Astrovirus NOT DETECTED NOT DETECTED Final   Norovirus GI/GII NOT DETECTED NOT DETECTED Final   Rotavirus A NOT DETECTED NOT DETECTED Final   Sapovirus (I, II, IV, and V) NOT DETECTED NOT DETECTED Final    Comment: Performed at [facility], [address] [place], [place] [address]  C Difficile Quick Screen w PCR reflex     Status: Abnormal   Collection Time: 06/10/2020  7:39 PM   Specimen: STOOL  Result Value Ref Range Status   C Diff antigen POSITIVE (A) NEGATIVE Final   C Diff toxin POSITIVE (A) NEGATIVE Final    Comment: CRITICAL RESULT CALLED TO, READ BACK BY AND VERIFIED WITH: A POWELL,RN@2340  06/06/2020 MKELLY    C Diff interpretation Toxin producing C. difficile detected.  Final    Comment: Performed at [facility], [address] [place], [place] [address]  Blood Culture (routine x 2)     Status: None (Preliminary result)   Collection Time: 06/09/2020  8:14 PM   Specimen: BLOOD LEFT HAND  Result Value Ref Range Status   Specimen Description BLOOD LEFT HAND  Final   Special Requests   Final    BOTTLES DRAWN AEROBIC AND ANAEROBIC Blood Culture adequate volume   Culture   Final    NO GROWTH < 12 HOURS Performed at [facility], [address], [place], [place] [address]    Report Status PENDING   Incomplete  Blood Culture (routine x 2)     Status: None (Preliminary result)   Collection Time: 06/01/2020  8:16 PM   Specimen: BLOOD RIGHT HAND  Result Value Ref Range Status   Specimen Description BLOOD RIGHT HAND  Final   Special Requests   Final    BOTTLES DRAWN AEROBIC AND ANAEROBIC Blood Culture adequate volume   Culture   Final    NO GROWTH < 12 HOURS Performed at [facility], [address] [place], [place] [address]    Report Status PENDING  Incomplete  MRSA PCR Screening     Status: None   Collection Time: 06/05/20  3:15 AM   Specimen: Nasal Mucosa; Nasopharyngeal  Result Value Ref Range Status  MRSA by PCR NEGATIVE NEGATIVE Final    Comment:        The GeneXpert MRSA Assay (FDA approved for NASAL specimens only), is one component of a comprehensive MRSA colonization surveillance program. It is not intended to diagnose MRSA infection nor to guide or monitor treatment for MRSA infections. Performed at [facility], [address], [place], [place] [address]      Scheduled Meds: . aspirin  150 mg Rectal Daily  . chlorhexidine gluconate (MEDLINE KIT)  15 mL Mouth Rinse BID  . Chlorhexidine Gluconate Cloth  6 each Topical Daily  . furosemide  40 mg Intravenous Once  . heparin  5,000 Units Subcutaneous Q8H  . hydrocortisone sod succinate (SOLU-CORTEF) inj  50 mg Intravenous Q6H  . insulin aspart  0-9 Units Subcutaneous Q4H  . mouth rinse  15 mL Mouth Rinse 10 times per day  . pantoprazole (PROTONIX) IV  40 mg Intravenous Q24H  . sodium bicarbonate  100 mEq Intravenous Once  . vancomycin  500 mg Per Tube Q6H  . vancomycin (VANCOCIN) rectal ENEMA  500 mg Rectal Q6H   Continuous Infusions: . sodium chloride    . amiodarone 30 mg/hr (06/06/20 0900)  . calcium gluconate    . DOBUTamine 2 mcg/kg/min (06/06/20 0900)  . fentaNYL infusion INTRAVENOUS 150 mcg/hr (06/06/20 0900)  . magnesium sulfate bolus IVPB 50 mL/hr at 06/06/20 0900   Followed by  . magnesium  sulfate bolus IVPB    . metronidazole 500 mg (06/06/20 0159)  . norepinephrine (LEVOPHED) Adult infusion 25 mcg/min (06/06/20 0900)  . phenylephrine (NEO-SYNEPHRINE) Adult infusion 250 mcg/min (06/06/20 0900)  . piperacillin-tazobactam (ZOSYN)  IV 12.5 mL/hr at 06/06/20 0900  . potassium chloride 100 mL/hr at 06/06/20 0900  . sodium bicarbonate in D5W 1000 mL infusion 125 mL/hr at 06/06/20 0900  . vasopressin 0.03 Units/min (06/06/20 0900)    Procedures/Studies: CT ABDOMEN PELVIS W CONTRAST  Result Date: 06/05/2020 CLINICAL DATA:  Abdominal distension, diarrhea X 10 days. Told by GI that if it got worse to go to the ED. EXAM: CT ABDOMEN AND PELVIS WITH CONTRAST TECHNIQUE: Multidetector CT imaging of the abdomen and pelvis was performed using the standard protocol following bolus administration of intravenous contrast. CONTRAST:  98m OMNIPAQUE IOHEXOL 300 MG/ML  SOLN COMPARISON:  MRI abdomen 01/21/2020, CT abdomen pelvis 01/05/2020 FINDINGS: Lower chest: No acute abnormality. Four-vessel coronary artery calcifications status post coronary artery bypass. Hepatobiliary: No focal liver abnormality. Several calcified gallstones are noted within the gallbladder lumen. Otherwise no gallbladder wall thickening or pericholecystic fluid. No biliary dilatation. Pancreas: Redemonstration of a stable 1.5 x 1.2 pancreatic head/uncinate process lesion that was consistent with a in intraductal mucinous papillary neoplasm on MRI abdomen 01/21/2020. No other pancreatic lesion identified. Normal pancreatic contour. No surrounding inflammatory changes. No main pancreatic ductal dilatation. Spleen: Normal in size. Associated calcified density likely represents sequelae of prior granulomatous disease. Suggestion of a splenule is again noted (2:28). Adrenals/Urinary Tract: No adrenal nodule bilaterally. Nonspecific bilateral perinephric stranding. Bilateral kidneys enhance symmetrically. No hydronephrosis. No hydroureter.  The urinary bladder is unremarkable. Stomach/Bowel: Stomach is within normal limits. No evidence of small bowel wall thickening or dilatation. Extensive large bowel wall circumferential thickening and pericolonic fat stranding that is much more prominent of the rectosigmoid colon. No large bowel dilatation. No pneumatosis. Appendix appears normal. Vascular/Lymphatic: No abdominal aorta or iliac aneurysm. Focal dilatation of the infrarenal abdominal aorta with a caliber of 2.6 cm (5:55). Atherosclerotic plaque of the aorta and  its branches. No abdominal, pelvic, or inguinal lymphadenopathy. Reproductive: Prostate resection. Surgical clips associated with the right testicle likely represents vasectomy. Other: Trace fluid within the abdomen and pelvis. No intraperitoneal free gas. No organized fluid collection. Musculoskeletal: Small fat and fluid containing right inguinal hernia. Possible tiny fat containing supraumbilical hernia (9:62). Diffusely decreased bone density. Posterolateral fusion of the L4 through S1 levels with inter pedicular screw and rod fixation as well as interbody cages. Multilevel severe degenerative changes of the spine with intervertebral vacuum phenomenon. No suspicious lytic or blastic osseous lesions. No acute displaced fracture. Multilevel degenerative changes of the spine. IMPRESSION: 1. Marked pancolitis that is worse of the rectosigmoid colon. Etiology could be infectious, inflammatory, ischemic. No associated bowel obstruction or perforation. Correlate with stool cultures. 2. Other imaging findings of potential clinical significance: Cholelithiasis with no acute cholecystitis. 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 Small right inguinal hernia containing fat and fluid. Aortic Atherosclerosis (ICD10-I70.0). Electronically Signed   By: Iven Finn M.D.   On: 06/24/2020 22:57   DG Chest Port 1 View  Result Date: 06/06/2020 CLINICAL DATA:  Worsening  weakness.  Fever. EXAM: PORTABLE CHEST 1 VIEW COMPARISON:  06/05/2020 FINDINGS: The right IJ catheter tip is in the SVC. ETT tip is stable above the carina. Nasogastric tube is identified with tip in the expected location of the gastric body. Stable cardiomediastinal contours. Aortic atherosclerotic calcifications. Bilateral scratch set diffuse left lung and right upper lobe pulmonary opacities mildly increased in the interval. No pneumothorax identified. IMPRESSION: 1. Worsening aeration to the lungs compared with previous exam. 2. Stable support apparatus. Electronically Signed   By: Kerby Moors M.D.   On: 06/06/2020 08:14   DG Chest Port 1 View  Result Date: 06/05/2020 CLINICAL DATA:  Post intubation EXAM: PORTABLE CHEST 1 VIEW COMPARISON:  06/18/2020 FINDINGS: Endotracheal tube is 4.6 cm above the carina. Patchy bilateral upper lobe airspace opacities. Heart is normal size. Prior CABG. No effusions or pneumothorax. No acute bony abnormality. IMPRESSION: Endotracheal tube 4.6 cm above the carina. Patchy bilateral upper lobe airspace opacities, new since prior study. Electronically Signed   By: Rolm Baptise M.D.   On: 06/05/2020 00:17   DG Chest Port 1 View  Result Date: 06/21/2020 CLINICAL DATA:  Code sepsis.  Fever, weakness, diarrhea EXAM: PORTABLE CHEST 1 VIEW COMPARISON:  Radiograph 02/04/2020.  CT 04/08/2016 FINDINGS: Post median sternotomy and CABG. Coronary stent also visualized. Heart is normal in size aortic atherosclerosis. Mild elevation of right hemidiaphragm. There are ill-defined streaky opacities at both lung bases. No pulmonary edema. No confluent consolidation. No pleural fluid or pneumothorax. The bones are under mineralized without acute osseous abnormalities. IMPRESSION: 1. Ill-defined streaky opacities at both lung bases, favor atelectasis over pneumonia. 2. Post CABG. Normal heart size. Aortic Atherosclerosis (ICD10-I70.0). Electronically Signed   By: Keith Rake M.D.   On:  06/29/2020 20:31   DG Chest Port 1V same Day  Result Date: 06/05/2020 CLINICAL DATA:  OG tube in central line placement EXAM: PORTABLE CHEST 1 VIEW COMPARISON:  CT abdomen pelvis 06/30/2020, radiograph 06/05/2020 FINDINGS: Endotracheal tube tip terminates in the mid trachea, 4 cm from the carina. A transesophageal tube tip is seen coiling at the level of the GE junction possibly folded within the distal thoracic esophagus or within the gastric fundus. Could consider advancing at least 3 cm and reimaging. Right IJ catheter tip in the mid SVC. Additional support devices including pacer pads and telemetry leads overlie the chest.  Prior sternotomy and CABG with vascular stenting. Cardiac silhouette is similar to prior portable supine radiography accounting for differences in lung inflation. Calcified aorta is again noted. Diffuse interstitial and patchy airspace opacities appear to be increasing within the upper lungs with some atelectatic changes elsewhere. No pneumothorax or visible effusion. No acute osseous or soft tissue abnormality. IMPRESSION: 1. Endotracheal tube tip terminates in the mid trachea, 4 cm from the carina. 2. A transesophageal tube tip is seen coiling at the level of the GE junction possibly folded within the distal thoracic esophagus or within the gastric fundus. Could consider advancing at least 3 cm and reimaging. 3. Increasing upper lobe opacities with some likely atelectatic changes in the lung bases. 4.  Aortic Atherosclerosis (ICD10-I70.0). Electronically Signed   By: Lovena Le M.D.   On: 06/05/2020 01:37   ECHOCARDIOGRAM COMPLETE  Result Date: 06/05/2020    ECHOCARDIOGRAM REPORT   Patient Name:   KELLER BOUNDS Date of Exam: 06/05/2020 Medical Rec #:  [id number]    Height:       66.0 in Accession #:    [id number]   Weight:       169.3 lb Date of Birth:  01/11/1941    BSA:          1.863 m Patient Age:    72 years     BP:           80/56 mmHg Patient Gender: M            HR:            119 bpm. Exam Location:  [facility] Procedure: 2D Echo Indications:    Cardiac arrest I46.9  History:        Patient has prior history of Echocardiogram examinations, most                 recent 01/23/2020. CAD, Arrythmias:Atrial Flutter; Risk                 Factors:Hypertension and Dyslipidemia. Prostate Cancer, Vfib,                 Septic Shock.  Sonographer:    BW Referring Phys: [id number] [place]  1. Left ventricular ejection fraction, by estimation, is 40%. The left ventricle has normal function. The left ventricle demonstrates global hypokinesis. Left ventricular diastolic parameters are indeterminate.  2. Right ventricular systolic function was not well visualized. The right ventricular size is not well visualized.  3. The mitral valve is normal in structure. No evidence of mitral valve regurgitation. No evidence of mitral stenosis.  4. The aortic valve is tricuspid. Aortic valve regurgitation is not visualized. No aortic stenosis is present.  5. The inferior vena cava is normal in size with greater than 50% respiratory variability, suggesting right atrial pressure of 3 mmHg. FINDINGS  Left Ventricle: Left ventricular ejection fraction, by estimation, is 40%. The left ventricle has normal function. The left ventricle demonstrates global hypokinesis. The left ventricular internal cavity size was normal in size. There is no left ventricular hypertrophy. Left ventricular diastolic parameters are indeterminate. Right Ventricle: RV poorly visualized. Grossly appears normal in size and function. The right ventricular size is not well visualized. Right vetricular wall thickness was not well visualized. Right ventricular systolic function was not well visualized. Left Atrium: Left atrial size was normal in size. Right Atrium: Right atrial size was not well visualized. Pericardium: There is no evidence of pericardial effusion. Mitral Valve: The mitral  valve is normal in structure. No evidence of  mitral valve regurgitation. No evidence of mitral valve stenosis. Tricuspid Valve: The tricuspid valve is not well visualized. Tricuspid valve regurgitation is mild . No evidence of tricuspid stenosis. Aortic Valve: The aortic valve is tricuspid. Aortic valve regurgitation is not visualized. No aortic stenosis is present. Aortic valve mean gradient measures 2.0 mmHg. Aortic valve peak gradient measures 3.6 mmHg. Aortic valve area, by VTI measures 3.03 cm. Pulmonic Valve: The pulmonic valve was not well visualized. Pulmonic valve regurgitation is not visualized. No evidence of pulmonic stenosis. Aorta: The aortic root is normal in size and structure. Pulmonary Artery: 21. Venous: The inferior vena cava is normal in size with greater than 50% respiratory variability, suggesting right atrial pressure of 3 mmHg. IAS/Shunts: No atrial level shunt detected by color flow Doppler.  LEFT VENTRICLE PLAX 2D LVIDd:         3.98 cm  Diastology LVIDs:         3.06 cm  LV e' medial:  8.92 cm/s LV PW:         1.20 cm  LV e' lateral: 8.16 cm/s LV IVS:        1.13 cm LVOT diam:     2.10 cm LV SV:         33 LV SV Index:   18 LVOT Area:     3.46 cm  RIGHT VENTRICLE RV S prime:     8.70 cm/s LEFT ATRIUM             Index       RIGHT ATRIUM           Index LA diam:        3.10 cm 1.66 cm/m  RA Area:     13.50 cm LA Vol (A2C):   37.3 ml 20.02 ml/m RA Volume:   34.20 ml  18.36 ml/m LA Vol (A4C):   24.6 ml 13.20 ml/m LA Biplane Vol: 30.3 ml 16.26 ml/m  AORTIC VALVE AV Area (Vmax):    3.15 cm AV Area (Vmean):   3.03 cm AV Area (VTI):     3.03 cm AV Vmax:           95.02 cm/s AV Vmean:          66.053 cm/s AV VTI:            0.110 m AV Peak Grad:      3.6 mmHg AV Mean Grad:      2.0 mmHg LVOT Vmax:         86.55 cm/s LVOT Vmean:        57.802 cm/s LVOT VTI:          0.096 m LVOT/AV VTI ratio: 0.88  AORTA Ao Root diam: 3.00 cm MITRAL VALVE               TRICUSPID VALVE MV Area (PHT): 7.59 cm    TR Peak grad:   21.0 mmHg MV Decel  Time: 100 msec    TR Vmax:        229.00 cm/s MV A velocity: 59.10 cm/s                            SHUNTS                            Systemic VTI:  0.10 m  Systemic Diam: 2.10 cm Carlyle Dolly MD Electronically signed by Carlyle Dolly MD Signature Date/Time: 06/05/2020/4:31:35 PM    Final     Orson Eva, DO  [facility]  If 7PM-7AM, please contact night-coverage [email or web address] Password [email or web address] 06/06/2020, 9:27 AM   LOS: 2 days

## 2020-06-06 NOTE — Op Note (Signed)
Patient:  Samuel Gregory  DOB:  09/24/1940  MRN:  [id number]   Preop Diagnosis: Hypotension, pressor support, need for central venous access  Postop Diagnosis: Same  Procedure: Central line placement  Surgeon: Aviva Signs, MD  Anes: Local  Indications: Patient is a 79 year old white male who was on pressor support for sepsis who lost central venous access that been previously placed.  I was asked to emergently place a central line.  Due to the emergent nature, informed consent could not be obtained.  Procedure note: The procedure was done at bedside in room 4 of the ICU.  The right groin region was prepped and draped using the usual sterile technique with ChloraPrep.  Surgical site confirmation was performed.  Full sterile technique was used.  1% Xylocaine was used for local anesthesia.  The right femoral vein was accessed using the Seldinger technique without difficulty.  The guidewire was then advanced without difficulty.  An introducer was placed over the guidewire.  A triple-lumen catheter was then placed over the guidewire and the guidewire was removed.  Good backflow of venous blood was noted on aspiration of all 3 ports.  All 3 ports were flushed with saline.  It was secured in the place at the skin level using 3-0 silk sutures.  It was immediately used for IV access.  A dry sterile dressing was applied.  Complications: None  EBL: Minimal  Specimen: None

## 2020-06-06 NOTE — Progress Notes (Signed)
[facility] Progress Note Patient Name: Samuel Gregory DOB: 1940/12/05 MRN: [id number]   Date of Service  06/06/2020  HPI/Events of Note  K+ 2.5, Mg+ 1.1, Calcium 4 gm/ dl PH 7.15, PCO2 53  eICU Interventions  Electrolytes replaced per protocol, respiratory rate on the ventilator increased from 18 to 24.        [name] 06/06/2020, 6:52 AM

## 2020-06-06 NOTE — Progress Notes (Signed)
CRITICAL VALUE ALERT  Critical Value:  Calcium 6.1  Date & Time Notied:  06/06/2020 1620  Provider Notified:Dr. Tat  Orders Received/Actions taken: orders received

## 2020-06-06 NOTE — Progress Notes (Signed)
done

## 2020-06-06 NOTE — Progress Notes (Signed)
Labs reviewed remotely. Actually appears lost prior right IJ, his recent coox was drawn from a femoral line. Cannot interpret coox from a femoral line, we will continue his dobutamine at 2 for now.   Carlyle Dolly MD

## 2020-06-06 NOTE — Progress Notes (Addendum)
[facility] Progress Note Patient Name: Samuel Gregory MRN: [id number]   Date of Service  06/06/2020  HPI/Events of Note  Oliguria with a CVP of 20, patient has acute HFrEF on Dobutamine infusion.  eICU Interventions  Lasix 20 mg iv x 1.        [name] 06/06/2020, 2:43 AM

## 2020-06-07 DIAGNOSIS — K529 Noninfective gastroenteritis and colitis, unspecified: Secondary | ICD-10-CM | POA: Diagnosis not present

## 2020-06-07 DIAGNOSIS — A0472 Enterocolitis due to Clostridium difficile, not specified as recurrent: Secondary | ICD-10-CM | POA: Diagnosis not present

## 2020-06-07 DIAGNOSIS — J9601 Acute respiratory failure with hypoxia: Secondary | ICD-10-CM | POA: Diagnosis not present

## 2020-06-07 DIAGNOSIS — A419 Sepsis, unspecified organism: Secondary | ICD-10-CM | POA: Diagnosis not present

## 2020-06-07 LAB — COMPREHENSIVE METABOLIC PANEL
ALT: 48 U/L — ABNORMAL HIGH (ref 0–44)
AST: 75 U/L — ABNORMAL HIGH (ref 15–41)
Albumin: 1.5 g/dL — ABNORMAL LOW (ref 3.5–5.0)
Alkaline Phosphatase: 69 U/L (ref 38–126)
Anion gap: 11 (ref 5–15)
BUN: 27 mg/dL — ABNORMAL HIGH (ref 8–23)
CO2: 24 mmol/L (ref 22–32)
Calcium: 6.1 mg/dL — CL (ref 8.9–10.3)
Chloride: 94 mmol/L — ABNORMAL LOW (ref 98–111)
Creatinine, Ser: 3.63 mg/dL — ABNORMAL HIGH (ref 0.61–1.24)
GFR, Estimated: 16 mL/min — ABNORMAL LOW (ref >60)
Glucose, Bld: 233 mg/dL — ABNORMAL HIGH (ref 70–99)
Potassium: 4.6 mmol/L (ref 3.5–5.1)
Sodium: 129 mmol/L — ABNORMAL LOW (ref 135–145)
Total Bilirubin: 0.7 mg/dL (ref 0.3–1.2)
Total Protein: 3.9 g/dL — ABNORMAL LOW (ref 6.5–8.1)

## 2020-06-07 LAB — GLUCOSE, CAPILLARY
Glucose-Capillary: 111 mg/dL — ABNORMAL HIGH (ref 70–99)
Glucose-Capillary: 115 mg/dL — ABNORMAL HIGH (ref 70–99)
Glucose-Capillary: 156 mg/dL — ABNORMAL HIGH (ref 70–99)
Glucose-Capillary: 182 mg/dL — ABNORMAL HIGH (ref 70–99)
Glucose-Capillary: 193 mg/dL — ABNORMAL HIGH (ref 70–99)
Glucose-Capillary: 243 mg/dL — ABNORMAL HIGH (ref 70–99)

## 2020-06-07 LAB — CBC
HCT: 31.9 % — ABNORMAL LOW (ref 39.0–52.0)
Hemoglobin: 10.2 g/dL — ABNORMAL LOW (ref 13.0–17.0)
MCH: 31.5 pg (ref 26.0–34.0)
MCHC: 32 g/dL (ref 30.0–36.0)
MCV: 98.5 fL (ref 80.0–100.0)
Platelets: 249 10*3/uL (ref 150–400)
RBC: 3.24 MIL/uL — ABNORMAL LOW (ref 4.22–5.81)
RDW: 14.5 % (ref 11.5–15.5)
WBC: 17 10*3/uL — ABNORMAL HIGH (ref 4.0–10.5)
nRBC: 0.2 % (ref 0.0–0.2)

## 2020-06-07 LAB — POCT I-STAT 7, (LYTES, BLD GAS, ICA,H+H)
Acid-base deficit: 3 mmol/L — ABNORMAL HIGH (ref 0.0–2.0)
Bicarbonate: 22.6 mmol/L (ref 20.0–28.0)
Calcium, Ion: 0.95 mmol/L — ABNORMAL LOW (ref 1.15–1.40)
HCT: 26 % — ABNORMAL LOW (ref 39.0–52.0)
Hemoglobin: 8.8 g/dL — ABNORMAL LOW (ref 13.0–17.0)
O2 Saturation: 93 %
Patient temperature: 97.5
Potassium: 4.8 mmol/L (ref 3.5–5.1)
Sodium: 131 mmol/L — ABNORMAL LOW (ref 135–145)
TCO2: 24 mmol/L (ref 22–32)
pCO2 arterial: 40.8 mmHg (ref 32.0–48.0)
pH, Arterial: 7.35 (ref 7.350–7.450)
pO2, Arterial: 69 mmHg — ABNORMAL LOW (ref 83.0–108.0)

## 2020-06-07 LAB — MAGNESIUM
Magnesium: 2.7 mg/dL — ABNORMAL HIGH (ref 1.7–2.4)
Magnesium: 2.6 mg/dL — ABNORMAL HIGH (ref 1.7–2.4)

## 2020-06-07 LAB — PHOSPHORUS: Phosphorus: 4.3 mg/dL (ref 2.5–4.6)

## 2020-06-07 LAB — PROCALCITONIN: Procalcitonin: 35.55 ng/mL

## 2020-06-07 LAB — CALCIUM, IONIZED: Calcium, Ionized, Serum: 3.6 mg/dL — ABNORMAL LOW (ref 4.5–5.6)

## 2020-06-07 MED ORDER — PRISMASOL BGK 4/2.5 32-4-2.5 MEQ/L REPLACEMENT SOLN: Status: DC

## 2020-06-07 MED ORDER — PRISMASOL BGK 4/2.5 32-4-2.5 MEQ/L EC SOLN: Status: DC

## 2020-06-07 MED ORDER — ASPIRIN 81 MG PO CHEW
162.0000 mg | CHEWABLE_TABLET | Freq: Every day | ORAL | Status: DC
  Administered 2020-06-07 – 2020-06-14 (×8): 162 mg
  Filled 2020-06-07 (×8): qty 2

## 2020-06-07 MED ORDER — CALCIUM GLUCONATE-NACL 1-0.675 GM/50ML-% IV SOLN
1.0000 g | Freq: Once | INTRAVENOUS | Status: AC
  Administered 2020-06-07: 1000 mg via INTRAVENOUS
  Filled 2020-06-07: qty 50

## 2020-06-07 MED ORDER — ALBUMIN HUMAN 25 % IV SOLN
25.0000 g | Freq: Two times a day (BID) | INTRAVENOUS | Status: DC
  Administered 2020-06-07: 25 g via INTRAVENOUS
  Filled 2020-06-07: qty 100

## 2020-06-07 MED ORDER — VITAL HIGH PROTEIN PO LIQD
1000.0000 mL | ORAL | Status: DC
  Administered 2020-06-07: 1000 mL

## 2020-06-07 MED ORDER — SODIUM CHLORIDE 0.9 % FOR CRRT: INTRAVENOUS_CENTRAL | Status: DC | PRN

## 2020-06-07 MED ORDER — ALTEPLASE 2 MG IJ SOLR: 2.0000 mg | Freq: Once | INTRAMUSCULAR | Status: DC | PRN

## 2020-06-07 MED ORDER — FUROSEMIDE 10 MG/ML IJ SOLN
160.0000 mg | Freq: Once | INTRAVENOUS | Status: DC
  Filled 2020-06-07: qty 16

## 2020-06-07 MED ORDER — INSULIN ASPART 100 UNIT/ML ~~LOC~~ SOLN
1.0000 [IU] | SUBCUTANEOUS | Status: DC
  Administered 2020-06-07: 2 [IU] via SUBCUTANEOUS
  Administered 2020-06-08: 3 [IU] via SUBCUTANEOUS
  Administered 2020-06-08 – 2020-06-09 (×2): 2 [IU] via SUBCUTANEOUS
  Administered 2020-06-09: 3 [IU] via SUBCUTANEOUS
  Administered 2020-06-09 – 2020-06-10 (×8): 2 [IU] via SUBCUTANEOUS
  Administered 2020-06-10: 1 [IU] via SUBCUTANEOUS
  Administered 2020-06-10: 2 [IU] via SUBCUTANEOUS
  Administered 2020-06-11: 3 [IU] via SUBCUTANEOUS
  Administered 2020-06-11: 2 [IU] via SUBCUTANEOUS
  Administered 2020-06-11: 1 [IU] via SUBCUTANEOUS
  Administered 2020-06-11: 2 [IU] via SUBCUTANEOUS
  Administered 2020-06-11 (×2): 3 [IU] via SUBCUTANEOUS
  Administered 2020-06-12 (×2): 2 [IU] via SUBCUTANEOUS
  Administered 2020-06-12: 1 [IU] via SUBCUTANEOUS
  Administered 2020-06-12 – 2020-06-14 (×12): 2 [IU] via SUBCUTANEOUS

## 2020-06-07 MED ORDER — HEPARIN SODIUM (PORCINE) 1000 UNIT/ML DIALYSIS
1000.0000 [IU] | INTRAMUSCULAR | Status: DC | PRN
  Administered 2020-06-13: 3000 [IU] via INTRAVENOUS_CENTRAL
  Filled 2020-06-07: qty 6
  Filled 2020-06-07: qty 3
  Filled 2020-06-07: qty 4
  Filled 2020-06-07: qty 6

## 2020-06-07 MED ORDER — PIPERACILLIN-TAZOBACTAM 3.375 G IVPB: 3.3750 g | Freq: Two times a day (BID) | INTRAVENOUS | Status: DC

## 2020-06-07 MED ORDER — PROSOURCE TF PO LIQD
45.0000 mL | Freq: Two times a day (BID) | ORAL | Status: DC
  Administered 2020-06-07 – 2020-06-08 (×3): 45 mL
  Filled 2020-06-07 (×3): qty 45

## 2020-06-07 MED ORDER — FUROSEMIDE 10 MG/ML IJ SOLN
160.0000 mg | Freq: Once | INTRAVENOUS | Status: AC
  Administered 2020-06-07: 160 mg via INTRAVENOUS
  Filled 2020-06-07: qty 16

## 2020-06-07 MED ORDER — HEPARIN SODIUM (PORCINE) 5000 UNIT/ML IJ SOLN
INTRAMUSCULAR | Status: AC
  Administered 2020-06-07: 2000 [IU]
  Filled 2020-06-07: qty 4

## 2020-06-07 MED ORDER — DEXTROSE 10 % IV SOLN: INTRAVENOUS | Status: DC

## 2020-06-07 MED ORDER — INSULIN ASPART 100 UNIT/ML ~~LOC~~ SOLN
3.0000 [IU] | SUBCUTANEOUS | Status: DC
  Administered 2020-06-07 – 2020-06-14 (×40): 3 [IU] via SUBCUTANEOUS

## 2020-06-07 MED ORDER — FUROSEMIDE 10 MG/ML IJ SOLN: 40.0000 mg | Freq: Once | INTRAMUSCULAR | Status: DC

## 2020-06-07 NOTE — Consult Note (Signed)
NAME:  Samuel Gregory, MRN:  [id number], DOB:  1941/01/18, LOS: 3 ADMISSION DATE:  06/19/2020, CONSULTATION DATE:  11/5 REFERRING MD:  [facility], CHIEF COMPLAINT:  Shock/ resp failure from [facility]  Brief History   48 yowm never smoker with h/o prostate ca and prior c diff infection admitted 11/4 with voluminous diarrhea and abd pain and coded in ER   VF arrest > admit to ICU with pancolitis on admit CT / vent dep/ pressor dep and PCCM consulted am 11/5   History of present illness   79 y.o. male with past medical history of atrial fibrillation on Eliquis, coronary artery disease status post CABG in 2000, hypertension, hyperlipidemia, GERD, HLD,metastatic prostate cancer and history of C. difficile infection x1, who presented for evaluation of acute onset of diarrhea and arrested as w/u in progress.   Per his daugher he was ambulatory prior to acute illness   Past Medical History    Past Medical History:  Diagnosis Date  . Arthritis   . Atrial flutter ([place])    a. diagnosed in 12/2019 while admitted for sepsis  . Collagen vascular disease ([place])   . Coronary atherosclerosis of native coronary artery    a. s/p CABG in 2000 b. NST in 10/2017 showing mild to moderate per-infarct ischemia with medical management recommended  . Essential hypertension   . Hyperlipidemia   . Prostate cancer ([place])      Significant Hospital Events   VF arrest pm 11/4 > amiodarone drip 11/5 shock due to pancolitis  Consults:  PCCM 11/5  Urology 11/5 Cards   11/5   Procedures:  ET  11/5 >>> R IJ  11/5 >>> R Rad Art line 11/5 >>>  Significant Diagnostic Tests:   CT abd 11/4 1. Marked pancolitis that is worse of the rectosigmoid colon. Etiology could be infectious, inflammatory, ischemic. No associated bowel obstruction or perforation. Correlate with stool cultures. 2. Other imaging findings of potential clinical significance: Cholelithiasis with no acute cholecystitis. 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 Small right inguinal hernia containing fat and fluid. Aortic Atherosclerosis  Micro Data:  COVID   19  PCR  11/4 Neg  Flu A/B    PCR   11/4  Neg  Stool 11/4 neg C diff antigen/ toxix  11/4  Both POS BC x 2  11/4 >>> MRSA PCR 11/5 neg   Antimicrobials:  Rocephin  11/4  Cipro  11/4  Flagyl  11/4  >>> Zosyn  11/4  >>> Vanc per rectalog  tube11/5  >>>  Scheduled Meds: . aspirin  162 mg Per Tube Daily  . chlorhexidine gluconate (MEDLINE KIT)  15 mL Mouth Rinse BID  . Chlorhexidine Gluconate Cloth  6 each Topical Daily  . heparin  5,000 Units Subcutaneous Q8H  . hydrocortisone sod succinate (SOLU-CORTEF) inj  50 mg Intravenous Q6H  . insulin aspart  0-9 Units Subcutaneous Q4H  . mouth rinse  15 mL Mouth Rinse 10 times per day  . pantoprazole (PROTONIX) IV  40 mg Intravenous Q24H  . sodium chloride flush  10-40 mL Intracatheter Q12H  . vancomycin  500 mg Per Tube Q6H  . vancomycin (VANCOCIN) rectal ENEMA  500 mg Rectal Q6H   Continuous Infusions: . sodium chloride    . amiodarone 30 mg/hr (06/07/20 1103)  . calcium gluconate    . fentaNYL infusion INTRAVENOUS 150 mcg/hr (06/07/20 1103)  . furosemide    . metronidazole Stopped (06/07/20 0848)  . norepinephrine (LEVOPHED) Adult  infusion 19 mcg/min (06/07/20 1103)  . phenylephrine (NEO-SYNEPHRINE) Adult infusion 80 mcg/min (06/07/20 1306)  . vasopressin 0.03 Units/min (06/07/20 1103)   PRN Meds:.midazolam, sodium chloride flush   Interim history/subjective:  Received in transfer. Shock improving and patient has been following commands. No urine output.  Objective   Blood pressure 126/68, pulse 82, temperature 97.7 F (36.5 C), temperature source Oral, resp. rate (!) 22, height 5' 6"  (1.676 m), weight 89.5 kg, SpO2 99 %. CVP:  [8 mmHg-32 mmHg] 32 mmHg  Vent Mode: PRVC FiO2 (%):  [80 %-90 %] 80 % Set Rate:  [22 bmp] 22 bmp Vt Set:  [510 mL] 510 mL PEEP:  [5 cmH20] 5 cmH20 Plateau  Pressure:  [19 cmH20-32 cmH20] 30 cmH20   Intake/Output Summary (Last 24 hours) at 06/07/2020 1323 Last data filed at 06/07/2020 1103 Gross per 24 hour  Intake 5795.26 ml  Output 1450 ml  Net 4345.26 ml   Filed Weights   06/05/20 0349 06/07/20 0455 06/07/20 1245  Weight: 76.8 kg 89.5 kg 89.5 kg   CVP:  [8 mmHg-32 mmHg] 32 mmHg   Examination:  General: acutely and chronically ill appearing elderly wm sedated on vent  HENT: oral et/ og Lungs: distant bs Cardiovascular: RRR  ST on amiodarone drip Abdomen: markedly distended  Extremities: Severe generalized edema particular of the scrotum. Neuro: sedate, will follow commands weakly.     Resolved Hospital Problem list      Assessment & Plan:  Critically ill due to acute hypoxemic and hypercarbic respiratory failure requiring mechanical ventilation Critically ill due to septic shock secondary to C. difficile colitis requiring titration of vasopressors C. difficile pancolitis Acute kidney injury with anuria Metastatic prostate cancer with bone metastases, these appears stable Stable pancreatic mass  Plan:  -Wean off dobutamine and phenylephrine. -Titrate norepinephrine and vasopressin to keep MAP greater than 65 -Continue full ventilatory support titrate to normal blood gas -Continue vancomycin enemas and Flagyl for C. difficile colitis -Stop Zosyn. -Place dialysis catheter for CRRT unless responds to trial of furosemide, although unlikely to do so.   Best practice:  Diet: npo -start tube feeds Pain/Anxiety/Delirium protocol (if indicated): sedated on fent/ versed prn VAP protocol (if indicated): Bundle in place DVT prophylaxis: Unfractioned heparin GI prophylaxis: PPI Glucose control: Phase 1 glycemic control Mobility: bed rest Code Status: Full code Family Communication: daughter at bedside updated by hospitalist Disposition: ICU  Labs   CBC: Recent Labs  Lab 06/15/2020 2016 06/06/20 0407 06/06/20 1400  06/07/20 0448  WBC 13.1* 13.0* 16.1* 17.0*  NEUTROABS 9.3*  --   --   --   HGB 13.2 8.5* 10.3* 10.2*  HCT 40.9 26.6* 31.7* 31.9*  MCV 99.5 102.3* 96.9 98.5  PLT 314 189 223 151    Basic Metabolic Panel: Recent Labs  Lab 06/05/20 0234 06/05/20 0234 06/05/20 1300 06/06/20 0407 06/06/20 1400 06/06/20 2040 06/07/20 0448  NA 137  --  134* 135 130*  --  129*  K 3.2*  --  4.2 2.5* 4.6  --  4.6  CL 107  --  111 116* 101  --  94*  CO2 18*  --  15* 12* 20*  --  24  GLUCOSE 131*  --  232* 221* 261*  --  233*  BUN 21  --  21 17 26*  --  27*  CREATININE 1.79*  --  2.46* 1.93* 3.30*  --  3.63*  CALCIUM 7.5*  --  6.2* 4.0* 6.1*  --  6.1*  MG 1.7   < > 2.0 1.1* 2.8* 2.7* 2.7*  PHOS 3.6  --   --  2.9  --   --   --    < > = values in this interval not displayed.   GFR: Estimated Creatinine Clearance: 17.3 mL/min (A) (by C-G formula based on SCr of 3.63 mg/dL (H)). Recent Labs  Lab 06/27/2020 2016 06/05/20 0234 06/06/20 0407 06/06/20 1400 06/07/20 0448  PROCALCITON  --  2.79 27.69  --  35.55  WBC 13.1*  --  13.0* 16.1* 17.0*  LATICACIDVEN 1.9  --   --   --   --     Liver Function Tests: Recent Labs  Lab 06/10/2020 2016 06/05/20 0234 06/06/20 0407 06/07/20 0448  AST 17 57* 48* 75*  ALT 13 37 25 48*  ALKPHOS 50 50 35* 69  BILITOT 1.0 0.6 0.3 0.7  PROT 6.3* 4.7* <3.0* 3.9*  ALBUMIN 3.0* 2.2* 1.1* 1.5*   No results for input(s): LIPASE, AMYLASE in the last 168 hours. No results for input(s): AMMONIA in the last 168 hours.  ABG    Component Value Date/Time   PHART 7.152 (LL) 06/06/2020 0610   PCO2ART 53.1 (H) 06/06/2020 0610   PO2ART 79.4 (L) 06/06/2020 0610   HCO3 15.7 (L) 06/06/2020 0610   ACIDBASEDEF 9.5 (H) 06/06/2020 0610   O2SAT 52.7 06/06/2020 1120     Coagulation Profile: Recent Labs  Lab 06/09/2020 2016 06/05/20 0234  INR 1.3* 1.6*    Cardiac Enzymes: No results for input(s): CKTOTAL, CKMB, CKMBINDEX, TROPONINI in the last 168 hours.  HbA1C: Hgb A1c  MFr Bld  Date/Time Value Ref Range Status  06/06/2020 02:00 PM 6.1 (H) 4.8 - 5.6 % Final    Comment:    (NOTE) Pre diabetes:          5.7%-6.4%  Diabetes:              >6.4%  Glycemic control for   <7.0% adults with diabetes     CBG: Recent Labs  Lab 06/06/20 2014 06/06/20 2338 06/07/20 0331 06/07/20 0712 06/07/20 1124  GLUCAP 203* 209* 193* 243* 182*    CRITICAL CARE Performed by: Kipp Brood   Total critical care time: 45 minutes  Critical care time was exclusive of separately billable procedures and treating other patients.  Critical care was necessary to treat or prevent imminent or life-threatening deterioration.  Critical care was time spent personally by me on the following activities: development of treatment plan with patient and/or surrogate as well as nursing, discussions with consultants, evaluation of patient's response to treatment, examination of patient, obtaining history from patient or surrogate, ordering and performing treatments and interventions, ordering and review of laboratory studies, ordering and review of radiographic studies, pulse oximetry, re-evaluation of patient's condition and participation in multidisciplinary rounds.  Kipp Brood, MD [facility] ICU Physician [place]  Pager: [phone] Mobile: [phone] After hours: [phone].

## 2020-06-07 NOTE — Progress Notes (Addendum)
PROGRESS NOTE  Samuel Gregory:[id number] DOB: 1941-03-16 DOA: 06/13/2020 PCP: Celene Squibb, MD  Brief History: 79 y.o.malewith a past medical history of atrial flutter, CAD status post CABG in 2000 and NST in 2019 with mild to moderate infarct ischemia and medical management recommended,hypertension, hyperlipidemia, metastatic prostate cancerpresented with fever, abdominal pain and generalized weakness.The patient was seen yesterday(06/15/2020)by GI,Dr. Jenetta Downer for acute diarrhea. Patient has history of C. difficile infection status post vancomycin treatment x1. At that point he noted 10 days of watery stools x3-6 episodes a day despite antidiarrheals. Denied hematochezia or melena, associated lower abdominal discomfort with stooling.No hx of recent abx.Later that evening the patient presented to the emergency department with complaints of worsening including weakness, fever. He was noted to be tachycardic at 144, although BP good at 133/84 initially. Low-grade temp notedof 100.3.  However, while in the emergency room the patient went into spontaneous V. fib arrest and a CODE BLUE was initiated. NG tube was placed, triple-lumen catheter to the right neck was placed, the patient was intubated and placed on the ventilator due to agonal breathing even with ROSC. The code lasted approximately 10 to 12 minutes with 2 rounds of epinephrine, 1 shock, amiodarone IV push. He was subsequently started on an IV amiodarone drip. Postcode noted hypotension and tachycardia with BP initially 85/60, heart rate 139. The patient was transferred to the ICUCT scan showed pancolonic colitis as well as stable but known pancreatic cancer. He was placed on phenylephrineand subsequently added levophed and vasopressin due to persistent hypotension, A-line placed. Labs since admission show C. difficile quick scan positive for antigen and toxin, SARS-CoV-2 negative, GI pathogen panel in  process, blood cultures negative to date, lactic acid normal at 1.9, acute kidney injury with mild bump in serum creatinine to 1.51. Leukocytosis with a white blood cell count of 13.1, hemoglobin normal, INR mildly elevated at 1.3 initially that has since increased to 1.6. GI, PCCM, cardiology were consulted to assist.  Assessment/Plan: Septic Shock -present on admission -due to Cdiff and pneumonia -continue vanco po, IV metronidazole -PCT 2.79>>27.69>>35.55 -remains levophed, phenylephrine, vasopressin although doses trending down (initially maxed out) -PCCM following -continue hydrocortisone  C diff Colitis -continue oral vanco/IV flagyl/vanco enemas -GI following -CT shows pancolitis  Acute on chronic renal failure--CKD 3a -baseline creatinine 1.2-1.4 -serum creatinine peaked 3.63 -due to sepsis -continue IVF--currently on bicarb drip (3 amps) -positive 21 L -remains oliguric, essentially anuric -foley placed 11/5 -Lasix 40 mg IV x 1 on 11/6 -nephrology consulted--discussed with Dr. Peeples>>>will need to transfer to [facility] ICU for CRT  Lobar pneumonia -personally reviewed CXR--increasing upper lobe opacities on CXR -likely a component of aspiration  Acute Respiratory Failure with Hypoxia -continue mechanical ventilation -remains on PRVC -personally reviewed CXR--increase bilateral opacities -repeat CXR in am -PCCM following  Vfib Arrest -ventricular fibrillation arrest in the setting of coming in with significant diarrhea and pancolitis. He was shocked twice and receivedEpinephrine twice along withAmiodarone. -Suspect severe systemic demand from severe abdominal infection in setting of chronically ischemic heart led to the arrhythmia -not be a cath candidate. If survives and recovers from this event would need an ischemic evaluation -appreciate cardiology -continue amiodarone at 30 mg/hr -11/5 Echo--EF 40%, global HK -11/7 repeat Co-ox not valid due  to collection from femoral line -continue dobutamine at 2 mcg  CAD/Elevated Troponin - He iss/p CABG in 2000with NST in 10/2017 showing mild to moderate peri-infarct ischemia with  medical management recommended. - Initial HS Troponin 12 with repeat of 818>>5229 -cardiology following--started dobutamine 2 mcg on11/5>>  ParoxysmalAtrialFlutter - Diagnosed in 12/2019 while admitted for sepsis. He was on Eliquis prior to admission which is currently held. -Receiving DVT prophylaxis Heparin.  - On Amiodarone as outlined above -HRs remain 115-125  Hypocalcemia -corrected calcium 8.10 -multiple amps of calcium gluconate given  Prostate Cancer -hx of radical prostatectomy and is currently on lupron for metastatic prostate cancer with bone mets -urology consulted and placed foley 11/5  Goals of Care -Advance care planning, including the explanation and discussion of advance directives was carried out with the patient and family. Code status including explanations of "Full Code" and "DNR" and alternatives were discussed in detail. Discussion of end-of-life issues including but not limited palliative care, hospice care and the concept of hospice, other end-of-life care options, power of attorney for health care decisions, living wills, and physician orders for life-sustaining treatment were also discussed with the patient and family. Total face to face time 16 minutes. -multiple GOC discussions with daughter and spouse -full code confirmed with daughter and spouse    Status is: Inpatient  Remains inpatient appropriate because:IV treatments appropriate due to intensity of illness or inability to take PO Hemodynamically unstable   Dispo: The patient is from:Home Anticipated d/c is to:Unclear presently Anticipated d/c date is: > 3 days Patient currently is not medically stable to d/c. Transferring to [facility] for CRT  initiation       Family Communication:Daughter updated 11/7  Consultants:Renal, GI, cardiology, PCCM  Code Status: FULL   DVT Prophylaxis: [place] Heparin    Procedures: As Listed in Progress Note Above  Antibiotics: vanco po 11/5>> Metronidazole IV 11/5>> Zosyn 11/5>>    Subjective: Patient is sedated on vent.  No significant events overnight.  No vomiting, respiratory distress, uncontrolled pain.  Objective: Vitals:   06/07/20 0716 06/07/20 0800 06/07/20 0817 06/07/20 0900  BP:  (!) 122/43  (!) 145/23  Pulse: (!) 111 100  (!) 110  Resp:      Temp: (!) 96.8 F (36 C)     TempSrc: Axillary     SpO2: 99% 98% 99% 98%  Weight:      Height:        Intake/Output Summary (Last 24 hours) at 06/07/2020 1014 Last data filed at 06/07/2020 0852 Gross per 24 hour  Intake 6814.21 ml  Output 1450 ml  Net 5364.21 ml   Weight change:  Exam:   General:  Pt is sedated on vent  HEENT: No icterus, No thrush, No neck mass, [place]/AT  Cardiovascular: RRR, S1/S2, no rubs, no gallops  Respiratory:bilateral rhonchi  Abdomen: Soft/+BS, non tender, non distended, no guarding  Extremities: 1+ edema, No lymphangitis, No petechiae, No rashes, no synovitis   Data Reviewed: I have personally reviewed following labs and imaging studies Basic Metabolic Panel: Recent Labs  Lab 06/05/20 0234 06/05/20 0234 06/05/20 1300 06/06/20 0407 06/06/20 1400 06/06/20 2040 06/07/20 0448  NA 137  --  134* 135 130*  --  129*  K 3.2*  --  4.2 2.5* 4.6  --  4.6  CL 107  --  111 116* 101  --  94*  CO2 18*  --  15* 12* 20*  --  24  GLUCOSE 131*  --  232* 221* 261*  --  233*  BUN 21  --  21 17 26*  --  27*  CREATININE 1.79*  --  2.46* 1.93* 3.30*  --  3.63*  CALCIUM 7.5*  --  6.2* 4.0* 6.1*  --  6.1*  MG 1.7   < > 2.0 1.1* 2.8* 2.7* 2.7*  PHOS 3.6  --   --  2.9  --   --   --    < > = values in this interval not displayed.   Liver Function Tests: Recent Labs  Lab  06/20/2020 2016 06/05/20 0234 06/06/20 0407 06/07/20 0448  AST 17 57* 48* 75*  ALT 13 37 25 48*  ALKPHOS 50 50 35* 69  BILITOT 1.0 0.6 0.3 0.7  PROT 6.3* 4.7* <3.0* 3.9*  ALBUMIN 3.0* 2.2* 1.1* 1.5*   No results for input(s): LIPASE, AMYLASE in the last 168 hours. No results for input(s): AMMONIA in the last 168 hours. Coagulation Profile: Recent Labs  Lab 06/01/2020 2016 06/05/20 0234  INR 1.3* 1.6*   CBC: Recent Labs  Lab 06/23/2020 2016 06/06/20 0407 06/06/20 1400 06/07/20 0448  WBC 13.1* 13.0* 16.1* 17.0*  NEUTROABS 9.3*  --   --   --   HGB 13.2 8.5* 10.3* 10.2*  HCT 40.9 26.6* 31.7* 31.9*  MCV 99.5 102.3* 96.9 98.5  PLT 314 189 223 249   Cardiac Enzymes: No results for input(s): CKTOTAL, CKMB, CKMBINDEX, TROPONINI in the last 168 hours. BNP: Invalid input(s): POCBNP CBG: Recent Labs  Lab 06/06/20 1622 06/06/20 2014 06/06/20 2338 06/07/20 0331 06/07/20 0712  GLUCAP 232* 203* 209* 193* 243*   HbA1C: Recent Labs    06/06/20 1400  HGBA1C 6.1*   Urine analysis:    Component Value Date/Time   COLORURINE YELLOW 02/04/2020 1250   APPEARANCEUR Clear 05/20/2020 1502   LABSPEC 1.017 02/04/2020 1250   PHURINE 5.0 02/04/2020 1250   GLUCOSEU Negative 05/20/2020 1502   HGBUR NEGATIVE 02/04/2020 1250   BILIRUBINUR Negative 05/20/2020 1502   KETONESUR NEGATIVE 02/04/2020 1250   PROTEINUR 1+ (A) 05/20/2020 1502   PROTEINUR 30 (A) 02/04/2020 1250   UROBILINOGEN 0.2 10/29/2007 2036   NITRITE Negative 05/20/2020 1502   NITRITE NEGATIVE 02/04/2020 1250   LEUKOCYTESUR Negative 05/20/2020 1502   LEUKOCYTESUR NEGATIVE 02/04/2020 1250   Sepsis Labs: @LABRCNTIP (procalcitonin:4,lacticidven:4) ) Recent Results (from the past 240 hour(s))  Respiratory Panel by RT PCR (Flu A&B, Covid) - Nasopharyngeal Swab     Status: None   Collection Time: 06/28/2020  7:37 PM   Specimen: Nasopharyngeal Swab  Result Value Ref Range Status   SARS Coronavirus 2 by RT PCR NEGATIVE  NEGATIVE Final    Comment: (NOTE) SARS-CoV-2 target nucleic acids are NOT DETECTED.  The SARS-CoV-2 RNA is generally detectable in upper respiratoy specimens during the acute phase of infection. The lowest concentration of SARS-CoV-2 viral copies this assay can detect is 131 copies/mL. A negative result does not preclude SARS-Cov-2 infection and should not be used as the sole basis for treatment or other patient management decisions. A negative result may occur with  improper specimen collection/handling, submission of specimen other than nasopharyngeal swab, presence of viral mutation(s) within the areas targeted by this assay, and inadequate number of viral copies (<131 copies/mL). A negative result must be combined with clinical observations, patient history, and epidemiological information. The expected result is Negative.  Fact Sheet for Patients:  PinkCheek.be  Fact Sheet for Healthcare Providers:  GravelBags.it  This test is no t yet approved or cleared by the [place] FDA and  has been authorized for detection and/or diagnosis of SARS-CoV-2 by FDA under an Emergency Use Authorization (EUA). This EUA will remain  in effect (meaning this test can be used) for the duration of the COVID-19 declaration under Section 564(b)(1) of the Act, 21 U.S.C. section 360bbb-3(b)(1), unless the authorization is terminated or revoked sooner.     Influenza A by PCR NEGATIVE NEGATIVE Final   Influenza B by PCR NEGATIVE NEGATIVE Final    Comment: (NOTE) The Xpert Xpress SARS-CoV-2/FLU/RSV assay is intended as an aid in  the diagnosis of influenza from Nasopharyngeal swab specimens and  should not be used as a sole basis for treatment. Nasal washings and  aspirates are unacceptable for Xpert Xpress SARS-CoV-2/FLU/RSV  testing.  Fact Sheet for Patients: PinkCheek.be  Fact Sheet for Healthcare  Providers: GravelBags.it  This test is not yet approved or cleared by the [place] FDA and  has been authorized for detection and/or diagnosis of SARS-CoV-2 by  FDA under an Emergency Use Authorization (EUA). This EUA will remain  in effect (meaning this test can be used) for the duration of the  Covid-19 declaration under Section 564(b)(1) of the Act, 21  U.S.C. section 360bbb-3(b)(1), unless the authorization is  terminated or revoked. Performed at [facility], [address] [place], [place] [address]   Gastrointestinal Panel by PCR , Stool     Status: None   Collection Time: 06/27/2020  7:38 PM   Specimen: STOOL  Result Value Ref Range Status   Campylobacter species NOT DETECTED NOT DETECTED Final   Plesimonas shigelloides NOT DETECTED NOT DETECTED Final   Salmonella species NOT DETECTED NOT DETECTED Final   Yersinia enterocolitica NOT DETECTED NOT DETECTED Final   Vibrio species NOT DETECTED NOT DETECTED Final   Vibrio cholerae NOT DETECTED NOT DETECTED Final   Enteroaggregative E coli (EAEC) NOT DETECTED NOT DETECTED Final   Enteropathogenic E coli (EPEC) NOT DETECTED NOT DETECTED Final   Enterotoxigenic E coli (ETEC) NOT DETECTED NOT DETECTED Final   Shiga like toxin producing E coli (STEC) NOT DETECTED NOT DETECTED Final   Shigella/Enteroinvasive E coli (EIEC) NOT DETECTED NOT DETECTED Final   Cryptosporidium NOT DETECTED NOT DETECTED Final   Cyclospora cayetanensis NOT DETECTED NOT DETECTED Final   Entamoeba histolytica NOT DETECTED NOT DETECTED Final   Giardia lamblia NOT DETECTED NOT DETECTED Final   Adenovirus F40/41 NOT DETECTED NOT DETECTED Final   Astrovirus NOT DETECTED NOT DETECTED Final   Norovirus GI/GII NOT DETECTED NOT DETECTED Final   Rotavirus A NOT DETECTED NOT DETECTED Final   Sapovirus (I, II, IV, and V) NOT DETECTED NOT DETECTED Final    Comment: Performed at [facility], [address] [place], [place]  [address]  C Difficile Quick Screen w PCR reflex     Status: Abnormal   Collection Time: 06/09/2020  7:39 PM   Specimen: STOOL  Result Value Ref Range Status   C Diff antigen POSITIVE (A) NEGATIVE Final   C Diff toxin POSITIVE (A) NEGATIVE Final    Comment: CRITICAL RESULT CALLED TO, READ BACK BY AND VERIFIED WITH: A POWELL,RN@2340  06/19/2020 MKELLY    C Diff interpretation Toxin producing C. difficile detected.  Final    Comment: Performed at [facility], [address] [place], [place] [address]  Blood Culture (routine x 2)     Status: None (Preliminary result)   Collection Time: 06/12/2020  8:14 PM   Specimen: BLOOD LEFT HAND  Result Value Ref Range Status   Specimen Description BLOOD LEFT HAND  Final   Special Requests   Final    BOTTLES DRAWN AEROBIC AND ANAEROBIC  Blood Culture adequate volume   Culture   Final    NO GROWTH 3 DAYS Performed at [facility], [address] [place], [place] [address]    Report Status PENDING  Incomplete  Blood Culture (routine x 2)     Status: None (Preliminary result)   Collection Time: 06/13/2020  8:16 PM   Specimen: BLOOD RIGHT HAND  Result Value Ref Range Status   Specimen Description BLOOD RIGHT HAND  Final   Special Requests   Final    BOTTLES DRAWN AEROBIC AND ANAEROBIC Blood Culture adequate volume   Culture   Final    NO GROWTH 3 DAYS Performed at [facility], [address], [place], [place] [address]    Report Status PENDING  Incomplete  MRSA PCR Screening     Status: None   Collection Time: 06/05/20  3:15 AM   Specimen: Nasal Mucosa; Nasopharyngeal  Result Value Ref Range Status   MRSA by PCR NEGATIVE NEGATIVE Final    Comment:        The GeneXpert MRSA Assay (FDA approved for NASAL specimens only), is one component of a comprehensive MRSA colonization surveillance program. It is not intended to diagnose MRSA infection nor to guide or monitor treatment for MRSA infections. Performed at [facility], [address]  [place], [place] [address]      Scheduled Meds: . aspirin  150 mg Rectal Daily  . chlorhexidine gluconate (MEDLINE KIT)  15 mL Mouth Rinse BID  . Chlorhexidine Gluconate Cloth  6 each Topical Daily  . heparin  5,000 Units Subcutaneous Q8H  . hydrocortisone sod succinate (SOLU-CORTEF) inj  50 mg Intravenous Q6H  . insulin aspart  0-9 Units Subcutaneous Q4H  . mouth rinse  15 mL Mouth Rinse 10 times per day  . pantoprazole (PROTONIX) IV  40 mg Intravenous Q24H  . sodium chloride flush  10-40 mL Intracatheter Q12H  . vancomycin  500 mg Per Tube Q6H  . vancomycin (VANCOCIN) rectal ENEMA  500 mg Rectal Q6H   Continuous Infusions: . sodium chloride    . albumin human 60 mL/hr at 06/07/20 0852  . amiodarone 30 mg/hr (06/07/20 0852)  . DOBUTamine 2 mcg/kg/min (06/07/20 0852)  . fentaNYL infusion INTRAVENOUS 150 mcg/hr (06/07/20 0852)  . metronidazole Stopped (06/07/20 0848)  . norepinephrine (LEVOPHED) Adult infusion 19 mcg/min (06/07/20 0852)  . phenylephrine (NEO-SYNEPHRINE) Adult infusion 80 mcg/min (06/07/20 0852)  . piperacillin-tazobactam (ZOSYN)  IV    . sodium bicarbonate in D5W 1000 mL infusion 125 mL/hr at 06/07/20 0852  . vasopressin 0.03 Units/min (06/07/20 0852)    Procedures/Studies: CT ABDOMEN PELVIS W CONTRAST  Result Date: 06/17/2020 CLINICAL DATA:  Abdominal distension, diarrhea X 10 days. Told by GI that if it got worse to go to the ED. EXAM: CT ABDOMEN AND PELVIS WITH CONTRAST TECHNIQUE: Multidetector CT imaging of the abdomen and pelvis was performed using the standard protocol following bolus administration of intravenous contrast. CONTRAST:  27m OMNIPAQUE IOHEXOL 300 MG/ML  SOLN COMPARISON:  MRI abdomen 01/21/2020, CT abdomen pelvis 01/05/2020 FINDINGS: Lower chest: No acute abnormality. Four-vessel coronary artery calcifications status post coronary artery bypass. Hepatobiliary: No focal liver abnormality. Several calcified gallstones are noted within the gallbladder  lumen. Otherwise no gallbladder wall thickening or pericholecystic fluid. No biliary dilatation. Pancreas: Redemonstration of a stable 1.5 x 1.2 pancreatic head/uncinate process lesion that was consistent with a in intraductal mucinous papillary neoplasm on MRI abdomen 01/21/2020. No other pancreatic lesion identified. Normal pancreatic contour. No surrounding  inflammatory changes. No main pancreatic ductal dilatation. Spleen: Normal in size. Associated calcified density likely represents sequelae of prior granulomatous disease. Suggestion of a splenule is again noted (2:28). Adrenals/Urinary Tract: No adrenal nodule bilaterally. Nonspecific bilateral perinephric stranding. Bilateral kidneys enhance symmetrically. No hydronephrosis. No hydroureter. The urinary bladder is unremarkable. Stomach/Bowel: Stomach is within normal limits. No evidence of small bowel wall thickening or dilatation. Extensive large bowel wall circumferential thickening and pericolonic fat stranding that is much more prominent of the rectosigmoid colon. No large bowel dilatation. No pneumatosis. Appendix appears normal. Vascular/Lymphatic: No abdominal aorta or iliac aneurysm. Focal dilatation of the infrarenal abdominal aorta with a caliber of 2.6 cm (5:55). Atherosclerotic plaque of the aorta and its branches. No abdominal, pelvic, or inguinal lymphadenopathy. Reproductive: Prostate resection. Surgical clips associated with the right testicle likely represents vasectomy. Other: Trace fluid within the abdomen and pelvis. No intraperitoneal free gas. No organized fluid collection. Musculoskeletal: Small fat and fluid containing right inguinal hernia. Possible tiny fat containing supraumbilical hernia (9:89). Diffusely decreased bone density. Posterolateral fusion of the L4 through S1 levels with inter pedicular screw and rod fixation as well as interbody cages. Multilevel severe degenerative changes of the spine with intervertebral vacuum  phenomenon. No suspicious lytic or blastic osseous lesions. No acute displaced fracture. Multilevel degenerative changes of the spine. IMPRESSION: 1. Marked pancolitis that is worse of the rectosigmoid colon. Etiology could be infectious, inflammatory, ischemic. No associated bowel obstruction or perforation. Correlate with stool cultures. 2. Other imaging findings of potential clinical significance: Cholelithiasis with no acute cholecystitis. 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 Small right inguinal hernia containing fat and fluid. Aortic Atherosclerosis (ICD10-I70.0). Electronically Signed   By: Iven Finn M.D.   On: 06/29/2020 22:57   DG Chest Port 1 View  Result Date: 06/06/2020 CLINICAL DATA:  Worsening weakness.  Fever. EXAM: PORTABLE CHEST 1 VIEW COMPARISON:  06/05/2020 FINDINGS: The right IJ catheter tip is in the SVC. ETT tip is stable above the carina. Nasogastric tube is identified with tip in the expected location of the gastric body. Stable cardiomediastinal contours. Aortic atherosclerotic calcifications. Bilateral scratch set diffuse left lung and right upper lobe pulmonary opacities mildly increased in the interval. No pneumothorax identified. IMPRESSION: 1. Worsening aeration to the lungs compared with previous exam. 2. Stable support apparatus. Electronically Signed   By: Kerby Moors M.D.   On: 06/06/2020 08:14   DG Chest Port 1 View  Result Date: 06/05/2020 CLINICAL DATA:  Post intubation EXAM: PORTABLE CHEST 1 VIEW COMPARISON:  06/17/2020 FINDINGS: Endotracheal tube is 4.6 cm above the carina. Patchy bilateral upper lobe airspace opacities. Heart is normal size. Prior CABG. No effusions or pneumothorax. No acute bony abnormality. IMPRESSION: Endotracheal tube 4.6 cm above the carina. Patchy bilateral upper lobe airspace opacities, new since prior study. Electronically Signed   By: Rolm Baptise M.D.   On: 06/05/2020 00:17   DG Chest Port 1  View  Result Date: 06/01/2020 CLINICAL DATA:  Code sepsis.  Fever, weakness, diarrhea EXAM: PORTABLE CHEST 1 VIEW COMPARISON:  Radiograph 02/04/2020.  CT 04/08/2016 FINDINGS: Post median sternotomy and CABG. Coronary stent also visualized. Heart is normal in size aortic atherosclerosis. Mild elevation of right hemidiaphragm. There are ill-defined streaky opacities at both lung bases. No pulmonary edema. No confluent consolidation. No pleural fluid or pneumothorax. The bones are under mineralized without acute osseous abnormalities. IMPRESSION: 1. Ill-defined streaky opacities at both lung bases, favor atelectasis over pneumonia. 2. Post CABG. Normal  heart size. Aortic Atherosclerosis (ICD10-I70.0). Electronically Signed   By: Keith Rake M.D.   On: 06/07/2020 20:31   DG Chest Port 1V same Day  Result Date: 06/05/2020 CLINICAL DATA:  OG tube in central line placement EXAM: PORTABLE CHEST 1 VIEW COMPARISON:  CT abdomen pelvis 06/03/2020, radiograph 06/05/2020 FINDINGS: Endotracheal tube tip terminates in the mid trachea, 4 cm from the carina. A transesophageal tube tip is seen coiling at the level of the GE junction possibly folded within the distal thoracic esophagus or within the gastric fundus. Could consider advancing at least 3 cm and reimaging. Right IJ catheter tip in the mid SVC. Additional support devices including pacer pads and telemetry leads overlie the chest. Prior sternotomy and CABG with vascular stenting. Cardiac silhouette is similar to prior portable supine radiography accounting for differences in lung inflation. Calcified aorta is again noted. Diffuse interstitial and patchy airspace opacities appear to be increasing within the upper lungs with some atelectatic changes elsewhere. No pneumothorax or visible effusion. No acute osseous or soft tissue abnormality. IMPRESSION: 1. Endotracheal tube tip terminates in the mid trachea, 4 cm from the carina. 2. A transesophageal tube tip is seen  coiling at the level of the GE junction possibly folded within the distal thoracic esophagus or within the gastric fundus. Could consider advancing at least 3 cm and reimaging. 3. Increasing upper lobe opacities with some likely atelectatic changes in the lung bases. 4.  Aortic Atherosclerosis (ICD10-I70.0). Electronically Signed   By: Lovena Le M.D.   On: 06/05/2020 01:37   ECHOCARDIOGRAM COMPLETE  Result Date: 06/05/2020    ECHOCARDIOGRAM REPORT   Patient Name:   Samuel Gregory Date of Exam: 06/05/2020 Medical Rec #:  [id number]    Height:       66.0 in Accession #:    [id number]   Weight:       169.3 lb Date of Birth:  08/27/1940    BSA:          1.863 m Patient Age:    79 years     BP:           80/56 mmHg Patient Gender: M            HR:           119 bpm. Exam Location:  [facility] Procedure: 2D Echo Indications:    Cardiac arrest I46.9  History:        Patient has prior history of Echocardiogram examinations, most                 recent 01/23/2020. CAD, Arrythmias:Atrial Flutter; Risk                 Factors:Hypertension and Dyslipidemia. Prostate Cancer, Vfib,                 Septic Shock.  Sonographer:    BW Referring Phys: [id number] [place]  1. Left ventricular ejection fraction, by estimation, is 40%. The left ventricle has normal function. The left ventricle demonstrates global hypokinesis. Left ventricular diastolic parameters are indeterminate.  2. Right ventricular systolic function was not well visualized. The right ventricular size is not well visualized.  3. The mitral valve is normal in structure. No evidence of mitral valve regurgitation. No evidence of mitral stenosis.  4. The aortic valve is tricuspid. Aortic valve regurgitation is not visualized. No aortic stenosis is present.  5. The inferior vena cava is normal in size with greater than  50% respiratory variability, suggesting right atrial pressure of 3 mmHg. FINDINGS  Left Ventricle: Left ventricular ejection fraction,  by estimation, is 40%. The left ventricle has normal function. The left ventricle demonstrates global hypokinesis. The left ventricular internal cavity size was normal in size. There is no left ventricular hypertrophy. Left ventricular diastolic parameters are indeterminate. Right Ventricle: RV poorly visualized. Grossly appears normal in size and function. The right ventricular size is not well visualized. Right vetricular wall thickness was not well visualized. Right ventricular systolic function was not well visualized. Left Atrium: Left atrial size was normal in size. Right Atrium: Right atrial size was not well visualized. Pericardium: There is no evidence of pericardial effusion. Mitral Valve: The mitral valve is normal in structure. No evidence of mitral valve regurgitation. No evidence of mitral valve stenosis. Tricuspid Valve: The tricuspid valve is not well visualized. Tricuspid valve regurgitation is mild . No evidence of tricuspid stenosis. Aortic Valve: The aortic valve is tricuspid. Aortic valve regurgitation is not visualized. No aortic stenosis is present. Aortic valve mean gradient measures 2.0 mmHg. Aortic valve peak gradient measures 3.6 mmHg. Aortic valve area, by VTI measures 3.03 cm. Pulmonic Valve: The pulmonic valve was not well visualized. Pulmonic valve regurgitation is not visualized. No evidence of pulmonic stenosis. Aorta: The aortic root is normal in size and structure. Pulmonary Artery: 21. Venous: The inferior vena cava is normal in size with greater than 50% respiratory variability, suggesting right atrial pressure of 3 mmHg. IAS/Shunts: No atrial level shunt detected by color flow Doppler.  LEFT VENTRICLE PLAX 2D LVIDd:         3.98 cm  Diastology LVIDs:         3.06 cm  LV e' medial:  8.92 cm/s LV PW:         1.20 cm  LV e' lateral: 8.16 cm/s LV IVS:        1.13 cm LVOT diam:     2.10 cm LV SV:         33 LV SV Index:   18 LVOT Area:     3.46 cm  RIGHT VENTRICLE RV S prime:      8.70 cm/s LEFT ATRIUM             Index       RIGHT ATRIUM           Index LA diam:        3.10 cm 1.66 cm/m  RA Area:     13.50 cm LA Vol (A2C):   37.3 ml 20.02 ml/m RA Volume:   34.20 ml  18.36 ml/m LA Vol (A4C):   24.6 ml 13.20 ml/m LA Biplane Vol: 30.3 ml 16.26 ml/m  AORTIC VALVE AV Area (Vmax):    3.15 cm AV Area (Vmean):   3.03 cm AV Area (VTI):     3.03 cm AV Vmax:           95.02 cm/s AV Vmean:          66.053 cm/s AV VTI:            0.110 m AV Peak Grad:      3.6 mmHg AV Mean Grad:      2.0 mmHg LVOT Vmax:         86.55 cm/s LVOT Vmean:        57.802 cm/s LVOT VTI:          0.096 m LVOT/AV VTI ratio: 0.88  AORTA Ao Root diam:  3.00 cm MITRAL VALVE               TRICUSPID VALVE MV Area (PHT): 7.59 cm    TR Peak grad:   21.0 mmHg MV Decel Time: 100 msec    TR Vmax:        229.00 cm/s MV A velocity: 59.10 cm/s                            SHUNTS                            Systemic VTI:  0.10 m                            Systemic Diam: 2.10 cm Carlyle Dolly MD Electronically signed by Carlyle Dolly MD Signature Date/Time: 06/05/2020/4:31:35 PM    Final     Orson Eva, DO  [facility]  If 7PM-7AM, please contact night-coverage [email or web address] Password [email or web address] 06/07/2020, 10:14 AM   LOS: 3 days

## 2020-06-07 NOTE — Procedures (Signed)
Arterial Catheter Insertion Procedure Note  Samuel Gregory  [id number]  1941-07-14  Date:06/07/20  Time:5:47 PM    Provider Performing: Kipp Brood    Procedure: Insertion of Arterial Line [phone]) with US guidance (88875)   Indication(s) Blood pressure monitoring and/or need for frequent ABGs  Consent Risks of the procedure as well as the alternatives and risks of each were explained to the patient and/or caregiver.  Consent for the procedure was obtained and is signed in the bedside chart  Anesthesia None   Time Out Verified patient identification, verified procedure, site/side was marked, verified correct patient position, special equipment/implants available, medications/allergies/relevant history reviewed, required imaging and test results available.   Sterile Technique Maximal sterile technique including full sterile barrier drape, hand hygiene, sterile gown, sterile gloves, mask, hair covering, sterile ultrasound probe cover (if used).   Procedure Description Area of catheter insertion was cleaned with chlorhexidine and draped in sterile fashion. With real-time ultrasound guidance an arterial catheter was placed into the left femoral artery.  Appropriate arterial tracings confirmed on monitor.     Complications/Tolerance None; patient tolerated the procedure well.   EBL Minimal   Kipp Brood, MD [facility] ICU Physician [place]  Pager: [phone] Mobile: [phone] After hours: [phone].  06/07/2020, 5:48 PM

## 2020-06-07 NOTE — Progress Notes (Signed)
CRRT orders written, keeping even, no a/c to start, all 4/2.5 fluids.   Kelly Splinter, MD 06/07/2020, 4:59 PM

## 2020-06-07 NOTE — Consult Note (Signed)
Nephrology Consult   Requesting provider: Orson Eva Service requesting consult: Hospitalist Reason for consult: AKI on CKD   Assessment/Recommendations: Samuel Gregory is a/an 79 y.o. male with a past medical history  CAD, atrial flutter, CKD, HTN, HLD, prostate cancer who present w/ C. difficile diarrhea, shock, cardiac arrest  Severe oliguric AKI on CKD3a: Likely secondary to ATN related to cardiac arrest and shock.  Does not appear dehydrated at this time. -Continue aggressive hemodynamic support per primary team -Lasix 160 mg; furosemide stress test, document urine output 2 hours after given for prognostication -Poor prognosis overall, family would like aggressive efforts at this time -No emergent indication for hemodialysis but will transfer to [facility] and likely will start CRRT in the next 1 to 2 days pending his progress -Continue to monitor daily Cr, Dose meds for GFR -Monitor Daily I/Os, Daily weight  -Maintain MAP>65 for optimal renal perfusion.  -Avoid nephrotoxic medications including NSAIDs and Vanc/Zosyn combo -Foley in place, no reason for ultrasound at this time -Obtaining urinalysis  Shock/C. difficile: Likely septic related to C. Difficile -Continue vancomycin oral solution and enema -On Zosyn and midodrine as well -Pressors per primary team -Blood pressure slightly improved, will have to monitor closely on whether he will tolerate CRRT  Non-anion gap metabolic acidosis: Likely associated with diarrhea.  Bicarb was as low as 12.  On serum bicarb 125 cc/h.  Will decrease to 75 cc/h given serum bicarbonate is 24 today  Hyponatremia/hypochloremia: Likely related to free water retention in setting of AKI as well as isotonic fluid loss from diarrhea.  Sodium 129 and chloride 94.  Could consider normal saline if chloride and sodium continue to fall.  Continue to monitor  Hypoalbuminemia: Associated with severe illness.  25 g of 25% albumin today.  Can redose if  needed  Hypocalcemia: Measured at 6.1 today.  Corrects to 8.1 when accounting for albumin.  No need for IV calcium at this time.  Can redose if needed    Recommendations conveyed to primary service.    [facility] 06/07/2020 10:51 AM   _____________________________________________________________________________________ CC: AKI on CKD 3A  History of Present Illness: JORIAN WILLHOITE is a/an 79 y.o. male with a past medical history of CAD, atrial flutter, CKD, HTN, HLD, prostate cancer who presents with weakness and diarrhea on 11/4.  Patient was intubated and sedated so history was obtained per chart review and daughter at bedside  Patient had developed acute diarrhea bringing him to the hospital.  He has a history of C. difficile infection treated with vancomycin in the past.  His diarrhea had been going on for about 10 days.  He had not recently received antibiotics.  On arrival to the emergency department he was noted to be tachycardic and febrile.  He was not hypotensive at that time.  However, while he was in the emergency room he went into ventricular fibrillation/cardiac arrest requiring CPR.  The patient was intubated and ROSC was achieved.  He was started on amiodarone and had progressive hypotension requiring pressor support.  Patient was found to be C. difficile positive and began treatment for this.  He has been noted to have a white blood cell count as well as rising creatinine.  His pressor requirement has been extensive requiring dobutamine, norepinephrine, phenylephrine, vasopressin.  His urine output has been minimal.  Goals of care conversations have been going on with the daughter and family.  The daughter is a Marine scientist.  She states he  was very functional at baseline.  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  For now they would like to continue aggressive care.   Medications:  Current Facility-Administered Medications   Medication Dose Route Frequency Provider Last Rate Last Admin  . 0.9 %  sodium chloride infusion  250 mL Intravenous Continuous Adefeso, Oladapo, DO      . albumin human 25 % solution 25 g  25 g Intravenous Q12H Reesa Chew, MD 60 mL/hr at 06/07/20 0852 Rate Verify at 06/07/20 0852  . amiodarone (NEXTERONE PREMIX) 360-4.14 MG/200ML-% (1.8 mg/mL) IV infusion  30 mg/hr Intravenous Continuous Anders Simmonds, MD 16.67 mL/hr at 06/07/20 0852 30 mg/hr at 06/07/20 0852  . aspirin suppository 150 mg  150 mg Rectal Daily Tat, David, MD   150 mg at 06/06/20 1213  . chlorhexidine gluconate (MEDLINE KIT) (PERIDEX) 0.12 % solution 15 mL  15 mL Mouth Rinse BID Tat, David, MD   15 mL at 06/07/20 0742  . Chlorhexidine Gluconate Cloth 2 % PADS 6 each  6 each Topical Daily Tat, David, MD   6 each at 06/07/20 0743  . DOBUTamine (DOBUTREX) infusion 4000 mcg/mL  2 mcg/kg/min Intravenous Titrated Arnoldo Lenis, MD 2.3 mL/hr at 06/07/20 0852 2 mcg/kg/min at 06/07/20 0852  . fentaNYL 2593mg in NS 256m(1050mml) infusion-PREMIX  0-400 mcg/hr Intravenous Continuous Adefeso, Oladapo, DO 15 mL/hr at 06/07/20 0852 150 mcg/hr at 06/07/20 0852  . heparin injection 5,000 Units  5,000 Units Subcutaneous Q8H Adefeso, Oladapo, DO   5,000 Units at 06/07/20 0500  . hydrocortisone sodium succinate (SOLU-CORTEF) 100 MG injection 50 mg  50 mg Intravenous Q6H SomAnders SimmondsD   50 mg at 06/07/20 0742  . insulin aspart (novoLOG) injection 0-9 Units  0-9 Units Subcutaneous Q4HMelanee SpryD   3 Units at 06/07/20 [id number] MEDLINE mouth rinse  15 mL Mouth Rinse 10 times per day TatOrson EvaD   15 mL at 06/07/20 0853  . metroNIDAZOLE (FLAGYL) IVPB 500 mg  500 mg Intravenous Q8H GilCarlis StableP   Stopped at 06/07/20 0848  . midazolam (VERSED) injection 2-4 mg  2-4 mg Intravenous Q1H PRN WerTanda RockersD   2 mg at 06/07/20 0418  . norepinephrine (LEVOPHED) 16 mg in 250m49memix infusion  0-40 mcg/min Intravenous Titrated  Tat,Orson Eva 17.81 mL/hr at 06/07/20 0852 19 mcg/min at 06/07/20 0852  . pantoprazole (PROTONIX) injection 40 mg  40 mg Intravenous Q24H Adefeso, Oladapo, DO   40 mg at 06/07/20 0741  . phenylephrine CONCENTRATED 100mg82msodium chloride 0.9% 250mL 82mmg/mL61mnfusion  0-400 mcg/min Intravenous Titrated Tat, DaOrson Eva mL/hr at 06/07/20 0852 80 mcg/min at 06/07/20 0852  . piperacillin-tazobactam (ZOSYN) IVPB 3.375 g  3.375 g Intravenous Q12H Tat, David, MD      . sodium bicarbonate 100 mEq in dextrose 5 % 1,000 mL infusion   Intravenous Continuous Wert, MTanda Rockers5 mL/hr at 06/07/20 0852 Rate Verify at 06/07/20 0852  . sodium chloride flush (NS) 0.9 % injection 10-40 mL  10-40 mL Intracatheter Q12H JenkinsAviva Signs10 mL at 06/07/20 0851  . sodium chloride flush (NS) 0.9 % injection 10-40 mL  10-40 mL Intracatheter PRN JenkinsAviva Signs   . vancomycin (VANCOCIN) 50 mg/mL oral solution 500 mg  500 mg Per Tube Q6H Gill, Eric A, NP   500 mg at 06/07/20 0500  .  vancomycin (VANCOCIN) 500 mg in sodium chloride irrigation 0.9 % 100 mL ENEMA  500 mg Rectal Q6H Montez Morita, Quillian Quince, MD   500 mg at 06/07/20 0510  . vasopressin (PITRESSIN) 20 Units in sodium chloride 0.9 % 100 mL infusion-*FOR SHOCK*  0-0.03 Units/min Intravenous Continuous Anders Simmonds, MD 9 mL/hr at 06/07/20 0852 0.03 Units/min at 06/07/20 1950     ALLERGIES Latex and Sulfa antibiotics  MEDICAL HISTORY Past Medical History:  Diagnosis Date  . Arthritis   . Atrial flutter ([place])    a. diagnosed in 12/2019 while admitted for sepsis  . Collagen vascular disease ([place])   . Coronary atherosclerosis of native coronary artery    a. s/p CABG in 2000 b. NST in 10/2017 showing mild to moderate per-infarct ischemia with medical management recommended  . Essential hypertension   . Hyperlipidemia   . Prostate cancer [facility])      SOCIAL HISTORY Social History   Socioeconomic History  . Marital status: Married     Spouse name: Not on file  . Number of children: Not on file  . Years of education: Not on file  . Highest education level: Not on file  Occupational History  . Not on file  Tobacco Use  . Smoking status: Never Smoker  . Smokeless tobacco: Never Used  Vaping Use  . Vaping Use: Never used  Substance and Sexual Activity  . Alcohol use: Yes    Alcohol/week: 0.0 standard drinks    Comment: daily-glass of wine  . Drug use: No  . Sexual activity: Not on file  Other Topics Concern  . Not on file  Social History Narrative  . Not on file   Social Determinants of Health   Financial Resource Strain:   . Difficulty of Paying Living Expenses: Not on file  Food Insecurity:   . Worried About Charity fundraiser in the Last Year: Not on file  . Ran Out of Food in the Last Year: Not on file  Transportation Needs:   . Lack of Transportation (Medical): Not on file  . Lack of Transportation (Non-Medical): Not on file  Physical Activity:   . Days of Exercise per Week: Not on file  . Minutes of Exercise per Session: Not on file  Stress:   . Feeling of Stress : Not on file  Social Connections:   . Frequency of Communication with Friends and Family: Not on file  . Frequency of Social Gatherings with Friends and Family: Not on file  . Attends Religious Services: Not on file  . Active Member of Clubs or Organizations: Not on file  . Attends Archivist Meetings: Not on file  . Marital Status: Not on file  Intimate Partner Violence:   . Fear of Current or Ex-Partner: Not on file  . Emotionally Abused: Not on file  . Physically Abused: Not on file  . Sexually Abused: Not on file     FAMILY HISTORY Family History  Problem Relation Age of Onset  . Heart attack Mother   . Cancer Mother   . Aneurysm Father       Review of Systems: Unable to obtain review of systems due to the patient's sedation  Physical Exam: Vitals:   06/07/20 0817 06/07/20 0900  BP:  (!) 145/23  Pulse:   (!) 110  Resp:    Temp:    SpO2: 99% 98%   Total I/O In: 1756.7 [I.V.:1113.2; IV Piggyback:643.4] Out: -   Intake/Output Summary (Last  24 hours) at 06/07/2020 1051 Last data filed at 06/07/2020 7106 Gross per 24 hour  Intake 6814.21 ml  Output 1450 ml  Net 5364.21 ml   General: Ill-appearing, sedated HEENT: anicteric sclera, oropharynx clear without lesions, ET tube in place CV: Tachycardia, 1+ pitting edema in the bilateral lower extremities, no audible murmur Lungs: Coarse bilateral breath sounds, ventilated, bilateral chest rise Abd: soft, non-tender, mild distention Skin: no visible lesions or rashes Psych: Sedated not interactive Musculoskeletal: no obvious deformities Neuro: Sedated, no purposeful movement  Test Results Reviewed Lab Results  Component Value Date   NA 129 (L) 06/07/2020   K 4.6 06/07/2020   CL 94 (L) 06/07/2020   CO2 24 06/07/2020   BUN 27 (H) 06/07/2020   CREATININE 3.63 (H) 06/07/2020   CALCIUM 6.1 (LL) 06/07/2020   ALBUMIN 1.5 (L) 06/07/2020   PHOS 2.9 06/06/2020     I have reviewed all relevant outside healthcare records related to the patient's current hospitalization

## 2020-06-07 NOTE — Procedures (Signed)
Central Venous Catheter Insertion Procedure Note  Samuel Gregory  [id number]  04-26-1941  Date:06/07/20  Time:5:46 PM   Provider Performing:Michaeljoseph Revolorio   Procedure: Insertion of Non-tunneled Central Venous Catheter(36556)with US guidance (60737)    Indication(s) Hemodialysis  Consent Risks of the procedure as well as the alternatives and risks of each were explained to the patient and/or caregiver.  Consent for the procedure was obtained and is signed in the bedside chart  Anesthesia Topical only with 1% lidocaine   Timeout Verified patient identification, verified procedure, site/side was marked, verified correct patient position, special equipment/implants available, medications/allergies/relevant history reviewed, required imaging and test results available.  Sterile Technique Maximal sterile technique including full sterile barrier drape, hand hygiene, sterile gown, sterile gloves, mask, hair covering, sterile ultrasound probe cover (if used).  Procedure Description Area of catheter insertion was cleaned with chlorhexidine and draped in sterile fashion.   With real-time ultrasound guidance a HD catheter was placed into the left femoral vein.  Nonpulsatile blood flow and easy flushing noted in all ports.  The catheter was sutured in place and sterile dressing applied.  Complications/Tolerance None; patient tolerated the procedure well. Chest X-ray is ordered to verify placement for internal jugular or subclavian cannulation.  Chest x-ray is not ordered for femoral cannulation.  EBL Minimal  Kipp Brood, MD [facility] ICU Physician [place]  Pager: [phone] Mobile: [phone] After hours: [phone].  06/07/2020, 5:46 PM

## 2020-06-08 DIAGNOSIS — K529 Noninfective gastroenteritis and colitis, unspecified: Secondary | ICD-10-CM | POA: Diagnosis not present

## 2020-06-08 DIAGNOSIS — J9601 Acute respiratory failure with hypoxia: Secondary | ICD-10-CM | POA: Diagnosis not present

## 2020-06-08 DIAGNOSIS — R0600 Dyspnea, unspecified: Secondary | ICD-10-CM

## 2020-06-08 DIAGNOSIS — A0472 Enterocolitis due to Clostridium difficile, not specified as recurrent: Secondary | ICD-10-CM | POA: Diagnosis not present

## 2020-06-08 DIAGNOSIS — A419 Sepsis, unspecified organism: Secondary | ICD-10-CM | POA: Diagnosis not present

## 2020-06-08 LAB — RENAL FUNCTION PANEL
Albumin: 1.6 g/dL — ABNORMAL LOW (ref 3.5–5.0)
Albumin: 1.7 g/dL — ABNORMAL LOW (ref 3.5–5.0)
Anion gap: 12 (ref 5–15)
Anion gap: 13 (ref 5–15)
BUN: 22 mg/dL (ref 8–23)
BUN: 21 mg/dL (ref 8–23)
CO2: 20 mmol/L — ABNORMAL LOW (ref 22–32)
CO2: 20 mmol/L — ABNORMAL LOW (ref 22–32)
Calcium: 6.5 mg/dL — ABNORMAL LOW (ref 8.9–10.3)
Calcium: 6.8 mg/dL — ABNORMAL LOW (ref 8.9–10.3)
Chloride: 100 mmol/L (ref 98–111)
Chloride: 99 mmol/L (ref 98–111)
Creatinine, Ser: 2.8 mg/dL — ABNORMAL HIGH (ref 0.61–1.24)
Creatinine, Ser: 2.25 mg/dL — ABNORMAL HIGH (ref 0.61–1.24)
GFR, Estimated: 22 mL/min — ABNORMAL LOW (ref >60)
GFR, Estimated: 29 mL/min — ABNORMAL LOW (ref >60)
Glucose, Bld: 117 mg/dL — ABNORMAL HIGH (ref 70–99)
Glucose, Bld: 176 mg/dL — ABNORMAL HIGH (ref 70–99)
Phosphorus: 4.2 mg/dL (ref 2.5–4.6)
Phosphorus: 2.8 mg/dL (ref 2.5–4.6)
Potassium: 4.9 mmol/L (ref 3.5–5.1)
Potassium: 4.4 mmol/L (ref 3.5–5.1)
Sodium: 132 mmol/L — ABNORMAL LOW (ref 135–145)
Sodium: 132 mmol/L — ABNORMAL LOW (ref 135–145)

## 2020-06-08 LAB — APTT
aPTT: 50 seconds — ABNORMAL HIGH (ref 24–36)
aPTT: >200 seconds (ref 24–36)

## 2020-06-08 LAB — MAGNESIUM
Magnesium: 2.5 mg/dL — ABNORMAL HIGH (ref 1.7–2.4)
Magnesium: 2.4 mg/dL (ref 1.7–2.4)

## 2020-06-08 LAB — HEPATIC FUNCTION PANEL
ALT: 78 U/L — ABNORMAL HIGH (ref 0–44)
AST: 131 U/L — ABNORMAL HIGH (ref 15–41)
Albumin: 1.7 g/dL — ABNORMAL LOW (ref 3.5–5.0)
Alkaline Phosphatase: 97 U/L (ref 38–126)
Bilirubin, Direct: 0.2 mg/dL (ref 0.0–0.2)
Indirect Bilirubin: 0.6 mg/dL (ref 0.3–0.9)
Total Bilirubin: 0.8 mg/dL (ref 0.3–1.2)
Total Protein: 4.2 g/dL — ABNORMAL LOW (ref 6.5–8.1)

## 2020-06-08 LAB — HEPARIN LEVEL (UNFRACTIONATED): Heparin Unfractionated: 1.1 IU/mL — ABNORMAL HIGH (ref 0.30–0.70)

## 2020-06-08 LAB — CBC
HCT: 28.5 % — ABNORMAL LOW (ref 39.0–52.0)
Hemoglobin: 9.3 g/dL — ABNORMAL LOW (ref 13.0–17.0)
MCH: 31.1 pg (ref 26.0–34.0)
MCHC: 32.6 g/dL (ref 30.0–36.0)
MCV: 95.3 fL (ref 80.0–100.0)
Platelets: 205 10*3/uL (ref 150–400)
RBC: 2.99 MIL/uL — ABNORMAL LOW (ref 4.22–5.81)
RDW: 14.4 % (ref 11.5–15.5)
WBC: 15.6 10*3/uL — ABNORMAL HIGH (ref 4.0–10.5)
nRBC: 0.3 % — ABNORMAL HIGH (ref 0.0–0.2)

## 2020-06-08 LAB — PROTIME-INR
INR: 1.3 — ABNORMAL HIGH (ref 0.8–1.2)
Prothrombin Time: 15.3 seconds — ABNORMAL HIGH (ref 11.4–15.2)

## 2020-06-08 LAB — GLUCOSE, CAPILLARY
Glucose-Capillary: 97 mg/dL (ref 70–99)
Glucose-Capillary: 113 mg/dL — ABNORMAL HIGH (ref 70–99)
Glucose-Capillary: 171 mg/dL — ABNORMAL HIGH (ref 70–99)

## 2020-06-08 MED ORDER — HEPARIN (PORCINE) 25000 UT/250ML-% IV SOLN
1200.0000 [IU]/h | INTRAVENOUS | Status: DC
  Administered 2020-06-08: 1200 [IU]/h via INTRAVENOUS
  Filled 2020-06-08: qty 250

## 2020-06-08 MED ORDER — HEPARIN (PORCINE) 25000 UT/250ML-% IV SOLN: 950.0000 [IU]/h | INTRAVENOUS | Status: DC

## 2020-06-08 MED ORDER — B COMPLEX-C PO TABS
1.0000 | ORAL_TABLET | Freq: Every day | ORAL | Status: DC
  Administered 2020-06-08 – 2020-06-14 (×7): 1
  Filled 2020-06-08 (×7): qty 1

## 2020-06-08 MED ORDER — PROSOURCE TF PO LIQD
45.0000 mL | Freq: Three times a day (TID) | ORAL | Status: DC
  Administered 2020-06-08 – 2020-06-14 (×18): 45 mL
  Filled 2020-06-08 (×18): qty 45

## 2020-06-08 MED ORDER — VITAL AF 1.2 CAL PO LIQD
1000.0000 mL | ORAL | Status: DC
  Administered 2020-06-08 – 2020-06-11 (×4): 1000 mL
  Filled 2020-06-08 (×3): qty 1000

## 2020-06-08 NOTE — Progress Notes (Signed)
[facility] NEPHROLOGY PROGRESS NOTE  Assessment/ Plan: Pt is a 79 y.o. yo male with CAD, atrial flutter, CKD, HTN, HLD, prostate cancer who present w/ C. difficile diarrhea, shock, cardiac arrest.  #Oliguric AKI on CKD stage III due to ATN after cardiac arrest/shock: Did not respond with IV diuretics and hemodynamic support.  Started CRRT on 11/7.  He has anasarca and hypotension.  Currently on vasopressin.  We will try ultrafiltration 100-150 cc an hour if blood pressure allows, may need to add Levophed.  Discussed with ICU nurse.  Continue current CRRT prescription, all 4K. Check urinalysis.  CT scan without hydronephrosis. Monitor urine output and labs.  #Acute hypoxic, hypercapnic respiratory failure requiring mechanical ventilation: PCCM is following.  #Septic shock due to C. difficile colitis: Antibiotics per primary team.  On pressors.  Maintaining MAP more than 65.  #History of metastatic prostate cancer with bone metastasis and stable pancreatic mass.  # Non-anion gap metabolic acidosis associated with diarrhea.  Bicarb was as low as 12.    Improved now.  #Hyponatremia/hypochloremia: Likely related to free water retention in setting of AKI as well as isotonic fluid loss from diarrhea.  Now managed with CRRT.  # Hypocalcemia:  Received IV calcium.  Corrected calcium level with albumin is ok.  Subjective: Seen and examined ICU.  Patient is intubated, on pressures and CRRT running.  Unable to obtain review of system.  No urine output with IV Lasix. Objective Vital signs in last 24 hours: Vitals:   06/08/20 0645 06/08/20 0700 06/08/20 0745 06/08/20 0800  BP:      Pulse: 77 76 72 73  Resp: (!) 24 (!) 26 (!) 25 (!) 25  Temp: (!) 95 F (35 C) (!) 95.2 F (35.1 C) (!) 95.4 F (35.2 C) (!) 95.4 F (35.2 C)  TempSrc:      SpO2: 100% 100% 100% 100%  Weight:      Height:       Weight change: 0 kg  Intake/Output Summary (Last 24 hours) at 06/08/2020 0820 Last  data filed at 06/08/2020 0800 Gross per 24 hour  Intake 3124 ml  Output 2046 ml  Net 1078 ml       Labs: Basic Metabolic Panel: Recent Labs  Lab 06/06/20 0407 06/06/20 0407 06/06/20 1400 06/06/20 1400 06/07/20 0448 06/07/20 1545 06/07/20 2210 06/08/20 0230  NA 135   < > 130*   < > 129*  --  131* 132*  K 2.5*   < > 4.6   < > 4.6  --  4.8 4.9  CL 116*   < > 101  --  94*  --   --  100  CO2 12*   < > 20*  --  24  --   --  20*  GLUCOSE 221*   < > 261*  --  233*  --   --  117*  BUN 17   < > 26*  --  27*  --   --  22  CREATININE 1.93*   < > 3.30*  --  3.63*  --   --  2.80*  CALCIUM 4.0*   < > 6.1*  --  6.1*  --   --  6.5*  PHOS 2.9  --   --   --   --  4.3  --  4.2   < > = values in this interval not displayed.   Liver Function Tests: Recent Labs  Lab 06/06/20 0407 06/07/20 0448 06/08/20 0230  AST 48* 75* 131*  ALT 25 48* 78*  ALKPHOS 35* 69 97  BILITOT 0.3 0.7 0.8  PROT <3.0* 3.9* 4.2*  ALBUMIN 1.1* 1.5* 1.6*  1.7*   No results for input(s): LIPASE, AMYLASE in the last 168 hours. No results for input(s): AMMONIA in the last 168 hours. CBC: Recent Labs  Lab 06/18/2020 2016 06/24/2020 2016 06/06/20 0407 06/06/20 0407 06/06/20 1400 06/06/20 1400 06/07/20 0448 06/07/20 2210 06/08/20 0230  WBC 13.1*   < > 13.0*   < > 16.1*  --  17.0*  --  15.6*  NEUTROABS 9.3*  --   --   --   --   --   --   --   --   HGB 13.2   < > 8.5*   < > 10.3*   < > 10.2* 8.8* 9.3*  HCT 40.9   < > 26.6*   < > 31.7*   < > 31.9* 26.0* 28.5*  MCV 99.5  --  102.3*  --  96.9  --  98.5  --  95.3  PLT 314   < > 189   < > 223  --  249  --  205   < > = values in this interval not displayed.   Cardiac Enzymes: No results for input(s): CKTOTAL, CKMB, CKMBINDEX, TROPONINI in the last 168 hours. CBG: Recent Labs  Lab 06/07/20 0712 06/07/20 1124 06/07/20 1603 06/07/20 2006 06/07/20 2347  GLUCAP 243* 182* 156* 111* 115*    Iron Studies: No results for input(s): IRON, TIBC, TRANSFERRIN, FERRITIN  in the last 72 hours. Studies/Results: No results found.  Medications: Infusions: .  prismasol BGK 4/2.5 400 mL/hr at 06/08/20 0611  .  prismasol BGK 4/2.5 200 mL/hr at 06/07/20 1802  . sodium chloride Stopped (06/08/20 0206)  . amiodarone 30 mg/hr (06/08/20 0800)  . dextrose Stopped (06/07/20 2017)  . fentaNYL infusion INTRAVENOUS 200 mcg/hr (06/08/20 0800)  . metronidazole Stopped (06/08/20 0202)  . norepinephrine (LEVOPHED) Adult infusion 1 mcg/min (06/08/20 0800)  . phenylephrine (NEO-SYNEPHRINE) Adult infusion Stopped (06/07/20 2213)  . prismasol BGK 4/2.5 1,600 mL/hr at 06/08/20 0706  . vasopressin 0.03 Units/min (06/08/20 0800)    Scheduled Medications: . aspirin  162 mg Per Tube Daily  . chlorhexidine gluconate (MEDLINE KIT)  15 mL Mouth Rinse BID  . Chlorhexidine Gluconate Cloth  6 each Topical Daily  . feeding supplement (PROSource TF)  45 mL Per Tube BID  . feeding supplement (VITAL HIGH PROTEIN)  1,000 mL Per Tube Q24H  . heparin  5,000 Units Subcutaneous Q8H  . hydrocortisone sod succinate (SOLU-CORTEF) inj  50 mg Intravenous Q6H  . insulin aspart  1-3 Units Subcutaneous Q4H  . insulin aspart  3 Units Subcutaneous Q4H  . mouth rinse  15 mL Mouth Rinse 10 times per day  . pantoprazole (PROTONIX) IV  40 mg Intravenous Q24H  . sodium chloride flush  10-40 mL Intracatheter Q12H  . vancomycin  500 mg Per Tube Q6H  . vancomycin (VANCOCIN) rectal ENEMA  500 mg Rectal Q6H    have reviewed scheduled and prn medications.  Physical Exam: General: Intubated, sedated, Heart:RRR, s1s2 nl Lungs: Coarse breath sound bilateral Abdomen: Soft, nondistended Extremities: Has anasarca with pitting edema Dialysis Access: Groin catheter.   Reesa Chew  06/08/2020,8:20 AM  LOS: 4 days  Pager: [phone]

## 2020-06-08 NOTE — Consult Note (Addendum)
[place] for Heparin Indication: atrial fibrillation  Allergies  Allergen Reactions  . Latex Rash  . Sulfa Antibiotics Rash    Patient Measurements: Height: 5\' 6"  (167.6 cm) Weight: 85.9 kg (189 lb 6 oz) IBW/kg (Calculated) : 63.8 Heparin Dosing Weight: 82.7 kg  Vital Signs: Temp: 97.9 F (36.6 C) (11/08 1900) Temp Source: Esophageal (11/08 2000) BP: 121/64 (11/08 2034) Pulse Rate: 88 (11/08 2034)  Labs: Recent Labs    06/06/20 1400 06/06/20 1400 06/07/20 0448 06/07/20 0448 06/07/20 2210 06/08/20 0230 06/08/20 1609 06/08/20 2005  HGB 10.3*   < > 10.2*   < > 8.8* 9.3*  --   --   HCT 31.7*   < > 31.9*  --  26.0* 28.5*  --   --   PLT 223  --  249  --   --  205  --   --   APTT  --   --   --   --   --  50*  --  >200*  LABPROT  --   --   --   --   --  15.3*  --   --   INR  --   --   --   --   --  1.3*  --   --   HEPARINUNFRC  --   --   --   --   --   --   --  1.10*  CREATININE 3.30*   < > 3.63*  --   --  2.80* 2.25*  --    < > = values in this interval not displayed.    Estimated Creatinine Clearance: 27.3 mL/min (A) (by C-G formula based on SCr of 2.25 mg/dL (H)).   Medical History: Past Medical History:  Diagnosis Date  . Arthritis   . Atrial flutter ([place])    a. diagnosed in 12/2019 while admitted for sepsis  . Collagen vascular disease ([place])   . Coronary atherosclerosis of native coronary artery    a. s/p CABG in 2000 b. NST in 10/2017 showing mild to moderate per-infarct ischemia with medical management recommended  . Essential hypertension   . Hyperlipidemia   . Prostate cancer [facility])     Assessment: Pt is a 59 YOM with a history of Afib on Eliquis prior to admission with last dose unknown. Pt has been hospitalized since 11/4, is currently ventilated with intermittent afib on amiodarone and has been started on CRRT for an AKI. Patient has been on prophylactic SQH so will omit heparin bolus and start heparin gtt  today. Last aPTT 50 @0230  on 11/8. Will monitor aPTT and HL until they correlate given prior DOAC and renal dysfx. CBC stable and no bleeding noted.   11/8 PM update:  Heparin level is elevated No need for further aPTT monitoring No issues per RN-drawn appropriately   Goal of Therapy:  Heparin level 0.3-0.7 units/mL Monitor platelets by anticoagulation protocol: Yes   Plan:  Hold heparin x 1 hr Re-start heparin drip at 950 units/hr at 2330 Re-check heparin level at [place], PharmD, [place] Pharmacist Phone: [phone]

## 2020-06-08 NOTE — Progress Notes (Signed)
Cardiology Progress Note  Patient ID: Samuel Gregory MRN: [id number] DOB: 05-Jun-1941 Date of Encounter: 06/08/2020  Primary Cardiologist: Rozann Lesches, MD  Subjective   Chief Complaint: None.  HPI: Intubated.  Remains on pressors.  On CRRT.  Minimally interactive per staff.  Intermittent atrial fibrillation on monitor.  ROS:  All other ROS reviewed and negative. Pertinent positives noted in the HPI.     Inpatient Medications  Scheduled Meds: . aspirin  162 mg Per Tube Daily  . chlorhexidine gluconate (MEDLINE KIT)  15 mL Mouth Rinse BID  . Chlorhexidine Gluconate Cloth  6 each Topical Daily  . feeding supplement (PROSource TF)  45 mL Per Tube BID  . feeding supplement (VITAL HIGH PROTEIN)  1,000 mL Per Tube Q24H  . heparin  5,000 Units Subcutaneous Q8H  . hydrocortisone sod succinate (SOLU-CORTEF) inj  50 mg Intravenous Q6H  . insulin aspart  1-3 Units Subcutaneous Q4H  . insulin aspart  3 Units Subcutaneous Q4H  . mouth rinse  15 mL Mouth Rinse 10 times per day  . pantoprazole (PROTONIX) IV  40 mg Intravenous Q24H  . sodium chloride flush  10-40 mL Intracatheter Q12H  . vancomycin  500 mg Per Tube Q6H  . vancomycin (VANCOCIN) rectal ENEMA  500 mg Rectal Q6H   Continuous Infusions: .  prismasol BGK 4/2.5 400 mL/hr at 06/08/20 0611  .  prismasol BGK 4/2.5 200 mL/hr at 06/07/20 1802  . sodium chloride Stopped (06/08/20 0206)  . amiodarone 30 mg/hr (06/08/20 0900)  . dextrose Stopped (06/07/20 2017)  . fentaNYL infusion INTRAVENOUS 200 mcg/hr (06/08/20 0900)  . metronidazole 500 mg (06/08/20 0901)  . norepinephrine (LEVOPHED) Adult infusion 5 mcg/min (06/08/20 0908)  . phenylephrine (NEO-SYNEPHRINE) Adult infusion Stopped (06/07/20 2213)  . prismasol BGK 4/2.5 1,600 mL/hr at 06/08/20 0706  . vasopressin 0.03 Units/min (06/08/20 0900)   PRN Meds: alteplase, heparin, midazolam, sodium chloride, sodium chloride flush   Vital Signs   Vitals:   06/08/20 0700 06/08/20 0745  06/08/20 0800 06/08/20 0833  BP:      Pulse: 76 72 73   Resp: (!) 26 (!) 25 (!) 25   Temp: (!) 95.2 F (35.1 C) (!) 95.4 F (35.2 C) (!) 95.4 F (35.2 C)   TempSrc:      SpO2: 100% 100% 100% 100%  Weight:      Height:        Intake/Output Summary (Last 24 hours) at 06/08/2020 0914 Last data filed at 06/08/2020 0900 Gross per 24 hour  Intake 3009.24 ml  Output 2046 ml  Net 963.24 ml   Last 3 Weights 06/08/2020 06/07/2020 06/07/2020  Weight (lbs) 189 lb 6 oz 197 lb 5 oz 197 lb 5 oz  Weight (kg) 85.9 kg 89.5 kg 89.5 kg      Telemetry  Overnight telemetry shows normal sinus rhythm with intermittent atrial fibrillation, which I personally reviewed.   ECG  The most recent ECG shows sinus tachycardia, heart rate 118, low voltage, inferior infarct noted, which I personally reviewed.   Physical Exam   Vitals:   06/08/20 0700 06/08/20 0745 06/08/20 0800 06/08/20 0833  BP:      Pulse: 76 72 73   Resp: (!) 26 (!) 25 (!) 25   Temp: (!) 95.2 F (35.1 C) (!) 95.4 F (35.2 C) (!) 95.4 F (35.2 C)   TempSrc:      SpO2: 100% 100% 100% 100%  Weight:      Height:  Intake/Output Summary (Last 24 hours) at 06/08/2020 0914 Last data filed at 06/08/2020 0900 Gross per 24 hour  Intake 3009.24 ml  Output 2046 ml  Net 963.24 ml    Last 3 Weights 06/08/2020 06/07/2020 06/07/2020  Weight (lbs) 189 lb 6 oz 197 lb 5 oz 197 lb 5 oz  Weight (kg) 85.9 kg 89.5 kg 89.5 kg    Body mass index is 30.57 kg/m.  General: Well nourished, well developed, in no acute distress Head: Atraumatic, normal size  Eyes: PEERLA, EOMI  Neck: Supple, no JVD Endocrine: No thryomegaly Cardiac: Normal S1, S2; RRR; no murmurs, rubs, or gallops Lungs: Clear to auscultation bilaterally, no wheezing, rhonchi or rales  Abd: Soft, nontender, no hepatomegaly  Ext: No edema, pulses 2+ Musculoskeletal: No deformities, BUE and BLE strength normal and equal Skin: Warm and dry, no rashes   Neuro: Alert and oriented to  person, place, time, and situation, CNII-XII grossly intact, no focal deficits  Psych: Normal mood and affect   Labs  High Sensitivity Troponin:   Recent Labs  Lab 06/08/2020 2016 06/05/20 0234 06/05/20 1300  TROPONINIHS 12 818* 5,229*     Cardiac EnzymesNo results for input(s): TROPONINI in the last 168 hours. No results for input(s): TROPIPOC in the last 168 hours.  Chemistry Recent Labs  Lab 06/06/20 0407 06/06/20 0407 06/06/20 1400 06/06/20 1400 06/07/20 0448 06/07/20 2210 06/08/20 0230  NA 135   < > 130*   < > 129* 131* 132*  K 2.5*   < > 4.6   < > 4.6 4.8 4.9  CL 116*   < > 101  --  94*  --  100  CO2 12*   < > 20*  --  24  --  20*  GLUCOSE 221*   < > 261*  --  233*  --  117*  BUN 17   < > 26*  --  27*  --  22  CREATININE 1.93*   < > 3.30*  --  3.63*  --  2.80*  CALCIUM 4.0*   < > 6.1*  --  6.1*  --  6.5*  PROT <3.0*  --   --   --  3.9*  --  4.2*  ALBUMIN 1.1*  --   --   --  1.5*  --  1.6*  1.7*  AST 48*  --   --   --  75*  --  131*  ALT 25  --   --   --  48*  --  78*  ALKPHOS 35*  --   --   --  69  --  97  BILITOT 0.3  --   --   --  0.7  --  0.8  GFRNONAA 35*   < > 18*  --  16*  --  22*  ANIONGAP 7   < > 9  --  11  --  12   < > = values in this interval not displayed.    Hematology Recent Labs  Lab 06/06/20 1400 06/06/20 1400 06/07/20 0448 06/07/20 2210 06/08/20 0230  WBC 16.1*  --  17.0*  --  15.6*  RBC 3.27*  --  3.24*  --  2.99*  HGB 10.3*   < > 10.2* 8.8* 9.3*  HCT 31.7*   < > 31.9* 26.0* 28.5*  MCV 96.9  --  98.5  --  95.3  MCH 31.5  --  31.5  --  31.1  MCHC 32.5  --  32.0  --  32.6  RDW 14.6  --  14.5  --  14.4  PLT 223  --  249  --  205   < > = values in this interval not displayed.   BNPNo results for input(s): BNP, PROBNP in the last 168 hours.  DDimer No results for input(s): DDIMER in the last 168 hours.   Radiology  No results found.  Cardiac Studies  TTE 06/05/2020  1. Left ventricular ejection fraction, by estimation, is 40%. The  left  ventricle has normal function. The left ventricle demonstrates global  hypokinesis. Left ventricular diastolic parameters are indeterminate.  2. Right ventricular systolic function was not well visualized. The right  ventricular size is not well visualized.  3. The mitral valve is normal in structure. No evidence of mitral valve  regurgitation. No evidence of mitral stenosis.  4. The aortic valve is tricuspid. Aortic valve regurgitation is not  visualized. No aortic stenosis is present.  5. The inferior vena cava is normal in size with greater than 50%  respiratory variability, suggesting right atrial pressure of 3 mmHg.  Patient Profile  Samuel Gregory is a 79 y.o. male with CAD status post CABG, paroxysmal atrial flutter, metastatic prostate cancer, hypertension who was admitted to [facility] on 06/22/2020.  He was found to have pancolitis and C. difficile.  His course was complicated on 55/01/3219 with VF arrest.  Assessment & Plan   1.  VF arrest -Admitted to [facility] on 06/20/2020 with abdominal pain.  Found to have severe pancolitis.  Now with C. difficile.  He is in septic shock with AKI requiring CRRT. -CPR was initiated.  He did undergo defibrillation x1.  He is remained on amiodarone with ROSC. -He was intubated during his course as well. -There is mention of peri-infarct ischemia on a recent stress test from 2018. -EF is now 40%. -Overall, I highly suspect that his VF arrest was driven by sepsis and acidosis.  Troponin was elevated.  This not surprising in setting of CPR known CAD.  There was mention of a low coox at any pen (off femoral line).  I would continue with Levophed and vasopressin for now.  These are the pressures of choice in the setting of septic shock.  Undoubtedly there is a mixed picture given his EF reduction of 40% however this likely will improve with treatment of his sepsis. -We will continue his amiodarone for now. -He was purposeful  after his arrest per [facility] reports, and he did not undergo hypothermia protocol. -Depending how he does with improvement would consider cardiac catheterization after he is out of the ICU.  -For now, would continue amiodarone drip.  Continue supportive measures including CRRT as well as pressor support.  2. Atrial fibrillation -Telemetry with intermittent atrial fibrillation.  Controlled on amiodarone. -We will continue this for now. -We will consider heparin drip for stroke prophylaxis.  This can be determined critical care medicine.  He is critically ill in ICU and I do not want to expose him to any unnecessary bleeding risk.    CRITICAL CARE Performed by: [place] Bells T O'Neal  Total critical care time: 35 minutes. Critical care time was exclusive of separately billable procedures and treating other patients. Critical care was necessary to treat or prevent imminent or life-threatening deterioration. Critical care was time spent personally by me on the following activities: development of treatment plan with patient and/or surrogate as well as nursing, discussions with consultants, evaluation of patient's  response to treatment, examination of patient, obtaining history from patient or surrogate, ordering and performing treatments and interventions, ordering and review of laboratory studies, ordering and review of radiographic studies, pulse oximetry and re-evaluation of patient's condition.  Signed, Addison Naegeli. Audie Box, [place]  06/08/2020 9:22 AM   For questions or updates, please contact [facility] Please consult [email or web address] for contact info under

## 2020-06-08 NOTE — Consult Note (Signed)
ANTICOAGULATION CONSULT NOTE - Initial Consult  Pharmacy Consult for Heparin Indication: atrial fibrillation  Allergies  Allergen Reactions   Latex Rash   Sulfa Antibiotics Rash    Patient Measurements: Height: 5\' 6"  (167.6 cm) Weight: 85.9 kg (189 lb 6 oz) IBW/kg (Calculated) : 63.8 Heparin Dosing Weight: 82.7 kg  Vital Signs: Temp: 96.8 F (36 C) (11/08 1100) Temp Source: Esophageal (11/08 0400) BP: 104/65 (11/08 0419) Pulse Rate: 83 (11/08 1100)  Labs: Recent Labs    06/05/20 1300 06/06/20 0407 06/06/20 1400 06/06/20 1400 06/07/20 0448 06/07/20 0448 06/07/20 2210 06/08/20 0230  HGB  --    < > 10.3*   < > 10.2*   < > 8.8* 9.3*  HCT  --    < > 31.7*   < > 31.9*  --  26.0* 28.5*  PLT  --    < > 223  --  249  --   --  205  APTT  --   --   --   --   --   --   --  50*  LABPROT  --   --   --   --   --   --   --  15.3*  INR  --   --   --   --   --   --   --  1.3*  CREATININE 2.46*   < > 3.30*  --  3.63*  --   --  2.80*  TROPONINIHS 5,229*  --   --   --   --   --   --   --    < > = values in this interval not displayed.    Estimated Creatinine Clearance: 22 mL/min (A) (by C-G formula based on SCr of 2.8 mg/dL (H)).   Medical History: Past Medical History:  Diagnosis Date   Arthritis    Atrial flutter ([place])    a. diagnosed in 12/2019 while admitted for sepsis   Collagen vascular disease ([place])    Coronary atherosclerosis of native coronary artery    a. s/p CABG in 2000 b. NST in 10/2017 showing mild to moderate per-infarct ischemia with medical management recommended   Essential hypertension    Hyperlipidemia    Prostate cancer [facility])     Assessment: Pt is a 1 YOM with a history of Afib on Eliquis prior to admission with last dose unknown. Pt has been hospitalized since 11/4, is currently ventilated with intermittent afib on amiodarone and has been started on CRRT for an AKI. Patient has been on prophylactic SQH so will omit heparin bolus and start  heparin gtt today. Last aPTT 50 @0230  on 11/8. Will monitor aPTT and HL until they correlate given prior DOAC and renal dysfx. CBC stable and no bleeding noted.   Goal of Therapy:  Heparin level 0.3-0.7 units/ml aPTT 66-102 seconds Monitor platelets by anticoagulation protocol: Yes   Plan:  Start heparin infusion at 1200 units/hr  Check a HL and aPTT in 8 hours Daily CBC while on heparin Follow up on plan for cath and restarting Eliquis when able  Jacobo Forest PharmD Candidate 2022 06/08/2020 11:24 AM

## 2020-06-08 NOTE — Progress Notes (Signed)
CRITICAL VALUE ALERT  Critical Value: PTT >200  Date & Time Notied: 2215  Provider Notified:  Narda Bonds [facility]  Orders Received/Actions taken: Hold heparin until 2330 then restart with new ordered rate. Continue to monitor.

## 2020-06-08 NOTE — Progress Notes (Signed)
Initial Nutrition Assessment  DOCUMENTATION CODES:   Not applicable  INTERVENTION:   Tube feeding via OG: Vital AF 1.2 at 55 ml/hr Pro-Source TF 45 mL TID Provides 1704 kcals, 132 g of protein and 1003 mL of free water  Add B-complex with C    NUTRITION DIAGNOSIS:   Inadequate oral intake related to acute illness as evidenced by NPO status.  GOAL:   Patient will meet greater than or equal to 90% of their needs   MONITOR:   Labs, Weight trends, TF tolerance, Skin, Vent status  REASON FOR ASSESSMENT:   Consult, Ventilator Enteral/tube feeding initiation and management  ASSESSMENT:   79 yo male admitted with 10 day hx of diarrhea with CT abdomen indicating pancolitis, went into cardiac arrest in ED and required intubation, shock requiring pressors, AKI on CKD requiring RRT. PMH includes prostate cancer with bone mets and pancreatic mass, CAD/CABG, HTN, HLD, GERD   11/04 Admitted, Vfib arrest, CT abdomen marked pancolitis 11/05 Shock due to pancolitis 11/06 Adult TF protocol initiated 11/07 CRRT initiated  Pt on CRRT Patient is currently intubated on ventilator support, curretly  Requiring levophed and vasopressin. Fentanyl drip  MV: 13.2 L/min Temp (24hrs), Avg:95.2 F (35.1 C), Min:92.8 F (33.8 C), Max:97.5 F (36.4 C)  Propofol: NONE  NG tube with tip in stomach; tolerating Vital High Protein at 40 ml/hr (started via Adult TF Protocol yesterday)  Unsure of EDW: pt with significant edema on exam. Current wt 86 kg; noted recent weights around 73 kg. Plan to utilize this as EDW at present  Nutrition-focused physical exam performed but again difficult to assess most areas given significant edema especially in extremities  Diarrhea x 10 days PTA, +C.diff. Pt dehydrated on admission; suspect recent po intake has not been adequate but unable to obtain diet and weight history from patient at this time  Labs: sodium 132 (L), potassium 4.9 (wdl), phosphorus 4.2  (Wdl), ionized calcium 0.95 (L) Meds: ss novolog, novolog q 4 hours  NUTRITION - FOCUSED PHYSICAL EXAM:    Most Recent Value  Orbital Region Mild depletion  Upper Arm Region Unable to assess  Thoracic and Lumbar Region Unable to assess  Buccal Region Unable to assess  Temple Region Moderate depletion  Clavicle Bone Region Mild depletion  Clavicle and Acromion Bone Region Mild depletion  Scapular Bone Region Mild depletion  Dorsal Hand Unable to assess  Patellar Region Unable to assess  Anterior Thigh Region Unable to assess  Posterior Calf Region Unable to assess  Edema (RD Assessment) Severe  Eyes --  [right eye with blood]  Mouth --  [pale, white gums]       Diet Order:   Diet Order            Diet NPO time specified  Diet effective now                 EDUCATION NEEDS:   Not appropriate for education at this time  Skin:  Skin Assessment: Reviewed RN Assessment  Last BM:  11/7 rectal tube  Height:   Ht Readings from Last 1 Encounters:  06/07/20 5\' 6"  (1.676 m)    Weight:   Wt Readings from Last 1 Encounters:  06/08/20 85.9 kg    BMI:  Body mass index is 30.57 kg/m.  Estimated Nutritional Needs:   Kcal:  1710 kcals  Protein:  120-145 g  Fluid:  >/= 1.5 (minimize fluid administration given sig edema on exam)   Kerman Passey MS,  RDN, LDN, CNSC Registered Dietitian III Clinical Nutrition RD Pager and On-Call Pager Number Located in [place]

## 2020-06-08 NOTE — Progress Notes (Signed)
NAME:  Samuel Gregory, MRN:  [id number], DOB:  11-Jun-1941, LOS: 4 ADMISSION DATE:  06/29/2020, CONSULTATION DATE:  11/5 REFERRING MD:  Tat/Triad, CHIEF COMPLAINT:  Shock/ resp failure from [facility]  Brief History   41 yowm never smoker with h/o prostate ca and prior c diff infection admitted 11/4 with voluminous diarrhea and abd pain and coded in ER   VF arrest > admit to ICU with pancolitis on admit CT / vent dep/ pressor dep and PCCM consulted am 11/5   History of present illness   79 y.o. male with past medical history of atrial fibrillation on Eliquis, coronary artery disease status post CABG in 2000, hypertension, hyperlipidemia, GERD, HLD,metastatic prostate cancer and history of C. difficile infection x1, who presented for evaluation of acute onset of diarrhea and arrested as w/u in progress.   Per his daugher he was ambulatory prior to acute illness   Past Medical History    Past Medical History:  Diagnosis Date  . Arthritis   . Atrial flutter ([place])    a. diagnosed in 12/2019 while admitted for sepsis  . Collagen vascular disease ([place])   . Coronary atherosclerosis of native coronary artery    a. s/p CABG in 2000 b. NST in 10/2017 showing mild to moderate per-infarct ischemia with medical management recommended  . Essential hypertension   . Hyperlipidemia   . Prostate cancer ([place])      Significant Hospital Events   VF arrest pm 11/4 > amiodarone drip 11/5 shock due to pancolitis  Consults:  PCCM 11/5  Urology 11/5 Cards   11/5   Procedures:  ET  11/5 >>> R IJ  11/5 >>> R Rad Art line 11/5 >>>  L fem art line 11/7 >  L fem HD cath 11/7 >   Significant Diagnostic Tests:   CT abd 11/4 1. Marked pancolitis that is worse of the rectosigmoid colon. Etiology could be infectious, inflammatory, ischemic. No associated bowel obstruction or perforation. Correlate with stool cultures. 2. Other imaging findings of potential clinical significance: Cholelithiasis with no acute  cholecystitis. 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 Small right inguinal hernia containing fat and fluid. Aortic Atherosclerosis  Micro Data:  COVID   19  PCR  11/4 Neg  Flu A/B    PCR   11/4  Neg  Stool 11/4 neg C diff antigen/ toxix  11/4  Both POS BC x 2  11/4 >>> MRSA PCR 11/5 neg   Antimicrobials:  Rocephin  11/4  Cipro  11/4  Flagyl  11/4  >>> Zosyn  11/4  >>> Vanc per rectalog  tube11/5  >>>   Interim history/subjective:  CRRT started overnight.  Tolerating OK thus far. On 32mcg/min levo, 0.03u/min vaso  Objective   Blood pressure 104/65, pulse 73, temperature (!) 95.4 F (35.2 C), resp. rate (!) 25, height 5\' 6"  (1.676 m), weight 85.9 kg, SpO2 100 %. CVP:  [5 mmHg-32 mmHg] 11 mmHg  Vent Mode: PRVC FiO2 (%):  [60 %-80 %] 60 % Set Rate:  [22 bmp-25 bmp] 25 bmp Vt Set:  [510 mL] 510 mL PEEP:  [5 cmH20] 5 cmH20 Plateau Pressure:  [27 cmH20-34 cmH20] 27 cmH20   Intake/Output Summary (Last 24 hours) at 06/08/2020 0921 Last data filed at 06/08/2020 0900 Gross per 24 hour  Intake 3009.24 ml  Output 2046 ml  Net 963.24 ml   Filed Weights   06/07/20 0455 06/07/20 1245 06/08/20 0600  Weight: 89.5 kg 89.5 kg 85.9  kg   CVP:  [5 mmHg-32 mmHg] 11 mmHg   Examination: General: Chronically ill appearing elderly male, in NAD HENT: [place] / AT.  ETT in place Lungs: Normal effort.  CTAB Cardiovascular: RRR, no M/R/G Abdomen: markedly distended  Extremities: Severe generalized edema particular of the scrotum Neuro: Sedated, not following commands   Assessment & Plan:   Critically ill due to acute hypoxemic and hypercarbic respiratory failure requiring mechanical ventilation - Continue full vent support. - Wean as able. - Fluid removal per CRRT. - Follow CXR.  Critically ill due to septic shock secondary to C. difficile colitis requiring titration of vasopressors. - Continue low dose levo and vaso to support BP and CRRT - Goal MAP >  65  C. difficile pancolitis. - Continue vancomycin enemas and Flagyl.  Acute kidney injury with anuria - no response to diuretics; therefore, started on CRRT 11/7 after being net +21.9L since admit NAGMA - 2/2 diarrhea. - CRRT per nephrology.  VF arrest - s/p defib x 1 and now on amio. A.fib - intermittent. - Continue amio per cards. - Cards considering cath if and when he is stabilized. - Consider heparin gtt if persists.  Metastatic prostate cancer with bone metastases, these appears stable Stable pancreatic mass. - Outpatient follow up.  Best practice:  Diet: TF Pain/Anxiety/Delirium protocol (if indicated): sedated on fent/ versed prn VAP protocol (if indicated): Bundle in place DVT prophylaxis: Unfractioned heparin GI prophylaxis: PPI Glucose control: Phase 1 glycemic control Mobility: bed rest Code Status: Full code Family Communication: Will try and call daughter Disposition: ICU   CC time: 40 min.   Montey Hora, [place] [facility] 06/08/2020, 9:38 AM

## 2020-06-09 LAB — CULTURE, BLOOD (ROUTINE X 2)
Culture: NO GROWTH
Culture: NO GROWTH
Special Requests: ADEQUATE
Special Requests: ADEQUATE

## 2020-06-09 LAB — POCT I-STAT 7, (LYTES, BLD GAS, ICA,H+H)
Acid-Base Excess: 0 mmol/L (ref 0.0–2.0)
Bicarbonate: 23.6 mmol/L (ref 20.0–28.0)
Calcium, Ion: 1.05 mmol/L — ABNORMAL LOW (ref 1.15–1.40)
HCT: 32 % — ABNORMAL LOW (ref 39.0–52.0)
Hemoglobin: 10.9 g/dL — ABNORMAL LOW (ref 13.0–17.0)
O2 Saturation: 96 %
Patient temperature: 36.8
Potassium: 4.1 mmol/L (ref 3.5–5.1)
Sodium: 136 mmol/L (ref 135–145)
TCO2: 25 mmol/L (ref 22–32)
pCO2 arterial: 32.4 mmHg (ref 32.0–48.0)
pH, Arterial: 7.47 — ABNORMAL HIGH (ref 7.350–7.450)
pO2, Arterial: 73 mmHg — ABNORMAL LOW (ref 83.0–108.0)

## 2020-06-09 LAB — RENAL FUNCTION PANEL
Albumin: 1.8 g/dL — ABNORMAL LOW (ref 3.5–5.0)
Albumin: 2.1 g/dL — ABNORMAL LOW (ref 3.5–5.0)
Anion gap: 10 (ref 5–15)
Anion gap: 13 (ref 5–15)
BUN: 19 mg/dL (ref 8–23)
BUN: 20 mg/dL (ref 8–23)
CO2: 22 mmol/L (ref 22–32)
CO2: 21 mmol/L — ABNORMAL LOW (ref 22–32)
Calcium: 7.2 mg/dL — ABNORMAL LOW (ref 8.9–10.3)
Calcium: 7.8 mg/dL — ABNORMAL LOW (ref 8.9–10.3)
Chloride: 102 mmol/L (ref 98–111)
Chloride: 101 mmol/L (ref 98–111)
Creatinine, Ser: 1.91 mg/dL — ABNORMAL HIGH (ref 0.61–1.24)
Creatinine, Ser: 1.73 mg/dL — ABNORMAL HIGH (ref 0.61–1.24)
GFR, Estimated: 35 mL/min — ABNORMAL LOW (ref >60)
GFR, Estimated: 40 mL/min — ABNORMAL LOW (ref >60)
Glucose, Bld: 181 mg/dL — ABNORMAL HIGH (ref 70–99)
Glucose, Bld: 168 mg/dL — ABNORMAL HIGH (ref 70–99)
Phosphorus: 2.2 mg/dL — ABNORMAL LOW (ref 2.5–4.6)
Phosphorus: 2.3 mg/dL — ABNORMAL LOW (ref 2.5–4.6)
Potassium: 4.2 mmol/L (ref 3.5–5.1)
Potassium: 4.3 mmol/L (ref 3.5–5.1)
Sodium: 134 mmol/L — ABNORMAL LOW (ref 135–145)
Sodium: 135 mmol/L (ref 135–145)

## 2020-06-09 LAB — HEPATIC FUNCTION PANEL
ALT: 1487 U/L — ABNORMAL HIGH (ref 0–44)
AST: 4444 U/L — ABNORMAL HIGH (ref 15–41)
Albumin: 1.8 g/dL — ABNORMAL LOW (ref 3.5–5.0)
Alkaline Phosphatase: 270 U/L — ABNORMAL HIGH (ref 38–126)
Bilirubin, Direct: 0.5 mg/dL — ABNORMAL HIGH (ref 0.0–0.2)
Indirect Bilirubin: 0.8 mg/dL (ref 0.3–0.9)
Total Bilirubin: 1.3 mg/dL — ABNORMAL HIGH (ref 0.3–1.2)
Total Protein: 4.7 g/dL — ABNORMAL LOW (ref 6.5–8.1)

## 2020-06-09 LAB — CBC
HCT: 30.5 % — ABNORMAL LOW (ref 39.0–52.0)
Hemoglobin: 10.2 g/dL — ABNORMAL LOW (ref 13.0–17.0)
MCH: 31.1 pg (ref 26.0–34.0)
MCHC: 33.4 g/dL (ref 30.0–36.0)
MCV: 93 fL (ref 80.0–100.0)
Platelets: 179 10*3/uL (ref 150–400)
RBC: 3.28 MIL/uL — ABNORMAL LOW (ref 4.22–5.81)
RDW: 14.8 % (ref 11.5–15.5)
WBC: 13.7 10*3/uL — ABNORMAL HIGH (ref 4.0–10.5)
nRBC: 1.2 % — ABNORMAL HIGH (ref 0.0–0.2)

## 2020-06-09 LAB — HEPARIN LEVEL (UNFRACTIONATED)
Heparin Unfractionated: 1.24 IU/mL — ABNORMAL HIGH (ref 0.30–0.70)
Heparin Unfractionated: 1.36 IU/mL — ABNORMAL HIGH (ref 0.30–0.70)

## 2020-06-09 LAB — GLUCOSE, CAPILLARY
Glucose-Capillary: 165 mg/dL — ABNORMAL HIGH (ref 70–99)
Glucose-Capillary: 102 mg/dL — ABNORMAL HIGH (ref 70–99)
Glucose-Capillary: 177 mg/dL — ABNORMAL HIGH (ref 70–99)
Glucose-Capillary: 214 mg/dL — ABNORMAL HIGH (ref 70–99)
Glucose-Capillary: 162 mg/dL — ABNORMAL HIGH (ref 70–99)
Glucose-Capillary: 170 mg/dL — ABNORMAL HIGH (ref 70–99)
Glucose-Capillary: 157 mg/dL — ABNORMAL HIGH (ref 70–99)
Glucose-Capillary: 202 mg/dL — ABNORMAL HIGH (ref 70–99)

## 2020-06-09 MED ORDER — POTASSIUM PHOSPHATES 15 MMOLE/5ML IV SOLN
20.0000 mmol | Freq: Once | INTRAVENOUS | Status: AC
  Administered 2020-06-09: 20 mmol via INTRAVENOUS
  Filled 2020-06-09: qty 6.67

## 2020-06-09 MED ORDER — ARTIFICIAL TEARS OPHTHALMIC OINT
TOPICAL_OINTMENT | Freq: Two times a day (BID) | OPHTHALMIC | Status: DC
  Administered 2020-06-09 – 2020-06-12 (×4): 1 via OPHTHALMIC
  Filled 2020-06-09: qty 3.5

## 2020-06-09 MED ORDER — HEPARIN (PORCINE) 25000 UT/250ML-% IV SOLN
650.0000 [IU]/h | INTRAVENOUS | Status: DC
  Administered 2020-06-12 (×2): 500 [IU]/h via INTRAVENOUS
  Administered 2020-06-14: 650 [IU]/h via INTRAVENOUS
  Filled 2020-06-09 (×2): qty 250

## 2020-06-09 MED ORDER — HEPARIN (PORCINE) 25000 UT/250ML-% IV SOLN
650.0000 [IU]/h | INTRAVENOUS | Status: DC
  Administered 2020-06-09: 650 [IU]/h via INTRAVENOUS
  Filled 2020-06-09: qty 250

## 2020-06-09 MED FILL — Medication: Qty: 1 | Status: AC

## 2020-06-09 NOTE — Consult Note (Signed)
[place] for Heparin Indication: atrial fibrillation  Allergies  Allergen Reactions  . Latex Rash  . Sulfa Antibiotics Rash    Patient Measurements: Height: 5\' 6"  (167.6 cm) Weight: 85.9 kg (189 lb 6 oz) IBW/kg (Calculated) : 63.8 Heparin Dosing Weight: 82.7 kg  Vital Signs: Temp: 97.9 F (36.6 C) (11/09 1600) BP: 129/71 (11/09 1507) Pulse Rate: 93 (11/09 1600)  Labs: Recent Labs    06/07/20 0448 06/07/20 2210 06/08/20 0230 06/08/20 0230 06/08/20 1609 06/08/20 2005 06/09/20 0447 06/09/20 1136 06/09/20 1511  HGB 10.2*   < > 9.3*   < >  --   --  10.2* 10.9*  --   HCT 31.9*   < > 28.5*  --   --   --  30.5* 32.0*  --   PLT 249  --  205  --   --   --  179  --   --   APTT  --   --  50*  --   --  >200*  --   --   --   LABPROT  --   --  15.3*  --   --   --   --   --   --   INR  --   --  1.3*  --   --   --   --   --   --   HEPARINUNFRC  --   --   --   --   --  1.10* 1.24*  --  1.36*  CREATININE 3.63*  --  2.80*  --  2.25*  --  1.91*  --   --    < > = values in this interval not displayed.    Estimated Creatinine Clearance: 32.2 mL/min (A) (by C-G formula based on SCr of 1.91 mg/dL (H)).   Medical History: Past Medical History:  Diagnosis Date  . Arthritis   . Atrial flutter ([place])    a. diagnosed in 12/2019 while admitted for sepsis  . Collagen vascular disease ([place])   . Coronary atherosclerosis of native coronary artery    a. s/p CABG in 2000 b. NST in 10/2017 showing mild to moderate per-infarct ischemia with medical management recommended  . Essential hypertension   . Hyperlipidemia   . Prostate cancer [facility])     Assessment: Pt is a 61 YOM with a history of Afib on Eliquis prior to admission with last dose unknown. Pt has been hospitalized since 11/4, is currently ventilated with intermittent afib on amiodarone and has been started on CRRT for an AKI.  Heparin level remains high at 1.36 after holding heparin x 1.5 hrs  and resuming at reduced rate this AM. CBC stable. Small amount of blood leaking from IV site per RN but not significant - she will monitor. No issues with the infusion, and level drawn appropriately per discussion with RN.  Goal of Therapy:  Heparin level 0.3-0.7 units/mL Monitor platelets by anticoagulation protocol: Yes   Plan:  Hold heparin x 1 hr Re-start heparin drip lower rate 400 units/hr at 1745 Re-check heparin level 8 hrs from resumption Monitor daily CBC, s/sx bleeding   Arturo Morton, PharmD, BCPS Please check AMION for all [place] contact numbers Clinical Pharmacist 06/09/2020 4:29 PM'

## 2020-06-09 NOTE — Progress Notes (Addendum)
Cardiology Progress Note  Patient ID: [name] MRN: [id number] DOB: 05-30-1941 Date of Encounter: 06/09/2020  Primary Cardiologist: Rozann Lesches, MD  Subjective   Chief Complaint: Intubated and sedated on vent.  HPI: Started on CRRT yesterday.  Net -5.4 L.  Profound liver injury.  Likely ischemic.  On minimal pressor support.  ROS:  All other ROS reviewed and negative. Pertinent positives noted in the HPI.     Inpatient Medications  Scheduled Meds: . aspirin  162 mg Per Tube Daily  . B-complex with vitamin C  1 tablet Per Tube Daily  . chlorhexidine gluconate (MEDLINE KIT)  15 mL Mouth Rinse BID  . Chlorhexidine Gluconate Cloth  6 each Topical Daily  . feeding supplement (PROSource TF)  45 mL Per Tube TID  . hydrocortisone sod succinate (SOLU-CORTEF) inj  50 mg Intravenous Q6H  . insulin aspart  1-3 Units Subcutaneous Q4H  . insulin aspart  3 Units Subcutaneous Q4H  . mouth rinse  15 mL Mouth Rinse 10 times per day  . pantoprazole (PROTONIX) IV  40 mg Intravenous Q24H  . sodium chloride flush  10-40 mL Intracatheter Q12H  . vancomycin  500 mg Per Tube Q6H   Continuous Infusions: .  prismasol BGK 4/2.5 400 mL/hr at 06/08/20 1954  .  prismasol BGK 4/2.5 200 mL/hr at 06/08/20 2021  . sodium chloride Stopped (06/09/20 0431)  . amiodarone 30 mg/hr (06/09/20 0644)  . dextrose Stopped (06/07/20 2017)  . feeding supplement (VITAL AF 1.2 CAL) 55 mL/hr at 06/08/20 1800  . fentaNYL infusion INTRAVENOUS 250 mcg/hr (06/09/20 4854)  . heparin    . metronidazole Stopped (06/09/20 0333)  . norepinephrine (LEVOPHED) Adult infusion 4 mcg/min (06/09/20 0600)  . prismasol BGK 4/2.5 1,600 mL/hr at 06/09/20 0238  . vasopressin 0.03 Units/min (06/09/20 0644)   PRN Meds: alteplase, heparin, midazolam, sodium chloride, sodium chloride flush   Vital Signs   Vitals:   06/09/20 0545 06/09/20 0600 06/09/20 0615 06/09/20 0630  BP:      Pulse: 84 79 79 81  Resp: (!) 25 (!) 25 (!) 25 (!)  25  Temp: 97.7 F (36.5 C) (!) 97.5 F (36.4 C) (!) 97.3 F (36.3 C) (!) 97.3 F (36.3 C)  TempSrc:      SpO2: 98% 100% 100% 100%  Weight:      Height:        Intake/Output Summary (Last 24 hours) at 06/09/2020 0700 Last data filed at 06/09/2020 0600 Gross per 24 hour  Intake 3145.4 ml  Output 8584 ml  Net -5438.6 ml   Last 3 Weights 06/08/2020 06/07/2020 06/07/2020  Weight (lbs) 189 lb 6 oz 197 lb 5 oz 197 lb 5 oz  Weight (kg) 85.9 kg 89.5 kg 89.5 kg      Telemetry  Overnight telemetry shows normal sinus rhythm with heart rate in the 70s with PACs, which I personally reviewed.   ECG  The most recent ECG shows sinus tachycardia, heart rate 117, which I personally reviewed.   Physical Exam   Vitals:   06/09/20 0545 06/09/20 0600 06/09/20 0615 06/09/20 0630  BP:      Pulse: 84 79 79 81  Resp: (!) 25 (!) 25 (!) 25 (!) 25  Temp: 97.7 F (36.5 C) (!) 97.5 F (36.4 C) (!) 97.3 F (36.3 C) (!) 97.3 F (36.3 C)  TempSrc:      SpO2: 98% 100% 100% 100%  Weight:      Height:  Intake/Output Summary (Last 24 hours) at 06/09/2020 0700 Last data filed at 06/09/2020 0600 Gross per 24 hour  Intake 3145.4 ml  Output 8584 ml  Net -5438.6 ml    Last 3 Weights 06/08/2020 06/07/2020 06/07/2020  Weight (lbs) 189 lb 6 oz 197 lb 5 oz 197 lb 5 oz  Weight (kg) 85.9 kg 89.5 kg 89.5 kg    Body mass index is 30.57 kg/m.  General: Intubated and sedated on vent, grossly edematous Head: Atraumatic, normal size  Eyes: Edema noted in the eyelids Neck: Supple, +JVD Endocrine: No thryomegaly Cardiac: Normal S1, S2; RRR; no murmurs, rubs, or gallops Lungs: Diminished breath sounds bilaterally Abd: Soft, nontender, no hepatomegaly  Ext: 2+ pitting edema up to thighs Musculoskeletal: No deformities, BUE and BLE strength normal and equal Skin: Warm and dry, no rashes   Neuro: Intubated and sedated on vent  Labs  High Sensitivity Troponin:   Recent Labs  Lab 06/03/2020 2016  06/05/20 0234 06/05/20 1300  TROPONINIHS 12 818* 5,229*     Cardiac EnzymesNo results for input(s): TROPONINI in the last 168 hours. No results for input(s): TROPIPOC in the last 168 hours.  Chemistry Recent Labs  Lab 06/07/20 0448 06/07/20 2210 06/08/20 0230 06/08/20 1609 06/09/20 0447  NA 129*   < > 132* 132* 134*  K 4.6   < > 4.9 4.4 4.2  CL 94*  --  100 99 102  CO2 24  --  20* 20* 22  GLUCOSE 233*  --  117* 176* 181*  BUN 27*  --  22 21 19   CREATININE 3.63*  --  2.80* 2.25* 1.91*  CALCIUM 6.1*  --  6.5* 6.8* 7.2*  PROT 3.9*  --  4.2*  --  4.7*  ALBUMIN 1.5*  --  1.6*  1.7* 1.7* 1.8*  1.8*  AST 75*  --  131*  --  4,444*  ALT 48*  --  78*  --  1,487*  ALKPHOS 69  --  97  --  270*  BILITOT 0.7  --  0.8  --  1.3*  GFRNONAA 16*  --  22* 29* 35*  ANIONGAP 11  --  12 13 10    < > = values in this interval not displayed.    Hematology Recent Labs  Lab 06/07/20 0448 06/07/20 0448 06/07/20 2210 06/08/20 0230 06/09/20 0447  WBC 17.0*  --   --  15.6* 13.7*  RBC 3.24*  --   --  2.99* 3.28*  HGB 10.2*   < > 8.8* 9.3* 10.2*  HCT 31.9*   < > 26.0* 28.5* 30.5*  MCV 98.5  --   --  95.3 93.0  MCH 31.5  --   --  31.1 31.1  MCHC 32.0  --   --  32.6 33.4  RDW 14.5  --   --  14.4 14.8  PLT 249  --   --  205 179   < > = values in this interval not displayed.   BNPNo results for input(s): BNP, PROBNP in the last 168 hours.  DDimer No results for input(s): DDIMER in the last 168 hours.   Radiology  No results found.  Cardiac Studies  TTE 06/05/2020 1. Left ventricular ejection fraction, by estimation, is 40%. The left  ventricle has normal function. The left ventricle demonstrates global  hypokinesis. Left ventricular diastolic parameters are indeterminate.  2. Right ventricular systolic function was not well visualized. The right  ventricular size is not well visualized.  3.  The mitral valve is normal in structure. No evidence of mitral valve  regurgitation. No evidence  of mitral stenosis.  4. The aortic valve is tricuspid. Aortic valve regurgitation is not  visualized. No aortic stenosis is present.  5. The inferior vena cava is normal in size with greater than 50%  respiratory variability, suggesting right atrial pressure of 3 mmHg.   Patient Profile  Samuel Gregory is a 79 y.o. male with CAD status post CABG, paroxysmal atrial flutter, metastatic prostate cancer, hypertension who was admitted to [facility] on 06/07/2020.  He was found to have pancolitis and C. difficile.  His course was complicated on 97/09/8204 with VF arrest.  Assessment & Plan   1.  VF arrest/septic shock/AKI on CRRT/acute liver injury/acute systolic heart failure -Patient was admitted to [facility] on 06/22/2020 with abdominal pain.  Found to have severe pancolitis secondary to C. difficile.  Course complicated by VF arrest.  Underwent defib x1.  Remains on amiodarone.  In sinus rhythm.  No recurrent ventricular arrhythmias. -He now has severe AKI on CRRT.  Has profound liver injury likely related to septic shock. -EF is 40%. -VF arrest is secondary to septic shock and severe acidosis in the setting of severe C. difficile pancolitis. -We will continue with supportive care for now.  Amio on hold due to liver injury.  He is on a heparin drip as well for his A. Fib. -Would consider cardiac catheterization once he is out of ICU.  2.  Atrial fibrillation -Telemetry shows sinus rhythm with PACs.  Amio on hold for now due to liver injury.  On heparin drip.  Transition to NOAC when able.  For questions or updates, please contact [place] Please consult [email or web address] for contact info under    Time Spent with Patient: I have spent a total of 25 minutes with patient reviewing hospital notes, telemetry, EKGs, labs and examining the patient as well as establishing an assessment and plan that was discussed with the patient.  > 50% of time was spent in direct patient care.     Signed, Addison Naegeli. Audie Box, [place]  06/09/2020 7:00 AM

## 2020-06-09 NOTE — Consult Note (Signed)
[place] for Heparin Indication: atrial fibrillation  Allergies  Allergen Reactions  . Latex Rash  . Sulfa Antibiotics Rash    Patient Measurements: Height: 5\' 6"  (167.6 cm) Weight: 85.9 kg (189 lb 6 oz) IBW/kg (Calculated) : 63.8 Heparin Dosing Weight: 82.7 kg  Vital Signs: Temp: 96.3 F (35.7 C) (11/09 0500) Temp Source: Esophageal (11/09 0000) BP: 120/64 (11/09 0421) Pulse Rate: 80 (11/09 0500)  Labs: Recent Labs    06/07/20 0448 06/07/20 0448 06/07/20 2210 06/07/20 2210 06/08/20 0230 06/08/20 1609 06/08/20 2005 06/09/20 0447  HGB 10.2*   < > 8.8*   < > 9.3*  --   --  10.2*  HCT 31.9*   < > 26.0*  --  28.5*  --   --  30.5*  PLT 249  --   --   --  205  --   --  179  APTT  --   --   --   --  50*  --  >200*  --   LABPROT  --   --   --   --  15.3*  --   --   --   INR  --   --   --   --  1.3*  --   --   --   HEPARINUNFRC  --   --   --   --   --   --  1.10* 1.24*  CREATININE 3.63*   < >  --   --  2.80* 2.25*  --  1.91*   < > = values in this interval not displayed.    Estimated Creatinine Clearance: 32.2 mL/min (A) (by C-G formula based on SCr of 1.91 mg/dL (H)).   Medical History: Past Medical History:  Diagnosis Date  . Arthritis   . Atrial flutter ([place])    a. diagnosed in 12/2019 while admitted for sepsis  . Collagen vascular disease ([place])   . Coronary atherosclerosis of native coronary artery    a. s/p CABG in 2000 b. NST in 10/2017 showing mild to moderate per-infarct ischemia with medical management recommended  . Essential hypertension   . Hyperlipidemia   . Prostate cancer [facility]     Assessment: Pt is a 8 YOM with a history of Afib on Eliquis prior to admission with last dose unknown. Pt has been hospitalized since 11/4, is currently ventilated with intermittent afib on amiodarone and has been started on CRRT for an AKI. Patient has been on prophylactic SQH so will omit heparin bolus and start heparin gtt  today. Last aPTT 50 @0230  on 11/8. Will monitor aPTT and HL until they correlate given prior DOAC and renal dysfx. CBC stable and no bleeding noted.   11/9 AM update:  Heparin level remains elevated No issues per RN-drawn appropriately   Goal of Therapy:  Heparin level 0.3-0.7 units/mL Monitor platelets by anticoagulation protocol: Yes   Plan:  Hold heparin x 1.5 hr Re-start heparin drip at 650 units/hr at 0730 Re-check heparin level at [place], PharmD, [place] Pharmacist Phone: [phone]

## 2020-06-09 NOTE — Progress Notes (Signed)
NAME:  Samuel Gregory, MRN:  [id number], DOB:  1941-06-13, LOS: 5 ADMISSION DATE:  06/03/2020, CONSULTATION DATE:  11/5 REFERRING MD:  Tat/Triad, CHIEF COMPLAINT:  Shock/ resp failure from [facility]  Brief History   54 yowm never smoker with h/o prostate ca and prior c diff infection admitted 11/4 with voluminous diarrhea and abd pain and coded in ER   VF arrest > admit to ICU with pancolitis on admit CT / vent dep/ pressor dep and PCCM consulted am 11/5   History of present illness   79 y.o. male with past medical history of atrial fibrillation on Eliquis, coronary artery disease status post CABG in 2000, hypertension, hyperlipidemia, GERD, HLD,metastatic prostate cancer and history of C. difficile infection x1, who presented for evaluation of acute onset of diarrhea and arrested as w/u in progress.   Per his daugher he was ambulatory prior to acute illness   Past Medical History    Past Medical History:  Diagnosis Date  . Arthritis   . Atrial flutter ([place])    a. diagnosed in 12/2019 while admitted for sepsis  . Collagen vascular disease ([place])   . Coronary atherosclerosis of native coronary artery    a. s/p CABG in 2000 b. NST in 10/2017 showing mild to moderate per-infarct ischemia with medical management recommended  . Essential hypertension   . Hyperlipidemia   . Prostate cancer ([place])      Significant Hospital Events   VF arrest pm 11/4 > amiodarone drip 11/5 shock due to pancolitis  Consults:  PCCM 11/5  Urology 11/5 Cards   11/5   Procedures:  ET  11/5 >>> R IJ  11/5 >>> R Rad Art line 11/5 >>>  L fem art line 11/7 >  L fem HD cath 11/7 >   Significant Diagnostic Tests:   CT abd 11/4 1. Marked pancolitis that is worse of the rectosigmoid colon. Etiology could be infectious, inflammatory, ischemic. No associated bowel obstruction or perforation. Correlate with stool cultures. 2. Other imaging findings of potential clinical significance: Cholelithiasis with no acute  cholecystitis. 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 Small right inguinal hernia containing fat and fluid. Aortic Atherosclerosis  Micro Data:  COVID   19  PCR  11/4 Neg  Flu A/B    PCR   11/4  Neg  Stool 11/4 neg C diff antigen/ toxix  11/4  Both POS BC x 2  11/4 >>> MRSA PCR 11/5 neg   Antimicrobials:  Rocephin  11/4  Cipro  11/4  Flagyl  11/4  >>> Zosyn  11/4  >>> Vanc per rectalog  tube11/5  >>>  Interim history/subjective:  Tolerating CRRT well.  -5.7L past 24 hours, Net +16L. On 12mcg/min levo.  Objective   Blood pressure 115/62, pulse 83, temperature (!) 97.3 F (36.3 C), resp. rate (!) 25, height 5\' 6"  (1.676 m), weight 85.9 kg, SpO2 98 %. CVP:  [4 mmHg-22 mmHg] 10 mmHg  Vent Mode: PRVC FiO2 (%):  [40 %-60 %] 40 % Set Rate:  [25 bmp] 25 bmp Vt Set:  [510 mL] 510 mL PEEP:  [5 cmH20] 5 cmH20 Plateau Pressure:  [20 cmH20-29 cmH20] 21 cmH20   Intake/Output Summary (Last 24 hours) at 06/09/2020 0808 Last data filed at 06/09/2020 0800 Gross per 24 hour  Intake 3280.6 ml  Output 8903 ml  Net -5622.4 ml   Filed Weights   06/07/20 0455 06/07/20 1245 06/08/20 0600  Weight: 89.5 kg 89.5 kg 85.9 kg  CVP:  [4 mmHg-22 mmHg] 10 mmHg   Examination: General: Chronically ill appearing elderly male, in NAD HENT: [place] / AT.  ETT in place Lungs: Normal effort.  CTAB Cardiovascular: RRR, no M/R/G Abdomen: markedly distended, soft Extremities: Severe generalized edema particular of the scrotum, improving gradually Neuro: Sedated, not following commands  Assessment & Plan:   Critically ill due to acute hypoxemic and hypercarbic respiratory failure requiring mechanical ventilation - Continue full vent support - Wean as able - Aggressive fluid removal per CRRT - Follow CXR  Critically ill due to septic shock secondary to C. difficile colitis requiring titration of vasopressors. - Continue low dose levo and vaso to support BP and CRRT -  Continue stress steroids - Goal MAP > 65  C. difficile pancolitis. - Continue vancomycin and flagyl  Acute kidney injury with anuria - no response to diuretics; therefore, started on CRRT 11/7 after being net +21.9L since admit.  Responding very well with -5.7L over past 24 hours. NAGMA - 2/2 diarrhea.  Resolved - CRRT per nephrology.  Transaminitis - presumed 2/2 shock + hepatic congestion in setting profound volume overload; however, exacerbated by amiodarone. - Supportive care. - D/c amio for now, discussed with cards. - Trend LFT's.  VF arrest - s/p defib x 1 and now on amio. A.fib - intermittent.  Currently in NSR. - D/c amio as above. - Cards considering cath if and when he is stabilized. - Continue heparin gtt.  Metastatic prostate cancer with bone metastases, these appears stable Stable pancreatic mass. - Outpatient follow up.  Best practice:  Diet: TF Pain/Anxiety/Delirium protocol (if indicated): sedated on fent/ versed prn VAP protocol (if indicated): Bundle in place DVT prophylaxis: Unfractioned heparin GI prophylaxis: PPI Glucose control: Phase 1 glycemic control Mobility: bed rest Code Status: Full code Family Communication: Will try and call daughter Disposition: ICU   CC time: 40 min.   Montey Hora, [place] [facility] 06/09/2020, 8:08 AM

## 2020-06-09 NOTE — Progress Notes (Signed)
[facility] NEPHROLOGY PROGRESS NOTE  Assessment/ Plan: Pt is a 79 y.o. yo male with CAD, atrial flutter, CKD, HTN, HLD, prostate cancer who present w/ C. difficile diarrhea, shock, cardiac arrest.  #Oliguric AKI on CKD stage III due to ATN after cardiac arrest/shock: Did not respond with IV diuretics and hemodynamic support.  Started CRRT on 11/7.  Tolerating UF with the help of pressors to maintain blood pressure.  He still looks volume up therefore continue ultrafiltration around 200 cc an hour night.  Discussed with ICU nurse. Continue current CRRT prescription, all 4K. CT scan without hydronephrosis. Monitor urine output and labs.  #Acute hypoxic, hypercapnic respiratory failure requiring mechanical ventilation: PCCM is following.  #Septic shock due to C. difficile colitis: Antibiotics per primary team.  On pressors including Levophed and vasopressin.  Maintaining MAP more than 65.  #History of metastatic prostate cancer with bone metastasis and stable pancreatic mass.  # Non-anion gap metabolic acidosis associated with diarrhea.  Bicarb was as low as 12.    Improved now.  #Hyponatremia/hypochloremia: Likely related to free water retention in setting of AKI as well as isotonic fluid loss from diarrhea.  Now managed with CRRT.  # Hypocalcemia:  Very low albumin level.  Replete calcium as protocol.  Subjective: Seen and examined ICU.  Currently on 2 pressors with acceptable blood pressure.  Tolerating UF well.  Remains intubated and sedated. Objective Vital signs in last 24 hours: Vitals:   06/09/20 0615 06/09/20 0630 06/09/20 0724 06/09/20 0800  BP:   115/62   Pulse: 79 81  83  Resp: (!) 25 (!) 25  (!) 25  Temp: (!) 97.3 F (36.3 C) (!) 97.3 F (36.3 C)  (!) 97.3 F (36.3 C)  TempSrc:      SpO2: 100% 100% 99% 98%  Weight:      Height:       Weight change:   Intake/Output Summary (Last 24 hours) at 06/09/2020 0850 Last data filed at 06/09/2020 0800 Gross  per 24 hour  Intake 3280.6 ml  Output 9313 ml  Net -6032.4 ml       Labs: Basic Metabolic Panel: Recent Labs  Lab 06/08/20 0230 06/08/20 1609 06/09/20 0447  NA 132* 132* 134*  K 4.9 4.4 4.2  CL 100 99 102  CO2 20* 20* 22  GLUCOSE 117* 176* 181*  BUN 22 21 19   CREATININE 2.80* 2.25* 1.91*  CALCIUM 6.5* 6.8* 7.2*  PHOS 4.2 2.8 2.2*   Liver Function Tests: Recent Labs  Lab 06/07/20 0448 06/07/20 0448 06/08/20 0230 06/08/20 1609 06/09/20 0447  AST 75*  --  131*  --  4,444*  ALT 48*  --  78*  --  1,487*  ALKPHOS 69  --  97  --  270*  BILITOT 0.7  --  0.8  --  1.3*  PROT 3.9*  --  4.2*  --  4.7*  ALBUMIN 1.5*   < > 1.6*  1.7* 1.7* 1.8*  1.8*   < > = values in this interval not displayed.   No results for input(s): LIPASE, AMYLASE in the last 168 hours. No results for input(s): AMMONIA in the last 168 hours. CBC: Recent Labs  Lab 06/02/2020 2016 06/10/2020 2016 06/06/20 0407 06/06/20 0407 06/06/20 1400 06/06/20 1400 06/07/20 0448 06/07/20 0448 06/07/20 2210 06/08/20 0230 06/09/20 0447  WBC 13.1*   < > 13.0*   < > 16.1*   < > 17.0*  --   --  15.6* 13.7*  NEUTROABS 9.3*  --   --   --   --   --   --   --   --   --   --   HGB 13.2   < > 8.5*   < > 10.3*   < > 10.2*   < > 8.8* 9.3* 10.2*  HCT 40.9   < > 26.6*   < > 31.7*   < > 31.9*   < > 26.0* 28.5* 30.5*  MCV 99.5   < > 102.3*  --  96.9  --  98.5  --   --  95.3 93.0  PLT 314   < > 189   < > 223   < > 249  --   --  205 179   < > = values in this interval not displayed.   Cardiac Enzymes: No results for input(s): CKTOTAL, CKMB, CKMBINDEX, TROPONINI in the last 168 hours. CBG: Recent Labs  Lab 06/08/20 1612 06/08/20 2002 06/09/20 0043 06/09/20 0443 06/09/20 0832  GLUCAP 171* 214* 177* 165* 162*    Iron Studies: No results for input(s): IRON, TIBC, TRANSFERRIN, FERRITIN in the last 72 hours. Studies/Results: No results found.  Medications: Infusions: .  prismasol BGK 4/2.5 400 mL/hr at 06/09/20  0828  .  prismasol BGK 4/2.5 200 mL/hr at 06/08/20 2021  . sodium chloride Stopped (06/09/20 0431)  . dextrose Stopped (06/07/20 2017)  . feeding supplement (VITAL AF 1.2 CAL) 55 mL/hr at 06/09/20 0800  . fentaNYL infusion INTRAVENOUS 250 mcg/hr (06/09/20 0800)  . heparin 650 Units/hr (06/09/20 0800)  . metronidazole Stopped (06/09/20 0333)  . norepinephrine (LEVOPHED) Adult infusion 5 mcg/min (06/09/20 0846)  . prismasol BGK 4/2.5 1,600 mL/hr at 06/09/20 0238  . vasopressin 0.03 Units/min (06/09/20 0800)    Scheduled Medications: . aspirin  162 mg Per Tube Daily  . B-complex with vitamin C  1 tablet Per Tube Daily  . chlorhexidine gluconate (MEDLINE KIT)  15 mL Mouth Rinse BID  . Chlorhexidine Gluconate Cloth  6 each Topical Daily  . feeding supplement (PROSource TF)  45 mL Per Tube TID  . hydrocortisone sod succinate (SOLU-CORTEF) inj  50 mg Intravenous Q6H  . insulin aspart  1-3 Units Subcutaneous Q4H  . insulin aspart  3 Units Subcutaneous Q4H  . mouth rinse  15 mL Mouth Rinse 10 times per day  . pantoprazole (PROTONIX) IV  40 mg Intravenous Q24H  . sodium chloride flush  10-40 mL Intracatheter Q12H  . vancomycin  500 mg Per Tube Q6H    have reviewed scheduled and prn medications.  Physical Exam: General: Intubated, sedated, Heart:RRR, s1s2 nl Lungs: Coarse breath sound bilateral, Abdomen: Soft, nondistended Extremities: Has anasarca with pitting edema, not much improvement Dialysis Access: Groin catheter.  Talullah Abate Prasad Codie Krogh 06/09/2020,8:50 AM  LOS: 5 days  Pager: [phone]

## 2020-06-10 ENCOUNTER — Inpatient Hospital Stay (HOSPITAL_COMMUNITY): Payer: Medicare Other

## 2020-06-10 DIAGNOSIS — I4819 Other persistent atrial fibrillation: Secondary | ICD-10-CM

## 2020-06-10 DIAGNOSIS — K7200 Acute and subacute hepatic failure without coma: Secondary | ICD-10-CM

## 2020-06-10 DIAGNOSIS — I471 Supraventricular tachycardia: Secondary | ICD-10-CM

## 2020-06-10 LAB — CBC
HCT: 35.6 % — ABNORMAL LOW (ref 39.0–52.0)
Hemoglobin: 11.6 g/dL — ABNORMAL LOW (ref 13.0–17.0)
MCH: 30.9 pg (ref 26.0–34.0)
MCHC: 32.6 g/dL (ref 30.0–36.0)
MCV: 94.7 fL (ref 80.0–100.0)
Platelets: 245 10*3/uL (ref 150–400)
RBC: 3.76 MIL/uL — ABNORMAL LOW (ref 4.22–5.81)
RDW: 15.1 % (ref 11.5–15.5)
WBC: 20.5 10*3/uL — ABNORMAL HIGH (ref 4.0–10.5)
nRBC: 5 % — ABNORMAL HIGH (ref 0.0–0.2)

## 2020-06-10 LAB — HEPARIN LEVEL (UNFRACTIONATED)
Heparin Unfractionated: 0.94 IU/mL — ABNORMAL HIGH (ref 0.30–0.70)
Heparin Unfractionated: 0.58 IU/mL (ref 0.30–0.70)

## 2020-06-10 LAB — GLUCOSE, CAPILLARY
Glucose-Capillary: 150 mg/dL — ABNORMAL HIGH (ref 70–99)
Glucose-Capillary: 163 mg/dL — ABNORMAL HIGH (ref 70–99)
Glucose-Capillary: 164 mg/dL — ABNORMAL HIGH (ref 70–99)
Glucose-Capillary: 166 mg/dL — ABNORMAL HIGH (ref 70–99)
Glucose-Capillary: 172 mg/dL — ABNORMAL HIGH (ref 70–99)

## 2020-06-10 LAB — HEPATIC FUNCTION PANEL
ALT: 1225 U/L — ABNORMAL HIGH (ref 0–44)
AST: 1491 U/L — ABNORMAL HIGH (ref 15–41)
Albumin: 2 g/dL — ABNORMAL LOW (ref 3.5–5.0)
Alkaline Phosphatase: 467 U/L — ABNORMAL HIGH (ref 38–126)
Bilirubin, Direct: 0.5 mg/dL — ABNORMAL HIGH (ref 0.0–0.2)
Indirect Bilirubin: 0.7 mg/dL (ref 0.3–0.9)
Total Bilirubin: 1.2 mg/dL (ref 0.3–1.2)
Total Protein: 5.3 g/dL — ABNORMAL LOW (ref 6.5–8.1)

## 2020-06-10 LAB — RENAL FUNCTION PANEL
Albumin: 2 g/dL — ABNORMAL LOW (ref 3.5–5.0)
Albumin: 2.2 g/dL — ABNORMAL LOW (ref 3.5–5.0)
Anion gap: 11 (ref 5–15)
Anion gap: 12 (ref 5–15)
BUN: 25 mg/dL — ABNORMAL HIGH (ref 8–23)
BUN: 28 mg/dL — ABNORMAL HIGH (ref 8–23)
CO2: 20 mmol/L — ABNORMAL LOW (ref 22–32)
CO2: 21 mmol/L — ABNORMAL LOW (ref 22–32)
Calcium: 7.6 mg/dL — ABNORMAL LOW (ref 8.9–10.3)
Calcium: 8.3 mg/dL — ABNORMAL LOW (ref 8.9–10.3)
Chloride: 102 mmol/L (ref 98–111)
Chloride: 102 mmol/L (ref 98–111)
Creatinine, Ser: 1.67 mg/dL — ABNORMAL HIGH (ref 0.61–1.24)
Creatinine, Ser: 1.63 mg/dL — ABNORMAL HIGH (ref 0.61–1.24)
GFR, Estimated: 41 mL/min — ABNORMAL LOW (ref >60)
GFR, Estimated: 43 mL/min — ABNORMAL LOW (ref >60)
Glucose, Bld: 185 mg/dL — ABNORMAL HIGH (ref 70–99)
Glucose, Bld: 173 mg/dL — ABNORMAL HIGH (ref 70–99)
Phosphorus: 3 mg/dL (ref 2.5–4.6)
Phosphorus: 2.7 mg/dL (ref 2.5–4.6)
Potassium: 4.3 mmol/L (ref 3.5–5.1)
Potassium: 4.3 mmol/L (ref 3.5–5.1)
Sodium: 133 mmol/L — ABNORMAL LOW (ref 135–145)
Sodium: 135 mmol/L (ref 135–145)

## 2020-06-10 LAB — CALCIUM, IONIZED: Calcium, Ionized, Serum: 3.5 mg/dL — ABNORMAL LOW (ref 4.5–5.6)

## 2020-06-10 MED ORDER — AMIODARONE HCL IN DEXTROSE 360-4.14 MG/200ML-% IV SOLN
30.0000 mg/h | INTRAVENOUS | Status: DC
  Administered 2020-06-11 – 2020-06-14 (×8): 30 mg/h via INTRAVENOUS
  Filled 2020-06-10 (×7): qty 200

## 2020-06-10 MED ORDER — ALBUMIN HUMAN 25 % IV SOLN
12.5000 g | Freq: Four times a day (QID) | INTRAVENOUS | Status: AC
  Administered 2020-06-10 – 2020-06-11 (×4): 12.5 g via INTRAVENOUS
  Filled 2020-06-10 (×4): qty 50

## 2020-06-10 MED ORDER — CALCIUM GLUCONATE-NACL 2-0.675 GM/100ML-% IV SOLN
2.0000 g | Freq: Once | INTRAVENOUS | Status: AC
  Administered 2020-06-10: 2000 mg via INTRAVENOUS
  Filled 2020-06-10: qty 100

## 2020-06-10 MED ORDER — AMIODARONE HCL IN DEXTROSE 360-4.14 MG/200ML-% IV SOLN
60.0000 mg/h | INTRAVENOUS | Status: AC
  Administered 2020-06-10 (×2): 60 mg/h via INTRAVENOUS
  Filled 2020-06-10 (×3): qty 200

## 2020-06-10 NOTE — Consult Note (Signed)
[place] for Heparin Indication: atrial fibrillation  Allergies  Allergen Reactions  . Latex Rash  . Sulfa Antibiotics Rash    Patient Measurements: Height: 5\' 6"  (167.6 cm) Weight: 85.9 kg (189 lb 6 oz) IBW/kg (Calculated) : 63.8 Heparin Dosing Weight: 82.7 kg  Vital Signs: Temp: 98.4 F (36.9 C) (11/10 1600) BP: 135/64 (11/10 1455) Pulse Rate: 84 (11/10 1500)  Labs: Recent Labs    06/08/20 0230 06/08/20 1609 06/08/20 2005 06/08/20 2005 06/09/20 0447 06/09/20 0447 06/09/20 1136 06/09/20 1511 06/10/20 0255 06/10/20 0430 06/10/20 1535  HGB 9.3*  --   --   --  10.2*   < > 10.9*  --   --  11.6*  --   HCT 28.5*  --   --   --  30.5*  --  32.0*  --   --  35.6*  --   PLT 205  --   --   --  179  --   --   --   --  245  --   APTT 50*  --  >200*  --   --   --   --   --   --   --   --   LABPROT 15.3*  --   --   --   --   --   --   --   --   --   --   INR 1.3*  --   --   --   --   --   --   --   --   --   --   HEPARINUNFRC  --   --  1.10*   < > 1.24*   < >  --  1.36* 0.94*  --  0.58  CREATININE 2.80*   < >  --    < > 1.91*   < >  --  1.73*  --  1.67* 1.63*   < > = values in this interval not displayed.    Estimated Creatinine Clearance: 37.7 mL/min (A) (by C-G formula based on SCr of 1.63 mg/dL (H)).  Assessment: 79 y.o. male with h/o Afib on apixaban prior to admit. Patient transitioned over to heparin.   Dose adjusted this am, repeat level at goal (0.58) on 300 units/hr. No bleeding issues noted. CBC stable overnight.   Goal of Therapy:  Heparin level 0.3-0.7 units/mL Monitor platelets by anticoagulation protocol: Yes   Plan:  Continue Heparin at 300 units/hr Heparin level in am  Erin Hearing PharmD., BCPS Clinical Pharmacist 06/10/2020 4:52 PM

## 2020-06-10 NOTE — Consult Note (Signed)
[place] for Heparin Indication: atrial fibrillation  Allergies  Allergen Reactions  . Latex Rash  . Sulfa Antibiotics Rash    Patient Measurements: Height: 5\' 6"  (167.6 cm) Weight: 85.9 kg (189 lb 6 oz) IBW/kg (Calculated) : 63.8 Heparin Dosing Weight: 82.7 kg  Vital Signs: Temp: 98.6 F (37 C) (11/10 0145) Temp Source: Esophageal (11/10 0000) Pulse Rate: 96 (11/10 0145)  Labs: Recent Labs     0000 06/07/20 0448 06/07/20 2210 06/08/20 0230 06/08/20 0230 06/08/20 1609 06/08/20 2005 06/08/20 2005 06/09/20 0447 06/09/20 1136 06/09/20 1511 06/10/20 0255  HGB   < > 10.2*   < > 9.3*  --   --   --   --  10.2* 10.9*  --   --   HCT  --  31.9*   < > 28.5*  --   --   --   --  30.5* 32.0*  --   --   PLT  --  249  --  205  --   --   --   --  179  --   --   --   APTT  --   --   --  50*  --   --  >200*  --   --   --   --   --   LABPROT  --   --   --  15.3*  --   --   --   --   --   --   --   --   INR  --   --   --  1.3*  --   --   --   --   --   --   --   --   HEPARINUNFRC  --   --   --   --   --   --  1.10*   < > 1.24*  --  1.36* 0.94*  CREATININE  --  3.63*  --  2.80*   < > 2.25*  --   --  1.91*  --  1.73*  --    < > = values in this interval not displayed.    Estimated Creatinine Clearance: 35.6 mL/min (A) (by C-G formula based on SCr of 1.73 mg/dL (H)).  Assessment: 79 y.o. male with h/o Afib, Eliquis on hold, for heparin  Goal of Therapy:  Heparin level 0.3-0.7 units/mL Monitor platelets by anticoagulation protocol: Yes   Plan:  Decrease Heparin 300 units/hr Check heparin level in 8 hours.  Phillis Knack, PharmD, BCPS  06/10/2020 3:52 AM'

## 2020-06-10 NOTE — Progress Notes (Signed)
Cardiology Progress Note  Patient ID: Samuel Gregory MRN: [id number] DOB: Dec 08, 1940 Date of Encounter: 06/10/2020  Primary Cardiologist: Rozann Lesches, MD  Subjective   Chief Complaint: None.  HPI: Frequent atrial tachycardia episodes overnight. Intubated and sedated on vent. Liver function improving. On CRRT.  ROS:  All other ROS reviewed and negative. Pertinent positives noted in the HPI.     Inpatient Medications  Scheduled Meds: . artificial tears   Both Eyes BID  . aspirin  162 mg Per Tube Daily  . B-complex with vitamin C  1 tablet Per Tube Daily  . chlorhexidine gluconate (MEDLINE KIT)  15 mL Mouth Rinse BID  . Chlorhexidine Gluconate Cloth  6 each Topical Daily  . feeding supplement (PROSource TF)  45 mL Per Tube TID  . hydrocortisone sod succinate (SOLU-CORTEF) inj  50 mg Intravenous Q6H  . insulin aspart  1-3 Units Subcutaneous Q4H  . insulin aspart  3 Units Subcutaneous Q4H  . mouth rinse  15 mL Mouth Rinse 10 times per day  . pantoprazole (PROTONIX) IV  40 mg Intravenous Q24H  . sodium chloride flush  10-40 mL Intracatheter Q12H  . vancomycin  500 mg Per Tube Q6H   Continuous Infusions: .  prismasol BGK 4/2.5 400 mL/hr at 06/09/20 2130  .  prismasol BGK 4/2.5 200 mL/hr at 06/09/20 2157  . sodium chloride 5 mL/hr at 06/10/20 0700  . dextrose Stopped (06/07/20 2017)  . feeding supplement (VITAL AF 1.2 CAL) 1,000 mL (06/10/20 0804)  . fentaNYL infusion INTRAVENOUS 200 mcg/hr (06/10/20 0746)  . heparin 300 Units/hr (06/10/20 0700)  . metronidazole Stopped (06/10/20 0409)  . norepinephrine (LEVOPHED) Adult infusion 4 mcg/min (06/10/20 0752)  . prismasol BGK 4/2.5 1,600 mL/hr at 06/10/20 0743  . vasopressin 0.03 Units/min (06/10/20 0700)   PRN Meds: alteplase, heparin, midazolam, sodium chloride, sodium chloride flush   Vital Signs   Vitals:   06/10/20 0545 06/10/20 0600 06/10/20 0615 06/10/20 0811  BP:    132/65  Pulse: (!) 137 93 93   Resp: $Remo'20 17 18     'dzAJb$ Temp: 98.6 F (37 C) 98.6 F (37 C) 98.6 F (37 C)   TempSrc:      SpO2: 96% 97% 97% 96%  Weight:      Height:        Intake/Output Summary (Last 24 hours) at 06/10/2020 0828 Last data filed at 06/10/2020 0700 Gross per 24 hour  Intake 3382.49 ml  Output 8663 ml  Net -5280.51 ml   Last 3 Weights 06/08/2020 06/07/2020 06/07/2020  Weight (lbs) 189 lb 6 oz 197 lb 5 oz 197 lb 5 oz  Weight (kg) 85.9 kg 89.5 kg 89.5 kg      Telemetry  Overnight telemetry shows normal sinus rhythm with heart rate in the 90s, frequent atrial tachycardia episodes, which I personally reviewed.   ECG  The most recent ECG shows atrial tachycardia, heart rate 138, long RP tachycardia, which I personally reviewed.   Physical Exam   Vitals:   06/10/20 0545 06/10/20 0600 06/10/20 0615 06/10/20 0811  BP:    132/65  Pulse: (!) 137 93 93   Resp: $Remo'20 17 18   'yGWRM$ Temp: 98.6 F (37 C) 98.6 F (37 C) 98.6 F (37 C)   TempSrc:      SpO2: 96% 97% 97% 96%  Weight:      Height:         Intake/Output Summary (Last 24 hours) at 06/10/2020 4650 Last data filed at  06/10/2020 0700 Gross per 24 hour  Intake 3382.49 ml  Output 8663 ml  Net -5280.51 ml    Last 3 Weights 06/08/2020 06/07/2020 06/07/2020  Weight (lbs) 189 lb 6 oz 197 lb 5 oz 197 lb 5 oz  Weight (kg) 85.9 kg 89.5 kg 89.5 kg    Body mass index is 30.57 kg/m.  General: Intubated sedated on vent Head: Atraumatic, normal size  Eyes: No scleral icterus Neck: Supple,+ JVD Endocrine: No thryomegaly Cardiac: Normal S1, S2; RRR; no murmurs, rubs, or gallops Lungs: Diminished breath sounds bilaterally Abd: Soft, nontender, no hepatomegaly  Ext: 2+ pitting edema Musculoskeletal: No deformities Skin: Warm and dry, no rashes   Neuro: Intubated and sedated on vent  Labs  High Sensitivity Troponin:   Recent Labs  Lab 06/17/2020 2016 06/05/20 0234 06/05/20 1300  TROPONINIHS 12 818* 5,229*     Cardiac EnzymesNo results for input(s): TROPONINI in the  last 168 hours. No results for input(s): TROPIPOC in the last 168 hours.  Chemistry Recent Labs  Lab 06/08/20 0230 06/08/20 1609 06/09/20 0447 06/09/20 0447 06/09/20 1136 06/09/20 1511 06/10/20 0430  NA 132*   < > 134*   < > 136 135 133*  K 4.9   < > 4.2   < > 4.1 4.3 4.3  CL 100   < > 102  --   --  101 102  CO2 20*   < > 22  --   --  21* 20*  GLUCOSE 117*   < > 181*  --   --  168* 185*  BUN 22   < > 19  --   --  20 25*  CREATININE 2.80*   < > 1.91*  --   --  1.73* 1.67*  CALCIUM 6.5*   < > 7.2*  --   --  7.8* 7.6*  PROT 4.2*  --  4.7*  --   --   --  5.3*  ALBUMIN 1.6*  1.7*   < > 1.8*  1.8*  --   --  2.1* 2.0*  2.0*  AST 131*  --  4,444*  --   --   --  1,491*  ALT 78*  --  1,487*  --   --   --  1,225*  ALKPHOS 97  --  270*  --   --   --  467*  BILITOT 0.8  --  1.3*  --   --   --  1.2  GFRNONAA 22*   < > 35*  --   --  40* 41*  ANIONGAP 12   < > 10  --   --  13 11   < > = values in this interval not displayed.    Hematology Recent Labs  Lab 06/08/20 0230 06/08/20 0230 06/09/20 0447 06/09/20 1136 06/10/20 0430  WBC 15.6*  --  13.7*  --  20.5*  RBC 2.99*  --  3.28*  --  3.76*  HGB 9.3*   < > 10.2* 10.9* 11.6*  HCT 28.5*   < > 30.5* 32.0* 35.6*  MCV 95.3  --  93.0  --  94.7  MCH 31.1  --  31.1  --  30.9  MCHC 32.6  --  33.4  --  32.6  RDW 14.4  --  14.8  --  15.1  PLT 205  --  179  --  245   < > = values in this interval not displayed.   BNPNo results for input(s):  BNP, PROBNP in the last 168 hours.  DDimer No results for input(s): DDIMER in the last 168 hours.   Radiology  DG Chest Port 1 View  Result Date: 06/10/2020 CLINICAL DATA:  Respiratory failure. EXAM: PORTABLE CHEST 1 VIEW COMPARISON:  June 06, 2020. FINDINGS: Stable cardiomediastinal silhouette. Endotracheal and nasogastric tubes are unchanged in position. No pneumothorax is noted. Right basilar subsegmental atelectasis is noted. Left perihilar and basilar opacity is again noted concerning for  pneumonia or atelectasis. No significant pleural effusion is noted. Bony thorax is unremarkable. IMPRESSION: Stable support apparatus. Stable left perihilar and basilar opacity is noted concerning for pneumonia or atelectasis. Right basilar subsegmental atelectasis is noted. Electronically Signed   By: Marijo Conception M.D.   On: 06/10/2020 08:07    Cardiac Studies  TTE 06/05/2020 1. Left ventricular ejection fraction, by estimation, is 40%. The left  ventricle has normal function. The left ventricle demonstrates global  hypokinesis. Left ventricular diastolic parameters are indeterminate.  2. Right ventricular systolic function was not well visualized. The right  ventricular size is not well visualized.  3. The mitral valve is normal in structure. No evidence of mitral valve  regurgitation. No evidence of mitral stenosis.  4. The aortic valve is tricuspid. Aortic valve regurgitation is not  visualized. No aortic stenosis is present.  5. The inferior vena cava is normal in size with greater than 50%  respiratory variability, suggesting right atrial pressure of 3 mmHg.   Patient Profile  Samuel Gregory a 79 y.o.malewith CAD status post CABG,paroxysmal atrial flutter,metastatic prostate cancer,hypertension who was admitted to [facility] on 06/24/2020. He was found to have pancolitis and C. difficile. His course was complicated on 23/11/3612 with VF arrest.  Assessment & Plan   1. VF arrest/septic shock/severe C. difficile pancolitis/AKI on CRRT/acute liver injury/acute systolic heart failure -Admitted from [facility] with VF arrest. Found to have septic shock with severe C. difficile pancolitis. -His VF arrest is secondary to his severe septic shock. He has a known history of CAD status post CABG. Not unsurprising. -Amiodarone was held yesterday due to liver injury. No further ventricular arrhythmias. EF 40%. -Overall prognosis is guarded. He is on renal replacement therapy.  He has profound liver injury. There is no brain imaging. -We will continue supportive care for now. Restart amio when able. He is on heparin given history of atrial fibrillation as he had some of this while in the ICU. -Left heart catheterization would be considered once he is out of ICU and if he recovers.  2. Atrial fibrillation -Amnio has been on hold due to liver injury. On heparin. Has had atrial tachycardia see below.  3. Atrial tachycardia -He is having frequent episodes of a long RP tachycardia. The P wave axis clearly changes. It ends with a QRS. This is an atrial tachycardia. This is likely driven by sepsis and critical illness. Cannot be on a beta-blocker due to ongoing need for pressors. Liver injury is improving. Would restart amnio when able.  For questions or updates, please contact [place] Please consult [email or web address] for contact info under   Time Spent with Patient: I have spent a total of 25 minutes with patient reviewing hospital notes, telemetry, EKGs, labs and examining the patient as well as establishing an assessment and plan that was discussed with the patient.  > 50% of time was spent in direct patient care.    Signed, Addison Naegeli. Audie Box, [place]  06/10/2020 8:28  AM

## 2020-06-10 NOTE — Progress Notes (Signed)
[facility] NEPHROLOGY PROGRESS NOTE  Assessment/ Plan: Pt is a 79 y.o. yo male with CAD, atrial flutter, CKD, HTN, HLD, prostate cancer who present w/ C. difficile diarrhea, shock, cardiac arrest.  #Oliguric AKI on CKD stage III due to ATN after cardiac arrest/shock: Did not respond with IV diuretics and hemodynamic support.  Started CRRT on 11/7.  Tolerating UF with the help of pressors to maintain blood pressure.  He still looks volume up therefore continue ultrafiltration around 100-150 cc an hour.  Discussed with ICU nurse. Continue current CRRT prescription, all 4K.  On IV heparin. CT scan without hydronephrosis. Monitor urine output and labs.  #Acute hypoxic, hypercapnic respiratory failure requiring mechanical ventilation: PCCM is following.  #VF arrest/acute systolic heart failure: Cardiology is following.  Considering left heart cath when he is a stable.  #Septic shock due to C. difficile colitis: Antibiotics per primary team.  On pressors including Levophed and vasopressin.  Maintaining MAP more than 65.  #History of metastatic prostate cancer with bone metastasis and stable pancreatic mass.  # Non-anion gap metabolic acidosis associated with diarrhea.  Bicarb was as low as 12.    Improved now.  #Hyponatremia/hypochloremia: Likely related to free water retention in setting of AKI as well as isotonic fluid loss from diarrhea.  Now managed with CRRT.  # Hypocalcemia:  Very low albumin level.  Replete calcium.  #Hypophosphatemia: Repleted phosphorus.  Monitor lab.  #Shock liver.  Trend liver enzymes.  Subjective: Seen and examined ICU.  He tolerated UF up to 300 cc an hour yesterday now backing up to 150 cc an hour.  Blood pressure maintaining with pressures.  Review of system unable to obtain. Objective Vital signs in last 24 hours: Vitals:   06/10/20 0600 06/10/20 0615 06/10/20 0800 06/10/20 0811  BP:    132/65  Pulse: 93 93 89   Resp: 17 18 18    Temp:  98.6 F (37 C) 98.6 F (37 C) 98.4 F (36.9 C)   TempSrc:      SpO2: 97% 97% 96% 96%  Weight:      Height:       Weight change:   Intake/Output Summary (Last 24 hours) at 06/10/2020 0853 Last data filed at 06/10/2020 0800 Gross per 24 hour  Intake 3425.37 ml  Output 8663 ml  Net -5237.63 ml       Labs: Basic Metabolic Panel: Recent Labs  Lab 06/09/20 0447 06/09/20 0447 06/09/20 1136 06/09/20 1511 06/10/20 0430  NA 134*   < > 136 135 133*  K 4.2   < > 4.1 4.3 4.3  CL 102  --   --  101 102  CO2 22  --   --  21* 20*  GLUCOSE 181*  --   --  168* 185*  BUN 19  --   --  20 25*  CREATININE 1.91*  --   --  1.73* 1.67*  CALCIUM 7.2*  --   --  7.8* 7.6*  PHOS 2.2*  --   --  2.3* 3.0   < > = values in this interval not displayed.   Liver Function Tests: Recent Labs  Lab 06/08/20 0230 06/08/20 1609 06/09/20 0447 06/09/20 1511 06/10/20 0430  AST 131*  --  4,444*  --  1,491*  ALT 78*  --  1,487*  --  1,225*  ALKPHOS 97  --  270*  --  467*  BILITOT 0.8  --  1.3*  --  1.2  PROT 4.2*  --  4.7*  --  5.3*  ALBUMIN 1.6*  1.7*   < > 1.8*  1.8* 2.1* 2.0*  2.0*   < > = values in this interval not displayed.   No results for input(s): LIPASE, AMYLASE in the last 168 hours. No results for input(s): AMMONIA in the last 168 hours. CBC: Recent Labs  Lab 06/08/2020 2016 06/06/20 0407 06/06/20 1400 06/06/20 1400 06/07/20 0448 06/07/20 2210 06/08/20 0230 06/08/20 0230 06/09/20 0447 06/09/20 1136 06/10/20 0430  WBC 13.1*   < > 16.1*   < > 17.0*  --  15.6*  --  13.7*  --  20.5*  NEUTROABS 9.3*  --   --   --   --   --   --   --   --   --   --   HGB 13.2   < > 10.3*   < > 10.2*   < > 9.3*   < > 10.2* 10.9* 11.6*  HCT 40.9   < > 31.7*   < > 31.9*   < > 28.5*   < > 30.5* 32.0* 35.6*  MCV 99.5   < > 96.9  --  98.5  --  95.3  --  93.0  --  94.7  PLT 314   < > 223   < > 249  --  205  --  179  --  245   < > = values in this interval not displayed.   Cardiac Enzymes: No  results for input(s): CKTOTAL, CKMB, CKMBINDEX, TROPONINI in the last 168 hours. CBG: Recent Labs  Lab 06/09/20 1133 06/09/20 1551 06/09/20 2023 06/09/20 2358 06/10/20 0430  GLUCAP 170* 157* 202* 166* 172*    Iron Studies: No results for input(s): IRON, TIBC, TRANSFERRIN, FERRITIN in the last 72 hours. Studies/Results: DG Chest Port 1 View  Result Date: 06/10/2020 CLINICAL DATA:  Respiratory failure. EXAM: PORTABLE CHEST 1 VIEW COMPARISON:  June 06, 2020. FINDINGS: Stable cardiomediastinal silhouette. Endotracheal and nasogastric tubes are unchanged in position. No pneumothorax is noted. Right basilar subsegmental atelectasis is noted. Left perihilar and basilar opacity is again noted concerning for pneumonia or atelectasis. No significant pleural effusion is noted. Bony thorax is unremarkable. IMPRESSION: Stable support apparatus. Stable left perihilar and basilar opacity is noted concerning for pneumonia or atelectasis. Right basilar subsegmental atelectasis is noted. Electronically Signed   By: Marijo Conception M.D.   On: 06/10/2020 08:07    Medications: Infusions: .  prismasol BGK 4/2.5 400 mL/hr at 06/09/20 2130  .  prismasol BGK 4/2.5 200 mL/hr at 06/09/20 2157  . sodium chloride 5 mL/hr at 06/10/20 0800  . dextrose Stopped (06/07/20 2017)  . feeding supplement (VITAL AF 1.2 CAL) 1,000 mL (06/10/20 0804)  . fentaNYL infusion INTRAVENOUS 200 mcg/hr (06/10/20 0800)  . heparin 300 Units/hr (06/10/20 0800)  . metronidazole Stopped (06/10/20 0409)  . norepinephrine (LEVOPHED) Adult infusion 4 mcg/min (06/10/20 0800)  . prismasol BGK 4/2.5 1,600 mL/hr at 06/10/20 0743  . vasopressin 0.03 Units/min (06/10/20 0800)    Scheduled Medications: . artificial tears   Both Eyes BID  . aspirin  162 mg Per Tube Daily  . B-complex with vitamin C  1 tablet Per Tube Daily  . chlorhexidine gluconate (MEDLINE KIT)  15 mL Mouth Rinse BID  . Chlorhexidine Gluconate Cloth  6 each Topical Daily   . feeding supplement (PROSource TF)  45 mL Per Tube TID  . hydrocortisone sod succinate (SOLU-CORTEF) inj  50 mg Intravenous Q6H  .  insulin aspart  1-3 Units Subcutaneous Q4H  . insulin aspart  3 Units Subcutaneous Q4H  . mouth rinse  15 mL Mouth Rinse 10 times per day  . pantoprazole (PROTONIX) IV  40 mg Intravenous Q24H  . sodium chloride flush  10-40 mL Intracatheter Q12H  . vancomycin  500 mg Per Tube Q6H    have reviewed scheduled and prn medications.  Physical Exam: General: Intubated, sedated, Heart:RRR, s1s2 nl Lungs: Coarse breath sound bilateral, Abdomen: Soft, nondistended Extremities: Has anasarca with pitting edema, not much improvement Dialysis Access: Groin catheter.  Analeise Mccleery Prasad Amilia Vandenbrink 06/10/2020,8:53 AM  LOS: 6 days  Pager: [phone]

## 2020-06-10 NOTE — Progress Notes (Signed)
NAME:  Samuel Gregory, MRN:  [id number], DOB:  [birth year], LOS: 6 ADMISSION DATE:  06/10/2020, CONSULTATION DATE:  11/5 REFERRING MD:  Tat/Triad, CHIEF COMPLAINT:  Shock/ resp failure from [facility]  Brief History   89 yowm never smoker with h/o prostate ca and prior c diff infection admitted 11/4 with voluminous diarrhea and abd pain and coded in ER   VF arrest > admit to ICU with pancolitis on admit CT / vent dep/ pressor dep and PCCM consulted am 11/5   History of present illness   79 y.o. male with past medical history of atrial fibrillation on Eliquis, coronary artery disease status post CABG in 2000, hypertension, hyperlipidemia, GERD, HLD,metastatic prostate cancer and history of C. difficile infection x1, who presented for evaluation of acute onset of diarrhea and arrested as w/u in progress.   Per his daugher he was ambulatory prior to acute illness   Past Medical History    Past Medical History:  Diagnosis Date  . Arthritis   . Atrial flutter ([place])    a. diagnosed in 12/2019 while admitted for sepsis  . Collagen vascular disease ([place])   . Coronary atherosclerosis of native coronary artery    a. s/p CABG in 2000 b. NST in 10/2017 showing mild to moderate per-infarct ischemia with medical management recommended  . Essential hypertension   . Hyperlipidemia   . Prostate cancer ([place])      Significant Hospital Events   VF arrest pm 11/4 > amiodarone drip 11/5 shock due to pancolitis  Consults:  PCCM 11/5  Urology 11/5 Cards   11/5   Procedures:  ET  11/5 >>> R IJ  11/5 >>> R Rad Art line 11/5 >>>  L fem art line 11/7 >  L fem HD cath 11/7 >   Significant Diagnostic Tests:   CT abd 11/4 1. Marked pancolitis that is worse of the rectosigmoid colon. Etiology could be infectious, inflammatory, ischemic. No associated bowel obstruction or perforation. Correlate with stool cultures. 2. Other imaging findings of potential clinical significance: Cholelithiasis with no acute  cholecystitis. 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 Small right inguinal hernia containing fat and fluid. Aortic Atherosclerosis  Micro Data:  COVID   19  PCR  11/4 Neg  Flu A/B    PCR   11/4  Neg  Stool 11/4 neg C diff antigen/ toxix  11/4  Both POS BC x 2  11/4 >>> MRSA PCR 11/5 neg   Antimicrobials:  Rocephin  11/4  Cipro  11/4  Flagyl  11/4  >>> Zosyn  11/4  >>> Vanc per rectalog  tube11/5  >>>  Interim history/subjective:  Tolerating CRRT well but currently even due to tachycardia overnight. -5.7L past 24 hours, Net +10L. On 72mcg/min levo + 0.03u/min vaso.  Objective   Blood pressure 132/65, pulse (!) 135, temperature (!) 95.7 F (35.4 C), resp. rate 19, height 5\' 6"  (1.676 m), weight 85.9 kg, SpO2 95 %. CVP:  [6 mmHg-18 mmHg] 11 mmHg  Vent Mode: PRVC FiO2 (%):  [40 %] 40 % Set Rate:  [18 bmp] 18 bmp Vt Set:  [510 mL] 510 mL PEEP:  [5 cmH20] 5 cmH20 Plateau Pressure:  [20 cmH20-24 cmH20] 20 cmH20   Intake/Output Summary (Last 24 hours) at 06/10/2020 0948 Last data filed at 06/10/2020 0900 Gross per 24 hour  Intake 3437.4 ml  Output 8485 ml  Net -5047.6 ml   Filed Weights   06/07/20 0455 06/07/20 1245 06/08/20 0600  Weight: 89.5 kg 89.5 kg 85.9 kg   CVP:  [6 mmHg-18 mmHg] 11 mmHg   Examination: General: Chronically ill appearing elderly male, in NAD HENT: [place] / AT.  ETT in place Lungs: Normal effort.  CTAB Cardiovascular: RRR, no M/R/G Abdomen: markedly distended, soft Extremities: Severe generalized edema particular of the scrotum, improving gradually Neuro: Sedated, not following commands  Assessment & Plan:   Critically ill due to acute hypoxemic and hypercarbic respiratory failure requiring mechanical ventilation - Continue full vent support - Wean as able but hold SBT until has more fluid off, hopefully can start attempting in next 24 - 48 hours - Aggressive fluid removal per CRRT - Follow CXR  Critically ill  due to septic shock secondary to C. difficile colitis requiring titration of vasopressors. - Continue low dose levo and vaso to support BP and CRRT - Continue stress steroids - Goal MAP > 65  C. difficile pancolitis. - Continue vancomycin and flagyl  Acute kidney injury with anuria - no response to diuretics; therefore, started on CRRT 11/7 after being net +21.9L since admit.  Responding very well with -5.7L over past 24 hours. NAGMA - 2/2 diarrhea.  Hypocalcemia - being repleted. - CRRT per nephrology. - 4 doses albumin to help with oncotic pressures and fluid shifts  Transaminitis - presumed 2/2 shock + hepatic congestion in setting profound volume overload; however, possibly exacerbated by amiodarone. - Supportive care. - Resume amio today with no bolus (he has had intermittent A.fib RVR overnight). - Trend LFT's.  VF arrest - s/p defib x 1 and now on amio. A.fib - intermittent.  Currently in NSR. - Resume amio as above. - Cards considering cath if and when he is stabilized. - Continue heparin gtt.  Metastatic prostate cancer with bone metastases, these appears stable Stable pancreatic mass. - Outpatient follow up.  Best practice:  Diet: TF Pain/Anxiety/Delirium protocol (if indicated): sedated on fent/ versed prn VAP protocol (if indicated): Bundle in place DVT prophylaxis: Unfractioned heparin GI prophylaxis: PPI Glucose control: Phase 1 glycemic control Mobility: bed rest Code Status: Full code Family Communication: Will try and call daughter Disposition: ICU   CC time: 35 min.   Montey Hora, [facility] 06/10/2020, 9:48 AM

## 2020-06-10 NOTE — Progress Notes (Signed)
[facility] Progress Note Patient Name: Samuel Gregory DOB: [birth year] MRN: [id number]   Date of Service  06/10/2020  HPI/Events of Note  Narrow complex tachycardia with rate of  139 , no recent 12 lead EKG.  eICU Interventions  12 lead EKG ordered.        Kerry Kass Kenyada Hy 06/10/2020, 5:25 AM

## 2020-06-11 DIAGNOSIS — I4819 Other persistent atrial fibrillation: Secondary | ICD-10-CM | POA: Diagnosis not present

## 2020-06-11 DIAGNOSIS — I471 Supraventricular tachycardia: Secondary | ICD-10-CM | POA: Diagnosis not present

## 2020-06-11 LAB — RENAL FUNCTION PANEL
Albumin: 2.8 g/dL — ABNORMAL LOW (ref 3.5–5.0)
Albumin: 3 g/dL — ABNORMAL LOW (ref 3.5–5.0)
Anion gap: 11 (ref 5–15)
Anion gap: 13 (ref 5–15)
BUN: 32 mg/dL — ABNORMAL HIGH (ref 8–23)
BUN: 37 mg/dL — ABNORMAL HIGH (ref 8–23)
CO2: 22 mmol/L (ref 22–32)
CO2: 19 mmol/L — ABNORMAL LOW (ref 22–32)
Calcium: 8.1 mg/dL — ABNORMAL LOW (ref 8.9–10.3)
Calcium: 8.1 mg/dL — ABNORMAL LOW (ref 8.9–10.3)
Chloride: 104 mmol/L (ref 98–111)
Chloride: 102 mmol/L (ref 98–111)
Creatinine, Ser: 1.55 mg/dL — ABNORMAL HIGH (ref 0.61–1.24)
Creatinine, Ser: 1.53 mg/dL — ABNORMAL HIGH (ref 0.61–1.24)
GFR, Estimated: 45 mL/min — ABNORMAL LOW (ref >60)
GFR, Estimated: 46 mL/min — ABNORMAL LOW (ref >60)
Glucose, Bld: 217 mg/dL — ABNORMAL HIGH (ref 70–99)
Glucose, Bld: 197 mg/dL — ABNORMAL HIGH (ref 70–99)
Phosphorus: 2.5 mg/dL (ref 2.5–4.6)
Phosphorus: 3.3 mg/dL (ref 2.5–4.6)
Potassium: 4 mmol/L (ref 3.5–5.1)
Potassium: 4.4 mmol/L (ref 3.5–5.1)
Sodium: 137 mmol/L (ref 135–145)
Sodium: 134 mmol/L — ABNORMAL LOW (ref 135–145)

## 2020-06-11 LAB — HEPATIC FUNCTION PANEL
ALT: 708 U/L — ABNORMAL HIGH (ref 0–44)
AST: 351 U/L — ABNORMAL HIGH (ref 15–41)
Albumin: 2.8 g/dL — ABNORMAL LOW (ref 3.5–5.0)
Alkaline Phosphatase: 494 U/L — ABNORMAL HIGH (ref 38–126)
Bilirubin, Direct: 0.5 mg/dL — ABNORMAL HIGH (ref 0.0–0.2)
Indirect Bilirubin: 0.8 mg/dL (ref 0.3–0.9)
Total Bilirubin: 1.3 mg/dL — ABNORMAL HIGH (ref 0.3–1.2)
Total Protein: 5.6 g/dL — ABNORMAL LOW (ref 6.5–8.1)

## 2020-06-11 LAB — CBC
HCT: 34.2 % — ABNORMAL LOW (ref 39.0–52.0)
Hemoglobin: 11.2 g/dL — ABNORMAL LOW (ref 13.0–17.0)
MCH: 31 pg (ref 26.0–34.0)
MCHC: 32.7 g/dL (ref 30.0–36.0)
MCV: 94.7 fL (ref 80.0–100.0)
Platelets: 264 10*3/uL (ref 150–400)
RBC: 3.61 MIL/uL — ABNORMAL LOW (ref 4.22–5.81)
RDW: 15.5 % (ref 11.5–15.5)
WBC: 29.6 10*3/uL — ABNORMAL HIGH (ref 4.0–10.5)
nRBC: 12.4 % — ABNORMAL HIGH (ref 0.0–0.2)

## 2020-06-11 LAB — GLUCOSE, CAPILLARY
Glucose-Capillary: 141 mg/dL — ABNORMAL HIGH (ref 70–99)
Glucose-Capillary: 156 mg/dL — ABNORMAL HIGH (ref 70–99)
Glucose-Capillary: 186 mg/dL — ABNORMAL HIGH (ref 70–99)
Glucose-Capillary: 192 mg/dL — ABNORMAL HIGH (ref 70–99)
Glucose-Capillary: 192 mg/dL — ABNORMAL HIGH (ref 70–99)
Glucose-Capillary: 206 mg/dL — ABNORMAL HIGH (ref 70–99)
Glucose-Capillary: 228 mg/dL — ABNORMAL HIGH (ref 70–99)
Glucose-Capillary: 240 mg/dL — ABNORMAL HIGH (ref 70–99)

## 2020-06-11 LAB — HEPARIN LEVEL (UNFRACTIONATED): Heparin Unfractionated: 0.38 IU/mL (ref 0.30–0.70)

## 2020-06-11 MED ORDER — METRONIDAZOLE 50 MG/ML ORAL SUSPENSION
500.0000 mg | Freq: Three times a day (TID) | ORAL | Status: AC
  Administered 2020-06-11 – 2020-06-13 (×8): 500 mg
  Filled 2020-06-11 (×9): qty 10

## 2020-06-11 MED ORDER — VANCOMYCIN 50 MG/ML ORAL SOLUTION: 125.0000 mg | ORAL | Status: DC

## 2020-06-11 MED ORDER — VANCOMYCIN 50 MG/ML ORAL SOLUTION: 125.0000 mg | Freq: Every day | ORAL | Status: DC

## 2020-06-11 MED ORDER — DEXMEDETOMIDINE HCL IN NACL 400 MCG/100ML IV SOLN
0.4000 ug/kg/h | INTRAVENOUS | Status: DC
  Administered 2020-06-11: 1.2 ug/kg/h via INTRAVENOUS
  Administered 2020-06-12: 0.1 ug/kg/h via INTRAVENOUS
  Filled 2020-06-11 (×3): qty 100

## 2020-06-11 MED ORDER — VANCOMYCIN 50 MG/ML ORAL SOLUTION: 125.0000 mg | Freq: Two times a day (BID) | ORAL | Status: DC

## 2020-06-11 MED ORDER — VANCOMYCIN 50 MG/ML ORAL SOLUTION
125.0000 mg | Freq: Four times a day (QID) | ORAL | Status: DC
  Administered 2020-06-11 – 2020-06-14 (×12): 125 mg
  Filled 2020-06-11 (×16): qty 2.5

## 2020-06-11 MED ORDER — POTASSIUM PHOSPHATES 15 MMOLE/5ML IV SOLN
20.0000 mmol | Freq: Once | INTRAVENOUS | Status: AC
  Administered 2020-06-11: 20 mmol via INTRAVENOUS
  Filled 2020-06-11: qty 6.67

## 2020-06-11 MED ORDER — FAMOTIDINE IN NACL 20-0.9 MG/50ML-% IV SOLN
20.0000 mg | INTRAVENOUS | Status: DC
  Administered 2020-06-12 – 2020-06-14 (×3): 20 mg via INTRAVENOUS
  Filled 2020-06-11 (×3): qty 50

## 2020-06-11 NOTE — Consult Note (Signed)
[place] for Heparin Indication: atrial fibrillation  Allergies  Allergen Reactions  . Latex Rash  . Sulfa Antibiotics Rash    Patient Measurements: Height: 5\' 6"  (167.6 cm) Weight: 76.4 kg (168 lb 6.9 oz) IBW/kg (Calculated) : 63.8 Heparin Dosing Weight: 82.7 kg  Vital Signs: Temp: 96.8 F (36 C) (11/11 0734) Temp Source: Esophageal (11/11 0400) BP: 147/71 (11/11 0330) Pulse Rate: 81 (11/11 0734)  Labs: Recent Labs    06/08/20 2005 06/08/20 2005 06/09/20 0447 06/09/20 0447 06/09/20 1136 06/09/20 1136 06/09/20 1511 06/10/20 0255 06/10/20 0430 06/10/20 1535 06/11/20 0416  HGB  --   --  10.2*   < > 10.9*   < >  --   --  11.6*  --  11.2*  HCT  --   --  30.5*   < > 32.0*  --   --   --  35.6*  --  34.2*  PLT  --   --  179  --   --   --   --   --  245  --  264  APTT >200*  --   --   --   --   --   --   --   --   --   --   HEPARINUNFRC 1.10*   < > 1.24*  --   --   --    < > 0.94*  --  0.58 0.38  CREATININE  --    < > 1.91*  --   --    < >   < >  --  1.67* 1.63* 1.55*   < > = values in this interval not displayed.    Estimated Creatinine Clearance: 34.9 mL/min (A) (by C-G formula based on SCr of 1.55 mg/dL (H)).  Assessment: 79 y.o. male with h/o Afib on apixaban prior to admit. Patient transitioned over to heparin.   Patient has been supratherapeutic on higher doses of heparin but HL this morning is 0.38 which is therapeutic on 300units/hr x2. No bleeding issues noted and CBC ok. HL is trending down so monitor closely for need to adjust to remain therapeutic.   Goal of Therapy:  Heparin level 0.3-0.7 units/mL Monitor platelets by anticoagulation protocol: Yes   Plan:  Continue Heparin at 300 units/hr Heparin level daily  F/u for s/sx of bleed and longterm AC plan once stable  Cephus Slater, PharmD, [facility] Pharmacy Resident [phone] 06/11/2020 7:47 AM

## 2020-06-11 NOTE — Progress Notes (Addendum)
NAME:  Samuel Gregory, MRN:  [id number], DOB:  [birth year], LOS: 7 ADMISSION DATE:  06/07/2020, CONSULTATION DATE:  11/5 REFERRING MD:  Tat/Triad, CHIEF COMPLAINT:  Shock/ resp failure from [facility]  Brief History   42 yowm never smoker with h/o prostate ca and prior c diff infection admitted 11/4 with voluminous diarrhea and abd pain and coded in ER   VF arrest > admit to ICU with pancolitis on admit CT / vent dep/ pressor dep and PCCM consulted am 11/5   History of present illness   79 y.o. male with past medical history of atrial fibrillation on Eliquis, coronary artery disease status post CABG in 2000, hypertension, hyperlipidemia, GERD, HLD,metastatic prostate cancer and history of C. difficile infection x1, who presented for evaluation of acute onset of diarrhea and arrested as w/u in progress.   Per his daugher he was ambulatory prior to acute illness   Past Medical History    Past Medical History:  Diagnosis Date  . Arthritis   . Atrial flutter ([place])    a. diagnosed in 12/2019 while admitted for sepsis  . Collagen vascular disease ([place])   . Coronary atherosclerosis of native coronary artery    a. s/p CABG in 2000 b. NST in 10/2017 showing mild to moderate per-infarct ischemia with medical management recommended  . Essential hypertension   . Hyperlipidemia   . Prostate cancer ([place])      Significant Hospital Events   VF arrest pm 11/4 > amiodarone drip 11/5 shock due to pancolitis  Consults:  PCCM 11/5  Urology 11/5 Cards   11/5   Procedures:  ET  11/5 >>> R IJ  11/5 >>> R Rad Art line 11/5 >>>  L fem art line 11/7 >  L fem HD cath 11/7 >   Significant Diagnostic Tests:  CT abd 11/4 1 > Marked pancolitis that is worse of the rectosigmoid colon. Etiology could be infectious, inflammatory, ischemic. No associated bowel obstruction or perforation. Correlate with stool cultures. Other imaging findings of potential clinical significance: Cholelithiasis with no acute  cholecystitis. 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 Small right inguinal hernia containing fat and fluid. Aortic Atherosclerosis  Micro Data:  COVID 19  PCR > 11/4 Neg  Flu A/B  PCR  > 11/4  Neg  Stool 11/4 > neg  C diff antigen/ toxix 11/4 > Both POS BC x 2  11/4 > MRSA PCR 11/5 > neg   Antimicrobials:  Rocephin  11/4  Cipro  11/4  Flagyl  11/4  >>> Zosyn  11/4  >>> Vanc per rectalog  tube11/5  >>>  Interim history/subjective:  No acute events overnight  Tolerating CRRT well -7L in the last 24hrs Remains on low dose levo  Objective   Blood pressure (!) 147/71, pulse 87, temperature 97.9 F (36.6 C), resp. rate 18, height 5\' 6"  (1.676 m), weight 76.4 kg, SpO2 99 %. CVP:  [6 mmHg-12 mmHg] 7 mmHg  Vent Mode: PRVC FiO2 (%):  [40 %] 40 % Set Rate:  [18 bmp] 18 bmp Vt Set:  [510 mL] 510 mL PEEP:  [5 cmH20] 5 cmH20 Plateau Pressure:  [20 cmH20-24 cmH20] 22 cmH20   Intake/Output Summary (Last 24 hours) at 06/11/2020 0705 Last data filed at 06/11/2020 0700 Gross per 24 hour  Intake 3055.54 ml  Output 6948 ml  Net -3892.46 ml   Filed Weights   06/07/20 1245 06/08/20 0600 06/10/20 0500  Weight: 89.5 kg 85.9 kg 76.4 kg  CVP:  [6 mmHg-12 mmHg] 7 mmHg   Examination: General: Chronically ill appearing elderly male lying in bed on mechanical ventilation, in NAD HEENT: ETT, MM pink/moist, PERRL, blood seen in lower aspect of sclera  Neuro: Sedated on vent, flicker of movement seen in extremities to pain  CV: s1s2 regular rate and rhythm, no murmur, rubs, or gallops,  PULM:  Clear to ascultation bilaterally, breathing fully with vent on 289mcg of fent, no accessory muscle use seen  GI: soft, bowel sounds active in all 4 quadrants, non-tender, non-distended, tolerating TF Extremities: warm/dry, 2+ pitting lower extremity edema extending into scrotum, improving  Skin: no rashes or lesions  Assessment & Plan:   Critically ill due to acute  hypoxemic and hypercarbic respiratory failure requiring mechanical ventilation P: Continue ventilator support with lung protective strategies  Need to wean sedation to facilitate SBT Begin SBT trials now that significant volume has ben removed  Wean PEEP and FiO2 for sats greater than 90%. Head of bed elevated 30 degrees. Plateau pressures less than 30 cm H20.  Follow intermittent chest x-ray and ABG.   Ensure adequate pulmonary hygiene  VAP bundle in place  PAD protocol Continue volume removal per CRRT as below   Septic shock secondary to C. difficile colitis requiring titration of vasopressors. P: WBC up trending MAP goal > 65 Continue pressure support as needed  Continue stress dose steroids, wean as pressor requirement decreases  C. difficile pancolitis -CT on admission significant for marked pancolitis  P: Continue vancomycin and flagyl  Will discuss with attending need for possible repeat imaging  VF arrest  - s/p defib x 1 and now on amio. A.fib - intermittent -Currently in NSR P: Cardiology following  Continuous telemetry  Continue Amiodarone and heparin drip  Cardiology to consider cath once stabilized   Acute kidney injury with anuria  - no response to diuretics; therefore, started on CRRT 11/7 after being net +21.9L since admit.  Responding very well with -5.7L over past 24 hours. NAGMA  - 2/2 diarrhea, resolved  Hypocalcemia  - being repleted, resolved  P: Follow renal function / urine output Trend Bmet Avoid nephrotoxins, ensure adequate renal perfusion  IV hydration Tolerating significant volume removal with CRRT   Transaminitis  - presumed 2/2 shock + hepatic congestion in setting profound volume overload; however, possibly exacerbated by amiodarone P: LFT's downtrending  Supportive care  Avoid nephrotoxins  Continue to trend LFTs intermittently  Metastatic prostate cancer with bone metastases Stable pancreatic mass -These appears  stable P: Outpatient follow up   Best practice:  Diet: TF Pain/Anxiety/Delirium protocol (if indicated): sedated on fent/ versed prn VAP protocol (if indicated): Bundle in place DVT prophylaxis: Unfractioned heparin GI prophylaxis: PPI Glucose control: Phase 1 glycemic control Mobility: bed rest Code Status: Full code Family Communication: Update family daily  Disposition: ICU  CRITICAL CARE Performed by: Johnsie Cancel  Total critical care time: .38 minutes  Critical care time was exclusive of separately billable procedures and treating other patients.  Critical care was necessary to treat or prevent imminent or life-threatening deterioration.  Critical care was time spent personally by me on the following activities: development of treatment plan with patient and/or surrogate as well as nursing, discussions with consultants, evaluation of patient's response to treatment, examination of patient, obtaining history from patient or surrogate, ordering and performing treatments and interventions, ordering and review of laboratory studies, ordering and review of radiographic studies, pulse oximetry and re-evaluation of patient's condition.   Sequan Auxier  Julaine Fusi, NP-C [facility] Contact / Pager information can be found on Amion  06/11/2020, 7:21 AM

## 2020-06-11 NOTE — Progress Notes (Signed)
Samuel Gregory  Assessment/ Plan: Pt is a 79 y.o. yo male with CAD, atrial flutter, CKD, HTN, HLD, prostate cancer who present w/ C. difficile diarrhea, shock, cardiac arrest.  #Oliguric AKI on CKD stage III due to ATN after cardiac arrest/shock: Did not respond with IV diuretics and hemodynamic support.  Started CRRT on 11/7.  UF around 200 cc an hour, tolerating well with the help of pressors to maintain BP.  He still looks volume up therefore continue ultrafiltration. Continue current CRRT prescription, all 4K.  On IV heparin. CT scan without hydronephrosis. Monitor urine output and labs.  #Acute hypoxic, hypercapnic respiratory failure requiring mechanical ventilation: PCCM is following.  #VF arrest/acute systolic heart failure: Cardiology is following.  Considering left heart cath when he is a stable.  #Septic shock due to C. difficile colitis: Antibiotics per primary team.  On pressors including Levophed and vasopressin.  Maintaining MAP more than 65.  #History of metastatic prostate cancer with bone metastasis and stable pancreatic mass.  # Non-anion gap metabolic acidosis associated with diarrhea.  Bicarb was as low as 12.    Improved now.  #Hyponatremia/hypochloremia: Likely related to free water retention in setting of AKI as well as isotonic fluid loss from diarrhea.  Now managed with CRRT.  # Hypocalcemia:  Very low albumin level.  Replete calcium.  #Hypophosphatemia: Repleted phosphorus today.  Monitor lab.  #Shock liver.  Trend liver enzymes.  Subjective: Seen and examined ICU.  No new event.  Tolerating UF, CRRT well.  Remains anuric.  Objective Vital signs in last 24 hours: Vitals:   06/11/20 0600 06/11/20 0645 06/11/20 0700 06/11/20 0734  BP:      Pulse:  87 85 81  Resp:  _0 Temp:  (!) 97.2 F (36.2 C) (!) 96.3 F (35.7 C) (!) 96.8 F (36 C)  TempSrc:      SpO2:  100% 100% 100%  Weight: 65.5 kg     Height:        Weight change: -10.9 kg  Intake/Output Summary (Last 24 hours) at 06/11/2020 0851 Last data filed at 06/11/2020 0800 Gross per 24 hour  Intake 3359.61 ml  Output 8271 ml  Net -4911.39 ml       Labs: Basic Metabolic Panel: Recent Labs  Lab 06/10/20 0430 06/10/20 1535 06/11/20 0416  NA 133* 135 137  K 4.3 4.3 4.0  CL 102 102 104  CO2 20* 21* 22  GLUCOSE 185* 173* 217*  BUN 25* 28* 32*  CREATININE 1.67* 1.63* 1.55*  CALCIUM 7.6* 8.3* 8.1*  PHOS 3.0 2.7 2.5   Liver Function Tests: Recent Labs  Lab 06/09/20 0447 06/09/20 1511 06/10/20 0430 06/10/20 1535 06/11/20 0416  AST 4,444*  --  1,491*  --  351*  ALT 1,487*  --  1,225*  --  708*  ALKPHOS 270*  --  467*  --  494*  BILITOT 1.3*  --  1.2  --  1.3*  PROT 4.7*  --  5.3*  --  5.6*  ALBUMIN 1.8*  1.8*   < > 2.0*  2.0* 2.2* 2.8*  2.8*   < > = values in this interval not displayed.   No results for input(s): LIPASE, AMYLASE in the last 168 hours. No results for input(s): AMMONIA in the last 168 hours. CBC: Recent Labs  Lab 06/28/2020 2016 06/06/20 0407 06/07/20 0448 06/07/20 2210 06/08/20 0230 06/08/20 0230 06/09/20 2122 06/09/20 0447 06/09/20 1136 06/10/20 0430 06/11/20 4825  WBC 13.1*   < > 17.0*  --  15.6*   < > 13.7*  --   --  20.5* 29.6*  NEUTROABS 9.3*  --   --   --   --   --   --   --   --   --   --   HGB 13.2   < > 10.2*   < > 9.3*   < > 10.2*   < > 10.9* 11.6* 11.2*  HCT 40.9   < > 31.9*   < > 28.5*   < > 30.5*   < > 32.0* 35.6* 34.2*  MCV 99.5   < > 98.5  --  95.3  --  93.0  --   --  94.7 94.7  PLT 314   < > 249  --  205   < > 179  --   --  245 264   < > = values in this interval not displayed.   Cardiac Enzymes: No results for input(s): CKTOTAL, CKMB, CKMBINDEX, TROPONINI in the last 168 hours. CBG: Recent Labs  Lab 06/10/20 0430 06/10/20 0813 06/10/20 1214 06/10/20 1534 06/11/20 0755  GLUCAP 172* 150* 164* 163* 141*    Iron Studies: No results for input(s): IRON, TIBC,  TRANSFERRIN, FERRITIN in the last 72 hours. Studies/Results: DG Chest Port 1 View  Result Date: 06/10/2020 CLINICAL DATA:  Respiratory failure. EXAM: PORTABLE CHEST 1 VIEW COMPARISON:  June 06, 2020. FINDINGS: Stable cardiomediastinal silhouette. Endotracheal and nasogastric tubes are unchanged in position. No pneumothorax is noted. Right basilar subsegmental atelectasis is noted. Left perihilar and basilar opacity is again noted concerning for pneumonia or atelectasis. No significant pleural effusion is noted. Bony thorax is unremarkable. IMPRESSION: Stable support apparatus. Stable left perihilar and basilar opacity is noted concerning for pneumonia or atelectasis. Right basilar subsegmental atelectasis is noted. Electronically Signed   By: Marijo Conception M.D.   On: 06/10/2020 08:07    Medications: Infusions: .  prismasol BGK 4/2.5 400 mL/hr at 06/11/20 0000  .  prismasol BGK 4/2.5 200 mL/hr at 06/11/20 0030  . sodium chloride 5 mL/hr at 06/10/20 1900  . amiodarone 30 mg/hr (06/11/20 0800)  . dexmedetomidine (PRECEDEX) IV infusion 1.2 mcg/kg/hr (06/11/20 0841)  . dextrose Stopped (06/07/20 2017)  . feeding supplement (VITAL AF 1.2 CAL) 1,000 mL (06/11/20 0312)  . fentaNYL infusion INTRAVENOUS 200 mcg/hr (06/11/20 0800)  . heparin 300 Units/hr (06/11/20 0800)  . metronidazole 500 mg (06/11/20 0828)  . norepinephrine (LEVOPHED) Adult infusion 4 mcg/min (06/11/20 0800)  . potassium PHOSPHATE IVPB (in mmol)    . prismasol BGK 4/2.5 1,600 mL/hr at 06/11/20 0700  . vasopressin 0.03 Units/min (06/11/20 0800)    Scheduled Medications: . artificial tears   Both Eyes BID  . aspirin  162 mg Per Tube Daily  . B-complex with vitamin C  1 tablet Per Tube Daily  . chlorhexidine gluconate (MEDLINE KIT)  15 mL Mouth Rinse BID  . Chlorhexidine Gluconate Cloth  6 each Topical Daily  . feeding supplement (PROSource TF)  45 mL Per Tube TID  . hydrocortisone sod succinate (SOLU-CORTEF) inj  50 mg  Intravenous Q6H  . insulin aspart  1-3 Units Subcutaneous Q4H  . insulin aspart  3 Units Subcutaneous Q4H  . mouth rinse  15 mL Mouth Rinse 10 times per day  . pantoprazole (PROTONIX) IV  40 mg Intravenous Q24H  . sodium chloride flush  10-40 mL Intracatheter Q12H  . vancomycin  500 mg  Per Tube Q6H    have reviewed scheduled and prn medications.  Physical Exam: General: Intubated, sedated, unresponsive Heart:RRR, s1s2 nl Lungs: Coarse breath sound bilateral, Abdomen: Soft, nondistended Extremities: Has anasarca with pitting edema, not much improvement Dialysis Access: Groin catheter.  Samuel Gregory 06/11/2020,8:51 AM  LOS: 7 days  Pager: [phone]

## 2020-06-11 NOTE — Progress Notes (Signed)
Cardiology Progress Note  Patient ID: Samuel Gregory MRN: [id number] DOB: 09-19-1940 Date of Encounter: 06/11/2020  Primary Cardiologist: Rozann Lesches, MD  Subjective   Chief Complaint: None.  HPI: Intubated and sedated on vent.  Remains on CRRT.  Remains on pressors.  ROS:  All other ROS reviewed and negative. Pertinent positives noted in the HPI.     Inpatient Medications  Scheduled Meds: . artificial tears   Both Eyes BID  . aspirin  162 mg Per Tube Daily  . B-complex with vitamin C  1 tablet Per Tube Daily  . chlorhexidine gluconate (MEDLINE KIT)  15 mL Mouth Rinse BID  . Chlorhexidine Gluconate Cloth  6 each Topical Daily  . feeding supplement (PROSource TF)  45 mL Per Tube TID  . hydrocortisone sod succinate (SOLU-CORTEF) inj  50 mg Intravenous Q6H  . insulin aspart  1-3 Units Subcutaneous Q4H  . insulin aspart  3 Units Subcutaneous Q4H  . mouth rinse  15 mL Mouth Rinse 10 times per day  . metroNIDAZOLE  500 mg Per Tube Q8H  . sodium chloride flush  10-40 mL Intracatheter Q12H  . vancomycin  125 mg Per Tube QID   Followed by  . [START ON 06/25/2020] vancomycin  125 mg Per Tube BID   Followed by  . [START ON 07/03/2020] vancomycin  125 mg Per Tube Daily   Followed by  . [START ON 07/10/2020] vancomycin  125 mg Per Tube QODAY   Followed by  . [START ON 07/18/2020] vancomycin  125 mg Per Tube Q3 days   Continuous Infusions: .  prismasol BGK 4/2.5 400 mL/hr at 06/11/20 0000  .  prismasol BGK 4/2.5 200 mL/hr at 06/11/20 0030  . sodium chloride 5 mL/hr at 06/10/20 1900  . amiodarone 30 mg/hr (06/11/20 1215)  . dexmedetomidine (PRECEDEX) IV infusion 0.5 mcg/kg/hr (06/11/20 1000)  . dextrose Stopped (06/07/20 2017)  . [START ON 06/12/2020] famotidine (PEPCID) IV    . feeding supplement (VITAL AF 1.2 CAL) 1,000 mL (06/11/20 0312)  . fentaNYL infusion INTRAVENOUS 50 mcg/hr (06/11/20 1000)  . heparin 300 Units/hr (06/11/20 1000)  . norepinephrine (LEVOPHED) Adult  infusion 6 mcg/min (06/11/20 1000)  . potassium PHOSPHATE IVPB (in mmol) 20 mmol (06/11/20 1007)  . prismasol BGK 4/2.5 1,600 mL/hr at 06/11/20 0700  . vasopressin 0.03 Units/min (06/11/20 1000)   PRN Meds: alteplase, heparin, sodium chloride, sodium chloride flush   Vital Signs   Vitals:   06/11/20 0734 06/11/20 1110 06/11/20 1112 06/11/20 1114  BP:      Pulse: 81  87   Resp: 18  19   Temp: (!) 96.8 F (36 C)  (!) 97.3 F (36.3 C)   TempSrc:      SpO2: 100% 100% 100% 100%  Weight:      Height:        Intake/Output Summary (Last 24 hours) at 06/11/2020 1216 Last data filed at 06/11/2020 1000 Gross per 24 hour  Intake 3401.7 ml  Output 8342 ml  Net -4940.3 ml   Last 3 Weights 06/11/2020 06/10/2020 06/08/2020  Weight (lbs) 144 lb 6.4 oz 168 lb 6.9 oz 189 lb 6 oz  Weight (kg) 65.5 kg 76.4 kg 85.9 kg      Telemetry  Overnight telemetry shows normal sinus rhythm with heart rate in the 80s, which I personally reviewed.   ECG  The most recent ECG shows atrial tachycardia, heart rate 138, which I personally reviewed.   Physical Exam   Vitals:  06/11/20 0734 06/11/20 1110 06/11/20 1112 06/11/20 1114  BP:      Pulse: 81  87   Resp: 18  19   Temp: (!) 96.8 F (36 C)  (!) 97.3 F (36.3 C)   TempSrc:      SpO2: 100% 100% 100% 100%  Weight:      Height:         Intake/Output Summary (Last 24 hours) at 06/11/2020 1216 Last data filed at 06/11/2020 1000 Gross per 24 hour  Intake 3401.7 ml  Output 8342 ml  Net -4940.3 ml    Last 3 Weights 06/11/2020 06/10/2020 06/08/2020  Weight (lbs) 144 lb 6.4 oz 168 lb 6.9 oz 189 lb 6 oz  Weight (kg) 65.5 kg 76.4 kg 85.9 kg    Body mass index is 23.31 kg/m.  General: Intubated and sedated on vent, ill-appearing Head: Atraumatic, normal size  Eyes: No scleral icterus Neck: Supple, no JVD Endocrine: No thryomegaly Cardiac: Normal S1, S2; RRR; no murmurs, rubs, or gallops Lungs: Diminished breath sounds bilaterally Abd:  Soft, nontender, no hepatomegaly  Ext: 2+ pitting edema Musculoskeletal: No deformities Skin: Warm and dry, no rashes   Neuro: Intubated and sedated on vent  Labs  High Sensitivity Troponin:   Recent Labs  Lab 06/07/2020 2016 06/05/20 0234 06/05/20 1300  TROPONINIHS 12 818* 5,229*     Cardiac EnzymesNo results for input(s): TROPONINI in the last 168 hours. No results for input(s): TROPIPOC in the last 168 hours.  Chemistry Recent Labs  Lab 06/09/20 0447 06/09/20 1136 06/10/20 0430 06/10/20 1535 06/11/20 0416  NA 134*   < > 133* 135 137  K 4.2   < > 4.3 4.3 4.0  CL 102   < > 102 102 104  CO2 22   < > 20* 21* 22  GLUCOSE 181*   < > 185* 173* 217*  BUN 19   < > 25* 28* 32*  CREATININE 1.91*   < > 1.67* 1.63* 1.55*  CALCIUM 7.2*   < > 7.6* 8.3* 8.1*  PROT 4.7*  --  5.3*  --  5.6*  ALBUMIN 1.8*  1.8*   < > 2.0*  2.0* 2.2* 2.8*  2.8*  AST 4,444*  --  1,491*  --  351*  ALT 1,487*  --  1,225*  --  708*  ALKPHOS 270*  --  467*  --  494*  BILITOT 1.3*  --  1.2  --  1.3*  GFRNONAA 35*   < > 41* 43* 45*  ANIONGAP 10   < > _0 < > = values in this interval not displayed.    Hematology Recent Labs  Lab 06/09/20 0447 06/09/20 0447 06/09/20 1136 06/10/20 0430 06/11/20 0416  WBC 13.7*  --   --  20.5* 29.6*  RBC 3.28*  --   --  3.76* 3.61*  HGB 10.2*   < > 10.9* 11.6* 11.2*  HCT 30.5*   < > 32.0* 35.6* 34.2*  MCV 93.0  --   --  94.7 94.7  MCH 31.1  --   --  30.9 31.0  MCHC 33.4  --   --  32.6 32.7  RDW 14.8  --   --  15.1 15.5  PLT 179  --   --  245 264   < > = values in this interval not displayed.   BNPNo results for input(s): BNP, PROBNP in the last 168 hours.  DDimer No results for input(s): DDIMER in  the last 168 hours.   Radiology  DG Chest Port 1 View  Result Date: 06/10/2020 CLINICAL DATA:  Respiratory failure. EXAM: PORTABLE CHEST 1 VIEW COMPARISON:  June 06, 2020. FINDINGS: Stable cardiomediastinal silhouette. Endotracheal and nasogastric tubes  are unchanged in position. No pneumothorax is noted. Right basilar subsegmental atelectasis is noted. Left perihilar and basilar opacity is again noted concerning for pneumonia or atelectasis. No significant pleural effusion is noted. Bony thorax is unremarkable. IMPRESSION: Stable support apparatus. Stable left perihilar and basilar opacity is noted concerning for pneumonia or atelectasis. Right basilar subsegmental atelectasis is noted. Electronically Signed   By: Marijo Conception M.D.   On: 06/10/2020 08:07    Cardiac Studies  TTE 06/05/2020 1. Left ventricular ejection fraction, by estimation, is 40%. The left  ventricle has normal function. The left ventricle demonstrates global  hypokinesis. Left ventricular diastolic parameters are indeterminate.  2. Right ventricular systolic function was not well visualized. The right  ventricular size is not well visualized.  3. The mitral valve is normal in structure. No evidence of mitral valve  regurgitation. No evidence of mitral stenosis.  4. The aortic valve is tricuspid. Aortic valve regurgitation is not  visualized. No aortic stenosis is present.  5. The inferior vena cava is normal in size with greater than 50%  respiratory variability, suggesting right atrial pressure of 3 mmHg.   Patient Profile  SERAFINO BURCIAGA a 79 y.o.malewith CAD status post CABG,paroxysmal atrial flutter,metastatic prostate cancer,hypertension who was admitted to [facility] on 06/13/2020. He was found to have pancolitis and C. difficile. His course was complicated on 05/02/7252 with VF arrest.  Assessment & Plan   1.  VF arrest secondary to septic shock from severe C. difficile pancolitis/AKI on CRRT/acute liver injury/acute systolic heart failure -Transfer from [facility] with VF arrest.  Found to have septic shock from severe C. difficile pancolitis.  Suspect this is just secondary in the setting of acidosis and known CAD status post CABG. -EF 40%  with global hypokinesis. -In multiorgan system failure in ICU.  Prognosis is poor. -Continue supportive care for now.  Has had atrial fibrillation and atrial tach episodes.  Would continue amiodarone drip for now.  Can transition to p.o. when able. -Left heart catheterization could be considered once he is out of the ICU and if he does recover from this critical illness.  2.  Paroxysmal atrial fibrillation/atrial tachycardia -He has had A. fib and A. tach episodes.  He is on IV amiodarone and this controls his rhythms.  We will continue IV amiodarone for now.  He is on heparin IV.  We will transition to p.o. amiodarone when able.  200 mg daily would be fine.  The cardiology consult service will sign off at this time.  Please call us with questions.  For now we will continue with supportive care.  If he does recover and is able to survive this critical illness, we would possibly consider a left heart catheterization.  I did discuss this with the daughter at the bedside.  For questions or updates, please contact Gordon Please consult [email or web address] for contact info under   Time Spent with Patient: I have spent a total of 25 minutes with patient reviewing hospital notes, telemetry, EKGs, labs and examining the patient as well as establishing an assessment and plan that was discussed with the patient.  > 50% of time was spent in direct patient care.    Signed, Addison Naegeli. Audie Box, MD [facility]  06/11/2020 12:16 PM

## 2020-06-12 ENCOUNTER — Inpatient Hospital Stay (HOSPITAL_COMMUNITY): Payer: Medicare Other

## 2020-06-12 ENCOUNTER — Other Ambulatory Visit (HOSPITAL_COMMUNITY): Payer: Medicare Other

## 2020-06-12 ENCOUNTER — Encounter (HOSPITAL_COMMUNITY): Payer: Self-pay | Admitting: Pulmonary Disease

## 2020-06-12 DIAGNOSIS — R778 Other specified abnormalities of plasma proteins: Secondary | ICD-10-CM | POA: Diagnosis not present

## 2020-06-12 DIAGNOSIS — Z452 Encounter for adjustment and management of vascular access device: Secondary | ICD-10-CM

## 2020-06-12 DIAGNOSIS — I469 Cardiac arrest, cause unspecified: Secondary | ICD-10-CM | POA: Diagnosis not present

## 2020-06-12 DIAGNOSIS — J9601 Acute respiratory failure with hypoxia: Secondary | ICD-10-CM | POA: Diagnosis not present

## 2020-06-12 DIAGNOSIS — I10 Essential (primary) hypertension: Secondary | ICD-10-CM

## 2020-06-12 DIAGNOSIS — R569 Unspecified convulsions: Secondary | ICD-10-CM

## 2020-06-12 DIAGNOSIS — K529 Noninfective gastroenteritis and colitis, unspecified: Secondary | ICD-10-CM | POA: Diagnosis not present

## 2020-06-12 LAB — HEPARIN LEVEL (UNFRACTIONATED)
Heparin Unfractionated: 0.1 IU/mL — ABNORMAL LOW (ref 0.30–0.70)
Heparin Unfractionated: 0.25 IU/mL — ABNORMAL LOW (ref 0.30–0.70)

## 2020-06-12 LAB — CBC
HCT: 34.3 % — ABNORMAL LOW (ref 39.0–52.0)
Hemoglobin: 11.3 g/dL — ABNORMAL LOW (ref 13.0–17.0)
MCH: 31.3 pg (ref 26.0–34.0)
MCHC: 32.9 g/dL (ref 30.0–36.0)
MCV: 95 fL (ref 80.0–100.0)
Platelets: 280 10*3/uL (ref 150–400)
RBC: 3.61 MIL/uL — ABNORMAL LOW (ref 4.22–5.81)
RDW: 16 % — ABNORMAL HIGH (ref 11.5–15.5)
WBC: 29.3 10*3/uL — ABNORMAL HIGH (ref 4.0–10.5)
nRBC: 34.9 % — ABNORMAL HIGH (ref 0.0–0.2)

## 2020-06-12 LAB — RENAL FUNCTION PANEL
Albumin: 2.7 g/dL — ABNORMAL LOW (ref 3.5–5.0)
Albumin: 2.7 g/dL — ABNORMAL LOW (ref 3.5–5.0)
Anion gap: 15 (ref 5–15)
Anion gap: 13 (ref 5–15)
BUN: 42 mg/dL — ABNORMAL HIGH (ref 8–23)
BUN: 45 mg/dL — ABNORMAL HIGH (ref 8–23)
CO2: 18 mmol/L — ABNORMAL LOW (ref 22–32)
CO2: 21 mmol/L — ABNORMAL LOW (ref 22–32)
Calcium: 8 mg/dL — ABNORMAL LOW (ref 8.9–10.3)
Calcium: 8 mg/dL — ABNORMAL LOW (ref 8.9–10.3)
Chloride: 100 mmol/L (ref 98–111)
Chloride: 101 mmol/L (ref 98–111)
Creatinine, Ser: 1.66 mg/dL — ABNORMAL HIGH (ref 0.61–1.24)
Creatinine, Ser: 1.74 mg/dL — ABNORMAL HIGH (ref 0.61–1.24)
GFR, Estimated: 42 mL/min — ABNORMAL LOW (ref >60)
GFR, Estimated: 39 mL/min — ABNORMAL LOW (ref >60)
Glucose, Bld: 206 mg/dL — ABNORMAL HIGH (ref 70–99)
Glucose, Bld: 173 mg/dL — ABNORMAL HIGH (ref 70–99)
Phosphorus: 3 mg/dL (ref 2.5–4.6)
Phosphorus: 3.6 mg/dL (ref 2.5–4.6)
Potassium: 4.5 mmol/L (ref 3.5–5.1)
Potassium: 4.5 mmol/L (ref 3.5–5.1)
Sodium: 133 mmol/L — ABNORMAL LOW (ref 135–145)
Sodium: 135 mmol/L (ref 135–145)

## 2020-06-12 LAB — GLUCOSE, CAPILLARY
Glucose-Capillary: 197 mg/dL — ABNORMAL HIGH (ref 70–99)
Glucose-Capillary: 174 mg/dL — ABNORMAL HIGH (ref 70–99)
Glucose-Capillary: 188 mg/dL — ABNORMAL HIGH (ref 70–99)
Glucose-Capillary: 161 mg/dL — ABNORMAL HIGH (ref 70–99)
Glucose-Capillary: 149 mg/dL — ABNORMAL HIGH (ref 70–99)
Glucose-Capillary: 177 mg/dL — ABNORMAL HIGH (ref 70–99)

## 2020-06-12 MED ORDER — NYSTATIN 100000 UNIT/ML MT SUSP
5.0000 mL | Freq: Four times a day (QID) | OROMUCOSAL | Status: DC
  Administered 2020-06-12 – 2020-06-14 (×9): 500000 [IU] via ORAL
  Filled 2020-06-12 (×9): qty 5

## 2020-06-12 MED ORDER — BACID PO TABS
2.0000 | ORAL_TABLET | Freq: Three times a day (TID) | ORAL | Status: DC
  Administered 2020-06-12 – 2020-06-14 (×7): 2
  Filled 2020-06-12 (×9): qty 2

## 2020-06-12 MED ORDER — FLORANEX PO PACK: 1.0000 g | PACK | Freq: Three times a day (TID) | ORAL | Status: DC

## 2020-06-12 MED ORDER — IOHEXOL 350 MG/ML SOLN
75.0000 mL | Freq: Once | INTRAVENOUS | Status: AC | PRN
  Administered 2020-06-12: 75 mL via INTRAVENOUS

## 2020-06-12 MED ORDER — LORAZEPAM 2 MG/ML IJ SOLN
INTRAMUSCULAR | Status: AC
  Administered 2020-06-12: 4 mg
  Filled 2020-06-12: qty 2

## 2020-06-12 MED ORDER — FENTANYL CITRATE (PF) 100 MCG/2ML IJ SOLN
25.0000 ug | INTRAMUSCULAR | Status: DC | PRN
  Administered 2020-06-12: 50 ug via INTRAVENOUS

## 2020-06-12 MED ORDER — SODIUM CHLORIDE 0.9 % IV SOLN
4000.0000 mg | INTRAVENOUS | Status: AC
  Administered 2020-06-12: 4000 mg via INTRAVENOUS
  Filled 2020-06-12: qty 40

## 2020-06-12 MED ORDER — HYPROMELLOSE (GONIOSCOPIC) 2.5 % OP SOLN
1.0000 [drp] | Freq: Three times a day (TID) | OPHTHALMIC | Status: DC
  Administered 2020-06-12 – 2020-06-14 (×6): 1 [drp] via OPHTHALMIC
  Filled 2020-06-12: qty 15

## 2020-06-12 MED ORDER — VITAL AF 1.2 CAL PO LIQD
1000.0000 mL | ORAL | Status: DC
  Administered 2020-06-12 – 2020-06-13 (×2): 1000 mL
  Filled 2020-06-12: qty 1000

## 2020-06-12 MED ORDER — FENTANYL CITRATE (PF) 100 MCG/2ML IJ SOLN: 25.0000 ug | INTRAMUSCULAR | Status: DC | PRN

## 2020-06-12 MED ORDER — LEVETIRACETAM IN NACL 1500 MG/100ML IV SOLN
1500.0000 mg | Freq: Two times a day (BID) | INTRAVENOUS | Status: DC
  Administered 2020-06-13 – 2020-06-14 (×3): 1500 mg via INTRAVENOUS
  Filled 2020-06-12 (×4): qty 100

## 2020-06-12 NOTE — Progress Notes (Signed)
[facility] NEPHROLOGY PROGRESS NOTE  Assessment/ Plan: Pt is a 79 y.o. yo male with CAD, atrial flutter, CKD, HTN, HLD, prostate cancer who present w/ C. difficile diarrhea, shock, cardiac arrest.  #Oliguric AKI on CKD stage III due to ATN after cardiac arrest/shock: Did not respond with IV diuretics and hemodynamic support.  Started CRRT on 11/7.  Tolerating UF well with the help of pressors to maintain BP.  He still looks volume up therefore continue ultrafiltration. Continue current CRRT prescription, all 4K.  On IV heparin. CT scan without hydronephrosis. Monitor urine output and labs.  #Acute hypoxic, hypercapnic respiratory failure requiring mechanical ventilation: PCCM is following.  #VF arrest/acute systolic heart failure: Cardiology is following.  Considering left heart cath when he is a stable.  #Septic shock due to C. difficile colitis: Antibiotics per primary team.  On pressors including Levophed and vasopressin.  Maintaining MAP more than 65.  #History of metastatic prostate cancer with bone metastasis and stable pancreatic mass.  # Non-anion gap metabolic acidosis associated with diarrhea.  Bicarb was as low as 12.    Improved now.  #Hyponatremia/hypochloremia: Likely related to free water retention in setting of AKI as well as isotonic fluid loss from diarrhea.  Now managed with CRRT.  # Hypocalcemia:   low albumin level.  Replete calcium.  #Hypophosphatemia: Repleted phosphorus yesterday.  Monitor lab.  #Shock liver.  Trend liver enzymes.  Subjective: Seen and examined ICU.  No new event.  Tolerating UF, CRRT well.  Remains intubated and sedated.  On 2 pressors. Objective Vital signs in last 24 hours: Vitals:   06/12/20 0630 06/12/20 0645 06/12/20 0700 06/12/20 0741  BP:      Pulse: 93 93 93 96  Resp: 15 18 17  (!) 23  Temp: 99.5 F (37.5 C) 99.5 F (37.5 C) 99.5 F (37.5 C) 99.7 F (37.6 C)  TempSrc:      SpO2: 99% 99% 99% 100%  Weight:       Height:       Weight change: 1.5 kg  Intake/Output Summary (Last 24 hours) at 06/12/2020 0835 Last data filed at 06/12/2020 0700 Gross per 24 hour  Intake 3238.42 ml  Output 5760 ml  Net -2521.58 ml       Labs: Basic Metabolic Panel: Recent Labs  Lab 06/11/20 0416 06/11/20 1600 06/12/20 0423  NA 137 134* 133*  K 4.0 4.4 4.5  CL 104 102 100  CO2 22 19* 18*  GLUCOSE 217* 197* 206*  BUN 32* 37* 42*  CREATININE 1.55* 1.53* 1.66*  CALCIUM 8.1* 8.1* 8.0*  PHOS 2.5 3.3 3.0   Liver Function Tests: Recent Labs  Lab 06/09/20 0447 06/09/20 1511 06/10/20 0430 06/10/20 1535 06/11/20 0416 06/11/20 1600 06/12/20 0423  AST 4,444*  --  1,491*  --  351*  --   --   ALT 1,487*  --  1,225*  --  708*  --   --   ALKPHOS 270*  --  467*  --  494*  --   --   BILITOT 1.3*  --  1.2  --  1.3*  --   --   PROT 4.7*  --  5.3*  --  5.6*  --   --   ALBUMIN 1.8*  1.8*   < > 2.0*  2.0*   < > 2.8*  2.8* 3.0* 2.7*   < > = values in this interval not displayed.   No results for input(s): LIPASE, AMYLASE in the last 168  hours. No results for input(s): AMMONIA in the last 168 hours. CBC: Recent Labs  Lab 06/08/20 0230 06/08/20 0230 06/09/20 0447 06/09/20 1136 06/10/20 0430 06/11/20 0416 06/12/20 0423  WBC 15.6*   < > 13.7*  --  20.5* 29.6* 29.3*  HGB 9.3*   < > 10.2*   < > 11.6* 11.2* 11.3*  HCT 28.5*   < > 30.5*   < > 35.6* 34.2* 34.3*  MCV 95.3  --  93.0  --  94.7 94.7 95.0  PLT 205   < > 179  --  245 264 280   < > = values in this interval not displayed.   Cardiac Enzymes: No results for input(s): CKTOTAL, CKMB, CKMBINDEX, TROPONINI in the last 168 hours. CBG: Recent Labs  Lab 06/11/20 1123 06/11/20 1540 06/11/20 2020 06/11/20 2337 06/12/20 0427  GLUCAP 240* 228* 192* 192* 197*    Iron Studies: No results for input(s): IRON, TIBC, TRANSFERRIN, FERRITIN in the last 72 hours. Studies/Results: No results found.  Medications: Infusions: .  prismasol BGK 4/2.5 400  mL/hr at 06/12/20 0109  .  prismasol BGK 4/2.5 200 mL/hr at 06/12/20 0150  . sodium chloride 5 mL/hr at 06/11/20 1800  . amiodarone 30 mg/hr (06/12/20 0700)  . dexmedetomidine (PRECEDEX) IV infusion 0.3 mcg/kg/hr (06/12/20 0700)  . dextrose Stopped (06/07/20 2017)  . famotidine (PEPCID) IV    . feeding supplement (VITAL AF 1.2 CAL) 55 mL/hr at 06/11/20 1800  . fentaNYL infusion INTRAVENOUS 40 mcg/hr (06/12/20 0700)  . heparin 500 Units/hr (06/12/20 0700)  . norepinephrine (LEVOPHED) Adult infusion 8 mcg/min (06/12/20 0700)  . prismasol BGK 4/2.5 1,600 mL/hr at 06/12/20 0142  . vasopressin 0.03 Units/min (06/12/20 0700)    Scheduled Medications: . artificial tears   Both Eyes BID  . aspirin  162 mg Per Tube Daily  . B-complex with vitamin C  1 tablet Per Tube Daily  . chlorhexidine gluconate (MEDLINE KIT)  15 mL Mouth Rinse BID  . Chlorhexidine Gluconate Cloth  6 each Topical Daily  . feeding supplement (PROSource TF)  45 mL Per Tube TID  . hydrocortisone sod succinate (SOLU-CORTEF) inj  50 mg Intravenous Q6H  . insulin aspart  1-3 Units Subcutaneous Q4H  . insulin aspart  3 Units Subcutaneous Q4H  . mouth rinse  15 mL Mouth Rinse 10 times per day  . metroNIDAZOLE  500 mg Per Tube Q8H  . sodium chloride flush  10-40 mL Intracatheter Q12H  . vancomycin  125 mg Per Tube QID   Followed by  . [START ON 06/25/2020] vancomycin  125 mg Per Tube BID   Followed by  . [START ON 07/03/2020] vancomycin  125 mg Per Tube Daily   Followed by  . [START ON 07/10/2020] vancomycin  125 mg Per Tube QODAY   Followed by  . [START ON 07/18/2020] vancomycin  125 mg Per Tube Q3 days    have reviewed scheduled and prn medications.  Physical Exam: General: Intubated, sedated, unresponsive Heart:RRR, s1s2 nl Lungs: Coarse breath sound bilateral, Abdomen: Soft, nondistended Extremities: Has anasarca with pitting edema, not much improvement Dialysis Access: Groin catheter.  Avy Barlett Prasad  Terresa Marlett 06/12/2020,8:35 AM  LOS: 8 days  Pager: [phone]

## 2020-06-12 NOTE — Consult Note (Signed)
[place] for Heparin Indication: atrial fibrillation  Allergies  Allergen Reactions  . Latex Rash  . Sulfa Antibiotics Rash    Patient Measurements: Height: 5\' 6"  (167.6 cm) Weight: 67 kg (147 lb 11.3 oz) IBW/kg (Calculated) : 63.8 Heparin Dosing Weight: 82.7 kg  Vital Signs: Temp: 98.8 F (37.1 C) (11/12 0400) Temp Source: Esophageal (11/12 0400) Pulse Rate: 86 (11/12 0400)  Labs: Recent Labs    06/10/20 0255 06/10/20 0430 06/10/20 0430 06/10/20 1535 06/11/20 0416 06/11/20 1600 06/12/20 0423  HGB  --  11.6*   < >  --  11.2*  --  11.3*  HCT  --  35.6*  --   --  34.2*  --  34.3*  PLT  --  245  --   --  264  --  280  HEPARINUNFRC   < >  --   --  0.58 0.38  --  0.10*  CREATININE   < > 1.67*  --  1.63* 1.55* 1.53*  --    < > = values in this interval not displayed.    Estimated Creatinine Clearance: 35.3 mL/min (A) (by C-G formula based on SCr of 1.53 mg/dL (H)).  Assessment: 79 y.o. male with h/o Afib on apixaban prior to admit. Patient transitioned over to heparin.   Heparin level down to subtherapeutic (0.1) on gtt at 300 units/hr. No issues with line or bleeding reported per RN. Pt with improving SCr so likely clearing heparin better.  Goal of Therapy:  Heparin level 0.3-0.7 units/mL Monitor platelets by anticoagulation protocol: Yes   Plan:  Increase Heparin to 500 units/hr F/u 8 hr heparin level  Sherlon Handing, PharmD, BCPS Please see amion for complete clinical pharmacist phone list 06/12/2020 5:17 AM

## 2020-06-12 NOTE — Progress Notes (Signed)
Nutrition Follow-up  DOCUMENTATION CODES:   Not applicable  INTERVENTION:   Tube Feeding Increase Vital AF 1.2 at 60 ml/hr Pro-Source TF 45 mL to TID Provides 1848 kcals, 141 g of protein and 1166 mL of free water  Continue B-complex with C   Recommend addition of probiotic; discussed with CCM  NUTRITION DIAGNOSIS:   Inadequate oral intake related to acute illness as evidenced by NPO status.  Being addressed via TF   GOAL:   Patient will meet greater than or equal to 90% of their needs  Met  MONITOR:   Labs, Weight trends, TF tolerance, Skin, Vent status  REASON FOR ASSESSMENT:   Consult, Ventilator Enteral/tube feeding initiation and management  ASSESSMENT:   79 yo male admitted with 10 day hx of diarrhea with CT abdomen indicating pancolitis, went into cardiac arrest in ED and required intubation, shock requiring pressors, AKI on CKD requiring RRT. PMH includes prostate cancer with bone mets and pancreatic mass, CAD/CABG, HTN, HLD, GERD  11/04 Admitted, Vfib arrest, CT abdomen marked pancolitis 11/05 Shock due to pancolitis 11/06 Adult TF protocol initiated 11/07 CRRT initiated  Pt remains on vent support, on CRRT-UF around 238m/hr  remains on levophed and vasopressin. Pt sedated with fentanyl and precedex gtt  Tolerating Vital AF 1.2 at 55 ml.hr, Pro-Source TF 45 mL TID  Weight down to 67 kg; weight yesterday 65.5 kg. Admit weight 89.5 kg. Edema improving but still present; 2+ UE, 3+ LE. Still unsure of actual dry weight Attempted a repeat nutrition focused physical exam but pt still edematous and all extremities wrapped in gauze Estimated 34 L removed via CRRT since start on 11/7 ;net +3 L per I/O flow sheet  Significant stool output via rectal tube: 1.4 L in 24 hours, C.diff positive with pancolitis. Research mixed with regards to use of probiotics and treatment of C.diff diarrhea but recommend considering probiotic therapy in this case  Labs: CBGs  141-240 (ICU goal 140-180), sodium 133 (L), phosphorus 4.5 (wdl), phosphorus 3.0 (wdl); LFTs improving Meds: B-complex with C, ss novolog, novolog q 4 hours, potassium phosphate,    Diet Order:   Diet Order            Diet NPO time specified  Diet effective now                 EDUCATION NEEDS:   Not appropriate for education at this time  Skin:  Skin Assessment: Skin Integrity Issues: Skin Integrity Issues:: DTI DTI: bilateral heels Other: skin tears, shearing; weeping all over from signifcant edema  Last BM:  11/12 rectal tube (C.diff)  Height:   Ht Readings from Last 1 Encounters:  06/07/20 5' 6"  (1.676 m)    Weight:   Wt Readings from Last 1 Encounters:  06/12/20 67 kg    BMI:  Body mass index is 23.84 kg/m.  Estimated Nutritional Needs:   Kcal:  1710 kcals  Protein:  120-145 g  Fluid:  >/= 1.5 (minimize fluid administration given sig edema on exam)    CKerman PasseyMS, RDN, LDN, CNSC Registered Dietitian III Clinical Nutrition RD Pager and On-Call Pager Number Located in [place] [place]

## 2020-06-12 NOTE — Progress Notes (Signed)
PCCM progress note  On evening round I was notified by bedside RN that patient had developed hypotension and tachycardia both improved with pause of CRRT. Decision was made to run patient positive until he can be reassessed by nephrology tomorrow morning. He is likely becoming intravascularly dry as he has been diuresed ~ 30L in the last 5 days. If hypotension and tachycardia persist can consider stopping CRRT.   Johnsie Cancel, NP-C [facility] Contact / Pager information can be found on Amion  06/12/2020, 5:17 PM

## 2020-06-12 NOTE — Progress Notes (Signed)
Upon entering patients room at 1715 I noticed he had an upward gaze and was rigid.  Last known normal was 1655. He was no longer following commands or moving his arms.  He had no response to painful stimuli to hands and feet.  When I did a deep sternal rub his eyes slowly moved right then left.  I notified Merlene Laughter NP for CCM as well as calling a Code Stroke.  Blood returned on CRRT and patient was transported to CT1 with this RN, RT and stroke nurse without incident. I called patients Wife and daughter and updated them on what happened and our plan per neurology.

## 2020-06-12 NOTE — Progress Notes (Signed)
Code stroke activated  CT head negative  Seizures  Received Ativan 4 mg  Keppra will be ordered EEG

## 2020-06-12 NOTE — Consult Note (Addendum)
Neurology Consult H&P  CC: acute unresponsiveness  History is obtained from: chart and staff  HPI: Samuel Gregory is a 79 y.o. male previously able to follow commands and ~1730 noted to minimally responsive with intermittent upward gaze pupils 8mm bilaterally and code stroke activated.   LKW: ~1710 tpa given?: No, metastatic disease, heparin increased to 500 this morning, NIHSS 29 IR Thrombectomy? No lvo Modified Rankin Scale: 0-Completely asymptomatic and back to baseline post- stroke NIHSS: 29  ROS: Unable to assess due to intubation and altered mental status.   Past Medical History:  Diagnosis Date  . Arthritis   . Atrial flutter ([place])    a. diagnosed in 12/2019 while admitted for sepsis  . Collagen vascular disease ([place])   . Coronary atherosclerosis of native coronary artery    a. s/p CABG in 2000 b. NST in 10/2017 showing mild to moderate per-infarct ischemia with medical management recommended  . Essential hypertension   . Hyperlipidemia   . Prostate cancer [facility])      Family History  Problem Relation Age of Onset  . Heart attack Mother   . Cancer Mother   . Aneurysm Father     Social History:  reports that he has never smoked. He has never used smokeless tobacco. He reports current alcohol use. He reports that he does not use drugs.  Prior to Admission medications   Medication Sig Start Date End Date Taking? Authorizing Provider  acetaminophen (TYLENOL) 325 MG tablet Take 2 tablets (650 mg total) by mouth every 6 (six) hours as needed for mild pain, fever or headache (or Fever >/= 101). 02/06/20  Yes Emokpae, Courage, MD  apixaban (ELIQUIS) 5 MG TABS tablet Take 1 tablet (5 mg total) by mouth 2 (two) times daily. 03/31/20 03/26/21 Yes Strader, Fransisco Hertz, PA-C  b complex vitamins capsule Take 1 capsule by mouth daily.   Yes [provider]  Calcium-Magnesium-Vitamin D (CALCIUM 1200+D3 PO) Take 1 tablet by mouth daily.    Yes [provider]   cholecalciferol (VITAMIN D3) 25 MCG (1000 UNIT) tablet Take 10,000 Units by mouth daily.    Yes [provider]  fluticasone (FLONASE) 50 MCG/ACT nasal spray Place 1 spray into both nostrils daily as needed for allergies or rhinitis.   Yes [provider]  metoprolol tartrate (LOPRESSOR) 25 MG tablet Take 75 mg by mouth 2 (two) times daily.  04/27/20  Yes [provider]  Multiple Vitamin (MULTIVITAMIN WITH MINERALS) TABS Take 1 tablet by mouth daily.   Yes [provider]  predniSONE (DELTASONE) 5 MG tablet Take 5 mg by mouth 2 (two) times daily with a meal. Take with cancer medication.   Yes [provider]  rosuvastatin (CRESTOR) 40 MG tablet Take 1 tablet (40 mg total) by mouth daily after supper. 11/29/19 06/05/20 Yes Satira Sark, MD  triamcinolone cream (KENALOG) 0.1 % Apply topically. 05/01/20  Yes [provider]  ZYTIGA 250 MG tablet Take 4 tablets by mouth every morning.  09/20/16  Yes [provider]  nitroGLYCERIN (NITROSTAT) 0.4 MG SL tablet Place 0.4 mg under the tongue every 5 (five) minutes as needed for chest pain.    [provider]    Exam: Current vital signs: BP (!) 147/71   Pulse 83   Temp (!) 96.6 F (35.9 C)   Resp (!) 27   Ht 5\' 6"  (1.676 m)   Wt 67 kg   SpO2 100%   BMI 23.84 kg/m  Physical Exam  Constitutional: Appears well-developed and well-nourished.  Psych:Unable to assess due to intubation and altered mental status.  Eyes: subconjunctival hemorrhage left eye, both eyes midline with up-gaze not overcome by flexion. HENT: No OP obstrucion Head: Normocephalic.  Cardiovascular: Normal rate and regular rhythm.  Respiratory: Effort normal and breath sounds normal to ascultation GI: Soft.  No distension. There is no tenderness.  Skin: WDI  Neuro: Mental Status: Unable to assess due to intubation and altered mental status Cranial Nerves: Pupils ~1.24mm equal, round, and reactive to  light. Motor: Tone is increased and patient is clonic. Bulk is normal. Unable to assess due to intubation and altered mental status Sensory: Unable to assess due to intubation and altered mental status Deep Tendon Reflexes: mute Plantars: Toes are downgoing bilaterally. Cerebellar: Unable to assess due to intubation and altered mental status   I have reviewed the images obtained: NCT head showed no acute ischemic changes. CTA head and neck showed no LVO  Assessment: Samuel Gregory is a 79 y.o. male complicated medical condition CRRT with exam showing both eyes midline with up-gaze not overcome by neck flexion (limted) and diffusely clonic. Imaging negative, no neuroleptics. Lorazepam 4mg  IV given stat with improvement of clonic extension.   Impression:  Acute encephalopathy Generalized clonic extension improved with lorazepam will need to rule out seizure. Suspicion of stroke not candidate for intervention. HTN. HLD Hypoxic respiratory failure on ventilator.  Plan: - Levetiracetam 4,000mg  STAT followed by 1,500mg  two times daily. - rEEG to eval for any epileptogenic discharges if seizure activity will need long term EEG monitoring.. - Recommend metabolic/infectious workup with CBC, CMP, UA with UCx, CXR, CK, Mg, Phos, serum lactate, coags. - MRI brain without contrast. - Recommend vascular imaging with MRA head and neck. - Continue heparin. - SBP<180. - Telemetry monitoring for arrhythmia. - Recommend PT/OT/SLP consult.  This patient is critically ill and at significant risk of neurological worsening, death and care requires constant monitoring of vital signs, hemodynamics,respiratory and cardiac monitoring, neurological assessment, discussion with family, other specialists and medical decision making of high complexity. I spent 75 minutes of neurocritical care time  in the care of  this patient. This was time spent independent of any time provided by nurse practitioner or  PA.  Electronically signed by: Dr. Lynnae Sandhoff Pager: [phone] 06/12/2020, 6:21 PM

## 2020-06-12 NOTE — Progress Notes (Signed)
2 H RN called code stroke when pt noted to be unresponsive with eyes rolled up in back of head at 1750. He was previously last known "well" at 1650, at which time he was alert, could track and move all extremities. Stroke team arrived to 2H at 1800. CBG 154. Pt taken off of CRRT. Pt taken to CT, at 1810, where he was met by neurologist. NCCT obtained. 4 mg Ativan given as high suspiscion for seizure. Additional IV access obtained by 2H RN, and CTA preformed. CTA negative for LVO per Dr Collins. Pt to return to 2H where he will be loaded with Keppra and an EEG will be obtained. Pt not candidate for TPA as heparin level high, history metastatic disease and low suspiscion for stroke. Pt not IR candidate as CTA negative for LVO.  

## 2020-06-12 NOTE — Consult Note (Signed)
[place] for Heparin Indication: atrial fibrillation  Allergies  Allergen Reactions  . Latex Rash  . Sulfa Antibiotics Rash    Patient Measurements: Height: 5\' 6"  (167.6 cm) Weight: 67 kg (147 lb 11.3 oz) IBW/kg (Calculated) : 63.8 Heparin Dosing Weight: 82.7 kg  Vital Signs: Temp: 97.7 F (36.5 C) (11/12 2042) Temp Source: Oral (11/12 2042) BP: 119/57 (11/12 1954) Pulse Rate: 75 (11/12 2315)  Labs: Recent Labs    06/10/20 0430 06/10/20 1535 06/11/20 0416 06/11/20 0416 06/11/20 1600 06/12/20 0423 06/12/20 1557 06/12/20 2304  HGB 11.6*  --  11.2*  --   --  11.3*  --   --   HCT 35.6*  --  34.2*  --   --  34.3*  --   --   PLT 245  --  264  --   --  280  --   --   HEPARINUNFRC  --    < > 0.38  --   --  0.10*  --  0.25*  CREATININE 1.67*   < > 1.55*   < > 1.53* 1.66* 1.74*  --    < > = values in this interval not displayed.    Estimated Creatinine Clearance: 31.1 mL/min (A) (by C-G formula based on SCr of 1.74 mg/dL (H)).  Assessment: 79 y.o. male with h/o Afib on apixaban prior to admit. Patient transitioned over to heparin.   Heparin level remains subtherapeutic (0.25) on gtt at 500 units/hr. No issues with line or bleeding reported per RN.   Goal of Therapy:  Heparin level 0.3-0.7 units/mL Monitor platelets by anticoagulation protocol: Yes   Plan:  Increase Heparin to 650 units/hr F/u 8 hr heparin level  Sherlon Handing, PharmD, BCPS Please see amion for complete clinical pharmacist phone list 06/12/2020 11:40 PM

## 2020-06-12 NOTE — Procedures (Signed)
Patient Name: Samuel Gregory  MRN: [id number]  Epilepsy Attending: Lora Havens  Referring Physician/Provider: Dr Lynnae Sandhoff Date: 06/12/2020 Duration: 34.50 mins  Patient history: 79yo M with sudden onset of unresponsiveness and seizure like activity. EEG to evaluate for seizure  Level of alertness:  comatose  AEDs during EEG study: Ativan, LEV  Technical aspects: This EEG study was done with scalp electrodes positioned according to the 10-20 International system of electrode placement. Electrical activity was acquired at a sampling rate of 500Hz  and reviewed with a high frequency filter of 70Hz  and a low frequency filter of 1Hz . EEG data were recorded continuously and digitally stored.   Description: EEG showed continuous generalized low amplitude rhythmic 2-3Hz  delta slowing with intermittent 9-10 hz generalized alpha activity.  Hyperventilation and photic stimulation were not performed.     ABNORMALITY -Continuous delta slow, generalized  IMPRESSION: This study is suggestive of severe diffuse encephalopathy, nonspecific etiology but likely related to sedation, post-ictal state. No seizures or epileptiform discharges were seen throughout the recording.  [name] Barbra Sarks

## 2020-06-12 NOTE — Procedures (Signed)
Cortrak  Person Inserting Tube:  Javonn Gauger C, RD Tube Type:  Cortrak - 43 inches Tube Location:  Left nare Initial Placement:  Stomach Secured by: Bridle Technique Used to Measure Tube Placement:  Documented cm marking at nare/ corner of mouth Cortrak Secured At:  71 cm    Cortrak Tube Team Note:  Consult received to place a Cortrak feeding tube.   No x-ray is required. RN may begin using tube.   If the tube becomes dislodged please keep the tube and contact the Cortrak team at [email or web address] ([email or web address]) for replacement.  If after hours and replacement cannot be delayed, place a NG tube and confirm placement with an abdominal x-ray.    Brandan Robicheaux P., RD, LDN, CNSC See AMiON for contact information    

## 2020-06-12 NOTE — Consult Note (Signed)
[place] Nurse Consult Note: Patient receiving care in [place] Reason for Consult: DTPI to bilateral heels Wound type: DTPI to bilateral heels. Pressure Injury POA: No Measurement: Bedside RN will document in Flowsheet Wound bed: Purple Drainage (amount, consistency, odor) None Periwound: Inrtact Dressing procedure/placement/frequency: Apply Mepilex heel Kellie Simmering # [phone])  protectors and Prevalon boots([place] # [id number]) to both heels.  May be reversible with pressure relieved to the area.   Monitor the wound area(s) for worsening of condition such as: Signs/symptoms of infection, increase in size, development of or worsening of odor, development of pain, or increased pain at the affected locations.   Notify the medical team if any of these develop.  Thank you for the consult. [place] nurse will follow weekly  Please re-consult the [place] team if needed.  Cathlean Marseilles Tamala Julian, MSN, RN, [place], Lysle Pearl, [facility] Wound Treatment Associate Pager [phone]

## 2020-06-12 NOTE — Procedures (Signed)
Central Venous Catheter Insertion Procedure Note  Samuel Gregory  [id number]  Jul 28, 1941  Date:06/12/20  Time:1:47 PM   Provider Performing:Pete Johnette Abraham Kary Kos   Procedure: Insertion of Non-tunneled Central Venous [phone]) with US guidance ([id number])   Indication(s) Medication administration and Difficult access  Consent Risks of the procedure as well as the alternatives and risks of each were explained to the patient and/or caregiver.  Consent for the procedure was obtained and is signed in the bedside chart  Anesthesia Topical only with 1% lidocaine   Timeout Verified patient identification, verified procedure, site/side was marked, verified correct patient position, special equipment/implants available, medications/allergies/relevant history reviewed, required imaging and test results available.  Sterile Technique Maximal sterile technique including full sterile barrier drape, hand hygiene, sterile gown, sterile gloves, mask, hair covering, sterile ultrasound probe cover (if used).  Procedure Description Area of catheter insertion was cleaned with chlorhexidine and draped in sterile fashion.  With real-time ultrasound guidance a central venous catheter was placed into the left internal jugular vein. Nonpulsatile blood flow and easy flushing noted in all ports.  The catheter was sutured in place and sterile dressing applied.  Complications/Tolerance None; patient tolerated the procedure well. Chest X-ray is ordered to verify placement for internal jugular or subclavian cannulation.   Chest x-ray is not ordered for femoral cannulation.  EBL Minimal  Specimen(s) None Erick Colace ACNP-BC [place] Pager # [phone] OR # [phone] if no answer

## 2020-06-12 NOTE — Progress Notes (Signed)
PCCM progress note  Notified by RN at approximately 1730 that patient has had neurological change.  She reports he is now minimally responsive with intermittent upward gaze.  Last known normal was approximately 1710.  On my assessment patient is unresponsive to peripheral painful stimuli with nailbed squeeze.  He does have a flicker of facial movement including forehead grimace with deep sternal rub.  Appears to have slight upward gaze.  Pupils are 1 bilaterally with sluggish response to light.  Of note neurological exam performed on 0.1 mg of Precedex, this has since been discontinued.  Prior to this neurological change patient was able to follow commands. Given acute neurological change code stroke was called.  CRRT will be paused and patient will be sent for stat head CT.  Johnsie Cancel, NP-C [facility] Contact / Pager information can be found on Amion  06/12/2020, 5:52 PM

## 2020-06-12 NOTE — Progress Notes (Signed)
NAME:  Samuel Gregory, MRN:  [id number], DOB:  11/08/1940, LOS: 8 ADMISSION DATE:  06/26/2020, CONSULTATION DATE:  11/5 REFERRING MD:  Tat/[place], CHIEF COMPLAINT:  Shock/ resp failure from [facility]  Brief History   70 yowm never smoker with h/o prostate ca and prior c diff infection admitted 11/4 with voluminous diarrhea and abd pain and coded in ER   VF arrest > admit to ICU with pancolitis on admit CT / vent dep/ pressor dep and PCCM consulted am 11/5   History of present illness   79 y.o. male with past medical history of atrial fibrillation on Eliquis, coronary artery disease status post CABG in 2000, hypertension, hyperlipidemia, GERD, HLD,metastatic prostate cancer and history of C. difficile infection x1, who presented for evaluation of acute onset of diarrhea and arrested as w/u in progress.   Per his daugher he was ambulatory prior to acute illness   Past Medical History  Arthritis  A-fib CAD S.P CABG 2000 HTN HLD Prostate cancer    Significant Hospital Events   VF arrest pm 11/4 > amiodarone drip 11/5 shock due to pancolitis  Consults:  PCCM 11/5  Urology 11/5 Cards   11/5   Procedures:  ET  11/5 > R IJ  11/5 > 11/6 R Rad Art line 11/5 > 11/7 L fem art line 11/7 >  L fem HD cath 11/7 >   Significant Diagnostic Tests:  CT abd 11/4 1 > Marked pancolitis that is worse of the rectosigmoid colon. Etiology could be infectious, inflammatory, ischemic. No associated bowel obstruction or perforation. Correlate with stool cultures. Other imaging findings of potential clinical significance: Cholelithiasis with no acute cholecystitis. 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 Small right inguinal hernia containing fat and fluid. Aortic Atherosclerosis  Micro Data:  COVID 19  PCR > 11/4 Neg  Flu A/B  PCR  > 11/4  Neg  Stool 11/4 > neg  C diff antigen/ toxix 11/4 > Both POS BC x 2  11/4 > MRSA PCR 11/5 > neg   Antimicrobials:  Rocephin  11/4    Cipro  11/4  Flagyl  11/4  >>> Zosyn  11/4  >>> Vanc per rectalog  tube11/5  >>>  Interim history/subjective:  No acute issues overnight  Continue to tolerate CRRT well -4L off in the last 24hrs   Objective   Blood pressure (!) 147/71, pulse 93, temperature 99.1 F (37.3 C), temperature source Esophageal, resp. rate 20, height 5\' 6"  (1.676 m), weight 67 kg, SpO2 99 %. CVP:  [0 mmHg-13 mmHg] 2 mmHg  Vent Mode: PRVC FiO2 (%):  [40 %] 40 % Set Rate:  [18 bmp] 18 bmp Vt Set:  [510 mL] 510 mL PEEP:  [5 cmH20] 5 cmH20 Plateau Pressure:  [19 cmH20-29 cmH20] 29 cmH20   Intake/Output Summary (Last 24 hours) at 06/12/2020 0705 Last data filed at 06/12/2020 0600 Gross per 24 hour  Intake 3288.26 ml  Output 6039 ml  Net -2750.74 ml   Filed Weights   06/10/20 0500 06/11/20 0600 06/12/20 0453  Weight: 76.4 kg 65.5 kg 67 kg   CVP:  [0 mmHg-13 mmHg] 2 mmHg   Examination: General: Chronically ill appearing frail elderly male lying in bed on mechanical ventilation, in NAD HEENT: ETT, MM pink/moist, PERRL,  Neuro: Slight response to verbal stimuli, sedated on vent  CV: s1s2 regular rate and rhythm, no murmur, rubs, or gallops,  PULM:  Clear to ascultation, no added breath sounds, no increased  work of breathing  GI: soft, bowel sounds active in all 4 quadrants, non-tender, non-distended, tolerating TF Extremities: warm/dry, 1+ pitting lower extremity edema  Skin: no rashes or lesions  Assessment & Plan:   Critically ill due to acute hypoxemic and hypercarbic respiratory failure requiring mechanical ventilation P: Continue ventilator support with lung protective strategies  Wean PEEP and FiO2 for sats greater than 90%. Head of bed elevated 30 degrees. Plateau pressures less than 30 cm H20.  Follow intermittent chest x-ray and ABG.   SAT/SBT as tolerated, mentation preclude extubation  Continue to wean sedation  Ensure adequate pulmonary hygiene  Follow cultures  VAP bundle in  place  PAD protocol Volume removal per CRRT   Septic shock secondary to C. difficile colitis requiring titration of vasopressors. P: WBC seem to have peaked Trend fever curve  MAP goal > 65 Continue pressors support as need to allow for volume removal  Continue stress dose steroids, wean as pressor requirements decrease  C. difficile pancolitis -CT on admission significant for marked pancolitis  P: Will need extended vancomycin taper of 6 weeks given first reoccurrence  Continue po flagyl  No need for repeat imagine at this point   VF arrest  - s/p defib x 1 and now on amio. A.fib - intermittent -Currently in NSR P: Cardiology consulted, appreciate assistance  ECHO with EF of 40% and global hypokinesis Continue IV amiodarone and heparin drip  May consider LHC once stabilizes    Acute kidney injury with anuria  - no response to diuretics; therefore, started on CRRT 11/7 after being net +21.9L since admit.   NAGMA  - 2/2 diarrhea, resolved  Hypocalcemia  - being repleted, resolved  P: Nephrology following, appreciate assistance Follow renal function / urine output Trend Bmet Avoid nephrotoxins Ensure adequate renal perfusion  May need to decrease volume removal per CRRT getting close to net even   Transaminitis  - presumed 2/2 shock + hepatic congestion in setting profound volume overload; however, possibly exacerbated by amiodarone P: LFT's downtrending  Repeat CMP tomorrow  Avoid hepatotoxins  Supportive care  Metastatic prostate cancer with bone metastases Stable pancreatic mass -These appears stable P: Outpatient follow up   Oral thrush  P: Oral nystatin   Best practice:  Diet: TF Pain/Anxiety/Delirium protocol (if indicated): sedated on fent/ versed prn VAP protocol (if indicated): Bundle in place DVT prophylaxis: Unfractioned heparin GI prophylaxis: PPI Glucose control: Phase 1 glycemic control Mobility: bed rest Code Status: Full code Family  Communication:Wife and daughter updated at bedside 11/12 Disposition: ICU  CRITICAL CARE Performed by: Johnsie Cancel  Total critical care time: .40 minutes  Critical care time was exclusive of separately billable procedures and treating other patients.  Critical care was necessary to treat or prevent imminent or life-threatening deterioration.  Critical care was time spent personally by me on the following activities: development of treatment plan with patient and/or surrogate as well as nursing, discussions with consultants, evaluation of patient's response to treatment, examination of patient, obtaining history from patient or surrogate, ordering and performing treatments and interventions, ordering and review of laboratory studies, ordering and review of radiographic studies, pulse oximetry and re-evaluation of patient's condition.  Johnsie Cancel, NP-C [place] [facility] Contact / Pager information can be found on Amion  06/12/2020, 7:05 AM

## 2020-06-12 NOTE — Progress Notes (Signed)
Stopped by to see patient  Informed of acute neurological change and patient is on the way to stat CT head  Will follow up

## 2020-06-12 NOTE — Progress Notes (Signed)
Stat EEG complete - results pending. Skin breakdown at Pz

## 2020-06-12 NOTE — Progress Notes (Signed)
Pt transported from [place] 21 to CT and back on full vent support with no complications.

## 2020-06-13 DIAGNOSIS — I469 Cardiac arrest, cause unspecified: Secondary | ICD-10-CM | POA: Diagnosis not present

## 2020-06-13 DIAGNOSIS — G9341 Metabolic encephalopathy: Secondary | ICD-10-CM

## 2020-06-13 DIAGNOSIS — J9601 Acute respiratory failure with hypoxia: Secondary | ICD-10-CM | POA: Diagnosis not present

## 2020-06-13 DIAGNOSIS — R778 Other specified abnormalities of plasma proteins: Secondary | ICD-10-CM | POA: Diagnosis not present

## 2020-06-13 DIAGNOSIS — K7200 Acute and subacute hepatic failure without coma: Secondary | ICD-10-CM

## 2020-06-13 DIAGNOSIS — R40243 Glasgow coma scale score 3-8, unspecified time: Secondary | ICD-10-CM

## 2020-06-13 DIAGNOSIS — K529 Noninfective gastroenteritis and colitis, unspecified: Secondary | ICD-10-CM | POA: Diagnosis not present

## 2020-06-13 LAB — GLUCOSE, CAPILLARY
Glucose-Capillary: 154 mg/dL — ABNORMAL HIGH (ref 70–99)
Glucose-Capillary: 165 mg/dL — ABNORMAL HIGH (ref 70–99)
Glucose-Capillary: 169 mg/dL — ABNORMAL HIGH (ref 70–99)
Glucose-Capillary: 173 mg/dL — ABNORMAL HIGH (ref 70–99)
Glucose-Capillary: 176 mg/dL — ABNORMAL HIGH (ref 70–99)
Glucose-Capillary: 183 mg/dL — ABNORMAL HIGH (ref 70–99)
Glucose-Capillary: 184 mg/dL — ABNORMAL HIGH (ref 70–99)

## 2020-06-13 LAB — CBC
HCT: 32.6 % — ABNORMAL LOW (ref 39.0–52.0)
Hemoglobin: 10.8 g/dL — ABNORMAL LOW (ref 13.0–17.0)
MCH: 30.6 pg (ref 26.0–34.0)
MCHC: 33.1 g/dL (ref 30.0–36.0)
MCV: 92.4 fL (ref 80.0–100.0)
Platelets: 244 10*3/uL (ref 150–400)
RBC: 3.53 MIL/uL — ABNORMAL LOW (ref 4.22–5.81)
RDW: 16.5 % — ABNORMAL HIGH (ref 11.5–15.5)
WBC: 24.2 10*3/uL — ABNORMAL HIGH (ref 4.0–10.5)
nRBC: 28.1 % — ABNORMAL HIGH (ref 0.0–0.2)

## 2020-06-13 LAB — RENAL FUNCTION PANEL
Albumin: 2.4 g/dL — ABNORMAL LOW (ref 3.5–5.0)
Albumin: 2.4 g/dL — ABNORMAL LOW (ref 3.5–5.0)
Anion gap: 13 (ref 5–15)
Anion gap: 18 — ABNORMAL HIGH (ref 5–15)
BUN: 45 mg/dL — ABNORMAL HIGH (ref 8–23)
BUN: 69 mg/dL — ABNORMAL HIGH (ref 8–23)
CO2: 18 mmol/L — ABNORMAL LOW (ref 22–32)
CO2: 14 mmol/L — ABNORMAL LOW (ref 22–32)
Calcium: 7.9 mg/dL — ABNORMAL LOW (ref 8.9–10.3)
Calcium: 8 mg/dL — ABNORMAL LOW (ref 8.9–10.3)
Chloride: 104 mmol/L (ref 98–111)
Chloride: 102 mmol/L (ref 98–111)
Creatinine, Ser: 1.66 mg/dL — ABNORMAL HIGH (ref 0.61–1.24)
Creatinine, Ser: 2.46 mg/dL — ABNORMAL HIGH (ref 0.61–1.24)
GFR, Estimated: 42 mL/min — ABNORMAL LOW (ref >60)
GFR, Estimated: 26 mL/min — ABNORMAL LOW (ref >60)
Glucose, Bld: 201 mg/dL — ABNORMAL HIGH (ref 70–99)
Glucose, Bld: 193 mg/dL — ABNORMAL HIGH (ref 70–99)
Phosphorus: 2.9 mg/dL (ref 2.5–4.6)
Phosphorus: 5.2 mg/dL — ABNORMAL HIGH (ref 2.5–4.6)
Potassium: 3.7 mmol/L (ref 3.5–5.1)
Potassium: 3.7 mmol/L (ref 3.5–5.1)
Sodium: 135 mmol/L (ref 135–145)
Sodium: 134 mmol/L — ABNORMAL LOW (ref 135–145)

## 2020-06-13 LAB — HEPARIN LEVEL (UNFRACTIONATED): Heparin Unfractionated: 0.37 IU/mL (ref 0.30–0.70)

## 2020-06-13 MED ORDER — HEPARIN SODIUM (PORCINE) 5000 UNIT/ML IJ SOLN
INTRAMUSCULAR | Status: AC
  Filled 2020-06-13: qty 1

## 2020-06-13 NOTE — Progress Notes (Signed)
Samuel Gregory  Assessment/ Plan: Pt is a 79 y.o. yo male with CAD, atrial flutter, CKD, HTN, HLD, prostate cancer who present w/ C. difficile diarrhea, shock, cardiac arrest.  #Oliguric AKI on CKD stage III due to ATN after cardiac arrest/shock: Did not respond with IV diuretics and hemodynamic support.  Started CRRT on 11/7.  Tolerated UF well.  Now becoming hypotensive and volume status is improving.  CVP is low.  Had minimal positive overnight.  I will hold CRRT today and reassess in the morning.  Discussed with ICU nurse. CT scan without hydronephrosis. Monitor urine output and labs.  #Acute hypoxic, hypercapnic respiratory failure requiring mechanical ventilation: PCCM is following.  #VF arrest/acute systolic heart failure: Cardiology is following.  Considering left heart cath when he is a stable.  #Septic shock due to C. difficile colitis: Antibiotics per primary team.  On pressors including Levophed and vasopressin.  Maintaining MAP more than 65.  #History of metastatic prostate cancer with bone metastasis and stable pancreatic mass.  # Non-anion gap metabolic acidosis associated with diarrhea.  Bicarb was as low as 12.    Improved now.  #Hyponatremia/hypochloremia: Likely related to free water retention in setting of AKI as well as isotonic fluid loss from diarrhea.  Now managed with CRRT.  # Hypocalcemia:   low albumin level.  Replete calcium.  #Hypophosphatemia: Repleted phosphorus.  Monitor lab.  #Shock liver.  Trend liver enzymes.  Subjective: Seen and examined ICU.  CVP was low yesterday and became being hypotensive.  CRRT was running with some positive overnight.  Still has some lower extremity edema but had significant UF last few days.  Had a code stroke yesterday and neurology following. Objective Vital signs in last 24 hours: Vitals:   06/13/20 0700 06/13/20 0727 06/13/20 0800 06/13/20 0900  BP:      Pulse: 78 80 83 79   Resp: 17 17 18 14   Temp:   97.9 F (36.6 C)   TempSrc:   Axillary   SpO2: 100% 100% 100% 100%  Weight:      Height:       Weight change: 2 kg  Intake/Output Summary (Last 24 hours) at 06/13/2020 0906 Last data filed at 06/13/2020 0900 Gross per 24 hour  Intake 2329.36 ml  Output 3286 ml  Net -956.64 ml       Labs: Basic Metabolic Panel: Recent Labs  Lab 06/12/20 0423 06/12/20 1557 06/13/20 0316  NA 133* 135 135  K 4.5 4.5 3.7  CL 100 101 104  CO2 18* 21* 18*  GLUCOSE 206* 173* 201*  BUN 42* 45* 45*  CREATININE 1.66* 1.74* 1.66*  CALCIUM 8.0* 8.0* 7.9*  PHOS 3.0 3.6 2.9   Liver Function Tests: Recent Labs  Lab 06/09/20 0447 06/09/20 1511 06/10/20 0430 06/10/20 1535 06/11/20 0416 06/11/20 1600 06/12/20 0423 06/12/20 1557 06/13/20 0316  AST 4,444*  --  1,491*  --  351*  --   --   --   --   ALT 1,487*  --  1,225*  --  708*  --   --   --   --   ALKPHOS 270*  --  467*  --  494*  --   --   --   --   BILITOT 1.3*  --  1.2  --  1.3*  --   --   --   --   PROT 4.7*  --  5.3*  --  5.6*  --   --   --   --  ALBUMIN 1.8*  1.8*   < > 2.0*  2.0*   < > 2.8*  2.8*   < > 2.7* 2.7* 2.4*   < > = values in this interval not displayed.   No results for input(s): LIPASE, AMYLASE in the last 168 hours. No results for input(s): AMMONIA in the last 168 hours. CBC: Recent Labs  Lab 06/09/20 0447 06/09/20 1136 06/10/20 0430 06/10/20 0430 06/11/20 0416 06/12/20 0423 06/13/20 0316  WBC 13.7*  --  20.5*   < > 29.6* 29.3* 24.2*  HGB 10.2*   < > 11.6*   < > 11.2* 11.3* 10.8*  HCT 30.5*   < > 35.6*   < > 34.2* 34.3* 32.6*  MCV 93.0  --  94.7  --  94.7 95.0 92.4  PLT 179  --  245   < > 264 280 244   < > = values in this interval not displayed.   Cardiac Enzymes: No results for input(s): CKTOTAL, CKMB, CKMBINDEX, TROPONINI in the last 168 hours. CBG: Recent Labs  Lab 06/12/20 1604 06/12/20 2010 06/12/20 2308 06/13/20 0320 06/13/20 0830  GLUCAP 161* 149* 177*  184* 176*    Iron Studies: No results for input(s): IRON, TIBC, TRANSFERRIN, FERRITIN in the last 72 hours. Studies/Results: DG CHEST PORT 1 VIEW  Result Date: 06/12/2020 CLINICAL DATA:  Encounter for central line placement. EXAM: PORTABLE CHEST 1 VIEW COMPARISON:  06/10/2020 FINDINGS: There is a left IJ catheter with tip at the cavoatrial junction. No pneumothorax identified. Feeding tube tip is below the level of the GE junction. Stable ET tube with tip above the carina. Normal heart size. Atelectasis/airspace consolidation within the left base appears partially improved from previous exam. Right lung is clear IMPRESSION: 1. Satisfactory position of left IJ catheter with tip at the cavoatrial junction. 2. Improved aeration to the left base. Electronically Signed   By: Kerby Moors M.D.   On: 06/12/2020 14:03   EEG adult  Result Date: 06/12/2020 Lora Havens, MD     06/12/2020  9:08 PM Patient Name: Samuel Gregory MRN: [id number] Epilepsy Attending: Lora Havens Referring Physician/Provider: Dr Lynnae Sandhoff Date: 06/12/2020 Duration: 34.50 mins Patient history: 79yo M with sudden onset of unresponsiveness and seizure like activity. EEG to evaluate for seizure Level of alertness:  comatose AEDs during EEG study: Ativan, LEV Technical aspects: This EEG study was done with scalp electrodes positioned according to the 10-20 International system of electrode placement. Electrical activity was acquired at a sampling rate of 500Hz  and reviewed with a high frequency filter of 70Hz  and a low frequency filter of 1Hz . EEG data were recorded continuously and digitally stored. Description: EEG showed continuous generalized low amplitude rhythmic 2-3Hz  delta slowing with intermittent 9-10 hz generalized alpha activity.  Hyperventilation and photic stimulation were not performed.   ABNORMALITY -Continuous delta slow, generalized IMPRESSION: This study is suggestive of severe diffuse encephalopathy,  nonspecific etiology but likely related to sedation, post-ictal state. No seizures or epileptiform discharges were seen throughout the recording. Lora Havens   CT HEAD CODE STROKE WO CONTRAST  Result Date: 06/12/2020 CLINICAL DATA:  Code stroke.  Sudden onset of unresponsiveness. EXAM: CT HEAD WITHOUT CONTRAST TECHNIQUE: Contiguous axial images were obtained from the base of the skull through the vertex without intravenous contrast. COMPARISON:  None. FINDINGS: Brain: There is no evidence of an acute infarct, intracranial hemorrhage, mass, midline shift, or extra-axial fluid collection. The ventricles and sulci are within normal limits for age.  Hypodensities in the cerebral white matter bilaterally are nonspecific but compatible with mild chronic small vessel ischemic disease. Vascular: Calcified atherosclerosis at the skull base. No asymmetric vessel hyperdensity. Skull: No fracture or suspicious osseous lesion. Sinuses/Orbits: Small volume fluid in the sphenoid sinuses. Small left maxillary sinus mucous retention cyst. Large bilateral mastoid and middle ear effusions in the setting of intubation. Bilateral cataract extraction. Other: None. ASPECTS [facility] Stroke Program Early CT Score) - Ganglionic level infarction (caudate, lentiform nuclei, internal capsule, insula, M1-M3 cortex): 7 - Supraganglionic infarction (M4-M6 cortex): 3 Total score (0-10 with 10 being normal): 10 IMPRESSION: 1. No evidence of acute intracranial abnormality. 2. ASPECTS is 10. 3. Mild chronic small vessel ischemic disease. These results were communicated to Dr. Theda Sers at 6:22 pm on 06/12/2020 by text page via the [facility] messaging system. Electronically Signed   By: Logan Bores M.D.   On: 06/12/2020 18:23   CT ANGIO HEAD CODE STROKE  Result Date: 06/12/2020 CLINICAL DATA:  Sudden onset of unresponsiveness. EXAM: CT ANGIOGRAPHY HEAD AND NECK TECHNIQUE: Multidetector CT imaging of the head and neck was performed using the  standard protocol during bolus administration of intravenous contrast. Multiplanar CT image reconstructions and MIPs were obtained to evaluate the vascular anatomy. Carotid stenosis measurements (when applicable) are obtained utilizing NASCET criteria, using the distal internal carotid diameter as the denominator. CONTRAST:  60m OMNIPAQUE IOHEXOL 350 MG/ML SOLN COMPARISON:  None. FINDINGS: CTA NECK FINDINGS Aortic arch: Standard 3 vessel aortic arch with mild atherosclerotic plaque. Widely patent arch vessel origins. Right carotid system: Patent with mild calcified plaque at the carotid bifurcation. No evidence of significant stenosis or dissection. Left carotid system: Patent with mild calcified plaque at the carotid bifurcation. No evidence of significant stenosis or dissection. Vertebral arteries: Patent and codominant without evidence of flow limiting stenosis or dissection. Calcified plaque at the vertebral artery origins resulting in at most mild stenosis. Skeleton: Moderate lower cervical disc degeneration. Focally advanced right facet arthrosis at C3-4 with grade 1 anterolisthesis. Other neck: No evidence of cervical lymphadenopathy or mass. Upper chest: Endotracheal tube terminating above the carina. Partially visualized secretions in the distal trachea and proximal mainstem bronchi. Partially visualized left lower lobe collapse and small left pleural effusion. Partially visualized enteric tube in the esophagus. Review of the MIP images confirms the above findings CTA HEAD FINDINGS Anterior circulation: The internal carotid arteries are patent from skull base to carotid termini with calcified plaque resulting in mild paraclinoid stenosis on the right. ACAs and MCAs are patent without evidence of a proximal branch occlusion or significant proximal stenosis. No aneurysm is identified. Posterior circulation: The intracranial vertebral arteries are patent to the basilar with mild stenosis bilaterally due to  atherosclerotic plaque. Patent PICA and SCA origins are seen bilaterally. The basilar artery is widely patent with an incidental fenestration noted proximally. Posterior communicating arteries are diminutive or absent. Both PCAs are patent without evidence of a significant proximal stenosis. No aneurysm is identified. Venous sinuses: Not evaluated due to arterial contrast timing. Anatomic variants: None. Review of the MIP images confirms the above findings IMPRESSION: 1. Mild atherosclerosis in the head and neck without large vessel occlusion or flow limiting stenosis. 2. Partially visualized left lower lobe collapse and small left pleural effusion. 3.  Aortic Atherosclerosis (ICD10-I70.0). These results were communicated to Dr. CTheda Sersat 7:01 pm on 06/12/2020 by text page via the [facility]. Electronically Signed   By: ALogan BoresM.D.   On: 06/12/2020 19:11  CT ANGIO NECK CODE STROKE  Result Date: 06/12/2020 CLINICAL DATA:  Sudden onset of unresponsiveness. EXAM: CT ANGIOGRAPHY HEAD AND NECK TECHNIQUE: Multidetector CT imaging of the head and neck was performed using the standard protocol during bolus administration of intravenous contrast. Multiplanar CT image reconstructions and MIPs were obtained to evaluate the vascular anatomy. Carotid stenosis measurements (when applicable) are obtained utilizing NASCET criteria, using the distal internal carotid diameter as the denominator. CONTRAST:  26m OMNIPAQUE IOHEXOL 350 MG/ML SOLN COMPARISON:  None. FINDINGS: CTA NECK FINDINGS Aortic arch: Standard 3 vessel aortic arch with mild atherosclerotic plaque. Widely patent arch vessel origins. Right carotid system: Patent with mild calcified plaque at the carotid bifurcation. No evidence of significant stenosis or dissection. Left carotid system: Patent with mild calcified plaque at the carotid bifurcation. No evidence of significant stenosis or dissection. Vertebral arteries: Patent and codominant  without evidence of flow limiting stenosis or dissection. Calcified plaque at the vertebral artery origins resulting in at most mild stenosis. Skeleton: Moderate lower cervical disc degeneration. Focally advanced right facet arthrosis at C3-4 with grade 1 anterolisthesis. Other neck: No evidence of cervical lymphadenopathy or mass. Upper chest: Endotracheal tube terminating above the carina. Partially visualized secretions in the distal trachea and proximal mainstem bronchi. Partially visualized left lower lobe collapse and small left pleural effusion. Partially visualized enteric tube in the esophagus. Review of the MIP images confirms the above findings CTA HEAD FINDINGS Anterior circulation: The internal carotid arteries are patent from skull base to carotid termini with calcified plaque resulting in mild paraclinoid stenosis on the right. ACAs and MCAs are patent without evidence of a proximal branch occlusion or significant proximal stenosis. No aneurysm is identified. Posterior circulation: The intracranial vertebral arteries are patent to the basilar with mild stenosis bilaterally due to atherosclerotic plaque. Patent PICA and SCA origins are seen bilaterally. The basilar artery is widely patent with an incidental fenestration noted proximally. Posterior communicating arteries are diminutive or absent. Both PCAs are patent without evidence of a significant proximal stenosis. No aneurysm is identified. Venous sinuses: Not evaluated due to arterial contrast timing. Anatomic variants: None. Review of the MIP images confirms the above findings IMPRESSION: 1. Mild atherosclerosis in the head and neck without large vessel occlusion or flow limiting stenosis. 2. Partially visualized left lower lobe collapse and small left pleural effusion. 3.  Aortic Atherosclerosis (ICD10-I70.0). These results were communicated to Dr. CTheda Sersat 7:01 pm on 06/12/2020 by text page via the [facility] system. Electronically  Signed   By: ALogan BoresM.D.   On: 06/12/2020 19:11    Medications: Infusions: . sodium chloride 5 mL/hr at 06/11/20 1800  . amiodarone 30 mg/hr (06/13/20 0900)  . dexmedetomidine (PRECEDEX) IV infusion Stopped (06/12/20 1752)  . dextrose Stopped (06/07/20 2017)  . famotidine (PEPCID) IV Stopped (06/12/20 1018)  . feeding supplement (VITAL AF 1.2 CAL) 1,000 mL (06/12/20 2123)  . heparin 650 Units/hr (06/13/20 0900)  . levETIRAcetam    . norepinephrine (LEVOPHED) Adult infusion Stopped (06/13/20 0806)  . vasopressin Stopped (06/12/20 0950)    Scheduled Medications: . aspirin  162 mg Per Tube Daily  . B-complex with vitamin C  1 tablet Per Tube Daily  . chlorhexidine gluconate (MEDLINE KIT)  15 mL Mouth Rinse BID  . Chlorhexidine Gluconate Cloth  6 each Topical Daily  . feeding supplement (PROSource TF)  45 mL Per Tube TID  . hydrocortisone sod succinate (SOLU-CORTEF) inj  50 mg Intravenous Q6H  . hydroxypropyl methylcellulose /  hypromellose  1 drop Both Eyes TID  . insulin aspart  1-3 Units Subcutaneous Q4H  . insulin aspart  3 Units Subcutaneous Q4H  . lactobacillus acidophilus  2 tablet Per Tube TID  . mouth rinse  15 mL Mouth Rinse 10 times per day  . metroNIDAZOLE  500 mg Per Tube Q8H  . nystatin  5 mL Oral QID  . sodium chloride flush  10-40 mL Intracatheter Q12H  . vancomycin  125 mg Per Tube QID   Followed by  . [START ON 06/25/2020] vancomycin  125 mg Per Tube BID   Followed by  . [START ON 07/03/2020] vancomycin  125 mg Per Tube Daily   Followed by  . [START ON 07/10/2020] vancomycin  125 mg Per Tube QODAY   Followed by  . [START ON 07/18/2020] vancomycin  125 mg Per Tube Q3 days    have reviewed scheduled and prn medications.  Physical Exam: General: Intubated, sedated, unresponsive Heart:RRR, s1s2 nl Lungs: Coarse breath sound bilateral, Abdomen: Soft, nondistended Extremities:  lower extremity edema is improving Dialysis Access: Groin catheter.  Samuel Gregory  Reesa Chew Zyad Boomer 06/13/2020,9:06 AM  LOS: 9 days  Pager: [phone]

## 2020-06-13 NOTE — Progress Notes (Signed)
NAME:  Samuel Gregory, MRN:  [id number], DOB:  [birth year], LOS: 9 ADMISSION DATE:  06/11/2020, CONSULTATION DATE:  11/5 REFERRING MD:  Tat/Triad, CHIEF COMPLAINT:  Shock/ resp failure from [facility]  Brief History   71 yowm never smoker with h/o prostate ca and prior c diff infection admitted 11/4 with voluminous diarrhea and abd pain and coded in ER   VF arrest > admit to ICU with pancolitis on admit CT / vent dep/ pressor dep and PCCM consulted am 11/5   History of present illness   79 y.o. male with past medical history of atrial fibrillation on Eliquis, coronary artery disease status post CABG in 2000, hypertension, hyperlipidemia, GERD, HLD,metastatic prostate cancer and history of C. difficile infection x1, who presented for evaluation of acute onset of diarrhea and arrested as w/u in progress.   Per his daugher he was ambulatory prior to acute illness   Past Medical History  Arthritis  A-fib CAD S.P CABG 2000 HTN HLD Prostate cancer    Significant Hospital Events   VF arrest pm 11/4 > amiodarone drip 11/5 shock due to pancolitis  Consults:  PCCM 11/5  Urology 11/5 Cards   11/5   Procedures:  ET  11/5 > R IJ  11/5 > 11/6 R Rad Art line 11/5 > 11/7 L fem art line 11/7 >  L fem HD cath 11/7 >   Significant Diagnostic Tests:  CT abd 11/4 1 > Marked pancolitis that is worse of the rectosigmoid colon. Etiology could be infectious, inflammatory, ischemic. No associated bowel obstruction or perforation. Correlate with stool cultures. Other imaging findings of potential clinical significance: Cholelithiasis with no acute cholecystitis. 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 Small right inguinal hernia containing fat and fluid. Aortic Atherosclerosis  Micro Data:  COVID 19  PCR > 11/4 Neg  Flu A/B  PCR  > 11/4  Neg  Stool 11/4 > neg  C diff antigen/ toxix 11/4 > Both POS BC x 2  11/4 > MRSA PCR 11/5 > neg   Antimicrobials:  Rocephin  11/4    Cipro  11/4  Flagyl  11/4  >>> Zosyn  11/4  >>> Vanc per rectalog  tube11/5  >>>  Interim history/subjective:  Did have altered mentation yesterday, concern for stroke-CT head negative Concern for seizures-started on Keppra, did receive Ativan EEG showed generalized slowing No other events overnight Exhibits spontaneous movements  Objective   Blood pressure 130/66, pulse 79, temperature 97.9 F (36.6 C), temperature source Axillary, resp. rate 14, height 5\' 6"  (1.676 m), weight 69 kg, SpO2 100 %. CVP:  [1 mmHg-6 mmHg] 3 mmHg  Vent Mode: PSV;CPAP FiO2 (%):  [40 %] 40 % Set Rate:  [18 bmp] 18 bmp Vt Set:  [510 mL] 510 mL PEEP:  [5 cmH20] 5 cmH20 Pressure Support:  [5 cmH20-10 cmH20] 10 cmH20 Plateau Pressure:  [18 cmH20-20 cmH20] 20 cmH20   Intake/Output Summary (Last 24 hours) at 06/13/2020 1037 Last data filed at 06/13/2020 0900 Gross per 24 hour  Intake 2217.39 ml  Output 3091 ml  Net -873.61 ml   Filed Weights   06/11/20 0600 06/12/20 0453 06/13/20 0515  Weight: 65.5 kg 67 kg 69 kg   CVP:  [1 mmHg-6 mmHg] 3 mmHg   Examination: General: Chronically ill-appearing HEENT: Endotracheal tube in place Neuro: Not following commands CV: S1-S2 appreciated PULM: Clear to auscultation GI: Soft, bowel sounds appreciated Extremities: 1+ edema Skin: no rashes or lesions  Assessment &  Plan:   Critically ill due to acute hypoxic and hypercarbic respiratory failure requiring mechanical ventilation -Continue full vent support -Wean as tolerated -VAP bundle in place  C. difficile pancolitis -Continue vancomycin-6-week course of treatment plan -Continue p.o. Flagyl  Septic shock secondary to C. difficile colitis requiring vasopressors -Titrate off pressors as tolerated -Maintain MAP goal greater than 65  Septic shock secondary to C. difficile colitis requiring titration of vasopressors -Continue pressors -Continue stress dose steroids  V. fib arrest -Status post  defibrillation  Intermittent atrial fibrillation -Continue amiodarone -Continue heparin  Acute kidney injury with anuria -On renal replacement therapy  Acute kidney injury with anuria  - no response to diuretics; therefore, started on CRRT 11/7 after being net +21.9L since admit.   NAGMA  - 2/2 diarrhea, resolved  Hypocalcemia  - being repleted, resolved  P: Nephrology following, appreciate assistance Follow renal function / urine output Trend Bmet Avoid nephrotoxins Ensure adequate renal perfusion  May need to decrease volume removal per CRRT getting close to net even   Transaminitis  - presumed 2/2 shock + hepatic congestion in setting profound volume overload; however, possibly exacerbated by amiodarone P: LFT's downtrending  Repeat CMP tomorrow  Avoid hepatotoxins  Supportive care  Metastatic prostate cancer  thrush   Best practice:  Diet: TF Pain/Anxiety/Delirium protocol (if indicated): sedated on fent/ versed prn VAP protocol (if indicated): Bundle in place DVT prophylaxis: Unfractioned heparin GI prophylaxis: PPI Glucose control: Phase 1 glycemic control Mobility: bed rest Code Status: Full code Family Communication:Wife and daughter updated at bedside 11/12 Disposition: ICU  The patient is critically ill with multiple organ systems failure and requires high complexity decision making for assessment and support, frequent evaluation and titration of therapies, application of advanced monitoring technologies and extensive interpretation of multiple databases. Critical Care Time devoted to patient care services described in this note independent of APP/resident time (if applicable)  is 30 minutes.   Sherrilyn Rist MD [place] [facility] Personal pager: [phone] If unanswered, please page CCM On-call: [phone]

## 2020-06-13 NOTE — Progress Notes (Signed)
Neurology Progress Note  S: No overnight events. Patient was taken off CRRT and reports he has exhibited spontaneous limb and head movement with chewing.   O: Current vital signs: BP 130/66 Comment: aline  Pulse 90   Temp 97.9 F (36.6 C) (Axillary)   Resp (!) 24   Ht _0  (1.676 m)   Wt 69 kg   SpO2 98%   BMI 24.55 kg/m  Vital signs in last 24 hours: Temp:  [95.9 F (35.5 C)-98.8 F (37.1 C)] 97.9 F (36.6 C) (11/13 0800) Pulse Rate:  [72-90] 90 (11/13 1200) Resp:  [14-32] 24 (11/13 1200) BP: (119-134)/(57-66) 130/66 (11/13 0359) SpO2:  [98 %-100 %] 98 % (11/13 1200) Arterial Line BP: (75-157)/(37-87) 122/54 (11/13 1200) FiO2 (%):  [40 %] 40 % (11/13 1107) Weight:  [69 kg] 69 kg (11/13 0515)  Constitutional: Appears well-developed and well-nourished.  Psych: Unable to assess due to intubation and altered mental status.  Cardiovascular: Normal rate and regular rhythm.  GI: Soft.  No distension. There is no tenderness.  Skin: WDI  Neuro: Eyes: Pupils ~7m equal, round, and reactive to light. subconjunctival hemorrhage left eye, both eyes midline able to track in horizontal plane however not consistently. Occasional head turn and chewing. Tone is now normal without spontaneous or meaningful movements.  Sensory Unable to assess due to intubation and altered mental status Deep Tendon Reflexes:mute Plantars: Toes are downgoing bilaterally. Cerebellar: Unable to assess due to intubation and altered mental status  Medications  Current Facility-Administered Medications:  .  0.9 %  sodium chloride infusion, 250 mL, Intravenous, Continuous, Tat, David, MD, Last Rate: 5 mL/hr at 06/13/20 1000, Rate Verify at 06/13/20 1000 .  alteplase (CATHFLO ACTIVASE) injection 2 mg, 2 mg, Intracatheter, Once PRN, SRoney Jaffe MD .  amiodarone (NEXTERONE PREMIX) 360-4.14 MG/200ML-% (1.8 mg/mL) IV infusion, 30 mg/hr, Intravenous, Continuous, Desai, Rahul P, PA-C, Last Rate: 16.67 mL/hr  at 06/13/20 1202, 30 mg/hr at 06/13/20 1202 .  aspirin chewable tablet 162 mg, 162 mg, Per Tube, Daily, Tat, David, MD, 162 mg at 06/13/20 1031 .  B-complex with vitamin C tablet 1 tablet, 1 tablet, Per Tube, Daily, AKipp Brood MD, 1 tablet at 06/13/20 1031 .  chlorhexidine gluconate (MEDLINE KIT) (PERIDEX) 0.12 % solution 15 mL, 15 mL, Mouth Rinse, BID, Tat, David, MD, 15 mL at 06/13/20 0813 .  Chlorhexidine Gluconate Cloth 2 % PADS 6 each, 6 each, Topical, Daily, Tat, David, MD, 6 each at 06/12/20 2127 .  dexmedetomidine (PRECEDEX) 400 MCG/100ML (4 mcg/mL) infusion, 0.4-1.2 mcg/kg/hr, Intravenous, Titrated, DMerlene LaughterF, NP, Stopped at 06/12/20 1752 .  dextrose 10 % infusion, , Intravenous, Continuous, Agarwala, Ravi, MD, Stopping Infusion hung by another clincian at 06/07/20 2017 .  famotidine (PEPCID) IVPB 20 mg premix, 20 mg, Intravenous, Q24H, DMerlene LaughterF, NP, Last Rate: 100 mL/hr at 06/13/20 1032, 20 mg at 06/13/20 1032 .  feeding supplement (PROSource TF) liquid 45 mL, 45 mL, Per Tube, TID, Agarwala, Ravi, MD, 45 mL at 06/13/20 1031 .  feeding supplement (VITAL AF 1.2 CAL) liquid 1,000 mL, 1,000 mL, Per Tube, Continuous, Agarwala, Ravi, MD, Last Rate: 60 mL/hr at 06/13/20 1000, Rate Verify at 06/13/20 1000 .  fentaNYL (SUBLIMAZE) injection 25 mcg, 25 mcg, Intravenous, Q15 min PRN, DMerlene LaughterF, NP .  fentaNYL (SUBLIMAZE) injection 25-100 mcg, 25-100 mcg, Intravenous, Q30 min PRN, DMerlene LaughterF, NP, 50 mcg at 06/12/20 0951 .  heparin 5000 UNIT/ML injection, , , ,  .  heparin ADULT infusion 100 units/mL (25000 units/265m sodium chloride 0.45%), 650 Units/hr, Intravenous, Continuous, Amend, CSanda Klein RPH, Last Rate: 6.5 mL/hr at 06/13/20 1000, 650 Units/hr at 06/13/20 1000 .  heparin injection 1,000-6,000 Units, 1,000-6,000 Units, CRRT, PRN, SRoney Jaffe MD, 3,000 Units at 06/13/20 1028 .  hydrocortisone sodium succinate (SOLU-CORTEF) 100 MG injection 50 mg, 50 mg,  Intravenous, Q6H, Tat, David, MD, 50 mg at 06/13/20 0840 .  hydroxypropyl methylcellulose / hypromellose (ISOPTO TEARS / GONIOVISC) 2.5 % ophthalmic solution 1 drop, 1 drop, Both Eyes, TID, Olalere, Adewale A, MD, 1 drop at 06/13/20 1052 .  insulin aspart (novoLOG) injection 1-3 Units, 1-3 Units, Subcutaneous, Q4H, AKipp Brood MD, 2 Units at 06/13/20 1225 .  insulin aspart (novoLOG) injection 3 Units, 3 Units, Subcutaneous, Q4H, Agarwala, Ravi, MD, 3 Units at 06/13/20 1225 .  lactobacillus acidophilus (BACID) tablet 2 tablet, 2 tablet, Per Tube, TID, DMerlene LaughterF, NP, 2 tablet at 06/13/20 1033 .  levETIRAcetam (KEPPRA) IVPB 1500 mg/ 100 mL premix, 1,500 mg, Intravenous, Q12H, CGwinda Maine MD, Last Rate: 400 mL/hr at 06/13/20 1123, 1,500 mg at 06/13/20 1123 .  MEDLINE mouth rinse, 15 mL, Mouth Rinse, 10 times per day, Tat, DShanon Brow MD, 15 mL at 06/13/20 1205 .  metroNIDAZOLE (FLAGYL) 50 mg/ml oral suspension 500 mg, 500 mg, Per Tube, Q8H, DMerlene LaughterF, NP, 500 mg at 06/13/20 1210 .  norepinephrine (LEVOPHED) 16 mg in 2546mpremix infusion, 0-40 mcg/min, Intravenous, Titrated, TaOrson EvaMD, Stopped at 06/13/20 [id number]  nystatin (MYCOSTATIN) 100000 UNIT/ML suspension 500,000 Units, 5 mL, Oral, QID, DaMerlene Laughter, NP, 500,000 Units at 06/13/20 1030 .  sodium chloride 0.9 % primer fluid for CRRT, , CRRT, PRN, ScRoney JaffeMD .  sodium chloride flush (NS) 0.9 % injection 10-40 mL, 10-40 mL, Intracatheter, Q12H, Tat, David, MD, 10 mL at 06/13/20 1029 .  sodium chloride flush (NS) 0.9 % injection 10-40 mL, 10-40 mL, Intracatheter, PRN, Tat, David, MD .  vancomycin (VANCOCIN) 50 mg/mL oral solution 125 mg, 125 mg, Per Tube, QID, 125 mg at 06/13/20 0849 **FOLLOWED BY** [START ON 06/25/2020] vancomycin (VANCOCIN) 50 mg/mL oral solution 125 mg, 125 mg, Per Tube, BID **FOLLOWED BY** [START ON 07/03/2020] vancomycin (VANCOCIN) 50 mg/mL oral solution 125 mg, 125 mg, Per Tube, Daily **FOLLOWED  BY** [START ON 07/10/2020] vancomycin (VANCOCIN) 50 mg/mL oral solution 125 mg, 125 mg, Per Tube, QODAY **FOLLOWED BY** [START ON 07/18/2020] vancomycin (VANCOCIN) 50 mg/mL oral solution 125 mg, 125 mg, Per Tube, Q3 days, DaRosana HoesWhitney F, NP .  vasopressin (PITRESSIN) 20 Units in sodium chloride 0.9 % 100 mL infusion-*FOR SHOCK*, 0-0.03 Units/min, Intravenous, Continuous, Tat, DaShanon BrowMD, Stopped at 06/12/20 0950 Labs CBC    Component Value Date/Time   WBC 24.2 (H) 06/13/2020 0316   RBC 3.53 (L) 06/13/2020 0316   HGB 10.8 (L) 06/13/2020 0316   HCT 32.6 (L) 06/13/2020 0316   PLT 244 06/13/2020 0316   MCV 92.4 06/13/2020 0316   MCH 30.6 06/13/2020 0316   MCHC 33.1 06/13/2020 0316   RDW 16.5 (H) 06/13/2020 0316   LYMPHSABS 2.7 06/24/2020 2016   MONOABS 0.9 06/03/2020 2016   EOSABS 0.0 06/15/2020 2016   BASOSABS 0.1 06/11/2020 2016    CMP     Component Value Date/Time   NA 135 06/13/2020 0316   K 3.7 06/13/2020 0316   CL 104 06/13/2020 0316   CO2 18 (L) 06/13/2020 0316   GLUCOSE 201 (H) 06/13/2020 0316   BUN  45 (H) 06/13/2020 0316   CREATININE 1.66 (H) 06/13/2020 0316   CREATININE 1.09 09/08/2014 1504   CALCIUM 7.9 (L) 06/13/2020 0316   PROT 5.6 (L) 06/11/2020 0416   ALBUMIN 2.4 (L) 06/13/2020 0316   AST 351 (H) 06/11/2020 0416   ALT 708 (H) 06/11/2020 0416   ALKPHOS 494 (H) 06/11/2020 0416   BILITOT 1.3 (H) 06/11/2020 0416   GFRNONAA 42 (L) 06/13/2020 0316   GFRAA >60 02/05/2020 0413   Assessment: AREG BIALAS is a 79 y.o. critically ill man and complicated medical course on ventilator acutely unresponsive and initially with fixed up gaze and diffusely clonic which has now improved however remains unresponsive. It is not clear if this was provoked seizure in setting of metabolic disturbance given improvement with BZD and LEV however, EEG was negative for epileptiform discharges. Reports of spontaneous arm and head movement and today's exam with eyes possibly tracking  inconsistently in the horizontal plane. MRI brain may be revealing and will await MRI results.   Recommendations: - Continue levetiracetam 1,070m two times daily for now. - MRI Brain. - Continue to correct metabolic derangement. - Will continue to follow.   HLynnae Sandhoff MD Page: [phone]

## 2020-06-13 NOTE — Consult Note (Signed)
[facility] for Heparin Indication: atrial fibrillation  Allergies  Allergen Reactions  . Latex Rash  . Sulfa Antibiotics Rash    Patient Measurements: Height: 5\' 6"  (167.6 cm) Weight: 69 kg (152 lb 1.9 oz) IBW/kg (Calculated) : 63.8 Heparin Dosing Weight: 82.7 kg  Vital Signs: Temp: 97.9 F (36.6 C) (11/13 0800) Temp Source: Axillary (11/13 0800) BP: 130/66 (11/13 0359) Pulse Rate: 80 (11/13 1000)  Labs: Recent Labs    06/11/20 0416 06/11/20 1600 06/12/20 0423 06/12/20 1557 06/12/20 2304 06/13/20 0316 06/13/20 0826  HGB 11.2*  --  11.3*  --   --  10.8*  --   HCT 34.2*  --  34.3*  --   --  32.6*  --   PLT 264  --  280  --   --  244  --   HEPARINUNFRC 0.38  --  0.10*  --  0.25*  --  0.37  CREATININE 1.55*   < > 1.66* 1.74*  --  1.66*  --    < > = values in this interval not displayed.    Estimated Creatinine Clearance: 32.6 mL/min (A) (by C-G formula based on SCr of 1.66 mg/dL (H)).  Assessment: 79 y.o. male with h/o Afib on apixaban prior to admit. Patient transitioned over to heparin.   Heparin level now at goal this morning (0.37) on gtt at 650 units/hr. No issues with line or bleeding reported per RN.   Goal of Therapy:  Heparin level 0.3-0.7 units/mL Monitor platelets by anticoagulation protocol: Yes   Plan:  Continue Heparin at 650 units/hr Daily heparin level and CBC  Erin Hearing PharmD., BCPS Clinical Pharmacist 06/13/2020 11:16 AM

## 2020-06-13 NOTE — Plan of Care (Signed)
  Problem: Education: Goal: Knowledge of General Education information will improve Description: Including pain rating scale, medication(s)/side effects and non-pharmacologic comfort measures Outcome: Progressing   Problem: Clinical Measurements: Goal: Ability to maintain clinical measurements within normal limits will improve Outcome: Progressing   

## 2020-06-14 DIAGNOSIS — G9340 Encephalopathy, unspecified: Secondary | ICD-10-CM

## 2020-06-14 DIAGNOSIS — I469 Cardiac arrest, cause unspecified: Secondary | ICD-10-CM | POA: Diagnosis not present

## 2020-06-14 DIAGNOSIS — K529 Noninfective gastroenteritis and colitis, unspecified: Secondary | ICD-10-CM | POA: Diagnosis not present

## 2020-06-14 DIAGNOSIS — J9601 Acute respiratory failure with hypoxia: Secondary | ICD-10-CM | POA: Diagnosis not present

## 2020-06-14 DIAGNOSIS — R778 Other specified abnormalities of plasma proteins: Secondary | ICD-10-CM | POA: Diagnosis not present

## 2020-06-14 LAB — GLUCOSE, CAPILLARY
Glucose-Capillary: 193 mg/dL — ABNORMAL HIGH (ref 70–99)
Glucose-Capillary: 196 mg/dL — ABNORMAL HIGH (ref 70–99)

## 2020-06-14 LAB — CBC
HCT: 31.6 % — ABNORMAL LOW (ref 39.0–52.0)
Hemoglobin: 10.5 g/dL — ABNORMAL LOW (ref 13.0–17.0)
MCH: 31.2 pg (ref 26.0–34.0)
MCHC: 33.2 g/dL (ref 30.0–36.0)
MCV: 93.8 fL (ref 80.0–100.0)
Platelets: 249 10*3/uL (ref 150–400)
RBC: 3.37 MIL/uL — ABNORMAL LOW (ref 4.22–5.81)
RDW: 16.9 % — ABNORMAL HIGH (ref 11.5–15.5)
WBC: 21.8 10*3/uL — ABNORMAL HIGH (ref 4.0–10.5)
nRBC: 43.1 % — ABNORMAL HIGH (ref 0.0–0.2)

## 2020-06-14 LAB — RENAL FUNCTION PANEL
Albumin: 2.2 g/dL — ABNORMAL LOW (ref 3.5–5.0)
Anion gap: 16 — ABNORMAL HIGH (ref 5–15)
BUN: 91 mg/dL — ABNORMAL HIGH (ref 8–23)
CO2: 15 mmol/L — ABNORMAL LOW (ref 22–32)
Calcium: 8.1 mg/dL — ABNORMAL LOW (ref 8.9–10.3)
Chloride: 103 mmol/L (ref 98–111)
Creatinine, Ser: 3.14 mg/dL — ABNORMAL HIGH (ref 0.61–1.24)
GFR, Estimated: 19 mL/min — ABNORMAL LOW (ref >60)
Glucose, Bld: 193 mg/dL — ABNORMAL HIGH (ref 70–99)
Phosphorus: 5.7 mg/dL — ABNORMAL HIGH (ref 2.5–4.6)
Potassium: 3.9 mmol/L (ref 3.5–5.1)
Sodium: 134 mmol/L — ABNORMAL LOW (ref 135–145)

## 2020-06-14 LAB — HEPARIN LEVEL (UNFRACTIONATED): Heparin Unfractionated: 0.42 IU/mL (ref 0.30–0.70)

## 2020-06-14 MED ORDER — GLYCOPYRROLATE 0.2 MG/ML IJ SOLN
0.2000 mg | INTRAMUSCULAR | Status: DC | PRN
  Administered 2020-06-14: 0.2 mg via INTRAVENOUS
  Filled 2020-06-14: qty 1

## 2020-06-14 MED ORDER — ACETAMINOPHEN 650 MG RE SUPP: 650.0000 mg | Freq: Four times a day (QID) | RECTAL | Status: DC | PRN

## 2020-06-14 MED ORDER — GLYCOPYRROLATE 0.2 MG/ML IJ SOLN: 0.2000 mg | INTRAMUSCULAR | Status: DC | PRN

## 2020-06-14 MED ORDER — DEXTROSE 5 % IV SOLN: INTRAVENOUS | Status: DC

## 2020-06-14 MED ORDER — POLYVINYL ALCOHOL 1.4 % OP SOLN: 1.0000 [drp] | Freq: Four times a day (QID) | OPHTHALMIC | Status: DC | PRN

## 2020-06-14 MED ORDER — MIDAZOLAM HCL 2 MG/2ML IJ SOLN: 1.0000 mg | INTRAMUSCULAR | Status: DC | PRN

## 2020-06-14 MED ORDER — LORAZEPAM 2 MG/ML IJ SOLN: 1.0000 mg | INTRAMUSCULAR | Status: DC | PRN

## 2020-06-14 MED ORDER — DIPHENHYDRAMINE HCL 50 MG/ML IJ SOLN: 25.0000 mg | INTRAMUSCULAR | Status: DC | PRN

## 2020-06-14 MED ORDER — ACETAMINOPHEN 325 MG PO TABS: 650.0000 mg | ORAL_TABLET | Freq: Four times a day (QID) | ORAL | Status: DC | PRN

## 2020-06-14 MED ORDER — FENTANYL BOLUS VIA INFUSION
100.0000 ug | INTRAVENOUS | Status: DC | PRN
  Filled 2020-06-14: qty 100

## 2020-06-14 MED ORDER — GLYCOPYRROLATE 1 MG PO TABS: 1.0000 mg | ORAL_TABLET | ORAL | Status: DC | PRN

## 2020-06-14 MED ORDER — FENTANYL 2500MCG IN NS 250ML (10MCG/ML) PREMIX INFUSION
0.0000 ug/h | INTRAVENOUS | Status: DC
  Administered 2020-06-14: 50 ug/h via INTRAVENOUS
  Filled 2020-06-14: qty 250

## 2020-06-14 MED ORDER — MIDAZOLAM BOLUS VIA INFUSION (WITHDRAWAL LIFE SUSTAINING TX)
2.0000 mg | INTRAVENOUS | Status: DC | PRN
  Filled 2020-06-14: qty 2

## 2020-06-14 MED ORDER — ACETAMINOPHEN 160 MG/5ML PO SOLN: 650.0000 mg | Freq: Four times a day (QID) | ORAL | Status: DC | PRN

## 2020-06-14 MED ORDER — MIDAZOLAM 50MG/50ML (1MG/ML) PREMIX INFUSION
0.0000 mg/h | INTRAVENOUS | Status: DC
  Administered 2020-06-14: 1 mg/h via INTRAVENOUS
  Administered 2020-06-14: 3 mg/h via INTRAVENOUS
  Filled 2020-06-14 (×2): qty 50

## 2020-06-14 MED ORDER — LEVETIRACETAM IN NACL 500 MG/100ML IV SOLN: 500.0000 mg | Freq: Two times a day (BID) | INTRAVENOUS | Status: DC

## 2020-06-14 MED ORDER — FENTANYL CITRATE (PF) 100 MCG/2ML IJ SOLN: 50.0000 ug | INTRAMUSCULAR | Status: DC | PRN

## 2020-06-15 LAB — PATHOLOGIST SMEAR REVIEW

## 2020-07-01 NOTE — Death Summary Note (Signed)
DEATH SUMMARY   Patient Details  Name: Samuel Gregory MRN: [id number] DOB: 1940/10/26  Admission/Discharge Information   Admit Date:  2020/06/08  Date of Death: Date of Death: June 18, 2020  Time of Death: Time of Death: 1700  Length of Stay: 10/13/2022  Referring Physician: Celene Squibb, MD   Reason(s) for Hospitalization  Patient was admitted to the hospital with complaints of abdominal pain, diarrhea.  Diagnosed with C. difficile colitis with sepsis.  Septic shock  Diagnoses  Preliminary cause of death: Clostridia difficile colitis Secondary Diagnoses (including complications and co-morbidities):  Active Problems:   Essential hypertension   Hypotension   Severe sepsis with septic shock (HCC)   Encounter for central line placement   Enteritis due to Clostridium difficile   GERD (gastroesophageal reflux disease)   Pancolitis (HCC)   Acute respiratory failure with hypoxia (HCC)   Cardiac arrest (HCC)   Elevated troponin   History of atrial flutter   Colitis   Dyspnea   Hypocalcemia   Shock liver   Encephalopathy Acute kidney injury Atrial fibrillation C. difficile colitis Chronic kidney disease Acute tubular necrosis Brief Hospital Course (including significant findings, care, treatment, and services provided and events leading to death)  Samuel Gregory is a 79 y.o. year old male who presented to the emergency department with worsening abdominal pain and diarrhea.  Had a diarrhea for about 10 days prior to coming into the hospital, was previously treated for C. difficile colitis.  Diagnosed with sepsis and while being resuscitated in the emergency department patient did have a V. fib arrest.  He was shocked twice received amiodarone achieved ROSC.  Admitted to the ICU-requiring pressors, on vent, on amiodarone.  Was being managed for septic shock secondary to C. difficile colitis.  Suffered acute on chronic renal failure. Patient started on CRRT on 06/07/2020 Patient remains critically ill  with septic shock secondary to C. difficile colitis, acute kidney injury on chronic kidney disease requiring CRRT, transaminitis secondary to his shock state.  Had suffered ventricular fibrillation arrest.  Background history of metastatic prostate cancer with bone metastasis. Patient had an episode of decreased responsiveness on 11/12 and a code stroke was called, CT head negative, felt to be related to seizures. With ongoing sepsis with added metabolic encephalopathy, goals of care discussions with family-decision was made to make patient comfort measures only as he would not desire to be kept alive artificially. Patient was compassionately withdrawn from a ventilator and succumbed to his illness on Jun 18, 2020 at 1700 hrs.   Pertinent Labs and Studies  Significant Diagnostic Studies CT ABDOMEN PELVIS W CONTRAST  Result Date: 06/08/2020 CLINICAL DATA:  Abdominal distension, diarrhea X 10 days. Told by GI that if it got worse to go to the ED. EXAM: CT ABDOMEN AND PELVIS WITH CONTRAST TECHNIQUE: Multidetector CT imaging of the abdomen and pelvis was performed using the standard protocol following bolus administration of intravenous contrast. CONTRAST:  31mL OMNIPAQUE IOHEXOL 300 MG/ML  SOLN COMPARISON:  MRI abdomen 01/21/2020, CT abdomen pelvis 01/05/2020 FINDINGS: Lower chest: No acute abnormality. Four-vessel coronary artery calcifications status post coronary artery bypass. Hepatobiliary: No focal liver abnormality. Several calcified gallstones are noted within the gallbladder lumen. Otherwise no gallbladder wall thickening or pericholecystic fluid. No biliary dilatation. Pancreas: Redemonstration of a stable 1.5 x 1.2 pancreatic head/uncinate process lesion that was consistent with a in intraductal mucinous papillary neoplasm on MRI abdomen 01/21/2020. No other pancreatic lesion identified. Normal pancreatic contour. No surrounding inflammatory changes. No main pancreatic ductal  dilatation. Spleen:  Normal in size. Associated calcified density likely represents sequelae of prior granulomatous disease. Suggestion of a splenule is again noted (2:28). Adrenals/Urinary Tract: No adrenal nodule bilaterally. Nonspecific bilateral perinephric stranding. Bilateral kidneys enhance symmetrically. No hydronephrosis. No hydroureter. The urinary bladder is unremarkable. Stomach/Bowel: Stomach is within normal limits. No evidence of small bowel wall thickening or dilatation. Extensive large bowel wall circumferential thickening and pericolonic fat stranding that is much more prominent of the rectosigmoid colon. No large bowel dilatation. No pneumatosis. Appendix appears normal. Vascular/Lymphatic: No abdominal aorta or iliac aneurysm. Focal dilatation of the infrarenal abdominal aorta with a caliber of 2.6 cm (5:55). Atherosclerotic plaque of the aorta and its branches. No abdominal, pelvic, or inguinal lymphadenopathy. Reproductive: Prostate resection. Surgical clips associated with the right testicle likely represents vasectomy. Other: Trace fluid within the abdomen and pelvis. No intraperitoneal free gas. No organized fluid collection. Musculoskeletal: Small fat and fluid containing right inguinal hernia. Possible tiny fat containing supraumbilical hernia (5:78). Diffusely decreased bone density. Posterolateral fusion of the L4 through S1 levels with inter pedicular screw and rod fixation as well as interbody cages. Multilevel severe degenerative changes of the spine with intervertebral vacuum phenomenon. No suspicious lytic or blastic osseous lesions. No acute displaced fracture. Multilevel degenerative changes of the spine. IMPRESSION: 1. Marked pancolitis that is worse of the rectosigmoid colon. Etiology could be infectious, inflammatory, ischemic. No associated bowel obstruction or perforation. Correlate with stool cultures. 2. Other imaging findings of potential clinical significance: Cholelithiasis with no acute  cholecystitis. 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 Small right inguinal hernia containing fat and fluid. Aortic Atherosclerosis (ICD10-I70.0). Electronically Signed   By: Iven Finn M.D.   On: 06/10/2020 22:57   DG CHEST PORT 1 VIEW  Result Date: 06/12/2020 CLINICAL DATA:  Encounter for central line placement. EXAM: PORTABLE CHEST 1 VIEW COMPARISON:  06/10/2020 FINDINGS: There is a left IJ catheter with tip at the cavoatrial junction. No pneumothorax identified. Feeding tube tip is below the level of the GE junction. Stable ET tube with tip above the carina. Normal heart size. Atelectasis/airspace consolidation within the left base appears partially improved from previous exam. Right lung is clear IMPRESSION: 1. Satisfactory position of left IJ catheter with tip at the cavoatrial junction. 2. Improved aeration to the left base. Electronically Signed   By: Kerby Moors M.D.   On: 06/12/2020 14:03   DG Chest Port 1 View  Result Date: 06/10/2020 CLINICAL DATA:  Respiratory failure. EXAM: PORTABLE CHEST 1 VIEW COMPARISON:  June 06, 2020. FINDINGS: Stable cardiomediastinal silhouette. Endotracheal and nasogastric tubes are unchanged in position. No pneumothorax is noted. Right basilar subsegmental atelectasis is noted. Left perihilar and basilar opacity is again noted concerning for pneumonia or atelectasis. No significant pleural effusion is noted. Bony thorax is unremarkable. IMPRESSION: Stable support apparatus. Stable left perihilar and basilar opacity is noted concerning for pneumonia or atelectasis. Right basilar subsegmental atelectasis is noted. Electronically Signed   By: Marijo Conception M.D.   On: 06/10/2020 08:07   DG Chest Port 1 View  Result Date: 06/06/2020 CLINICAL DATA:  Worsening weakness.  Fever. EXAM: PORTABLE CHEST 1 VIEW COMPARISON:  06/05/2020 FINDINGS: The right IJ catheter tip is in the SVC. ETT tip is stable above the carina. Nasogastric  tube is identified with tip in the expected location of the gastric body. Stable cardiomediastinal contours. Aortic atherosclerotic calcifications. Bilateral scratch set diffuse left lung and right upper lobe pulmonary opacities mildly increased  in the interval. No pneumothorax identified. IMPRESSION: 1. Worsening aeration to the lungs compared with previous exam. 2. Stable support apparatus. Electronically Signed   By: Kerby Moors M.D.   On: 06/06/2020 08:14   DG Chest Port 1 View  Result Date: 06/05/2020 CLINICAL DATA:  Post intubation EXAM: PORTABLE CHEST 1 VIEW COMPARISON:  06/09/2020 FINDINGS: Endotracheal tube is 4.6 cm above the carina. Patchy bilateral upper lobe airspace opacities. Heart is normal size. Prior CABG. No effusions or pneumothorax. No acute bony abnormality. IMPRESSION: Endotracheal tube 4.6 cm above the carina. Patchy bilateral upper lobe airspace opacities, new since prior study. Electronically Signed   By: Rolm Baptise M.D.   On: 06/05/2020 00:17   DG Chest Port 1 View  Result Date: 06/09/2020 CLINICAL DATA:  Code sepsis.  Fever, weakness, diarrhea EXAM: PORTABLE CHEST 1 VIEW COMPARISON:  Radiograph 02/04/2020.  CT 04/08/2016 FINDINGS: Post median sternotomy and CABG. Coronary stent also visualized. Heart is normal in size aortic atherosclerosis. Mild elevation of right hemidiaphragm. There are ill-defined streaky opacities at both lung bases. No pulmonary edema. No confluent consolidation. No pleural fluid or pneumothorax. The bones are under mineralized without acute osseous abnormalities. IMPRESSION: 1. Ill-defined streaky opacities at both lung bases, favor atelectasis over pneumonia. 2. Post CABG. Normal heart size. Aortic Atherosclerosis (ICD10-I70.0). Electronically Signed   By: Keith Rake M.D.   On: 06/26/2020 20:31   DG Chest Port 1V same Day  Result Date: 06/05/2020 CLINICAL DATA:  OG tube in central line placement EXAM: PORTABLE CHEST 1 VIEW COMPARISON:  CT  abdomen pelvis 06/24/2020, radiograph 06/05/2020 FINDINGS: Endotracheal tube tip terminates in the mid trachea, 4 cm from the carina. A transesophageal tube tip is seen coiling at the level of the GE junction possibly folded within the distal thoracic esophagus or within the gastric fundus. Could consider advancing at least 3 cm and reimaging. Right IJ catheter tip in the mid SVC. Additional support devices including pacer pads and telemetry leads overlie the chest. Prior sternotomy and CABG with vascular stenting. Cardiac silhouette is similar to prior portable supine radiography accounting for differences in lung inflation. Calcified aorta is again noted. Diffuse interstitial and patchy airspace opacities appear to be increasing within the upper lungs with some atelectatic changes elsewhere. No pneumothorax or visible effusion. No acute osseous or soft tissue abnormality. IMPRESSION: 1. Endotracheal tube tip terminates in the mid trachea, 4 cm from the carina. 2. A transesophageal tube tip is seen coiling at the level of the GE junction possibly folded within the distal thoracic esophagus or within the gastric fundus. Could consider advancing at least 3 cm and reimaging. 3. Increasing upper lobe opacities with some likely atelectatic changes in the lung bases. 4.  Aortic Atherosclerosis (ICD10-I70.0). Electronically Signed   By: Lovena Le M.D.   On: 06/05/2020 01:37   EEG adult  Result Date: 06/12/2020 Lora Havens, MD     06/12/2020  9:08 PM Patient Name: Samuel Gregory MRN: [id number] Epilepsy Attending: Lora Havens Referring Physician/Provider: Dr Lynnae Sandhoff Date: 06/12/2020 Duration: 34.50 mins Patient history: 79yo M with sudden onset of unresponsiveness and seizure like activity. EEG to evaluate for seizure Level of alertness:  comatose AEDs during EEG study: Ativan, LEV Technical aspects: This EEG study was done with scalp electrodes positioned according to the 10-20 International system  of electrode placement. Electrical activity was acquired at a sampling rate of 500Hz  and reviewed with a high frequency filter of 70Hz  and a low frequency  filter of 1Hz . EEG data were recorded continuously and digitally stored. Description: EEG showed continuous generalized low amplitude rhythmic 2-3Hz  delta slowing with intermittent 9-10 hz generalized alpha activity.  Hyperventilation and photic stimulation were not performed.   ABNORMALITY -Continuous delta slow, generalized IMPRESSION: This study is suggestive of severe diffuse encephalopathy, nonspecific etiology but likely related to sedation, post-ictal state. No seizures or epileptiform discharges were seen throughout the recording. Lora Havens   ECHOCARDIOGRAM COMPLETE  Result Date: 06/05/2020    ECHOCARDIOGRAM REPORT   Patient Name:   Samuel Gregory Date of Exam: 06/05/2020 Medical Rec #:  [id number]    Height:       66.0 in Accession #:    [id number]   Weight:       169.3 lb Date of Birth:  Jan 05, 1941    BSA:          1.863 m Patient Age:    41 years     BP:           80/56 mmHg Patient Gender: M            HR:           119 bpm. Exam Location:  [facility] Procedure: 2D Echo Indications:    Cardiac arrest I46.9  History:        Patient has prior history of Echocardiogram examinations, most                 recent 01/23/2020. CAD, Arrythmias:Atrial Flutter; Risk                 Factors:Hypertension and Dyslipidemia. Prostate Cancer, Vfib,                 Septic Shock.  Sonographer:    BW Referring Phys: [id number] [place]  1. Left ventricular ejection fraction, by estimation, is 40%. The left ventricle has normal function. The left ventricle demonstrates global hypokinesis. Left ventricular diastolic parameters are indeterminate.  2. Right ventricular systolic function was not well visualized. The right ventricular size is not well visualized.  3. The mitral valve is normal in structure. No evidence of mitral valve regurgitation. No  evidence of mitral stenosis.  4. The aortic valve is tricuspid. Aortic valve regurgitation is not visualized. No aortic stenosis is present.  5. The inferior vena cava is normal in size with greater than 50% respiratory variability, suggesting right atrial pressure of 3 mmHg. FINDINGS  Left Ventricle: Left ventricular ejection fraction, by estimation, is 40%. The left ventricle has normal function. The left ventricle demonstrates global hypokinesis. The left ventricular internal cavity size was normal in size. There is no left ventricular hypertrophy. Left ventricular diastolic parameters are indeterminate. Right Ventricle: RV poorly visualized. Grossly appears normal in size and function. The right ventricular size is not well visualized. Right vetricular wall thickness was not well visualized. Right ventricular systolic function was not well visualized. Left Atrium: Left atrial size was normal in size. Right Atrium: Right atrial size was not well visualized. Pericardium: There is no evidence of pericardial effusion. Mitral Valve: The mitral valve is normal in structure. No evidence of mitral valve regurgitation. No evidence of mitral valve stenosis. Tricuspid Valve: The tricuspid valve is not well visualized. Tricuspid valve regurgitation is mild . No evidence of tricuspid stenosis. Aortic Valve: The aortic valve is tricuspid. Aortic valve regurgitation is not visualized. No aortic stenosis is present. Aortic valve mean gradient measures 2.0 mmHg. Aortic valve peak gradient measures  3.6 mmHg. Aortic valve area, by VTI measures 3.03 cm. Pulmonic Valve: The pulmonic valve was not well visualized. Pulmonic valve regurgitation is not visualized. No evidence of pulmonic stenosis. Aorta: The aortic root is normal in size and structure. Pulmonary Artery: 21. Venous: The inferior vena cava is normal in size with greater than 50% respiratory variability, suggesting right atrial pressure of 3 mmHg. IAS/Shunts: No atrial  level shunt detected by color flow Doppler.  LEFT VENTRICLE PLAX 2D LVIDd:         3.98 cm  Diastology LVIDs:         3.06 cm  LV e' medial:  8.92 cm/s LV PW:         1.20 cm  LV e' lateral: 8.16 cm/s LV IVS:        1.13 cm LVOT diam:     2.10 cm LV SV:         33 LV SV Index:   18 LVOT Area:     3.46 cm  RIGHT VENTRICLE RV S prime:     8.70 cm/s LEFT ATRIUM             Index       RIGHT ATRIUM           Index LA diam:        3.10 cm 1.66 cm/m  RA Area:     13.50 cm LA Vol (A2C):   37.3 ml 20.02 ml/m RA Volume:   34.20 ml  18.36 ml/m LA Vol (A4C):   24.6 ml 13.20 ml/m LA Biplane Vol: 30.3 ml 16.26 ml/m  AORTIC VALVE AV Area (Vmax):    3.15 cm AV Area (Vmean):   3.03 cm AV Area (VTI):     3.03 cm AV Vmax:           95.02 cm/s AV Vmean:          66.053 cm/s AV VTI:            0.110 m AV Peak Grad:      3.6 mmHg AV Mean Grad:      2.0 mmHg LVOT Vmax:         86.55 cm/s LVOT Vmean:        57.802 cm/s LVOT VTI:          0.096 m LVOT/AV VTI ratio: 0.88  AORTA Ao Root diam: 3.00 cm MITRAL VALVE               TRICUSPID VALVE MV Area (PHT): 7.59 cm    TR Peak grad:   21.0 mmHg MV Decel Time: 100 msec    TR Vmax:        229.00 cm/s MV A velocity: 59.10 cm/s                            SHUNTS                            Systemic VTI:  0.10 m                            Systemic Diam: 2.10 cm Carlyle Dolly MD Electronically signed by Carlyle Dolly MD Signature Date/Time: 06/05/2020/4:31:35 PM    Final    CT HEAD CODE STROKE WO CONTRAST  Result Date: 06/12/2020 CLINICAL DATA:  Code stroke.  Sudden onset of unresponsiveness.  EXAM: CT HEAD WITHOUT CONTRAST TECHNIQUE: Contiguous axial images were obtained from the base of the skull through the vertex without intravenous contrast. COMPARISON:  None. FINDINGS: Brain: There is no evidence of an acute infarct, intracranial hemorrhage, mass, midline shift, or extra-axial fluid collection. The ventricles and sulci are within normal limits for age. Hypodensities in the  cerebral white matter bilaterally are nonspecific but compatible with mild chronic small vessel ischemic disease. Vascular: Calcified atherosclerosis at the skull base. No asymmetric vessel hyperdensity. Skull: No fracture or suspicious osseous lesion. Sinuses/Orbits: Small volume fluid in the sphenoid sinuses. Small left maxillary sinus mucous retention cyst. Large bilateral mastoid and middle ear effusions in the setting of intubation. Bilateral cataract extraction. Other: None. ASPECTS [facility] Stroke Program Early CT Score) - Ganglionic level infarction (caudate, lentiform nuclei, internal capsule, insula, M1-M3 cortex): 7 - Supraganglionic infarction (M4-M6 cortex): 3 Total score (0-10 with 10 being normal): 10 IMPRESSION: 1. No evidence of acute intracranial abnormality. 2. ASPECTS is 10. 3. Mild chronic small vessel ischemic disease. These results were communicated to Dr. Theda Sers at 6:22 pm on 06/12/2020 by text page via the [facility] messaging system. Electronically Signed   By: Logan Bores M.D.   On: 06/12/2020 18:23   CT ANGIO HEAD CODE STROKE  Result Date: 06/12/2020 CLINICAL DATA:  Sudden onset of unresponsiveness. EXAM: CT ANGIOGRAPHY HEAD AND NECK TECHNIQUE: Multidetector CT imaging of the head and neck was performed using the standard protocol during bolus administration of intravenous contrast. Multiplanar CT image reconstructions and MIPs were obtained to evaluate the vascular anatomy. Carotid stenosis measurements (when applicable) are obtained utilizing NASCET criteria, using the distal internal carotid diameter as the denominator. CONTRAST:  25mL OMNIPAQUE IOHEXOL 350 MG/ML SOLN COMPARISON:  None. FINDINGS: CTA NECK FINDINGS Aortic arch: Standard 3 vessel aortic arch with mild atherosclerotic plaque. Widely patent arch vessel origins. Right carotid system: Patent with mild calcified plaque at the carotid bifurcation. No evidence of significant stenosis or dissection. Left carotid system: Patent  with mild calcified plaque at the carotid bifurcation. No evidence of significant stenosis or dissection. Vertebral arteries: Patent and codominant without evidence of flow limiting stenosis or dissection. Calcified plaque at the vertebral artery origins resulting in at most mild stenosis. Skeleton: Moderate lower cervical disc degeneration. Focally advanced right facet arthrosis at C3-4 with grade 1 anterolisthesis. Other neck: No evidence of cervical lymphadenopathy or mass. Upper chest: Endotracheal tube terminating above the carina. Partially visualized secretions in the distal trachea and proximal mainstem bronchi. Partially visualized left lower lobe collapse and small left pleural effusion. Partially visualized enteric tube in the esophagus. Review of the MIP images confirms the above findings CTA HEAD FINDINGS Anterior circulation: The internal carotid arteries are patent from skull base to carotid termini with calcified plaque resulting in mild paraclinoid stenosis on the right. ACAs and MCAs are patent without evidence of a proximal branch occlusion or significant proximal stenosis. No aneurysm is identified. Posterior circulation: The intracranial vertebral arteries are patent to the basilar with mild stenosis bilaterally due to atherosclerotic plaque. Patent PICA and SCA origins are seen bilaterally. The basilar artery is widely patent with an incidental fenestration noted proximally. Posterior communicating arteries are diminutive or absent. Both PCAs are patent without evidence of a significant proximal stenosis. No aneurysm is identified. Venous sinuses: Not evaluated due to arterial contrast timing. Anatomic variants: None. Review of the MIP images confirms the above findings IMPRESSION: 1. Mild atherosclerosis in the head and neck without large vessel occlusion or  flow limiting stenosis. 2. Partially visualized left lower lobe collapse and small left pleural effusion. 3.  Aortic Atherosclerosis  (ICD10-I70.0). These results were communicated to Dr. Theda Sers at 7:01 pm on 06/12/2020 by text page via the [facility] messaging system. Electronically Signed   By: Logan Bores M.D.   On: 06/12/2020 19:11   CT ANGIO NECK CODE STROKE  Result Date: 06/12/2020 CLINICAL DATA:  Sudden onset of unresponsiveness. EXAM: CT ANGIOGRAPHY HEAD AND NECK TECHNIQUE: Multidetector CT imaging of the head and neck was performed using the standard protocol during bolus administration of intravenous contrast. Multiplanar CT image reconstructions and MIPs were obtained to evaluate the vascular anatomy. Carotid stenosis measurements (when applicable) are obtained utilizing NASCET criteria, using the distal internal carotid diameter as the denominator. CONTRAST:  48mL OMNIPAQUE IOHEXOL 350 MG/ML SOLN COMPARISON:  None. FINDINGS: CTA NECK FINDINGS Aortic arch: Standard 3 vessel aortic arch with mild atherosclerotic plaque. Widely patent arch vessel origins. Right carotid system: Patent with mild calcified plaque at the carotid bifurcation. No evidence of significant stenosis or dissection. Left carotid system: Patent with mild calcified plaque at the carotid bifurcation. No evidence of significant stenosis or dissection. Vertebral arteries: Patent and codominant without evidence of flow limiting stenosis or dissection. Calcified plaque at the vertebral artery origins resulting in at most mild stenosis. Skeleton: Moderate lower cervical disc degeneration. Focally advanced right facet arthrosis at C3-4 with grade 1 anterolisthesis. Other neck: No evidence of cervical lymphadenopathy or mass. Upper chest: Endotracheal tube terminating above the carina. Partially visualized secretions in the distal trachea and proximal mainstem bronchi. Partially visualized left lower lobe collapse and small left pleural effusion. Partially visualized enteric tube in the esophagus. Review of the MIP images confirms the above findings CTA HEAD FINDINGS  Anterior circulation: The internal carotid arteries are patent from skull base to carotid termini with calcified plaque resulting in mild paraclinoid stenosis on the right. ACAs and MCAs are patent without evidence of a proximal branch occlusion or significant proximal stenosis. No aneurysm is identified. Posterior circulation: The intracranial vertebral arteries are patent to the basilar with mild stenosis bilaterally due to atherosclerotic plaque. Patent PICA and SCA origins are seen bilaterally. The basilar artery is widely patent with an incidental fenestration noted proximally. Posterior communicating arteries are diminutive or absent. Both PCAs are patent without evidence of a significant proximal stenosis. No aneurysm is identified. Venous sinuses: Not evaluated due to arterial contrast timing. Anatomic variants: None. Review of the MIP images confirms the above findings IMPRESSION: 1. Mild atherosclerosis in the head and neck without large vessel occlusion or flow limiting stenosis. 2. Partially visualized left lower lobe collapse and small left pleural effusion. 3.  Aortic Atherosclerosis (ICD10-I70.0). These results were communicated to Dr. Theda Sers at 7:01 pm on 06/12/2020 by text page via the [facility] messaging system. Electronically Signed   By: Logan Bores M.D.   On: 06/12/2020 19:11    Microbiology No results found for this or any previous visit (from the past 240 hour(s)).  Lab Basic Metabolic Panel: Recent Labs  Lab 06/12/20 0423 06/12/20 1557 06/13/20 0316 06/13/20 1850 07/08/2020 0359  NA 133* 135 135 134* 134*  K 4.5 4.5 3.7 3.7 3.9  CL 100 101 104 102 103  CO2 18* 21* 18* 14* 15*  GLUCOSE 206* 173* 201* 193* 193*  BUN 42* 45* 45* 69* 91*  CREATININE 1.66* 1.74* 1.66* 2.46* 3.14*  CALCIUM 8.0* 8.0* 7.9* 8.0* 8.1*  PHOS 3.0 3.6 2.9 5.2* 5.7*  Liver Function Tests: Recent Labs  Lab 06/10/20 0430 06/10/20 1535 06/11/20 0416 06/11/20 1600 06/12/20 0423 06/12/20 1557  06/13/20 0316 06/13/20 1850 Jul 03, 2020 0359  AST 1,491*  --  351*  --   --   --   --   --   --   ALT 1,225*  --  708*  --   --   --   --   --   --   ALKPHOS 467*  --  494*  --   --   --   --   --   --   BILITOT 1.2  --  1.3*  --   --   --   --   --   --   PROT 5.3*  --  5.6*  --   --   --   --   --   --   ALBUMIN 2.0*  2.0*   < > 2.8*  2.8*   < > 2.7* 2.7* 2.4* 2.4* 2.2*   < > = values in this interval not displayed.   No results for input(s): LIPASE, AMYLASE in the last 168 hours. No results for input(s): AMMONIA in the last 168 hours. CBC: Recent Labs  Lab 06/10/20 0430 06/11/20 0416 06/12/20 0423 06/13/20 0316 07-03-2020 0359  WBC 20.5* 29.6* 29.3* 24.2* 21.8*  HGB 11.6* 11.2* 11.3* 10.8* 10.5*  HCT 35.6* 34.2* 34.3* 32.6* 31.6*  MCV 94.7 94.7 95.0 92.4 93.8  PLT 245 264 280 244 249   Cardiac Enzymes: No results for input(s): CKTOTAL, CKMB, CKMBINDEX, TROPONINI in the last 168 hours. Sepsis Labs: Recent Labs  Lab 06/11/20 0416 06/12/20 0423 06/13/20 0316 03-Jul-2020 0359  WBC 29.6* 29.3* 24.2* 21.8*    Procedures/Operations  Endotracheal intubation Central venous access placement Arterial line placement  Bartosz Luginbill A Hipolito Martinezlopez 06/16/2020, 3:56 PM

## 2020-07-01 NOTE — Progress Notes (Signed)
Comfort measures orders placed

## 2020-07-01 NOTE — Progress Notes (Signed)
[facility]PHROLOG[facility] PROGRESS NOTE  Assessment/ Plan: Pt is a 79 y.o. yo male with CAD, atrial flutter, CKD, HTN, HLD, prostate cancer who present w/ C. difficile diarrhea, shock, cardiac arrest.  #Oliguric AKI on CKD stage III due to ATN after cardiac arrest/shock: Did not respond with IV diuretics and hemodynamic support.  Started CRRT on 11/7-11/13.  Tolerated ultrafiltration with CRRT.  The volume was much better and he became hypotensive therefore CRRT was held yesterday.  Today the CVP is around 7 and BP has improved.  I will continue to hold CRRT today and reassess tomorrow morning.  He is anuric however no other indication for dialysis today.  Discussed with ICU team. CT scan without hydronephrosis. Monitor urine output and labs.  #Acute hypoxic, hypercapnic respiratory failure requiring mechanical ventilation: PCCM is following.  #VF arrest/acute systolic heart failure: Cardiology is following.  Considering left heart cath when he is a stable.  #Septic shock due to C. difficile colitis: Antibiotics per primary team.  On pressors Levophed and off of vasopressin.  Maintaining MAP more than 65.  #History of metastatic prostate cancer with bone metastasis and stable pancreatic mass.  # Non-anion gap metabolic acidosis associated with diarrhea.  Bicarb was as low as 12.    Improved now.  #Hyponatremia/hypochloremia: Likely related to free water retention in setting of AKI as well as isotonic fluid loss from diarrhea.  Now managed with CRRT.  # Hypocalcemia:   low albumin level.  Replete calcium.  #Hypophosphatemia: Repleted phosphorus during CRRT.  Now the phosphorus level has gone up with AKI, monitor lab.  May need  binders  #Shock liver.  Trend liver enzymes.  Subjective: Seen and examined ICU.  Blood pressure much better and CVP around 7.  Volume status acceptable.  No other event. Objective Vital signs in last 24 hours: Vitals:   July 02, 2020 0630 2020-07-02 0645  2020/07/02 0700 07-02-2020 0728  BP:      Pulse: 91 94 93 94  Resp: (!) 23 (!) 28 (!) 28 (!) 25  Temp: 99.1 F (37.3 C) 99.1 F (37.3 C) 99.3 F (37.4 C)   TempSrc:      SpO2: 100% 99% 98% 99%  Weight:      Height:       Weight change: -5.5 kg  Intake/Output Summary (Last 24 hours) at 07/02/20 0841 Last data filed at 2020/07/02 0700 Gross per 24 hour  Intake 2464.6 ml  Output 607 ml  Net 1857.6 ml       Labs: Basic Metabolic Panel: Recent Labs  Lab 06/13/20 0316 06/13/20 1850 02-Jul-2020 0359  NA 135 134* 134*  K 3.7 3.7 3.9  CL 104 102 103  CO2 18* 14* 15*  GLUCOSE 201* 193* 193*  BUN 45* 69* 91*  CREATININE 1.66* 2.46* 3.14*  CALCIUM 7.9* 8.0* 8.1*  PHOS 2.9 5.2* 5.7*   Liver Function Tests: Recent Labs  Lab 06/09/20 0447 06/09/20 1511 06/10/20 0430 06/10/20 1535 06/11/20 0416 06/11/20 1600 06/13/20 0316 06/13/20 1850 07-02-20 0359  AST 4,444*  --  1,491*  --  351*  --   --   --   --   ALT 1,487*  --  1,225*  --  708*  --   --   --   --   ALKPHOS 270*  --  467*  --  494*  --   --   --   --   BILITOT 1.3*  --  1.2  --  1.3*  --   --   --   --  PROT 4.7*  --  5.3*  --  5.6*  --   --   --   --   ALBUMIN 1.8*  1.8*   < > 2.0*  2.0*   < > 2.8*  2.8*   < > 2.4* 2.4* 2.2*   < > = values in this interval not displayed.   No results for input(s): LIPASE, AMYLASE in the last 168 hours. No results for input(s): AMMONIA in the last 168 hours. CBC: Recent Labs  Lab 06/10/20 0430 06/10/20 0430 06/11/20 0416 06/11/20 0416 06/12/20 0423 06/13/20 0316 06-28-20 0359  WBC 20.5*   < > 29.6*   < > 29.3* 24.2* 21.8*  HGB 11.6*   < > 11.2*   < > 11.3* 10.8* 10.5*  HCT 35.6*   < > 34.2*   < > 34.3* 32.6* 31.6*  MCV 94.7  --  94.7  --  95.0 92.4 93.8  PLT 245   < > 264   < > 280 244 249   < > = values in this interval not displayed.   Cardiac Enzymes: No results for input(s): CKTOTAL, CKMB, CKMBINDEX, TROPONINI in the last 168 hours. CBG: Recent Labs  Lab  06/13/20 1206 06/13/20 1702 06/13/20 1957 06/13/20 2331 2020-06-28 0349  GLUCAP 169* 165* 173* 183* 193*    Iron Studies: No results for input(s): IRON, TIBC, TRANSFERRIN, FERRITIN in the last 72 hours. Studies/Results: DG CHEST PORT 1 VIEW  Result Date: 06/12/2020 CLINICAL DATA:  Encounter for central line placement. EXAM: PORTABLE CHEST 1 VIEW COMPARISON:  06/10/2020 FINDINGS: There is a left IJ catheter with tip at the cavoatrial junction. No pneumothorax identified. Feeding tube tip is below the level of the GE junction. Stable ET tube with tip above the carina. Normal heart size. Atelectasis/airspace consolidation within the left base appears partially improved from previous exam. Right lung is clear IMPRESSION: 1. Satisfactory position of left IJ catheter with tip at the cavoatrial junction. 2. Improved aeration to the left base. Electronically Signed   By: Kerby Moors M.D.   On: 06/12/2020 14:03   EEG adult  Result Date: 06/12/2020 Lora Havens, MD     06/12/2020  9:08 PM Patient Name: Samuel Gregory MRN: [id number] Epilepsy Attending: Lora Havens Referring Physician/Provider: Dr Lynnae Sandhoff Date: 06/12/2020 Duration: 34.50 mins Patient history: 79yo M with sudden onset of unresponsiveness and seizure like activity. EEG to evaluate for seizure Level of alertness:  comatose AEDs during EEG study: Ativan, LEV Technical aspects: This EEG study was done with scalp electrodes positioned according to the 10-20 International system of electrode placement. Electrical activity was acquired at a sampling rate of _0  and reviewed with a high frequency filter of _1  and a low frequency filter of _2 . EEG data were recorded continuously and digitally stored. Description: EEG showed continuous generalized low amplitude rhythmic 2-_3  delta slowing with intermittent 9-10 hz generalized alpha activity.  Hyperventilation and photic stimulation were not performed.   ABNORMALITY -Continuous  delta slow, generalized IMPRESSION: This study is suggestive of severe diffuse encephalopathy, nonspecific etiology but likely related to sedation, post-ictal state. No seizures or epileptiform discharges were seen throughout the recording. Lora Havens   CT HEAD CODE STROKE WO CONTRAST  Result Date: 06/12/2020 CLINICAL DATA:  Code stroke.  Sudden onset of unresponsiveness. EXAM: CT HEAD WITHOUT CONTRAST TECHNIQUE: Contiguous axial images were obtained from the base of the skull through the vertex without intravenous contrast. COMPARISON:  None. FINDINGS: Brain: There is  no evidence of an acute infarct, intracranial hemorrhage, mass, midline shift, or extra-axial fluid collection. The ventricles and sulci are within normal limits for age. Hypodensities in the cerebral white matter bilaterally are nonspecific but compatible with mild chronic small vessel ischemic disease. Vascular: Calcified atherosclerosis at the skull base. No asymmetric vessel hyperdensity. Skull: No fracture or suspicious osseous lesion. Sinuses/Orbits: Small volume fluid in the sphenoid sinuses. Small left maxillary sinus mucous retention cyst. Large bilateral mastoid and middle ear effusions in the setting of intubation. Bilateral cataract extraction. Other: None. ASPECTS [facility] Stroke Program Early CT Score) - Ganglionic level infarction (caudate, lentiform nuclei, internal capsule, insula, M1-M3 cortex): 7 - Supraganglionic infarction (M4-M6 cortex): 3 Total score (0-10 with 10 being normal): 10 IMPRESSION: 1. No evidence of acute intracranial abnormality. 2. ASPECTS is 10. 3. Mild chronic small vessel ischemic disease. These results were communicated to Dr. Theda Sers at 6:22 pm on 06/12/2020 by text page via the [facility] messaging system. Electronically Signed   By: Logan Bores M.D.   On: 06/12/2020 18:23   CT ANGIO HEAD CODE STROKE  Result Date: 06/12/2020 CLINICAL DATA:  Sudden onset of unresponsiveness. EXAM: CT ANGIOGRAPHY  HEAD AND NECK TECHNIQUE: Multidetector CT imaging of the head and neck was performed using the standard protocol during bolus administration of intravenous contrast. Multiplanar CT image reconstructions and MIPs were obtained to evaluate the vascular anatomy. Carotid stenosis measurements (when applicable) are obtained utilizing NASCET criteria, using the distal internal carotid diameter as the denominator. CONTRAST:  54m OMNIPAQUE IOHEXOL 350 MG/ML SOLN COMPARISON:  None. FINDINGS: CTA NECK FINDINGS Aortic arch: Standard 3 vessel aortic arch with mild atherosclerotic plaque. Widely patent arch vessel origins. Right carotid system: Patent with mild calcified plaque at the carotid bifurcation. No evidence of significant stenosis or dissection. Left carotid system: Patent with mild calcified plaque at the carotid bifurcation. No evidence of significant stenosis or dissection. Vertebral arteries: Patent and codominant without evidence of flow limiting stenosis or dissection. Calcified plaque at the vertebral artery origins resulting in at most mild stenosis. Skeleton: Moderate lower cervical disc degeneration. Focally advanced right facet arthrosis at C3-4 with grade 1 anterolisthesis. Other neck: No evidence of cervical lymphadenopathy or mass. Upper chest: Endotracheal tube terminating above the carina. Partially visualized secretions in the distal trachea and proximal mainstem bronchi. Partially visualized left lower lobe collapse and small left pleural effusion. Partially visualized enteric tube in the esophagus. Review of the MIP images confirms the above findings CTA HEAD FINDINGS Anterior circulation: The internal carotid arteries are patent from skull base to carotid termini with calcified plaque resulting in mild paraclinoid stenosis on the right. ACAs and MCAs are patent without evidence of a proximal branch occlusion or significant proximal stenosis. No aneurysm is identified. Posterior circulation: The  intracranial vertebral arteries are patent to the basilar with mild stenosis bilaterally due to atherosclerotic plaque. Patent PICA and SCA origins are seen bilaterally. The basilar artery is widely patent with an incidental fenestration noted proximally. Posterior communicating arteries are diminutive or absent. Both PCAs are patent without evidence of a significant proximal stenosis. No aneurysm is identified. Venous sinuses: Not evaluated due to arterial contrast timing. Anatomic variants: None. Review of the MIP images confirms the above findings IMPRESSION: 1. Mild atherosclerosis in the head and neck without large vessel occlusion or flow limiting stenosis. 2. Partially visualized left lower lobe collapse and small left pleural effusion. 3.  Aortic Atherosclerosis (ICD10-I70.0). These results were communicated to Dr. CTheda Sersat 7:01 pm  on 06/12/2020 by text page via the [facility] messaging system. Electronically Signed   By: Logan Bores M.D.   On: 06/12/2020 19:11   CT ANGIO NECK CODE STROKE  Result Date: 06/12/2020 CLINICAL DATA:  Sudden onset of unresponsiveness. EXAM: CT ANGIOGRAPHY HEAD AND NECK TECHNIQUE: Multidetector CT imaging of the head and neck was performed using the standard protocol during bolus administration of intravenous contrast. Multiplanar CT image reconstructions and MIPs were obtained to evaluate the vascular anatomy. Carotid stenosis measurements (when applicable) are obtained utilizing NASCET criteria, using the distal internal carotid diameter as the denominator. CONTRAST:  41m OMNIPAQUE IOHEXOL 350 MG/ML SOLN COMPARISON:  None. FINDINGS: CTA NECK FINDINGS Aortic arch: Standard 3 vessel aortic arch with mild atherosclerotic plaque. Widely patent arch vessel origins. Right carotid system: Patent with mild calcified plaque at the carotid bifurcation. No evidence of significant stenosis or dissection. Left carotid system: Patent with mild calcified plaque at the carotid bifurcation.  No evidence of significant stenosis or dissection. Vertebral arteries: Patent and codominant without evidence of flow limiting stenosis or dissection. Calcified plaque at the vertebral artery origins resulting in at most mild stenosis. Skeleton: Moderate lower cervical disc degeneration. Focally advanced right facet arthrosis at C3-4 with grade 1 anterolisthesis. Other neck: No evidence of cervical lymphadenopathy or mass. Upper chest: Endotracheal tube terminating above the carina. Partially visualized secretions in the distal trachea and proximal mainstem bronchi. Partially visualized left lower lobe collapse and small left pleural effusion. Partially visualized enteric tube in the esophagus. Review of the MIP images confirms the above findings CTA HEAD FINDINGS Anterior circulation: The internal carotid arteries are patent from skull base to carotid termini with calcified plaque resulting in mild paraclinoid stenosis on the right. ACAs and MCAs are patent without evidence of a proximal branch occlusion or significant proximal stenosis. No aneurysm is identified. Posterior circulation: The intracranial vertebral arteries are patent to the basilar with mild stenosis bilaterally due to atherosclerotic plaque. Patent PICA and SCA origins are seen bilaterally. The basilar artery is widely patent with an incidental fenestration noted proximally. Posterior communicating arteries are diminutive or absent. Both PCAs are patent without evidence of a significant proximal stenosis. No aneurysm is identified. Venous sinuses: Not evaluated due to arterial contrast timing. Anatomic variants: None. Review of the MIP images confirms the above findings IMPRESSION: 1. Mild atherosclerosis in the head and neck without large vessel occlusion or flow limiting stenosis. 2. Partially visualized left lower lobe collapse and small left pleural effusion. 3.  Aortic Atherosclerosis (ICD10-I70.0). These results were communicated to Dr.  CTheda Sersat 7:01 pm on 06/12/2020 by text page via the [facility]. Electronically Signed   By: ALogan BoresM.D.   On: 06/12/2020 19:11    Medications: Infusions: . sodium chloride 5 mL/hr at 112/01/210700  . amiodarone 30 mg/hr (112-01-20210700)  . dexmedetomidine (PRECEDEX) IV infusion Stopped (06/12/20 1752)  . dextrose Stopped (06/07/20 2017)  . famotidine (PEPCID) IV 20 mg (1December 01, 20210815)  . feeding supplement (VITAL AF 1.2 CAL) 1,000 mL (06/13/20 2050)  . heparin 650 Units/hr (112-01-20210802)  . levETIRAcetam Stopped (06/13/20 2157)  . norepinephrine (LEVOPHED) Adult infusion 2 mcg/min (12021-12-010811)  . vasopressin Stopped (06/12/20 0950)    Scheduled Medications: . aspirin  162 mg Per Tube Daily  . B-complex with vitamin C  1 tablet Per Tube Daily  . chlorhexidine gluconate (MEDLINE KIT)  15 mL Mouth Rinse BID  . Chlorhexidine Gluconate Cloth  6 each Topical Daily  .  feeding supplement (PROSource TF)  45 mL Per Tube TID  . hydrocortisone sod succinate (SOLU-CORTEF) inj  50 mg Intravenous Q6H  . hydroxypropyl methylcellulose / hypromellose  1 drop Both Eyes TID  . insulin aspart  1-3 Units Subcutaneous Q4H  . insulin aspart  3 Units Subcutaneous Q4H  . lactobacillus acidophilus  2 tablet Per Tube TID  . mouth rinse  15 mL Mouth Rinse 10 times per day  . nystatin  5 mL Oral QID  . sodium chloride flush  10-40 mL Intracatheter Q12H  . vancomycin  125 mg Per Tube QID   Followed by  . [START ON 06/25/2020] vancomycin  125 mg Per Tube BID   Followed by  . [START ON 07/03/2020] vancomycin  125 mg Per Tube Daily   Followed by  . [START ON 07/10/2020] vancomycin  125 mg Per Tube QODAY   Followed by  . [START ON 07/18/2020] vancomycin  125 mg Per Tube Q3 days    have reviewed scheduled and prn medications.  Physical Exam: General: Intubated, sedated, unresponsive Heart:RRR, s1s2 nl Lungs: Coarse breath sound bilateral, Abdomen: Soft, nondistended Extremities:   lower extremity edema is much better Dialysis Access: Groin catheter.  Dorinda Stehr Prasad Kymani Shimabukuro 06-28-20,8:41 AM  LOS: 10 days  Pager: [phone]

## 2020-07-01 NOTE — Procedures (Signed)
Extubation Procedure Note  Patient Details:   Name: Samuel Gregory DOB: [birth year] MRN: [id number]   Airway Documentation:    Vent end date: 07-01-2020 Vent end time: 1457   Evaluation  O2 sats: stable throughout Complications: No apparent complications Patient did tolerate procedure well. Bilateral Breath Sounds: Clear, Diminished   No, pt could not speak post extubation.  Pt extubated to room air per physician's order and in accordance with family's wishes.  Family present at bedside during extubation.  Earney Navy 2020/07/01, 2:58 PM

## 2020-07-01 NOTE — Consult Note (Signed)
[place] for Heparin Indication: atrial fibrillation  Allergies  Allergen Reactions  . Latex Rash  . Sulfa Antibiotics Rash    Patient Measurements: Height: 5\' 6"  (167.6 cm) Weight: 63.5 kg (139 lb 15.9 oz) IBW/kg (Calculated) : 63.8 Heparin Dosing Weight: 82.7 kg  Vital Signs: Temp: 98.8 F (37.1 C) (11/14 1000) Temp Source: Oral (11/14 0800) BP: 143/64 (11/14 0400) Pulse Rate: 87 (11/14 1000)  Labs: Recent Labs    06/12/20 0423 06/12/20 0423 06/12/20 1557 06/12/20 2304 06/13/20 0316 06/13/20 0826 06/13/20 1850 Jun 20, 2020 0359  HGB 11.3*   < >  --   --  10.8*  --   --  10.5*  HCT 34.3*  --   --   --  32.6*  --   --  31.6*  PLT 280  --   --   --  244  --   --  249  HEPARINUNFRC 0.10*  --   --  0.25*  --  0.37  --  0.42  CREATININE 1.66*   < >   < >  --  1.66*  --  2.46* 3.14*   < > = values in this interval not displayed.    Estimated Creatinine Clearance: 17.1 mL/min (A) (by C-G formula based on SCr of 3.14 mg/dL (H)).  Assessment: 79 y.o. male with h/o Afib on apixaban prior to admit. Patient transitioned over to heparin.   Heparin level at goal this morning (0.37) on gtt at 650 units/hr. No issues with line or bleeding reported per RN.   Goal of Therapy:  Heparin level 0.3-0.7 units/mL Monitor platelets by anticoagulation protocol: Yes   Plan:  Continue Heparin at 650 units/hr Daily heparin level and CBC  Erin Hearing PharmD., BCPS Clinical Pharmacist 06/20/2020 12:36 PM

## 2020-07-01 NOTE — Progress Notes (Signed)
NAME:  Samuel Gregory, MRN:  [id number], DOB:  02/02/1941, LOS: 58 ADMISSION DATE:  06/07/2020, CONSULTATION DATE:  11/5 REFERRING MD:  [facility], CHIEF COMPLAINT:  Shock/ resp failure from [facility]  Brief History   80 yowm never smoker with h/o prostate ca and prior c diff infection admitted 11/4 with voluminous diarrhea and abd pain and coded in ER   VF arrest > admit to ICU with pancolitis on admit CT / vent dep/ pressor dep and PCCM consulted am 11/5   History of present illness   79 y.o. male with past medical history of atrial fibrillation on Eliquis, coronary artery disease status post CABG in 2000, hypertension, hyperlipidemia, GERD, HLD,metastatic prostate cancer and history of C. difficile infection x1, who presented for evaluation of acute onset of diarrhea and arrested as w/u in progress.   Per his daugher he was ambulatory prior to acute illness   Past Medical History  Arthritis  A-fib CAD S.P CABG 2000 HTN HLD Prostate cancer    Significant Hospital Events   VF arrest pm 11/4 > amiodarone drip 11/5 shock due to pancolitis  Consults:  PCCM 11/5  Urology 11/5 Cards   11/5   Procedures:  ET  11/5 > R IJ  11/5 > 11/6 R Rad Art line 11/5 > 11/7 L fem art line 11/7 >  L fem HD cath 11/7 >   Significant Diagnostic Tests:  CT abd 11/4 1 > Marked pancolitis that is worse of the rectosigmoid colon. Etiology could be infectious, inflammatory, ischemic. No associated bowel obstruction or perforation. Correlate with stool cultures. Other imaging findings of potential clinical significance: Cholelithiasis with no acute cholecystitis. 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 Small right inguinal hernia containing fat and fluid. Aortic Atherosclerosis  Micro Data:  COVID 19  PCR > 11/4 Neg  Flu A/B  PCR  > 11/4  Neg  Stool 11/4 > neg  C diff antigen/ toxix 11/4 > Both POS BC x 2  11/4 > MRSA PCR 11/5 > neg   Antimicrobials:  Rocephin  11/4    Cipro  11/4  Flagyl  11/4  >>> Zosyn  11/4  >>> Vanc per rectalog  tube11/5  >>>  Interim history/subjective:  No acute events overnight Remains off CRRT Seen with movement of jaw and right upper extremity to verbal stimuli  Objective   Blood pressure (!) 143/64, pulse 94, temperature 99.3 F (37.4 C), resp. rate (!) 25, height 5\' 6"  (1.676 m), weight 63.5 kg, SpO2 99 %. CVP:  [3 mmHg-14 mmHg] 7 mmHg  Vent Mode: PRVC FiO2 (%):  [40 %] 40 % Set Rate:  [18 bmp] 18 bmp Vt Set:  [510 mL] 510 mL PEEP:  [5 cmH20] 5 cmH20 Pressure Support:  [8 cmH20] 8 cmH20 Plateau Pressure:  [20 cmH20-23 cmH20] 21 cmH20   Intake/Output Summary (Last 24 hours) at 2020-07-13 0833 Last data filed at 07/13/20 0700 Gross per 24 hour  Intake 2464.6 ml  Output 607 ml  Net 1857.6 ml   Filed Weights   06/12/20 0453 06/13/20 0515 July 13, 2020 0403  Weight: 67 kg 69 kg 63.5 kg   CVP:  [3 mmHg-14 mmHg] 7 mmHg   Examination: General: Chronically ill appearing deconditioned elderly male lying in bed in no acute distress HEENT: ETT, MM pink/moist, PERRL, pupils seen with slight left to right movement but patient appears to move right upper extremity to command making likelihood for seizure low Neuro: Minimally responsive on ventilator,  did observe right upper arm movement to wife's verbal command CV: s1s2 regular rate and rhythm, no murmur, rubs, or gallops,  PULM: Clear to auscultation bilaterally, no added breath sounds, tolerating ventilator well GI: soft, bowel sounds active in all 4 quadrants, non-tender, non-distended, tolerating TF Extremities: warm/dry, 1+ pitting edema  Skin: no rashes or lesions  Resolved problems:  NAGMA  Hypocalcemia   Assessment & Plan:   Critically ill due to acute hypoxic and hypercarbic respiratory failure requiring mechanical ventilation P: Continue ventilator support with lung protective strategies  Wean PEEP and FiO2 for sats greater than 90%. Head of bed elevated  30 degrees. Plateau pressures less than 30 cm H20.  Follow intermittent chest x-ray and ABG.   SAT/SBT as tolerated, mentation preclude extubation  Ensure adequate pulmonary hygiene  Follow cultures  VAP bundle in place  PAD protocol  Septic shock secondary to C. difficile pancolitis -This is the first recurrence of C. difficile P: Patient will require 6-week vancomycin treatment course P.o. Flagyl discontinued after day 10 Titrate pressures as able Map goal greater than 65 Trend CBC and fever curve Continue stress dose steroids, wean once pressor requirement decreases Continue vancomycin-6-week course of treatment plan  Concern for new onset seizures -Patient seen with mentation change evening of 11/12 prompting code stroke initiation.  Head CT negative for acute abnormalities.  On neurology's evaluation there was concern for diffuse clonic movements. P: Neurology following, appreciate assistance Continue Keppra EEG remains negative MRI brain per neurology Correct metabolic derangements Seizure precautions  V. fib arrest -Status post defibrillation x1 -Echo with EF of 40% and global hypokinesis Intermittent atrial fibrillation -Currently in NSR P: Continue amiodarone Continue heparin drip Continuous telemetry Once stabilized may need to consider left heart cath  Acute kidney injury with anuria -No response to diuretics therefore CRRT utilized from 11/7-11/13 P: Nephrology following, appreciate assistance CRRT holiday today Trend VPAP Avoid nephrotoxins Ensure adequate renal perfusion  Transaminitis  - presumed 2/2 shock + hepatic congestion in setting profound volume overload; however, possibly exacerbated by amiodarone P: LFTs downtrending Intermittently trend CMP Avoid hepatotoxins Supportive care  Metastatic prostate cancer P: Outpatient follow-up  Oral thrush P: Nystatin swish and swallow Metastatic prostate cancer  Goals of care On the morning  update with wife at bedside discussion of goals of care was held.  Wife seemed to endorsed concern for prolonged suffering.  Brief discussion of options included comfort care health.  Patient's spouse wanted to speak with their daughter prior to any decisions being made.  It was discussed that given patient's significant comorbidities including underlying metastatic prostate cancer and coronary artery disease likely recovery remains poor.  Best practice:  Diet: TF Pain/Anxiety/Delirium protocol (if indicated): sedated on fent/ versed prn VAP protocol (if indicated): Bundle in place DVT prophylaxis: Unfractioned heparin GI prophylaxis: PPI Glucose control: Phase 1 glycemic control Mobility: bed rest Code Status: Full code Family Communication: Wife updated at bedside morning of 11/14 Disposition: ICU  CRITICAL CARE Performed by: Johnsie Cancel  Total critical care time: 39 minutes  Critical care time was exclusive of separately billable procedures and treating other patients.  Critical care was necessary to treat or prevent imminent or life-threatening deterioration.  Critical care was time spent personally by me on the following activities: development of treatment plan with patient and/or surrogate as well as nursing, discussions with consultants, evaluation of patient's response to treatment, examination of patient, obtaining history from patient or surrogate, ordering and performing treatments and interventions, ordering  and review of laboratory studies, ordering and review of radiographic studies, pulse oximetry and re-evaluation of patient's condition.  Johnsie Cancel, NP-C [facility] Contact / Pager information can be found on Amion  June 16, 2020, 9:44 AM  '

## 2020-07-01 NOTE — Consult Note (Signed)
Neurology Progress Note   S:// No new neuro events overnight.  RN at bedside. Patient is currently off CRRT. No purposeful movement or interactions.  No Seizure like activity noticed. He is on amiodarone drip, heparin drip and levophed drip. No sedation meds given    O:// Current vital signs: BP (!) 143/64 (BP Location: Other (Comment)) Comment (BP Location): aline  Pulse 87   Temp 98.8 F (37.1 C)   Resp (!) 25   Ht 5' 6" (1.676 m)   Wt 63.5 kg   SpO2 99%   BMI 22.60 kg/m  Vital signs in last 24 hours: Temp:  [98.4 F (36.9 C)-99.5 F (37.5 C)] 98.8 F (37.1 C) (11/14 1000) Pulse Rate:  [79-103] 87 (11/14 1000) Resp:  [20-32] 25 (11/14 1000) BP: (98-143)/(52-64) 143/64 (11/14 0400) SpO2:  [96 %-100 %] 99 % (11/14 1104) Arterial Line BP: (78-161)/(44-78) 144/57 (11/14 1000) FiO2 (%):  [40 %] 40 % (11/14 1104) Weight:  [63.5 kg] 63.5 kg (11/14 0403)   GENERAL: critically ill, obtunded on ventilator  HENT: - Normocephalic and atraumatic, dry mm,  Eyes: subconjunctival hemorrhage left eye LUNGS - Clear to auscultation bilaterally with no wheezes CV - S1S2 RRR, no m/r/g, equal pulses bilaterally. ABDOMEN - Soft, nontender, nondistended with normoactive BS Ext: warm, well perfused, intact peripheral pulses, 1+ edema NEURO:  Mental Status: unresponsive on vent, on no sedation  Language: unable to assess  Cranial Nerves: PERRL 2 mm/sluggish. Roving eye movements. EOMI, visual fields full, no facial asymmetry, facial sensation intact, positive cough/gag, normal sternocleidomastoid and trapezius muscle strength. No evidence of tongue atrophy or fibrillations Motor: minimal non-purposeful movement in uppers, no response to painful stimuli on lowers bilateral Tone: is normal and bulk is normal Sensation- minimal response to noxious stimuli Coordination: unable to assess  Gait- deferred   Medications  Current Facility-Administered Medications:  .  0.9 %  sodium chloride  infusion, 250 mL, Intravenous, Continuous, Tat, David, MD, Last Rate: 5 mL/hr at 06/15/2020 0700, Rate Verify at 06/15/2020 0700 .  alteplase (CATHFLO ACTIVASE) injection 2 mg, 2 mg, Intracatheter, Once PRN, Roney Jaffe, MD .  amiodarone (NEXTERONE PREMIX) 360-4.14 MG/200ML-% (1.8 mg/mL) IV infusion, 30 mg/hr, Intravenous, Continuous, Desai, Rahul P, PA-C, Last Rate: 16.67 mL/hr at June 15, 2020 0700, 30 mg/hr at 06-15-2020 0700 .  aspirin chewable tablet 162 mg, 162 mg, Per Tube, Daily, Tat, David, MD, 162 mg at 06/15/20 2563 .  B-complex with vitamin C tablet 1 tablet, 1 tablet, Per Tube, Daily, Kipp Brood, MD, 1 tablet at 06/15/2020 0813 .  chlorhexidine gluconate (MEDLINE KIT) (PERIDEX) 0.12 % solution 15 mL, 15 mL, Mouth Rinse, BID, Tat, David, MD, 15 mL at June 15, 2020 0814 .  Chlorhexidine Gluconate Cloth 2 % PADS 6 each, 6 each, Topical, Daily, Tat, David, MD, 6 each at 15-Jun-2020 [phone] .  dexmedetomidine (PRECEDEX) 400 MCG/100ML (4 mcg/mL) infusion, 0.4-1.2 mcg/kg/hr, Intravenous, Titrated, Merlene Laughter F, NP, Stopped at 06/12/20 1752 .  dextrose 10 % infusion, , Intravenous, Continuous, Agarwala, Ravi, MD, Stopping Infusion hung by another clincian at 06/07/20 2017 .  famotidine (PEPCID) IVPB 20 mg premix, 20 mg, Intravenous, Q24H, Merlene Laughter F, NP, Last Rate: 100 mL/hr at 2020-06-15 0815, 20 mg at 06/15/2020 0815 .  feeding supplement (PROSource TF) liquid 45 mL, 45 mL, Per Tube, TID, Agarwala, Ravi, MD, 45 mL at 2020-06-15 0812 .  feeding supplement (VITAL AF 1.2 CAL) liquid 1,000 mL, 1,000 mL, Per Tube, Continuous, Agarwala, Ravi, MD, Last Rate: 60 mL/hr at  06/13/20 2050, 1,000 mL at 06/13/20 2050 .  fentaNYL (SUBLIMAZE) injection 25 mcg, 25 mcg, Intravenous, Q15 min PRN, Merlene Laughter F, NP .  fentaNYL (SUBLIMAZE) injection 25-100 mcg, 25-100 mcg, Intravenous, Q30 min PRN, Merlene Laughter F, NP, 50 mcg at 06/12/20 0951 .  heparin ADULT infusion 100 units/mL (25000 units/249m sodium chloride 0.45%),  650 Units/hr, Intravenous, Continuous, AFranky Macho RPH, Last Rate: 6.5 mL/hr at 111/22/20210802, 650 Units/hr at 111-22-210802 .  heparin injection 1,000-6,000 Units, 1,000-6,000 Units, CRRT, PRN, SRoney Jaffe MD, 3,000 Units at 06/13/20 1028 .  hydrocortisone sodium succinate (SOLU-CORTEF) 100 MG injection 50 mg, 50 mg, Intravenous, Q6H, Tat, David, MD, 50 mg at 111/22/210813 .  hydroxypropyl methylcellulose / hypromellose (ISOPTO TEARS / GONIOVISC) 2.5 % ophthalmic solution 1 drop, 1 drop, Both Eyes, TID, Olalere, Adewale A, MD, 1 drop at [phone] .  insulin aspart (novoLOG) injection 1-3 Units, 1-3 Units, Subcutaneous, Q4H, AKipp Brood MD, 2 Units at 122-Nov-[id number] .  insulin aspart (novoLOG) injection 3 Units, 3 Units, Subcutaneous, Q4H, AKipp Brood MD, 3 Units at 1November 22, 20210937 .  lactobacillus acidophilus (BACID) tablet 2 tablet, 2 tablet, Per Tube, TID, DMerlene LaughterF, NP, 2 tablet at [phone].  levETIRAcetam (KEPPRA) IVPB 1500 mg/ 100 mL premix, 1,500 mg, Intravenous, Q12H, CGwinda Maine MD, Last Rate: 400 mL/hr at 12021-11-221115, 1,500 mg at 1Nov 22, 20211115 .  LORazepam (ATIVAN) injection 1 mg, 1 mg, Intravenous, Q1H PRN, DMerlene LaughterF, NP .  MEDLINE mouth rinse, 15 mL, Mouth Rinse, 10 times per day, TOrson Eva MD, 15 mL at 1Nov 22, 20210938 .  norepinephrine (LEVOPHED) 16 mg in 2519mpremix infusion, 0-40 mcg/min, Intravenous, Titrated, Tat, David, MD, Last Rate: 1.88 mL/hr at 11Nov 22, 2021811, 2 mcg/min at 11November 22, 2021811 .  nystatin (MYCOSTATIN) 100000 UNIT/ML suspension 500,000 Units, 5 mL, Oral, QID, DaMerlene Laughter, NP, 500,000 Units at 1122-Nov-2021813 .  sodium chloride 0.9 % primer fluid for CRRT, , CRRT, PRN, ScRoney JaffeMD .  sodium chloride flush (NS) 0.9 % injection 10-40 mL, 10-40 mL, Intracatheter, Q12H, Tat, David, MD, 10 mL at 11November 22, 2021939 .  sodium chloride flush (NS) 0.9 % injection 10-40 mL, 10-40 mL, Intracatheter, PRN, Tat, David, MD .   vancomycin (VANCOCIN) 50 mg/mL oral solution 125 mg, 125 mg, Per Tube, QID, 125 mg at 11Nov 22, 2021813 **FOLLOWED BY** [START ON 06/25/2020] vancomycin (VANCOCIN) 50 mg/mL oral solution 125 mg, 125 mg, Per Tube, BID **FOLLOWED BY** [START ON 07/03/2020] vancomycin (VANCOCIN) 50 mg/mL oral solution 125 mg, 125 mg, Per Tube, Daily **FOLLOWED BY** [START ON 07/10/2020] vancomycin (VANCOCIN) 50 mg/mL oral solution 125 mg, 125 mg, Per Tube, QODAY **FOLLOWED BY** [START ON 07/18/2020] vancomycin (VANCOCIN) 50 mg/mL oral solution 125 mg, 125 mg, Per Tube, Q3 days, Davis, Whitney F, NP .  vasopressin (PITRESSIN) 20 Units in sodium chloride 0.9 % 100 mL infusion-*FOR SHOCK*, 0-0.03 Units/min, Intravenous, Continuous, Tat, DaShanon BrowMD, Stopped at 06/12/20 0950 Labs CBC    Component Value Date/Time   WBC 21.8 (H) 11Nov 22, 2021359   RBC 3.37 (L) 1111-22-21359   HGB 10.5 (L) 11November 22, 2021359   HCT 31.6 (L) 1122-Nov-2021359   PLT 249 1122-Nov-2021359   MCV 93.8 11Nov 22, 2021359   MCH 31.2 1122-Nov-2021359   MCHC 33.2 11Nov 22, 2021359   RDW 16.9 (H) 112021/11/22359   LYMPHSABS 2.7 06/12/2020 2016   MONOABS 0.9 06/07/2020 2016   EOSABS 0.0 06/30/2020 2016   BASOSABS 0.1  06/01/2020 2016    CMP     Component Value Date/Time   NA 134 (L) Jun 26, 2020 0359   K 3.9 06/26/20 0359   CL 103 06/26/20 0359   CO2 15 (L) 2020-06-26 0359   GLUCOSE 193 (H) 06-26-2020 0359   BUN 91 (H) 06/26/2020 0359   CREATININE 3.14 (H) Jun 26, 2020 0359   CREATININE 1.09 09/08/2014 1504   CALCIUM 8.1 (L) 26-Jun-2020 0359   PROT 5.6 (L) 06/11/2020 0416   ALBUMIN 2.2 (L) 06-26-2020 0359   AST 351 (H) 06/11/2020 0416   ALT 708 (H) 06/11/2020 0416   ALKPHOS 494 (H) 06/11/2020 0416   BILITOT 1.3 (H) 06/11/2020 0416   GFRNONAA 19 (L) 06-26-20 0359   GFRAA >60 02/05/2020 0413    glycosylated hemoglobin  Lipid Panel     Component Value Date/Time   CHOL 155 03/01/2013 0735   TRIG 67 03/01/2013 0735   HDL 49 03/01/2013 0735    CHOLHDL 3.2 03/01/2013 0735   VLDL 13 03/01/2013 0735   LDLCALC 93 03/01/2013 0735       Assessment:  Samuel Gregory is a 79 yo male with past medical history of atrial fibrillation on Eliquis, coronary artery disease status post CABG in 2000, hypertension, hyperlipidemia, GERD, HLD,metastatic prostate cancerand history of C. difficile infection x1,who presented forevaluation of acute onset of diarrhea and arrested as w/u in progress in the  ER   VF arrest > admit to ICU with pancolitis on admit CT / vent dep/ pressor  Pending discussions with family, family possibly opting for comfort measures.  If family pursues aggressive care will order MRI Brain.  Will await to hear from CCM   Recommendations: - continue Keppra 1078m BID  - MRI Brain if family wants aggressive care  - Continue to correct metabolic derangement. - Will continue to follow.  DBeulah Gandy DNP  [facility]  1November 26, [id number]

## 2020-07-01 DEATH — deceased

## 2020-07-07 ENCOUNTER — Encounter (HOSPITAL_COMMUNITY): Payer: Self-pay

## 2020-07-07 ENCOUNTER — Ambulatory Visit (HOSPITAL_COMMUNITY): Payer: Medicare Other | Admitting: Physical Therapy

## 2020-08-30 IMAGING — XA DG FLUORO GUIDE SPINAL/SI JT INJ*L*
2 series · 2 of 2 positions shown · non-contrast
Comparison: none

CLINICAL DATA: Chronic low back and buttock pain radiating down the
back of both legs. Suspected sacroiliac joint dysfunction.

[Series 1: ortho standard · 1 of 1 slices shown (1 of 2)]
[im 1/1]
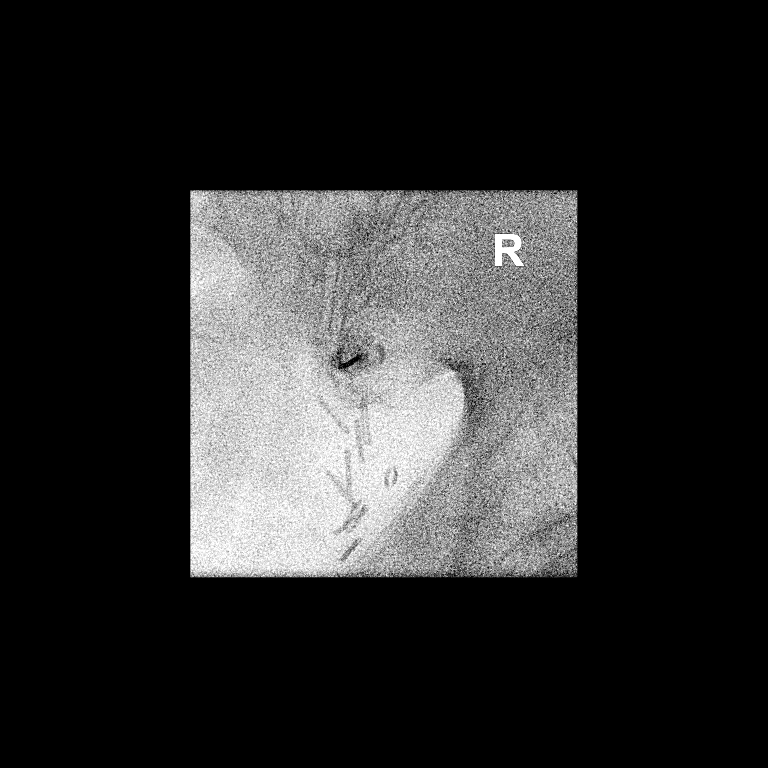

[Series 2: ortho standard · 1 of 1 slices shown (2 of 2)]
[im 1/1]
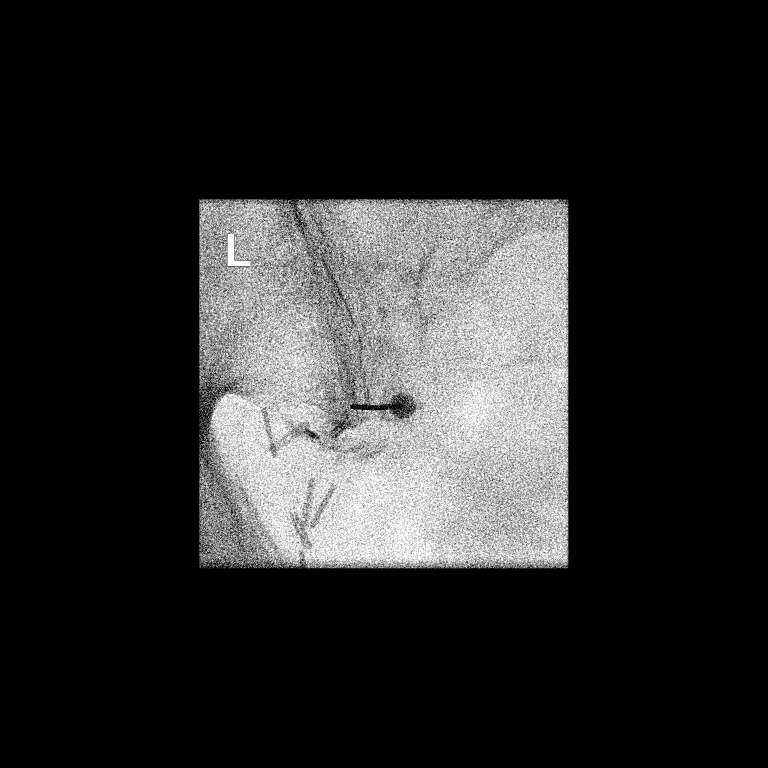

[2 of 2 positions shown; findings below may reference images not displayed]

EXAM:
FLUOROSCOPICALLY GUIDED BILATERAL SACROILIAC JOINT INJECTIONS

FLUOROSCOPY TIME:  Radiation Exposure Index (as provided by the
fluoroscopic device): 3.2 mGy

Fluoroscopy Time:  36 seconds

Number of Acquired Images:  0

PROCEDURE:
After a thorough discussion of risks and benefits of the procedure,
including bleeding, infection, injury to nerves, blood vessels, and
adjacent structures, verbal and written consent was obtained. The
patient was placed prone on the fluoroscopy table and localization
was performed over the sacrum. Target sites were marked using
fluoroscopic guidance. The skin was prepped and draped in the usual
sterile fashion using Betadine soap.

After local anesthesia with 1% lidocaine without epinephrine and
subsequent deep anesthesia, a 3.5 inch 22 gauge spinal needle was
advanced into each SI joint. Injection of 0.5 ml Isovue-M 200
confirmed intra-articular placement. No vascular uptake present.
Subsequently, 60 mg of Depo-Medrol mixed with 0.75 mL of 0.25%
bupivacaine were injected into each SI joint. Needles were removed
and a sterile dressing applied.

No complications were observed. The patient was observed and
released under the care of a driver after 30 minutes.
IMPRESSION: Successful fluoroscopically guided bilateral SI joint injections.

## 2020-10-02 ENCOUNTER — Ambulatory Visit: Payer: Medicare Other | Admitting: Cardiology

## 2020-10-31 IMAGING — US US BIOPSY CORE LIVER
1 series · 13 of 13 positions shown · non-contrast
Comparison: none

CLINICAL DATA: Inferior right hepatic lobe lesion noted on CT and
MR, new since 1381. No known primary.

[Series 1: us biopsy (liver) · 13 acquisitions, 13 frames shown]
[im 1/13]
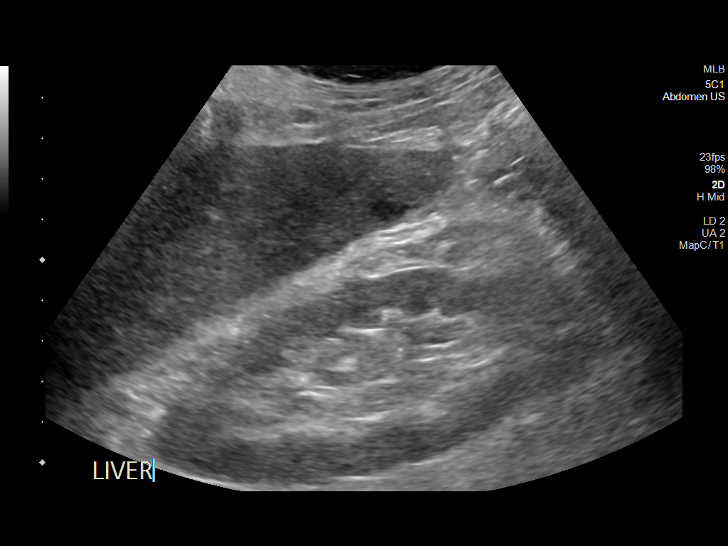
[im 2/13]
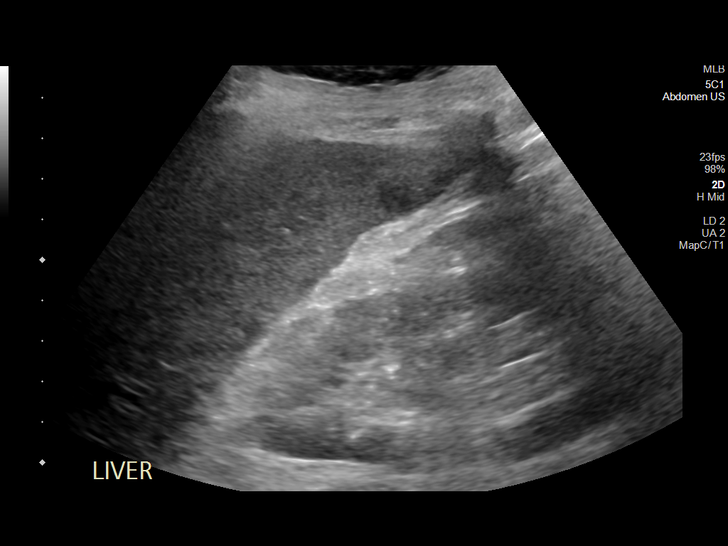
[im 3/13]
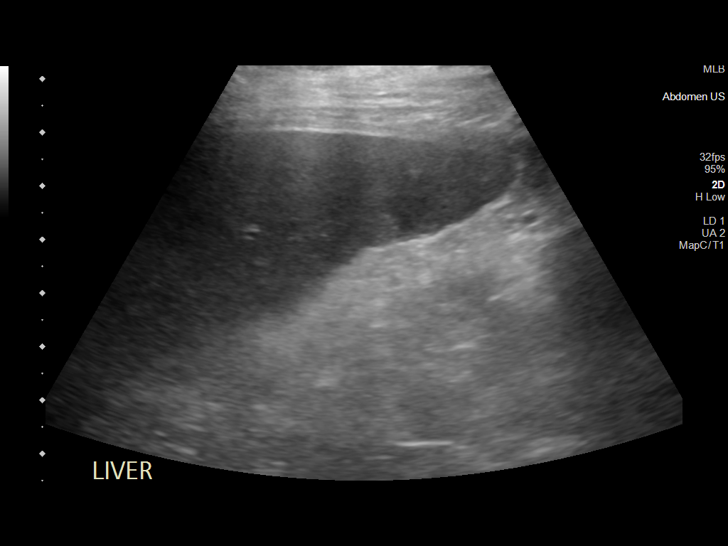
[im 4/13]
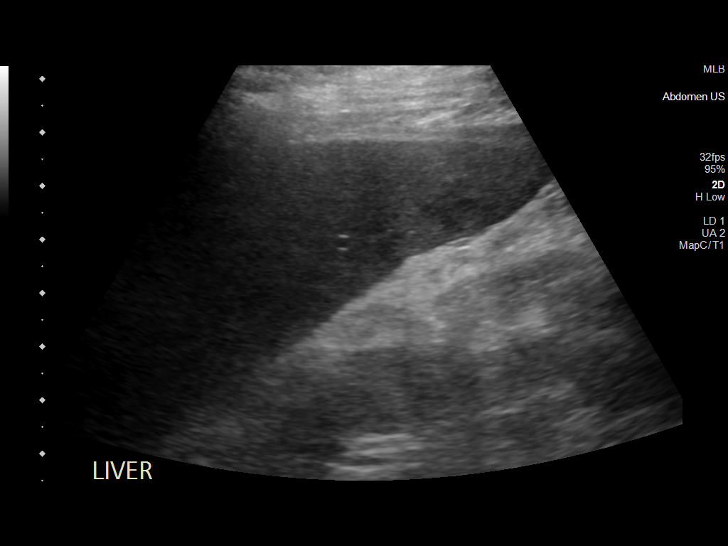
[im 5/13]
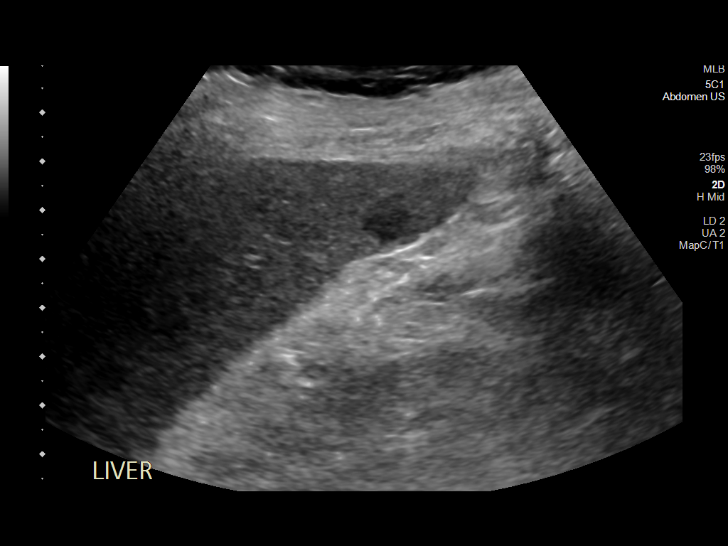
[im 6/13]
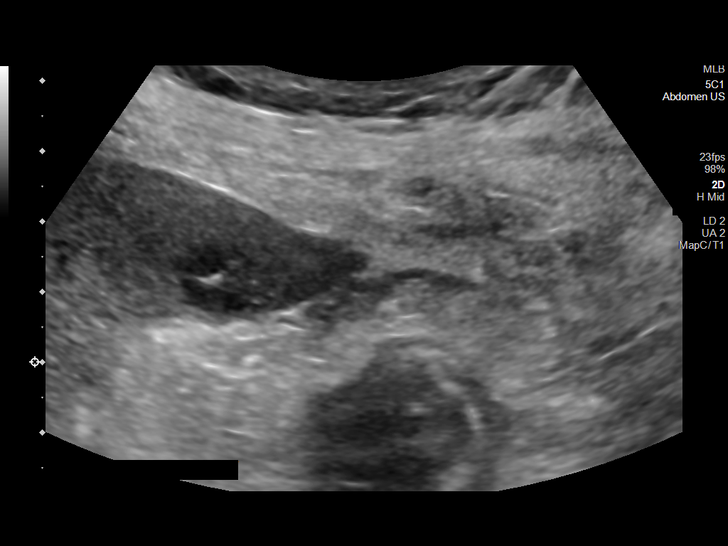
[im 7/13]
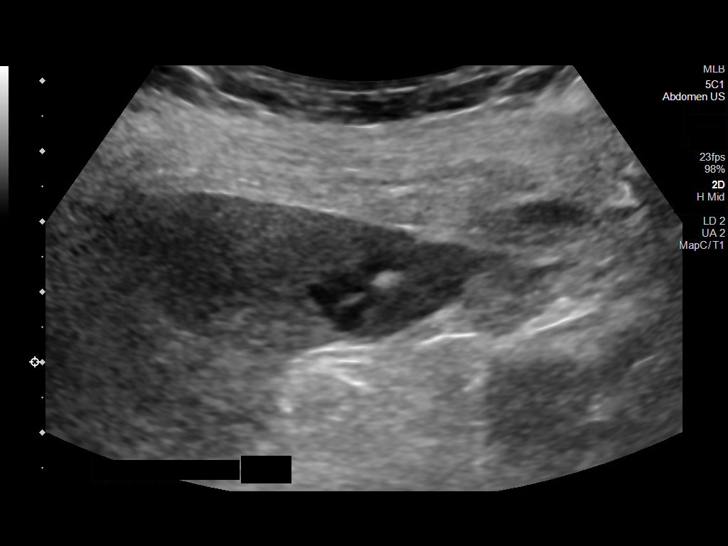
[im 8/13]
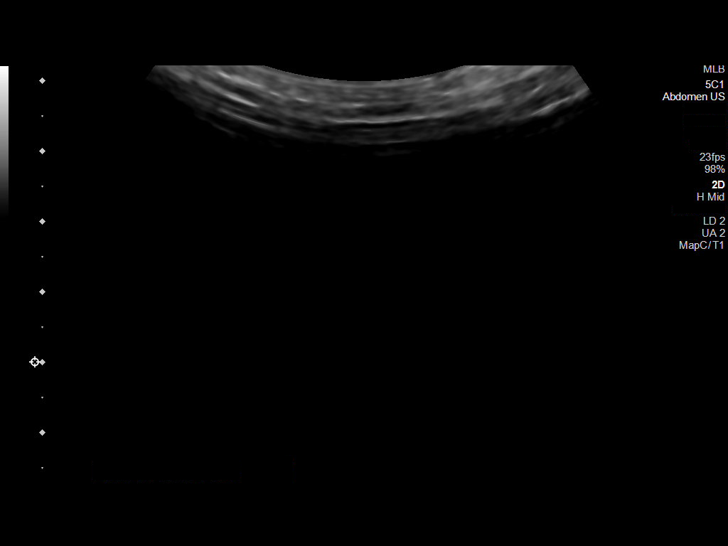
[im 9/13]
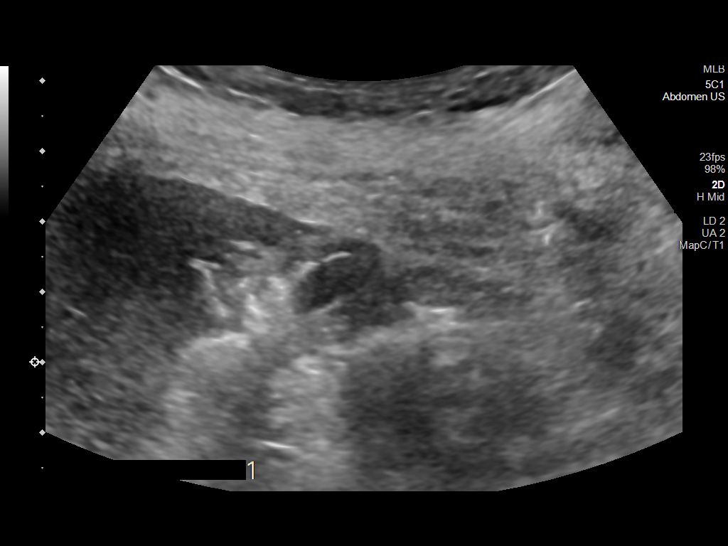
[im 10/13]
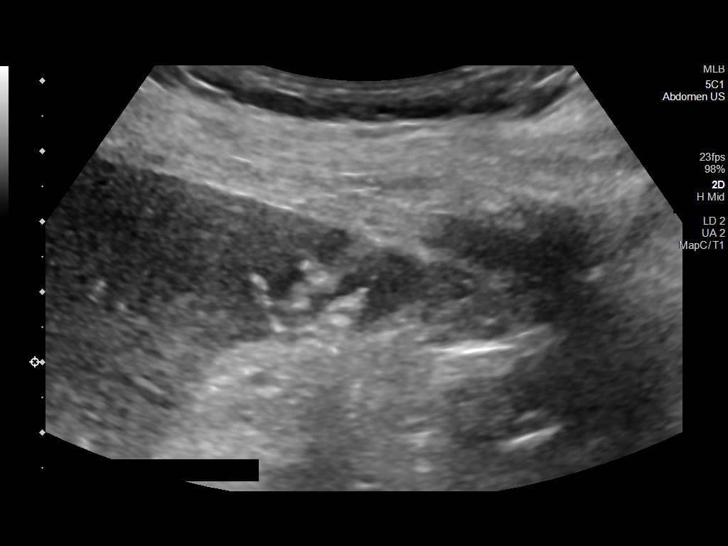
[im 11/13]
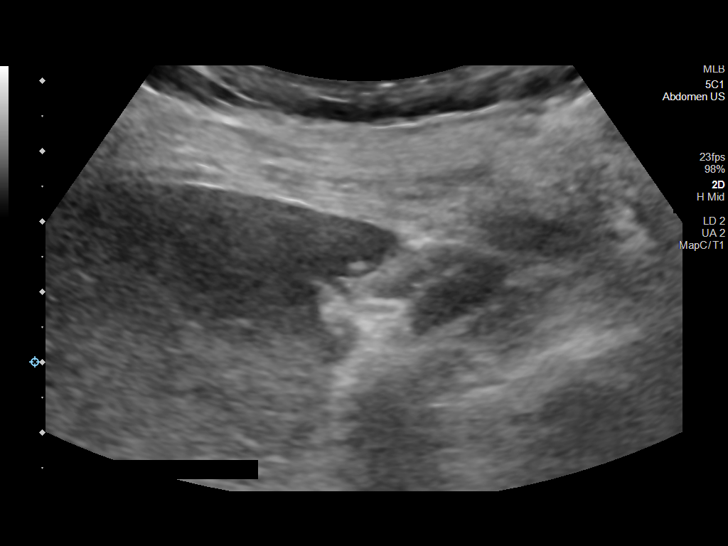
[im 12/13]
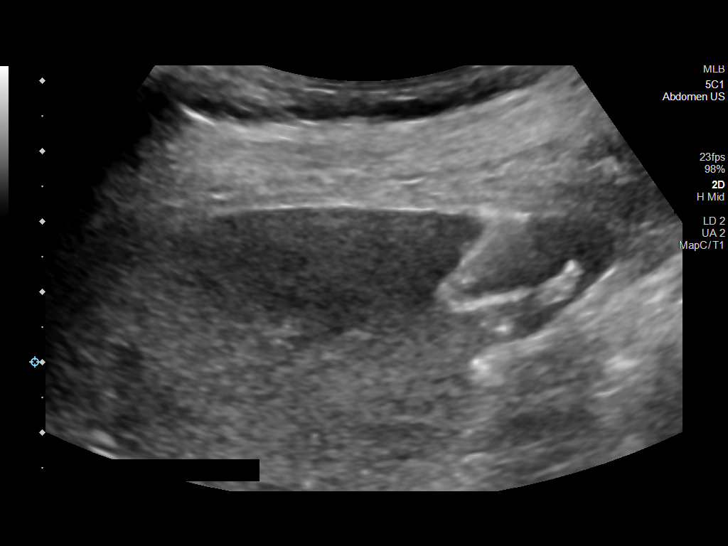
[im 13/13]
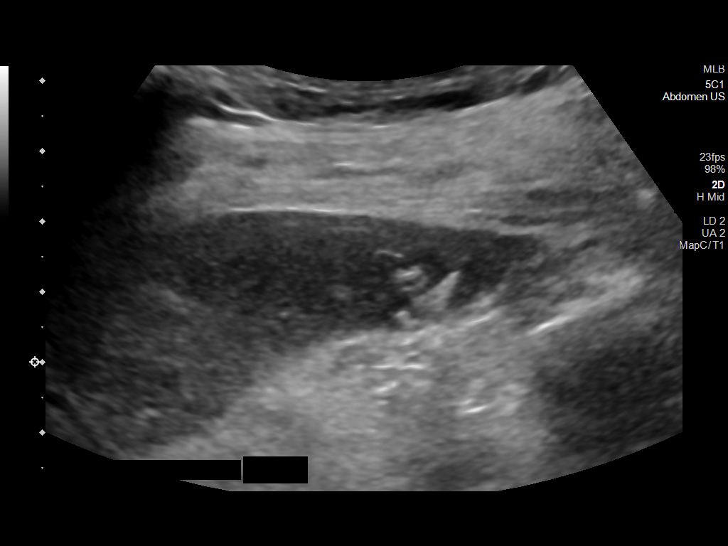

[13 of 13 positions shown; findings below may reference images not displayed]

EXAM:
ULTRASOUND GUIDED CORE BIOPSY OF LIVER LESION

MEDICATIONS:
Intravenous Fentanyl 80mcg and Versed 1mg were administered as
conscious sedation during continuous monitoring of the patient's
level of consciousness and physiological / cardiorespiratory status
by the radiology RN, with a total moderate sedation time of 17
minutes.

PROCEDURE:
The procedure, risks, benefits, and alternatives were explained to
the patient. Questions regarding the procedure were encouraged and
answered. The patient understands and consents to the procedure.

Survey ultrasound of the liver was performed. The inferior right
lobe lesion was localized and an appropriate skin entry site was
determined and marked.

The operative field was prepped with chlorhexidine in a sterile
fashion, and a sterile drape was applied covering the operative
field. A sterile gown and sterile gloves were used for the
procedure. Local anesthesia was provided with 1% Lidocaine.

Under real-time ultrasound guidance, a 21 gauge spinal needle was
initially advanced into the lesion 4 aspiration, but no fluid
returned. Subsequently, a 17 gauge trocar needle was advanced into
the lesion. No fluid could be aspirated. The needle was slightly
withdrawn for coaxial 18-gauge core biopsy samples were obtained,
submitted in formalin to surgical pathology, and in sterile saline
for tissue culture. The guide needle was removed. Postprocedure
scans show no hemorrhage or other apparent complication. The patient
tolerated the procedure well.

COMPLICATIONS:
None.
FINDINGS: Hypoechoic lesion in the inferior right lobe of the liver was
localized corresponding to CT and MR findings. Representative core
biopsies were obtained, sent for surg path and culture.
IMPRESSION: 1. Technically successful ultrasound-guided core biopsy, liver
lesion.

## 2020-11-11 ENCOUNTER — Ambulatory Visit: Payer: Medicare Other | Admitting: Urology
